# Patient Record
Sex: Female | Born: 1965
Health system: Southern US, Community
[De-identification: ages and names within clinical notes are randomized; demographics above are authoritative.]

## PROBLEM LIST (undated history)

## (undated) DIAGNOSIS — T4145XA Adverse effect of unspecified anesthetic, initial encounter: Secondary | ICD-10-CM

## (undated) DIAGNOSIS — N200 Calculus of kidney: Secondary | ICD-10-CM

## (undated) DIAGNOSIS — Z91018 Allergy to other foods: Secondary | ICD-10-CM

## (undated) DIAGNOSIS — R112 Nausea with vomiting, unspecified: Secondary | ICD-10-CM

## (undated) DIAGNOSIS — E669 Obesity, unspecified: Secondary | ICD-10-CM

## (undated) DIAGNOSIS — R011 Cardiac murmur, unspecified: Secondary | ICD-10-CM

## (undated) DIAGNOSIS — F419 Anxiety disorder, unspecified: Secondary | ICD-10-CM

## (undated) DIAGNOSIS — Z9889 Other specified postprocedural states: Secondary | ICD-10-CM

## (undated) DIAGNOSIS — K760 Fatty (change of) liver, not elsewhere classified: Secondary | ICD-10-CM

## (undated) DIAGNOSIS — T8859XA Other complications of anesthesia, initial encounter: Secondary | ICD-10-CM

## (undated) DIAGNOSIS — J45909 Unspecified asthma, uncomplicated: Secondary | ICD-10-CM

## (undated) DIAGNOSIS — T7840XA Allergy, unspecified, initial encounter: Secondary | ICD-10-CM

## (undated) DIAGNOSIS — Z9289 Personal history of other medical treatment: Secondary | ICD-10-CM

## (undated) DIAGNOSIS — Z87442 Personal history of urinary calculi: Secondary | ICD-10-CM

## (undated) DIAGNOSIS — E559 Vitamin D deficiency, unspecified: Secondary | ICD-10-CM

## (undated) DIAGNOSIS — D649 Anemia, unspecified: Secondary | ICD-10-CM

## (undated) HISTORY — DX: Obesity, unspecified: E66.9

## (undated) HISTORY — DX: Allergy, unspecified, initial encounter: T78.40XA

## (undated) HISTORY — PX: COLONOSCOPY: SHX174

## (undated) HISTORY — DX: Unspecified asthma, uncomplicated: J45.909

## (undated) HISTORY — DX: Morbid (severe) obesity due to excess calories: E66.01

## (undated) HISTORY — DX: Vitamin D deficiency, unspecified: E55.9

## (undated) HISTORY — DX: Allergy to other foods: Z91.018

## (undated) HISTORY — DX: Anxiety disorder, unspecified: F41.9

## (undated) HISTORY — DX: Anemia, unspecified: D64.9

## (undated) HISTORY — PX: POLYPECTOMY: SHX149

## (undated) HISTORY — DX: Personal history of other medical treatment: Z92.89

## (undated) HISTORY — DX: Calculus of kidney: N20.0

## (undated) HISTORY — DX: Fatty (change of) liver, not elsewhere classified: K76.0

---

## 1994-12-22 HISTORY — PX: TUBAL LIGATION: SHX77

## 2000-12-22 HISTORY — PX: OOPHORECTOMY: SHX86

## 2001-03-02 ENCOUNTER — Encounter: Admission: RE | Admit: 2001-03-02 | Discharge: 2001-05-31 | Payer: Self-pay | Admitting: Family Medicine

## 2001-09-21 ENCOUNTER — Other Ambulatory Visit: Admission: RE | Admit: 2001-09-21 | Discharge: 2001-09-21 | Payer: Self-pay | Admitting: Obstetrics and Gynecology

## 2001-11-24 ENCOUNTER — Ambulatory Visit (HOSPITAL_COMMUNITY): Admission: RE | Admit: 2001-11-24 | Discharge: 2001-11-24 | Payer: Self-pay | Admitting: Obstetrics and Gynecology

## 2001-11-24 ENCOUNTER — Encounter (INDEPENDENT_AMBULATORY_CARE_PROVIDER_SITE_OTHER): Payer: Self-pay | Admitting: Specialist

## 2003-07-11 ENCOUNTER — Other Ambulatory Visit: Admission: RE | Admit: 2003-07-11 | Discharge: 2003-07-11 | Payer: Self-pay | Admitting: Obstetrics & Gynecology

## 2003-09-29 ENCOUNTER — Encounter: Payer: Self-pay | Admitting: Emergency Medicine

## 2003-09-29 ENCOUNTER — Emergency Department (HOSPITAL_COMMUNITY): Admission: EM | Admit: 2003-09-29 | Discharge: 2003-09-29 | Payer: Self-pay | Admitting: Emergency Medicine

## 2005-12-22 DIAGNOSIS — Z9289 Personal history of other medical treatment: Secondary | ICD-10-CM

## 2005-12-22 HISTORY — DX: Personal history of other medical treatment: Z92.89

## 2007-12-29 ENCOUNTER — Other Ambulatory Visit: Admission: RE | Admit: 2007-12-29 | Discharge: 2007-12-29 | Payer: Self-pay | Admitting: Family Medicine

## 2009-04-08 ENCOUNTER — Emergency Department (HOSPITAL_COMMUNITY): Admission: EM | Admit: 2009-04-08 | Discharge: 2009-04-08 | Payer: Self-pay | Admitting: Emergency Medicine

## 2010-01-14 ENCOUNTER — Other Ambulatory Visit: Admission: RE | Admit: 2010-01-14 | Discharge: 2010-01-14 | Payer: Self-pay | Admitting: Family Medicine

## 2010-01-14 LAB — HM PAP SMEAR: HM Pap smear: NORMAL

## 2010-01-22 DIAGNOSIS — E559 Vitamin D deficiency, unspecified: Secondary | ICD-10-CM

## 2010-01-22 HISTORY — DX: Vitamin D deficiency, unspecified: E55.9

## 2011-05-09 NOTE — Op Note (Signed)
[facility]  Patient:    Vanessa Copeland, Vanessa Copeland Visit Number: [id number] MRN: [id number]          Service Type: DSU Location: [facility] Attending Physician:  Morene Antu Dictated by:   Sherry A. Rosalio Macadamia, M.D. Proc. Date: 11/24/01 Admit Date:  11/24/2001                             Operative Report  PREOPERATIVE DIAGNOSES:       1. Menorrhagia.                               2. Left ovarian cyst, dermoid.  POSTOPERATIVE DIAGNOSES:      1. Menorrhagia.                               2. Left ovarian cyst, dermoid.  PROCEDURES:                   1. Dilation and curettage.                               2. Hysteroscopy with resectoscope.                               3. Operative laparoscopy with left                                  salpingo-oophorectomy and lysis of adhesions.  SURGEON:                      Sherry A. Rosalio Macadamia, M.D.  ANESTHESIA:                   General.  INDICATIONS:                  This is a 45 year old G3, P3-0-0-3 woman who has had excessively heavy menstrual periods, causing a severe anemia.  The patient was sent by Dr. Pierce Crane for evaluation of her heavy menses.  Physical examination on September 21, 2001 revealed a nine-week-sized uterus.  Ultrasound revealed an enlarged uterus, but no distinct fibroid seen.  At the time of this ultrasound, a left dermoid cyst was noted as an incidental finding on ultrasound.  Because of the dermoid cyst and a history of menorrhagia, the patient is brought to the operating room for [facility] hysteroscopy with resectoscope and operative laparoscopy.  FINDINGS:                     A nine- to ten-week-sized, anteflexed uterus. Normal right ovary.  Left ovary with dermoid cyst present.  Fallopian tubes normal status post tubal ligation.  Normal appendix.  Multiple adhesions of the omentum to the anterior abdominal wall and in the adhesions of the anterior cul-de-sac.  DESCRIPTION OF PROCEDURE:      The patient was brought into the operating room and given adequate general anesthesia.  She was placed in the dorsal lithotomy position.  Her abdomen and then her vagina were washed with Hibiclens.  The patient was draped in a sterile fashion.  A Foley catheter was inserted into the bladder.  The patient was draped.  Pelvic examination was performed.  The surgeons gown and gloves were changed.  A speculum was placed within the vagina.  The anterior lip of the cervix was grasped with a single-tooth tenaculum.  The cervix was sounded.  The cervix was dilated with Pratt dilators to a #31.  The hysteroscope was easily introduced into the endometrial cavity.  Pictures were obtained.  Using a double-loop, right-angle resector, samples were taken circumferentially.  Adequate hemostasis was present.  There was no distinct polyp or other abnormality other than a very enlarged cavity.  Attention was then turned to laparoscopy.  The dirty drapes had been removed and the patient was draped for laparoscopy in a sterile fashion.  The surgeons gown and gloves were changed.  The subumbilical area was infiltrated with 0.25% Marcaine.  An incision was made. This was dissected down to the fascia.  The fascia was grasped with Kocher clamps.  The fascia was incised.  The edges of the fascia were then identified.  Using two 0 Vicryl sutures, figure-of-eight stitches were taken on either end of the fascia.  It was determined that this incision was inside the peritoneal cavity.  A Hasson trocar was introduced into the peritoneal cavity and it was secured in place with the 0 Vicryl sutures.  The abdominal cavity was then insufflated with carbon dioxide.  A left lateral incision was made after infiltrating with 0.25% Marcaine.  A trocar was introduced into the peritoneal cavity under direct visualization.  A suprapubic incision was made after infiltrating with Marcaine and the trocar was introduced under  direct visualization, as well.  The left fallopian tube was visualized.  The left ovary was visualized as well as all pelvic contents.  Pictures were obtained. The left ureter was identified well below the ovary and infundibulopelvic ligament.  The left fallopian tube was grasped in its isthmic ampullary portion.  It was cauterized with tripolar and then cut after double cauteries. Dissection was continued down to the ovary and was continued underneath the ovary across the infundibulopelvic ligament.  This was done using tripolar in small bites with adequate cautery and cutting.  The ovary was then separated from its attachment in this fashion.  It was placed in the cul-de-sac.  A 5 mm scope was placed in the suprapubic trocar.  The regular scope was removed.  An Endocatch was placed through the umbilical Hasson trocar.  The specimen was then placed within the Endocatch and was removed through the umbilical incision.  The bag was removed.  The bag was then opened while it was still in the abdominal cavity because it could not be removed without rupturing.  The ovarian cyst was ruptured to be able to decrease the size of the ovary.  This was ruptured well within the bag, and no contents entered the abdominal cavity. The bag, left tube and ovary were then easily removed through the umbilical incision.  The surgeons gloves were changed to decrease any risk of contamination.  The Hasson sleeve was replaced.  The original laparoscope was introduced into the abdominal cavity, removing the 5 mm scope.  The pelvis was irrigated with the Nezhat.  There was no bleeding present.  Pictures were obtained.  The upper abdomen was explored.  There were some adhesions in the anterior cul-de-sac.  These were cauterized and dissected free.  It was decided not to do any dissection of the omental adhesion, because it was felt that this was too difficult, and did not  put her at risk.  The lower trocar was  removed under direct visualization.  Adequate hemostasis was present.  The laparoscope was removed.  The Hasson sleeve was removed after all carbon  dioxide had escaped.  The fascia was then closed with the stay sutures that were figure-of-eight stitches.  These were tied.  Adequate closure of the fascia was felt to be present.  Adequate hemostasis was present.  All three incisions were then closed with 4-0 Monocryl in subcuticular stitches. Band-aids was placed over the wounds.  The Hulka tenaculum was removed from the vagina.  The patient was taken out of the dorsal lithotomy position.  She was awakened.  She was extubated.  She was moved from the operating table to a stretcher in stable condition.  COMPLICATIONS:                None.  ESTIMATED BLOOD LOSS:         Less than 5 cc.  SORBITOL DIFFERENTIAL:        -70 cc. Dictated by:   Sherry A. Rosalio Macadamia, M.D. Attending Physician:  Morene Antu DD:  11/24/01 TD:  11/24/01 Job: [phone] [id number]

## 2012-08-14 LAB — HM MAMMOGRAPHY: HM Mammogram: NORMAL

## 2012-10-06 ENCOUNTER — Institutional Professional Consult (permissible substitution): Payer: Self-pay | Admitting: Family Medicine

## 2012-10-07 ENCOUNTER — Encounter: Payer: Self-pay | Admitting: *Deleted

## 2012-10-21 ENCOUNTER — Other Ambulatory Visit: Payer: Self-pay | Admitting: Family Medicine

## 2012-10-21 ENCOUNTER — Ambulatory Visit (INDEPENDENT_AMBULATORY_CARE_PROVIDER_SITE_OTHER): Payer: BC Managed Care – PPO | Admitting: Family Medicine

## 2012-10-21 ENCOUNTER — Encounter: Payer: Self-pay | Admitting: Family Medicine

## 2012-10-21 VITALS — BP 122/82 | HR 72 | Ht 63.0 in | Wt 251.0 lb

## 2012-10-21 DIAGNOSIS — R5381 Other malaise: Secondary | ICD-10-CM

## 2012-10-21 DIAGNOSIS — R5383 Other fatigue: Secondary | ICD-10-CM

## 2012-10-21 DIAGNOSIS — E559 Vitamin D deficiency, unspecified: Secondary | ICD-10-CM

## 2012-10-21 DIAGNOSIS — D509 Iron deficiency anemia, unspecified: Secondary | ICD-10-CM

## 2012-10-21 DIAGNOSIS — Z23 Encounter for immunization: Secondary | ICD-10-CM

## 2012-10-21 DIAGNOSIS — J309 Allergic rhinitis, unspecified: Secondary | ICD-10-CM

## 2012-10-21 MED ORDER — EPINEPHRINE 0.3 MG/0.3ML IJ DEVI: 0.3000 mg | Freq: Once | INTRAMUSCULAR | Status: DC

## 2012-10-21 MED ORDER — FLUTICASONE PROPIONATE 50 MCG/ACT NA SUSP: 2.0000 | Freq: Every day | NASAL | Status: DC

## 2012-10-21 NOTE — Patient Instructions (Addendum)
Ask your spouse/children about whether or not you stop breathing at night while sleeping (and snort/startle yourself awake and start breathing again).  If this occurs, we should set you up for a sleep study.

## 2012-10-21 NOTE — Progress Notes (Signed)
Chief Complaint  Patient presents with  . get established    new pt to get established. pt is tired all the time. pt does have low iron. allergies are  giving pt a fit, has tried OTC but no relief, left foot pain for about a year and half. gave pt flu shot   Patient presents to re-establish care.  She is complaining of ongoing fatigue, a little worse over the last 1-2 months.  Allergies have been flaring.  She has tried Careers adviser, zyrtec but hasn't helped much.  Symptoms are worse first thing in the morning with a lot of congestion and sneezing.  She has been out of the flonase, and recalls it was helpful.  She would like refill.  Had labs done through her GYN earlier this year (maybe March?)--had pap smear, and had bloodwork done (iron, vitamin D).  She was supposed to be taking iron daily, but she forgets to take it.  Her periods are very heavy.  Considering uterine ablation, but she hasn't discussed this yet with her GYN.  Review of records from [place] shows last labs in 06/2010, Hgb 10.6, Vitamin D-OH 23.5  Past Medical History  Diagnosis Date  . Allergy   . Obesity, unspecified   . Asthma     mild intermittent(when allergies are flaring)  . Anemia   . Kidney stones   . H/O echocardiogram 12/2005    Trace MR, mild TR, nl L ventricular systolic function.  . Vitamin D deficiency 01/2010   Past Surgical History  Procedure Date  . Tubal ligation 1996  . Oophorectomy 2002    left due to cyst  . Cesarean section     x3   History   Social History  . Marital Status: Married    Spouse Name: N/A    Number of Children: 3  . 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 Smoking status: Never Smoker   . Smokeless tobacco: Never Used  . Alcohol Use: No  . Drug Use: No  . Sexually Active: Yes -- Female partner(s)    Birth Control/ Protection: Surgical   Other Topics Concern  . Not on file    Social History Narrative   Lives with husband, 2/3 children.  (2 daughters).  Son lives in [place].  1 dog   Family History  Problem Relation Age of Onset  . Hypertension Mother   . Anemia Mother   . Cancer Father     lung  . Kidney Stones Father   . Hypertension Father   . Asthma Brother   . Asthma Daughter   . Allergies Daughter   . Asthma Daughter   . Breast cancer Neg Hx   . Colon cancer Neg Hx   . Diabetes Neg Hx   . Heart disease Neg Hx    Meds: iron once daily, zyrtec daily Allergies  Allergen Reactions  . Shellfish Allergy Shortness Of Breath   ROS:  Denies fevers, cough, shortness of breath, headaches, dizziness, nausea, vomiting, bowel changes, urinary complaints. +heavy menses.  Denies joint pains, numbness, tingling, weakness, depression, anxiety, bleeding, bruising, skin rashes or other concerns.  PHYSICAL EXAM: BP 122/82  Pulse 72  Ht 5\' 3"  (1.6 m)  Wt 251 lb (113.853 kg)  BMI 44.46 kg/m2 Well developed, pleasant, obese female in no distress HEENT:  PERRL, EOMI, conjunctiva clear.  TM's and EAC's normal.  Nasal mucosa  with mod edema, pale, clear mucus.  OP clear.  Sinuses nontender Neck: no lymphadenopathy, thyromegaly Heart: regular rate and rhythm without murmur Lungs: clear bilaterally Back: no spine or CVA tenderness Extemities: no edema, 2+ pulse Skin: no rash Psych: normal mood, affect, hygiene and grooming  ASSESSMENT/PLAN: 1. Other malaise and fatigue  CBC with Differential, Vitamin D 25 hydroxy, Comprehensive metabolic panel, TSH  2. Need for prophylactic vaccination and inoculation against influenza  Flu vaccine greater than or equal to 3yo preservative free IM  3. Allergic rhinitis, cause unspecified  fluticasone (FLONASE) 50 MCG/ACT nasal spray  4. Unspecified vitamin D deficiency  Vitamin D 25 hydroxy  5. Iron deficiency anemia, unspecified  CBC with Differential, Ferritin, Iron   Allergies--restart flonase.  Reviewed proper use, and to  overlap with oral antihistamine.  Discussed sleep apnea. She is to ask her family if they ever hear her stop breathing.  If so, call us and we can set up a sleep study.  At this point, there are many other treatable reasons for her fatigue, but wouldn't want to miss the possibility of sleep apnea.  Anemia--iron deficient, due to heavy cycles. Reasonable to consider ablation if unable to treat adequately with iron supplementation. Check labs today

## 2012-10-22 DIAGNOSIS — J309 Allergic rhinitis, unspecified: Secondary | ICD-10-CM | POA: Insufficient documentation

## 2012-10-22 LAB — CBC WITH DIFFERENTIAL/PLATELET
Basophils Absolute: 0 10*3/uL (ref 0.0–0.1)
HCT: 31.9 % — ABNORMAL LOW (ref 36.0–46.0)
Lymphocytes Relative: 43 % (ref 12–46)
Monocytes Absolute: 0.5 10*3/uL (ref 0.1–1.0)
Neutro Abs: 3.4 10*3/uL (ref 1.7–7.7)
Neutrophils Relative %: 47 % (ref 43–77)
RDW: 16 % — ABNORMAL HIGH (ref 11.5–15.5)
WBC: 7.1 10*3/uL (ref 4.0–10.5)

## 2012-10-22 LAB — COMPREHENSIVE METABOLIC PANEL
ALT: 12 U/L (ref 0–35)
AST: 14 U/L (ref 0–37)
Albumin: 4.1 g/dL (ref 3.5–5.2)
Alkaline Phosphatase: 69 U/L (ref 39–117)
Glucose, Bld: 79 mg/dL (ref 70–99)
Potassium: 4.4 mEq/L (ref 3.5–5.3)
Sodium: 138 mEq/L (ref 135–145)
Total Protein: 7.2 g/dL (ref 6.0–8.3)

## 2012-10-22 LAB — VITAMIN D 25 HYDROXY (VIT D DEFICIENCY, FRACTURES): Vit D, 25-Hydroxy: 28 ng/mL — ABNORMAL LOW (ref 30–89)

## 2012-10-22 LAB — FOLATE: Folate: 13.8 ng/mL

## 2013-01-13 ENCOUNTER — Encounter: Payer: Self-pay | Admitting: Family Medicine

## 2013-01-13 ENCOUNTER — Ambulatory Visit (INDEPENDENT_AMBULATORY_CARE_PROVIDER_SITE_OTHER): Payer: BC Managed Care – PPO | Admitting: Family Medicine

## 2013-01-13 VITALS — BP 124/80 | HR 72 | Temp 98.0°F | Ht 63.0 in | Wt 244.0 lb

## 2013-01-13 DIAGNOSIS — R109 Unspecified abdominal pain: Secondary | ICD-10-CM

## 2013-01-13 MED ORDER — HYOSCYAMINE SULFATE 0.125 MG SL SUBL: 0.1250 mg | SUBLINGUAL_TABLET | SUBLINGUAL | Status: DC | PRN

## 2013-01-13 NOTE — Progress Notes (Signed)
Chief Complaint  Patient presents with  . Abdominal Pain    stomach cramping after eating certain foods, so painful she wants to cry. Can hear a gurgling before the pain.   HPI: About 10-15 minutes after eating, she develops abdominal pain, across her lower abdomen, described as crampy.  Pain is severe enough that sometimes she will cry. She will hear a loud noise in L abdomen, then she has a bowel movement, that is usually loose, and pain is reduced afterwards.  Takes about an hour for pain to completely resolve.  She can't pinpoint any particular food that causes it.  It happens more frequently in the evenings (after dinner), but sometimes has happened at work after lunch.  She is currently doing a 21 day fast for Church, just eating vegetables and fruit.  Hasn't been having dairy.  Stools have been a little loose since being on the fast.  Fast ends this weekend.  She reports having problems like this once a week for years, but just worse recently.  Still isn't having pain daily, but is more severe than usual.  She recalls being given an anti-spasmodic in the past that was somewhat helpful.  Past Medical History  Diagnosis Date  . Allergy   . Obesity, unspecified   . Asthma     mild intermittent(when allergies are flaring)  . Anemia   . Kidney stones   . H/O echocardiogram 12/2005    Trace MR, mild TR, nl L ventricular systolic function.  . Vitamin D deficiency 01/2010   Past Surgical History  Procedure Date  . Tubal ligation 1996  . Oophorectomy 2002    left due to cyst  . Cesarean section     x3   History   Social History  . Marital Status: Married    Spouse Name: N/A    Number of Children: 3  . 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 Smoking status: Never Smoker   . Smokeless tobacco: Never Used  . Alcohol Use: No  . Drug Use: No  . Sexually Active: Yes -- Female partner(s)     Birth Control/ Protection: Surgical   Other Topics Concern  . Not on file   Social History Narrative   Lives with husband, 2/3 children.  (2 daughters).  Son lives in [place].  1 dog    Current outpatient prescriptions:fluticasone (FLONASE) 50 MCG/ACT nasal spray, Place 2 sprays into the nose daily., Disp: 16 g, Rfl: 6;  ibuprofen (ADVIL,MOTRIN) 800 MG tablet, Take 800 mg by mouth every 8 (eight) hours as needed., Disp: , Rfl: ;  loratadine (CLARITIN) 10 MG tablet, Take 10 mg by mouth daily., Disp: , Rfl: ;  EPINEPHrine (EPI-PEN) 0.3 mg/0.3 mL DEVI, Inject 0.3 mLs (0.3 mg total) into the muscle once., Disp: 2 Device, Rfl: 0 ferrous sulfate 325 (65 FE) MG tablet, Take 325 mg by mouth daily with breakfast., Disp: , Rfl:   Allergies  Allergen Reactions  . Shellfish Allergy Shortness Of Breath   ROS:  Denies fevers, URI symptoms, chest pain, shortness of breath, nausea, vomiting, heartburn.  Denies dysuria, vaginal discharge.  No skin rashes or other concerns.  PHYSICAL EXAM: BP 124/80  Pulse 72  Temp 98 F (36.7 C)  Ht 5\' 3"  (1.6 m)  Wt 244 lb (110.678 kg)  BMI 43.22 kg/m2  LMP 01/08/2013 Well developed, slightly anxious female, in no distress  Heart: regular rate and rhythm without murmur Lungs: clear bilaterally Abdomen: soft, nontender,  Normal bowel sounds.  No hepatosplenomegaly.  Negative Murphy sign.  Completely nontender. Extremities: no edema Skin: no rashes Neuro: alert and oriented.  Cranial nerves grossly intact, normal gait  ASSESSMENT/PLAN:  1. Abdominal pain  hyoscyamine (LEVSIN/SL) 0.125 MG SL tablet    Keep food journal in order to see if there are particular triggers for pain, which can be avoided.  It doesn't appear to be related to dairy or lactose as you haven't been having that recently, but it potentially could get worse once you reintroduce it--if that occurs, than back off on the dairy again.   Pain sounds like it is related to gas, possibly worse due to  increased fruit/vegetable/fiber intake.  Try using Beano before dinner, and simethicone (Gas-X) as needed for the severe pain.  Can also re-try the anti-spasmodic since it was previously helpful.  Discussed differential diagnosis--IBS, gas.  Doubt related to gallbladder or reflux given where pain is, and normal exam. Discussed diverticulitis, and if pain persists, consider referring to GI for evaluation, and colonoscopy.  Return if worsening pain, blood in stool, fevers, or other concerns.

## 2013-01-13 NOTE — Patient Instructions (Signed)
  Keep food journal in order to see if there are particular triggers for pain, which can be avoided.  It doesn't appear to be related to dairy or lactose as you haven't been having that recently, but it potentially could get worse once you reintroduce it--if that occurs, than back off on the dairy again.   Pain sounds like it is related to gas, possibly worse due to increased fruit/vegetable/fiber intake.  Try using Beano before dinner, and simethicone (Gas-X) as needed for the severe pain.  Can also re-try the anti-spasmodic since it was previously helpful.  If pain persists, consider referring to GI for evaluation, and colonoscopy.  Return if worsening pain, blood in stool, fevers, or other concerns.

## 2013-10-26 LAB — HM MAMMOGRAPHY: HM Mammogram: NEGATIVE

## 2013-10-27 ENCOUNTER — Encounter: Payer: Self-pay | Admitting: Internal Medicine

## 2014-10-04 DIAGNOSIS — D500 Iron deficiency anemia secondary to blood loss (chronic): Secondary | ICD-10-CM | POA: Insufficient documentation

## 2014-10-23 ENCOUNTER — Encounter: Payer: Self-pay | Admitting: Family Medicine

## 2016-01-30 ENCOUNTER — Ambulatory Visit (INDEPENDENT_AMBULATORY_CARE_PROVIDER_SITE_OTHER): Payer: BLUE CROSS/BLUE SHIELD | Admitting: Family Medicine

## 2016-01-30 ENCOUNTER — Encounter: Payer: Self-pay | Admitting: Family Medicine

## 2016-01-30 VITALS — BP 122/80 | HR 68 | Temp 98.2°F | Wt 254.4 lb

## 2016-01-30 DIAGNOSIS — H04209 Unspecified epiphora, unspecified lacrimal gland: Secondary | ICD-10-CM | POA: Diagnosis not present

## 2016-01-30 DIAGNOSIS — Z7189 Other specified counseling: Secondary | ICD-10-CM | POA: Diagnosis not present

## 2016-01-30 DIAGNOSIS — Z7689 Persons encountering health services in other specified circumstances: Secondary | ICD-10-CM

## 2016-01-30 DIAGNOSIS — R067 Sneezing: Secondary | ICD-10-CM | POA: Diagnosis not present

## 2016-01-30 DIAGNOSIS — J069 Acute upper respiratory infection, unspecified: Secondary | ICD-10-CM | POA: Diagnosis not present

## 2016-01-30 NOTE — Progress Notes (Signed)
Subjective:  Vanessa Copeland is a 50 y.o. female who presents for possible sinus infection.  Symptoms include a 3 day history of scratchy throat, eye drainage, left ear itching and clogged, sinus pressure, sneezing, cough,  tender and swollen neck glands.  Denies fever, chills, body aches, nausea, vomiting. No recent antibiotic use. Has underlying allergies, seasonal. Has not been taking allergy medicine.  History of asthma, mild and intermittent. Triggers are seasonal allergies and colds usually. Has albuterol inhaler. Has not used it in a months.   Past history is significant for asthma. Patient is a non-smoker.  Using advil sinus for symptoms.  Denies sick contacts.  No other aggravating or relieving factors.  No other c/o.  ROS as in subjective   Objective: Filed Vitals:   01/30/16 1612  BP: 122/80  Pulse: 68  Temp: 98.2 F (36.8 C)    General appearance: Alert, WD/WN, no distress                             Skin: warm, no rash                           Head: no sinus tenderness,                            Eyes: conjunctiva normal, corneas clear, PERRLA                            Ears: pearly TMs, external ear canals normal                          Nose: septum midline, turbinates swollen, with erythema and clear discharge             Mouth/throat: MMM, tongue normal, mild pharyngeal erythema                           Neck: supple, no adenopathy, no thyromegaly, nontender                          Heart: RRR, normal S1, S2, no murmurs                         Lungs: CTA bilaterally, no wheezes, rales, or rhonchi      Assessment and Plan:  Acute URI  Encounter to establish care  Sneezing with watery eyes  Discussed that she appears to have an acute URI with a viral etiology at this point. Sinus infection is not possible but not high on the list of etiologies. Also suspect that her underlying allergies are making symptoms worse. Recommend that she start taking Claritin and Flonase  daily.  Can use OTC Mucinex for congestion.  Tylenol or Ibuprofen OTC for fever and malaise.  Discussed symptomatic relief, nasal saline flush, and saltwater gargles for throat discomfort . She will call or return if worse or not improving in 2-3 days.

## 2016-03-17 ENCOUNTER — Encounter: Payer: Self-pay | Admitting: Family Medicine

## 2016-03-17 ENCOUNTER — Ambulatory Visit (INDEPENDENT_AMBULATORY_CARE_PROVIDER_SITE_OTHER): Payer: BLUE CROSS/BLUE SHIELD | Admitting: Family Medicine

## 2016-03-17 VITALS — BP 122/82 | HR 64 | Temp 98.1°F | Wt 251.0 lb

## 2016-03-17 DIAGNOSIS — J309 Allergic rhinitis, unspecified: Secondary | ICD-10-CM | POA: Diagnosis not present

## 2016-03-17 DIAGNOSIS — J069 Acute upper respiratory infection, unspecified: Secondary | ICD-10-CM

## 2016-03-17 NOTE — Progress Notes (Signed)
Subjective:  Vanessa Copeland is a 50 y.o. female who presents for possible sinus infection.  Symptoms include 4 day history of scratchy throat, headache, generalized body aches, sneezing, runny nose and nasal congestion.  Denies fever, ear pain, sore throat, cough, nausea, vomiting, diarrhea.   Past history is significant for asthma- has not needed inhaler. Patient is a non-smoker.  Using Mucinex DM, claritin, Flonase for symptoms.  Denies sick contacts.  No other aggravating or relieving factors.  Reports underlying allergies. No recent antibiotic use.  Has mirena for birth control.  ROS as in subjective   Objective: Filed Vitals:   03/17/16 1113  BP: 122/82  Pulse: 64  Temp: 98.1 F (36.7 C)    General appearance: Alert, WD/WN, no distress                             Skin: warm, no rash                           Head: no sinus tenderness,                            Eyes: conjunctiva normal, corneas clear, PERRLA                            Ears: pearly TMs, external ear canals normal                          Nose: septum midline, turbinates swollen, with erythema, deep red and clear discharge             Mouth/throat: MMM, tongue normal, mild pharyngeal erythema without edema or exudate                           Neck: supple, no adenopathy, no thyromegaly, nontender                          Heart: RRR, normal S1, S2, no murmurs                         Lungs: CTA bilaterally, no wheezes, rales, or rhonchi      Assessment and Plan:   Acute URI  Allergic rhinitis, unspecified allergic rhinitis type   Discussed that her symptoms appear to be related to a viral etiology and that her allergies seem to be exacerbating this. Recommend taking daily claritin and flonase.   Can use OTC Mucinex for congestion.  Tylenol or Ibuprofen OTC for fever and malaise.  Discussed symptomatic relief, nasal saline flush, using humidifier at night. She will call or return if worse or not improving in 2-3  days. If her symptoms worsen she will call and will treat for sinusitis.

## 2016-03-17 NOTE — Patient Instructions (Addendum)
I think your symptoms are related to a viral issue and complicated by your underlying allergies. Treat your symptoms and stay well hydrated and if you're not feeling better in the next 2-3 days give me a call.  Continue treating your allergies with claritin and flonase. Use saline nasal spray and humidifier.

## 2016-06-23 ENCOUNTER — Ambulatory Visit (INDEPENDENT_AMBULATORY_CARE_PROVIDER_SITE_OTHER): Payer: BLUE CROSS/BLUE SHIELD | Admitting: Family Medicine

## 2016-06-23 ENCOUNTER — Encounter: Payer: Self-pay | Admitting: Family Medicine

## 2016-06-23 VITALS — BP 118/78 | HR 64 | Wt 245.2 lb

## 2016-06-23 DIAGNOSIS — J029 Acute pharyngitis, unspecified: Secondary | ICD-10-CM

## 2016-06-23 LAB — POCT RAPID STREP A (OFFICE): RAPID STREP A SCREEN: NEGATIVE

## 2016-06-23 MED ORDER — IBUPROFEN 800 MG PO TABS: 800.0000 mg | ORAL_TABLET | Freq: Three times a day (TID) | ORAL | Status: DC | PRN

## 2016-06-23 NOTE — Patient Instructions (Addendum)
Try using salt water gargles 3-4 times a day, taking Tylenol or ibuprofen for sore throat, stay well hydrated and if you are not turning the corner in 3-4 days give me a call.  Pharyngitis Pharyngitis is redness, pain, and swelling (inflammation) of your pharynx.  CAUSES  Pharyngitis is usually caused by infection. Most of the time, these infections are from viruses (viral) and are part of a cold. However, sometimes pharyngitis is caused by bacteria (bacterial). Pharyngitis can also be caused by allergies. Viral pharyngitis may be spread from person to person by coughing, sneezing, and personal items or utensils (cups, forks, spoons, toothbrushes). Bacterial pharyngitis may be spread from person to person by more intimate contact, such as kissing.  SIGNS AND SYMPTOMS  Symptoms of pharyngitis include:   Sore throat.   Tiredness (fatigue).   Low-grade fever.   Headache.  Joint pain and muscle aches.  Skin rashes.  Swollen lymph nodes.  Plaque-like film on throat or tonsils (often seen with bacterial pharyngitis). DIAGNOSIS  Your health care provider will ask you questions about your illness and your symptoms. Your medical history, along with a physical exam, is often all that is needed to diagnose pharyngitis. Sometimes, a rapid strep test is done. Other lab tests may also be done, depending on the suspected cause.  TREATMENT  Viral pharyngitis will usually get better in 3-4 days without the use of medicine. Bacterial pharyngitis is treated with medicines that kill germs (antibiotics).  HOME CARE INSTRUCTIONS   Drink enough water and fluids to keep your urine clear or pale yellow.   Only take over-the-counter or prescription medicines as directed by your health care provider:   If you are prescribed antibiotics, make sure you finish them even if you start to feel better.   Do not take aspirin.   Get lots of rest.   Gargle with 8 oz of salt water ( tsp of salt per 1 qt  of water) as often as every 1-2 hours to soothe your throat.   Throat lozenges (if you are not at risk for choking) or sprays may be used to soothe your throat. SEEK MEDICAL CARE IF:   You have large, tender lumps in your neck.  You have a rash.  You cough up green, yellow-brown, or bloody spit. SEEK IMMEDIATE MEDICAL CARE IF:   Your neck becomes stiff.  You drool or are unable to swallow liquids.  You vomit or are unable to keep medicines or liquids down.  You have severe pain that does not go away with the use of recommended medicines.  You have trouble breathing (not caused by a stuffy nose). MAKE SURE YOU:   Understand these instructions.  Will watch your condition.  Will get help right away if you are not doing well or get worse.   This information is not intended to replace advice given to you by your health care provider. Make sure you discuss any questions you have with your health care provider.   Document Released: 12/08/2005 Document Revised: 09/28/2013 Document Reviewed: 08/15/2013 Elsevier Interactive Patient Education [employer].

## 2016-06-23 NOTE — Progress Notes (Signed)
Subjective:  Vanessa Copeland is a 50 y.o. female who presents for evaluation of 5 day history of sore throat.  She has not had a recent close exposure to someone with proven streptococcal pharyngitis but [place] the beach with [place].  Associated symptoms include dry cough, runny nose.  History of seasonal allergies but is not taking allergy medication.  Denies fever, chills, nausea, vomiting. Does not smoke. No recent antibiotic use.   Treatment to date: theraflu and ibuprofen.  ? sick contacts.  No other aggravating or relieving factors.  No other c/o.  The following portions of the patient's history were reviewed and updated as appropriate: allergies, current medications, past medical history, past social history, past surgical history and problem list.  ROS as in subjective   Objective: Filed Vitals:   06/23/16 1542  BP: 118/78  Pulse: 64    General appearance: no distress, WD/WN, is not ill-appearing HEENT: normocephalic, conjunctiva/corneas normal, sclerae anicteric, nares patent, clear discharge, erythema and edema, pharynx with erythema, without exudate.  Oral cavity: MMM, no lesions  Neck: supple, no lymphadenopathy, no thyromegaly Heart: RRR, normal S1, S2, no murmurs Lungs: CTA bilaterally, no wheezes, rhonchi, or rales    Laboratory Strep test done. Results:negative.    Assessment and Plan: Acute pharyngitis, unspecified etiology - Plan: ibuprofen (ADVIL,MOTRIN) 800 MG tablet  Sore throat - Plan: POCT rapid strep A   Advised that symptoms and exam suggest a viral etiology.  Discussed symptomatic treatment including salt water gargles, warm fluids, rest, hydrate well, can use Tylenol or Ibuprofen for throat pain, fever, or malaise. Ibuprofen 800 mg prescribed. Start taking allergy medication again and see if allergies are playing a role. If worse or not improving within 2-3 days, call or return.

## 2016-08-01 ENCOUNTER — Encounter (HOSPITAL_COMMUNITY): Payer: Self-pay | Admitting: *Deleted

## 2016-08-01 ENCOUNTER — Ambulatory Visit (HOSPITAL_COMMUNITY)
Admission: EM | Admit: 2016-08-01 | Discharge: 2016-08-01 | Disposition: A | Discharge status: Home / Self Care | Payer: BLUE CROSS/BLUE SHIELD | Attending: Family Medicine | Admitting: Family Medicine

## 2016-08-01 DIAGNOSIS — T148XXA Other injury of unspecified body region, initial encounter: Principal | ICD-10-CM

## 2016-08-01 DIAGNOSIS — T148 Other injury of unspecified body region: Secondary | ICD-10-CM | POA: Diagnosis not present

## 2016-08-01 DIAGNOSIS — X503XXA Overexertion from repetitive movements, initial encounter: Secondary | ICD-10-CM

## 2016-08-01 MED ORDER — KETOROLAC TROMETHAMINE 60 MG/2ML IM SOLN
60.0000 mg | Freq: Once | INTRAMUSCULAR | Status: AC
  Administered 2016-08-01: 60 mg via INTRAMUSCULAR

## 2016-08-01 MED ORDER — NAPROXEN 500 MG PO TABS: 500.0000 mg | ORAL_TABLET | Freq: Two times a day (BID) | ORAL | 0 refills | Status: DC

## 2016-08-01 MED ORDER — KETOROLAC TROMETHAMINE 60 MG/2ML IM SOLN
INTRAMUSCULAR | Status: AC
  Filled 2016-08-01: qty 2

## 2016-08-01 NOTE — ED Provider Notes (Signed)
CSN: [id number]     Arrival date & time 08/01/16  1629 History   First MD Initiated Contact with Patient 08/01/16 1642     Chief Complaint  Patient presents with  . Leg Pain   (Consider location/radiation/quality/duration/timing/severity/associated sxs/prior Treatment) HPI Patient reports that she has been increasing her exercise regimen 5x per week for the last 3 months.  She has been doing interval training.  She notes that the LLE aches at night time.  She reports that sometimes she gets pain while driving.  She describes pain as a nagging, aching pain.  She reports that pain starts at her upper thigh and radiates down the side of her leg down to her calf but does not involve her foot.  She reports that pain happens with activity and at rest.  Not associated with any particular activity or movement.  No h/o injury to her back or LLE.  No numbness, tingling.  She reports some sensation of her LLE wanting to give out.  Denies falls.  She reports that she sits at work.  Has taken Motrin 800 mg at bedtime with minimal relief of pain.  No use of topicals.  Not using heat/ ice.  Past Medical History:  Diagnosis Date  . Allergy   . Anemia   . Asthma    mild intermittent(when allergies are flaring)  . H/O echocardiogram 12/2005   Trace MR, mild TR, nl L ventricular systolic function.  . Kidney stones   . Obesity, unspecified   . Vitamin D deficiency 01/2010   Past Surgical History:  Procedure Laterality Date  . CESAREAN SECTION     x3  . OOPHORECTOMY  2002   left due to cyst  . TUBAL LIGATION  1996   Family History  Problem Relation Age of Onset  . Hypertension Mother   . Anemia Mother   . Cancer Father     lung  . Kidney Stones Father   . Hypertension Father   . Asthma Brother   . Asthma Daughter   . Allergies Daughter   . Asthma Daughter   . Breast cancer Neg Hx   . Colon cancer Neg Hx   . Diabetes Neg Hx   . Heart disease Neg Hx    Social History  Substance Use Topics  .  Smoking status: Never Smoker  . Smokeless tobacco: Never Used  . Alcohol use No   OB History    Gravida Para Term Preterm AB Living   3 3       3    SAB TAB Ectopic Multiple Live Births                 Review of Systems  Musculoskeletal: Positive for arthralgias and myalgias. Negative for back pain, gait problem and joint swelling.  Neurological: Positive for weakness (reports occ buckling of LLE). Negative for numbness.    Allergies  Shellfish allergy  Home Medications   Prior to Admission medications   Medication Sig Start Date End Date Taking? Authorizing Provider  EPINEPHrine (EPI-PEN) 0.3 mg/0.3 mL DEVI Inject 0.3 mLs (0.3 mg total) into the muscle once. 10/21/12   Rita Ohara, MD  fluticasone (FLONASE) 50 MCG/ACT nasal spray Place 2 sprays into the nose daily. 10/21/12   Rita Ohara, MD  ibuprofen (ADVIL,MOTRIN) 800 MG tablet Take 1 tablet (800 mg total) by mouth every 8 (eight) hours as needed. 06/23/16   Girtha Rm, NP  loratadine (CLARITIN) 10 MG tablet Take 10 mg by mouth  daily. Reported on 01/30/2016    Historical Provider, MD  naproxen (NAPROSYN) 500 MG tablet Take 1 tablet (500 mg total) by mouth 2 (two) times daily with a meal. 08/01/16   Janora Norlander, DO   Meds Ordered and Administered this Visit   Medications  ketorolac (TORADOL) injection 60 mg (not administered)    BP 132/78 (BP Location: Right Arm)   Pulse 78   Temp 98.6 F (37 C) (Oral)   Resp 18   LMP  (Approximate)   SpO2 100%  No data found.  Physical Exam  Constitutional: She is oriented to person, place, and time. She appears well-developed and well-nourished. No distress.  obese  Neck: Normal range of motion.  Cardiovascular: Normal rate.   Pulmonary/Chest: Effort normal.  Musculoskeletal: Normal range of motion. She exhibits no edema, tenderness or deformity.  Patient has full painless ROM of spine and LE.  5/5 LE strength.  No spinal midline TTP, no paraspinal TTP.  No TTP to greater  trochanter or left IT band.  Negative straight leg raise. Negative FADIR.  Negative FABER.  Normal gait.  Neurological: She is alert and oriented to person, place, and time.  Light touch sensation LE grossly in tact.  Skin: Skin is warm and dry. Capillary refill takes less than 2 seconds.  Psychiatric: She has a normal mood and affect. Her behavior is normal.  Nursing note and vitals reviewed.   Urgent Care Course   Clinical Course    Procedures (including critical care time)  Labs Review Labs Reviewed - No data to display  Imaging Review No results found.   MDM   1. Overuse injury     Vanessa Copeland is a 50 y.o. female that presents for nonspecific LLE pain.  Physical exam was nonfocal.  She likely has an overuse injury in the setting of interval training.  Could consider a stress fracture as a DDx.  Though HPI not entirely consistent with this.  Discussed discontinuing physical activity for now.  Recommend rest, Naproxen, ICE.  Toradol IM administered in clinic.  Recommend that patient be seen by ortho as well.  Patient wishes to be seen ASAP and will attempt to be seen at [facility] during their UC hours.  Return precautions reviewed.  Meds ordered this encounter  Medications  . ketorolac (TORADOL) injection 60 mg  . naproxen (NAPROSYN) 500 MG tablet    Sig: Take 1 tablet (500 mg total) by mouth 2 (two) times daily with a meal.    Dispense:  14 tablet    Refill:  0   This patient was discussed with my attending, Dr Juventino Slovak, who agrees with my assessment and plan.  Ericia Moxley M. Lajuana Ripple, DO PGY-3, [place], DO 08/01/16 1725

## 2016-08-01 NOTE — ED Triage Notes (Signed)
Pt  Reports  She  Has  Been  excercising     For  sev  Weeks  And  She  Has  Noticed  Pain in her  l  Leg   From    Top  Of  Leg  Down  To  Bottom  With  Symptoms  Not releived  By otc meds   No   pe  Risk factors  Such  As  Chest pain or  Shortness  Of  Breath    Pt is  Sitting  Upright on the  Exam table  Speaking in  Complete  sentances    In no  Acute  Distress

## 2016-08-01 NOTE — Discharge Instructions (Signed)
I think you have an overuse injury secondary to you intense interval training.  Discontinue Motrin.  I have prescribed Naproxen to take twice daily with food instead.  I recommend that you see an orthopedist for further evaluation.

## 2016-08-12 DIAGNOSIS — M25562 Pain in left knee: Secondary | ICD-10-CM | POA: Diagnosis not present

## 2016-08-16 DIAGNOSIS — M25562 Pain in left knee: Secondary | ICD-10-CM | POA: Diagnosis not present

## 2016-08-19 DIAGNOSIS — M25562 Pain in left knee: Secondary | ICD-10-CM | POA: Diagnosis not present

## 2016-08-21 DIAGNOSIS — M25562 Pain in left knee: Secondary | ICD-10-CM | POA: Diagnosis not present

## 2016-08-21 DIAGNOSIS — M94262 Chondromalacia, left knee: Secondary | ICD-10-CM | POA: Diagnosis not present

## 2016-10-17 ENCOUNTER — Encounter (HOSPITAL_COMMUNITY): Payer: Self-pay | Admitting: Emergency Medicine

## 2016-10-17 ENCOUNTER — Ambulatory Visit (HOSPITAL_COMMUNITY)
Admission: EM | Admit: 2016-10-17 | Discharge: 2016-10-17 | Disposition: A | Discharge status: Home / Self Care | Payer: BLUE CROSS/BLUE SHIELD | Attending: Internal Medicine | Admitting: Internal Medicine

## 2016-10-17 DIAGNOSIS — R42 Dizziness and giddiness: Secondary | ICD-10-CM | POA: Diagnosis not present

## 2016-10-17 LAB — GLUCOSE, CAPILLARY: GLUCOSE-CAPILLARY: 86 mg/dL (ref 65–99)

## 2016-10-17 MED ORDER — ACETAMINOPHEN 325 MG PO TABS
ORAL_TABLET | ORAL | Status: AC
  Filled 2016-10-17: qty 3

## 2016-10-17 MED ORDER — ACETAMINOPHEN 325 MG PO TABS
975.0000 mg | ORAL_TABLET | Freq: Once | ORAL | Status: AC
  Administered 2016-10-17: 975 mg via ORAL

## 2016-10-17 MED ORDER — MECLIZINE HCL 25 MG PO TABS: 25.0000 mg | ORAL_TABLET | Freq: Three times a day (TID) | ORAL | 0 refills | Status: DC | PRN

## 2016-10-17 NOTE — ED Triage Notes (Signed)
PT reports she was doing warm ups for a gym class yesterday when (after a position change) she experienced severe dizziness. Pt reports the room was spinning and she fell over. PT has had residual room spinning sensation when she moves too quickly.

## 2016-10-17 NOTE — ED Provider Notes (Cosign Needed)
Dictation #1 [id number]  [id number] CSN: [id number]     Arrival date & time 10/17/16  1826 History   First MD Initiated Contact with Patient 10/17/16 1932     Chief Complaint  Patient presents with  . Dizziness   (Consider location/radiation/quality/duration/timing/severity/associated sxs/prior Treatment) HPI  TERRE LESAR is a 50 y.o. female presenting to UC with c/o severe dizziness that started yesterday after performing warm-ups for a gym class she has been attending 4 days a week for several weeks.  Pt states she felt like the room was spinning, she eventually lost her balance and fell over. Dizziness is still present today but not as severe. Dizziness is brought on by sudden position changes or head movements. She reports mild nausea but no vomiting. Mild generalized headache.  No prior hx of similar symptoms. No recent URI symptoms of cough or congestion. She has been eating and drinking well. Denies chest pain, SOB or palpitations.    Past Medical History:  Diagnosis Date  . Allergy   . Anemia   . Asthma    mild intermittent(when allergies are flaring)  . H/O echocardiogram 12/2005   Trace MR, mild TR, nl L ventricular systolic function.  . Kidney stones   . Obesity, unspecified   . Vitamin D deficiency 01/2010   Past Surgical History:  Procedure Laterality Date  . CESAREAN SECTION     x3  . OOPHORECTOMY  2002   left due to cyst  . TUBAL LIGATION  1996   Family History  Problem Relation Age of Onset  . Hypertension Mother   . Anemia Mother   . Cancer Father     lung  . Kidney Stones Father   . Hypertension Father   . Asthma Brother   . Asthma Daughter   . Allergies Daughter   . Asthma Daughter   . Breast cancer Neg Hx   . Colon cancer Neg Hx   . Diabetes Neg Hx   . Heart disease Neg Hx    Social History  Substance Use Topics  . Smoking status: Never Smoker  . Smokeless tobacco: Never Used  . Alcohol use No   OB History    Gravida Para Term  Preterm AB Living   3 3       3    SAB TAB Ectopic Multiple Live Births                 Review of Systems  Constitutional: Negative for chills and fever.  Eyes: Negative for photophobia, pain and visual disturbance.  Respiratory: Negative for cough, chest tightness and shortness of breath.   Cardiovascular: Negative for chest pain, palpitations and leg swelling.  Gastrointestinal: Negative for abdominal pain, diarrhea, nausea and vomiting.  Musculoskeletal: Positive for gait problem (when she stands too quickly). Negative for arthralgias, myalgias, neck pain and neck stiffness.  Skin: Negative for color change and rash.  Neurological: Positive for dizziness and headaches. Negative for syncope, weakness, light-headedness and numbness.    Allergies  Shellfish allergy  Home Medications   Prior to Admission medications   Medication Sig Start Date End Date Taking? Authorizing Provider  EPINEPHrine (EPI-PEN) 0.3 mg/0.3 mL DEVI Inject 0.3 mLs (0.3 mg total) into the muscle once. 10/21/12   Rita Ohara, MD  fluticasone (FLONASE) 50 MCG/ACT nasal spray Place 2 sprays into the nose daily. 10/21/12   Rita Ohara, MD  ibuprofen (ADVIL,MOTRIN) 800 MG tablet Take 1 tablet (800 mg total) by mouth every 8 (eight) hours as  needed. 06/23/16   Girtha Rm, NP  loratadine (CLARITIN) 10 MG tablet Take 10 mg by mouth daily. Reported on 01/30/2016    Historical Provider, MD  meclizine (ANTIVERT) 25 MG tablet Take 1 tablet (25 mg total) by mouth 3 (three) times daily as needed for dizziness. 10/17/16   Noland Fordyce, PA-C  naproxen (NAPROSYN) 500 MG tablet Take 1 tablet (500 mg total) by mouth 2 (two) times daily with a meal. 08/01/16   Janora Norlander, DO   Meds Ordered and Administered this Visit   Medications  acetaminophen (TYLENOL) tablet 975 mg (975 mg Oral Given 10/17/16 1951)    BP 139/85   Pulse 78   Temp 98.2 F (36.8 C) (Oral)   Resp 16   Ht 5\' 3"  (1.6 m)   Wt 230 lb (104.3 kg)   SpO2  100%   BMI 40.74 kg/m  No data found.   Physical Exam  Constitutional: She is oriented to person, place, and time. She appears well-developed and well-nourished. No distress.  HENT:  Head: Normocephalic and atraumatic.  Right Ear: Tympanic membrane normal.  Left Ear: Tympanic membrane normal.  Nose: Nose normal. Right sinus exhibits no maxillary sinus tenderness and no frontal sinus tenderness. Left sinus exhibits no maxillary sinus tenderness and no frontal sinus tenderness.  Mouth/Throat: Uvula is midline, oropharynx is clear and moist and mucous membranes are normal.  Eyes: Pupils are equal, round, and reactive to light. Right eye exhibits nystagmus. Left eye exhibits nystagmus.  Mild horizontal nystagmus to the right.  Neck: Normal range of motion. Neck supple.  Cardiovascular: Normal rate and regular rhythm.   Pulmonary/Chest: Effort normal. No stridor. No respiratory distress. She has no wheezes. She has no rales.  Abdominal: Soft. She exhibits no distension. There is no tenderness.  Musculoskeletal: Normal range of motion.  Lymphadenopathy:    She has no cervical adenopathy.  Neurological: She is alert and oriented to person, place, and time.  No facial droop. Symmetric smile. Speech is clear. Alert to person, place and time. Normal finger to nose coordination. Normal gait on tip-tops. Normal gait.   Skin: Skin is warm and dry. She is not diaphoretic.  Psychiatric: She has a normal mood and affect. Her behavior is normal.  Nursing note and vitals reviewed.   Urgent Care Course   Clinical Course    Procedures (including critical care time)  Labs Review Labs Reviewed  GLUCOSE, CAPILLARY    Imaging Review No results found.   MDM   1. Vertigo   2. Dizziness    Pt c/o dizziness, worse with head movement. Mild horizontal nystagmus to the Right. Normal CBG Normal orthostatic vitals.  Symptoms likely due to BPPV. Doubt SAH or CVA at this time.  No indication for  imaging at this time. Rx: Meclizine. Encouraged to rest and stay well hydrated. F/u with PCP next week if not improving. Discussed symptoms that warrant emergent care in the ED including severe headache, change in vision, passing out, one-sided, numbness or weakness, or other new concerning symptoms develop.  Patient verbalized understanding and agreement with treatment plan.      Noland Fordyce, PA-C 10/17/16 2045

## 2016-10-21 ENCOUNTER — Encounter: Payer: Self-pay | Admitting: Family Medicine

## 2016-10-21 ENCOUNTER — Encounter: Payer: Self-pay | Admitting: Gastroenterology

## 2016-10-21 ENCOUNTER — Ambulatory Visit (INDEPENDENT_AMBULATORY_CARE_PROVIDER_SITE_OTHER): Payer: BLUE CROSS/BLUE SHIELD | Admitting: Family Medicine

## 2016-10-21 VITALS — BP 150/80 | HR 70 | Resp 18 | Ht 63.0 in | Wt 233.8 lb

## 2016-10-21 DIAGNOSIS — Z23 Encounter for immunization: Secondary | ICD-10-CM

## 2016-10-21 DIAGNOSIS — Z1239 Encounter for other screening for malignant neoplasm of breast: Secondary | ICD-10-CM

## 2016-10-21 DIAGNOSIS — Z113 Encounter for screening for infections with a predominantly sexual mode of transmission: Secondary | ICD-10-CM | POA: Diagnosis not present

## 2016-10-21 DIAGNOSIS — E559 Vitamin D deficiency, unspecified: Secondary | ICD-10-CM | POA: Diagnosis not present

## 2016-10-21 DIAGNOSIS — Z13220 Encounter for screening for lipoid disorders: Secondary | ICD-10-CM

## 2016-10-21 DIAGNOSIS — Z1211 Encounter for screening for malignant neoplasm of colon: Secondary | ICD-10-CM

## 2016-10-21 DIAGNOSIS — Z1231 Encounter for screening mammogram for malignant neoplasm of breast: Secondary | ICD-10-CM

## 2016-10-21 DIAGNOSIS — Z0000 Encounter for general adult medical examination without abnormal findings: Secondary | ICD-10-CM | POA: Diagnosis not present

## 2016-10-21 DIAGNOSIS — D649 Anemia, unspecified: Secondary | ICD-10-CM | POA: Insufficient documentation

## 2016-10-21 DIAGNOSIS — Z8371 Family history of colonic polyps: Secondary | ICD-10-CM

## 2016-10-21 HISTORY — DX: Encounter for screening for infections with a predominantly sexual mode of transmission: Z11.3

## 2016-10-21 NOTE — Patient Instructions (Signed)
Please take your prescription to [place] and have them fax [place] your lab results. Take the order for mammogram to [facility].  The GI office will call you to schedule an appointment to discuss your screening colonoscopy. Continue with healthy diet and exercise. Follow-up pending labs or in 1 year.  Preventative Care for Adults - Female      MAINTAIN REGULAR HEALTH EXAMS:  A routine yearly physical is a good way to check in with your primary care provider about your health and preventive screening. It is also an opportunity to share updates about your health and any concerns you have, and receive a thorough all-over exam.   Most health insurance companies pay for at least some preventative services.  Check with your health plan for specific coverages.  WHAT PREVENTATIVE SERVICES DO WOMEN NEED?  Adult women should have their weight and blood pressure checked regularly.   Women age 31 and older should have their cholesterol levels checked regularly.  Women should be screened for cervical cancer with a Pap smear and pelvic exam beginning at either age 34, or 3 years after they become sexually activity.    Breast cancer screening generally begins at age 39 with a mammogram and breast exam by your primary care provider.    Beginning at age 61 and continuing to age 1, women should be screened for colorectal cancer.  Certain people may need continued testing until age 40.  Updating vaccinations is part of preventative care.  Vaccinations help protect against diseases such as the flu.  Osteoporosis is a disease in which the bones lose minerals and strength as we age. Women ages 71 and over should discuss this with their caregivers, as should women after menopause who have other risk factors.  Lab tests are generally done as part of preventative care to screen for anemia and blood disorders, to screen for problems with the kidneys and liver, to screen for bladder problems, to check blood sugar, and to  check your cholesterol level.  Preventative services generally include counseling about diet, exercise, avoiding tobacco, drugs, excessive alcohol consumption, and sexually transmitted infections.    GENERAL RECOMMENDATIONS FOR GOOD HEALTH:  Healthy diet:  Eat a variety of foods, including fruit, vegetables, animal or vegetable protein, such as meat, fish, chicken, and eggs, or beans, lentils, tofu, and grains, such as rice.  Drink plenty of water daily.  Decrease saturated fat in the diet, avoid lots of red meat, processed foods, sweets, fast foods, and fried foods.  Exercise:  Aerobic exercise helps maintain good heart health. At least 30-40 minutes of moderate-intensity exercise is recommended. For example, a brisk walk that increases your heart rate and breathing. This should be done on most days of the week.   Find a type of exercise or a variety of exercises that you enjoy so that it becomes a part of your daily life.  Examples are running, walking, swimming, water aerobics, and biking.  For motivation and support, explore group exercise such as aerobic class, spin class, Zumba, Yoga,or  martial arts, etc.    Set exercise goals for yourself, such as a certain weight goal, walk or run in a race such as a 5k walk/run.  Speak to your primary care provider about exercise goals.  Disease prevention:  If you smoke or chew tobacco, find out from your caregiver how to quit. It can literally save your life, no matter how long you have been a tobacco user. If you do not use tobacco, never  begin.   Maintain a healthy diet and normal weight. Increased weight leads to problems with blood pressure and diabetes.   The Body Mass Index or BMI is a way of measuring how much of your body is fat. Having a BMI above 27 increases the risk of heart disease, diabetes, hypertension, stroke and other problems related to obesity. Your caregiver can help determine your BMI and based on it develop an exercise  and dietary program to help you achieve or maintain this important measurement at a healthful level.  High blood pressure causes heart and blood vessel problems.  Persistent high blood pressure should be treated with medicine if weight loss and exercise do not work.   Fat and cholesterol leaves deposits in your arteries that can block them. This causes heart disease and vessel disease elsewhere in your body.  If your cholesterol is found to be high, or if you have heart disease or certain other medical conditions, then you may need to have your cholesterol monitored frequently and be treated with medication.   Ask if you should have a cardiac stress test if your history suggests this. A stress test is a test done on a treadmill that looks for heart disease. This test can find disease prior to there being a problem.  Menopause can be associated with physical symptoms and risks. Hormone replacement therapy is available to decrease these. You should talk to your caregiver about whether starting or continuing to take hormones is right for you.   Osteoporosis is a disease in which the bones lose minerals and strength as we age. This can result in serious bone fractures. Risk of osteoporosis can be identified using a bone density scan. Women ages 34 and over should discuss this with their caregivers, as should women after menopause who have other risk factors. Ask your caregiver whether you should be taking a calcium supplement and Vitamin D, to reduce the rate of osteoporosis.   Avoid drinking alcohol in excess (more than two drinks per day).  Avoid use of street drugs. Do not share needles with anyone. Ask for professional help if you need assistance or instructions on stopping the use of alcohol, cigarettes, and/or drugs.  Brush your teeth twice a day with fluoride toothpaste, and floss once a day. Good oral hygiene prevents tooth decay and gum disease. The problems can be painful, unattractive, and can  cause other health problems. Visit your dentist for a routine oral and dental check up and preventive care every 6-12 months.   Look at your skin regularly.  Use a mirror to look at your back. Notify your caregivers of changes in moles, especially if there are changes in shapes, colors, a size larger than a pencil eraser, an irregular border, or development of new moles.  Safety:  Use seatbelts 100% of the time, whether driving or as a passenger.  Use safety devices such as hearing protection if you work in environments with loud noise or significant background noise.  Use safety glasses when doing any work that could send debris in to the eyes.  Use a helmet if you ride a bike or motorcycle.  Use appropriate safety gear for contact sports.  Talk to your caregiver about gun safety.  Use sunscreen with a SPF (or skin protection factor) of 15 or greater.  Lighter skinned people are at a greater risk of skin cancer. Don't forget to also wear sunglasses in order to protect your eyes from too much damaging sunlight. Damaging sunlight  can accelerate cataract formation.   Practice safe sex. Use condoms. Condoms are used for birth control and to help reduce the spread of sexually transmitted infections (or STIs).  Some of the STIs are gonorrhea (the clap), chlamydia, syphilis, trichomonas, herpes, HPV (human papilloma virus) and HIV (human immunodeficiency virus) which causes AIDS. The herpes, HIV and HPV are viral illnesses that have no cure. These can result in disability, cancer and death.   Keep carbon monoxide and smoke detectors in your home functioning at all times. Change the batteries every 6 months or use a model that plugs into the wall.   Vaccinations:  Stay up to date with your tetanus shots and other required immunizations. You should have a booster for tetanus every 10 years. Be sure to get your flu shot every year, since 5%-20% of the [place] population comes down with the flu. The flu vaccine  changes each year, so being vaccinated once is not enough. Get your shot in the fall, before the flu season peaks.   Other vaccines to consider:  Human Papilloma Virus or HPV causes cancer of the cervix, and other infections that can be transmitted from person to person. There is a vaccine for HPV, and females should get immunized between the ages of 7 and 78. It requires a series of 3 shots.   Pneumococcal vaccine to protect against certain types of pneumonia.  This is normally recommended for adults age 13 or older.  However, adults younger than 49 years old with certain underlying conditions such as diabetes, heart or lung disease should also receive the vaccine.  Shingles vaccine to protect against Varicella Zoster if you are older than age 4, or younger than 49 years old with certain underlying illness.  Hepatitis A vaccine to protect against a form of infection of the liver by a virus acquired from food.  Hepatitis B vaccine to protect against a form of infection of the liver by a virus acquired from blood or body fluids, particularly if you work in health care.  If you plan to travel internationally, check with your local health department for specific vaccination recommendations.  Cancer Screening:  Breast cancer screening is essential to preventive care for women. All women age 19 and older should perform a breast self-exam every month. At age 91 and older, women should have their caregiver complete a breast exam each year. Women at ages 28 and older should have a mammogram (x-ray film) of the breasts. Your caregiver can discuss how often you need mammograms.    Cervical cancer screening includes taking a Pap smear (sample of cells examined under a microscope) from the cervix (end of the uterus). It also includes testing for HPV (Human Papilloma Virus, which can cause cervical cancer). Screening and a pelvic exam should begin at age 22, or 3 years after a woman becomes sexually active.  Screening should occur every year, with a Pap smear but no HPV testing, up to age 70. After age 47, you should have a Pap smear every 3 years with HPV testing, if no HPV was found previously.   Most routine colon cancer screening begins at the age of 42. On a yearly basis, doctors may provide special easy to use take-home tests to check for hidden blood in the stool. Sigmoidoscopy or colonoscopy can detect the earliest forms of colon cancer and is life saving. These tests use a small camera at the end of a tube to directly examine the colon. Speak to your caregiver  about this at age 32, when routine screening begins (and is repeated every 5 years unless early forms of pre-cancerous polyps or small growths are found).

## 2016-10-21 NOTE — Progress Notes (Signed)
Subjective:    Patient ID: Vanessa Copeland, female    DOB: 02/15/1966, 50 y.o.   MRN: [id number]  HPI Chief Complaint  Patient presents with  . Annual Exam    pt needs PAP- has Mirena. pt with concerns of dizziness in past week. Has been dizzy since Thursday. Seen at urgent care and reports dx with Vertigo. pt states her EPI pen is expired.    She is here for a complete physical exam. No concerns or complaints today. She was recently seen in urgent care for episode of dizziness with position changes and diagnosed with vertigo. She was prescribed meclizine states she did take 2 doses of this. Dizziness has subsided.   Last CPE: 2016 in [place].   Other providers: [facility]- for mammograms  Past medical history: vitamin D deficiency and is not currently taking vitamin D supplement. History of anemia and is not taking iron. She is aware that her BMI places her in a morbidly obese category. She has made lifestyle changes with diet and exercise. Surgeries: c-sections   Social history: Lives with husband and children, works at [employer]: has cut back sugar and carbohydrates.  Excerise: interval training  Immunizations: flu shot- today, tdap unknown will get records.   Health maintenance:  Mammogram: early 2016.  Colonoscopy: never, mother with colonic polyps  Last Gynecological Exam: 2016, mirena inserted. Never  Last Menstrual cycle: none since Mirena  Pregnancies: 3 all C-sections Last Dental Exam: annually- is overdue Last Eye Exam: annually   Wears seatbelt always, uses sunscreen, smoke detectors in home and functioning, does not text while driving and feels safe in home environment.   Reviewed allergies, medications, past medical, surgical, family, and social history.    Review of Systems Review of Systems Constitutional: -fever, -chills, -sweats, -unexpected weight change,-fatigue ENT: -runny nose, -ear pain, -sore throat Cardiology:  -chest pain, -palpitations,  -edema Respiratory: -cough, -shortness of breath, -wheezing Gastroenterology: -abdominal pain, -nausea, -vomiting, -diarrhea, -constipation  Hematology: -bleeding or bruising problems Musculoskeletal: -arthralgias, -myalgias, -joint swelling, -back pain Ophthalmology: -vision changes Urology: -dysuria, -difficulty urinating, -hematuria, -urinary frequency, -urgency Neurology: -headache, -weakness, -tingling, -numbness       Objective:   Physical Exam BP (!) 150/80   Pulse 70   Resp 18   Ht 5\' 3"  (1.6 m)   Wt 233 lb 12.8 oz (106.1 kg)   SpO2 99%   BMI 41.42 kg/m   General Appearance:    Alert, cooperative, no distress, appears stated age  Head:    Normocephalic, without obvious abnormality, atraumatic  Eyes:    PERRL, conjunctiva/corneas clear, EOM's intact, fundi    benign  Ears:    Normal TM's and external ear canals  Nose:   Nares normal, mucosa normal, no drainage or sinus   tenderness  Throat:   Lips, mucosa, and tongue normal; teeth and gums normal  Neck:   Supple, no lymphadenopathy;  thyroid:  no   enlargement/tenderness/nodules; no carotid   bruit or JVD  Back:    Spine nontender, no curvature, ROM normal, no CVA     tenderness  Lungs:     Clear to auscultation bilaterally without wheezes, rales or     ronchi; respirations unlabored  Chest Wall:    No tenderness or deformity   Heart:    Regular rate and rhythm, S1 and S2 normal, no murmur, rub   or gallop  Breast Exam:    No tenderness, masses, or nipple discharge or inversion.  No axillary lymphadenopathy  Abdomen:     Soft, non-tender, nondistended, normoactive bowel sounds,    no masses, no hepatosplenomegaly  Genitalia:    Normal external genitalia without lesions.  BUS and vagina normal; cervix without lesions, or cervical motion tenderness. No abnormal vaginal discharge.  Uterus and adnexa not enlarged, nontender, no masses.  Pap not performed, she will be due next year. Chaperone present.   Rectal:    Not  done. Referral made to GI for colonoscopy   Extremities:   No clubbing, cyanosis or edema  Pulses:   2+ and symmetric all extremities  Skin:   Skin color, texture, turgor normal, no rashes or lesions  Lymph nodes:   Cervical, supraclavicular, and axillary nodes normal  Neurologic:   CNII-XII intact, normal strength, sensation and gait; reflexes 2+ and symmetric throughout          Psych:   Normal mood, affect, hygiene and grooming.    Urinalysis dipstick: unable to do in office today due to controls out of date. This will be a send out.      Assessment & Plan:  Annual physical exam - Plan: Visual acuity screening, CANCELED: Urinalysis Dipstick, CANCELED: POCT urinalysis dipstick  Morbid obesity ([place])  Family history of colonic polyps - Plan: Ambulatory referral to Gastroenterology  Special screening for malignant neoplasms, colon - Plan: Ambulatory referral to Gastroenterology  Screening for breast cancer - Plan: MM DIGITAL SCREENING BILATERAL  Anemia, unspecified type  Vitamin D deficiency  Screening for STD (sexually transmitted disease)  Screening for lipid disorders  Need for influenza vaccination - Plan: Flu Vaccine QUAD 36+ mos IM  Overall she appears to be doing well. She has made positive lifestyle changes recently and has started exercising and eating healthy. She is aware that her BMI of >40 places her at an increased risk of developing chronic health conditions such as diabetes, HTN, etc. She will keep an eye on her blood pressure, can check this at home. She is aware that it is elevated today.  Plan to check vitamin D and CBC due to history of vitamin D deficiency and anemia. Will follow-up. HIV test done Referral made to GI for screening colonoscopy. Patient given order for mammogram and will take this to [facility]. Discussed safety and health promotion. Urinalysis dipstick controls are not valid today. Plan to send out urine for testing. Flu shot given She requests  that her labs be done at [facility]. Patient was given a prescription with lab orders and diagnosis codes to have this done. She will have them fax the results to me. Follow up pending labs or in 1 year.

## 2016-10-22 ENCOUNTER — Telehealth: Payer: Self-pay | Admitting: Family Medicine

## 2016-10-22 ENCOUNTER — Encounter: Payer: Self-pay | Admitting: Family Medicine

## 2016-10-22 DIAGNOSIS — E559 Vitamin D deficiency, unspecified: Secondary | ICD-10-CM

## 2016-10-22 MED ORDER — VITAMIN D (ERGOCALCIFEROL) 1.25 MG (50000 UNIT) PO CAPS: 50000.0000 [IU] | ORAL_CAPSULE | ORAL | 0 refills | Status: DC

## 2016-10-22 NOTE — Telephone Encounter (Signed)
Please call and let her know that her hemoglobin is now in the normal range. Her blood counts, electrolytes, thyroid, kidney and liver functions are all fine. Her cholesterol is okay but her LDL which is her bad cholesterol is creeping up and I recommend that she take as attention to her diet and eat low fat low cholesterol foods. Avoid fried food. Make sure she is getting some sort of exercise at least 150 minutes per week. Her vitamin D level is still low and I would like for her to take prescription strength vitamin D for the next 8 weeks. We will have her return in 8-10 weeks for repeat vitamin D level.

## 2016-10-22 NOTE — Telephone Encounter (Signed)
Left message for pt to call me back 

## 2016-10-22 NOTE — Telephone Encounter (Signed)
Pt was notified of results

## 2016-12-25 ENCOUNTER — Encounter: Payer: Self-pay | Admitting: Gastroenterology

## 2016-12-25 ENCOUNTER — Ambulatory Visit (INDEPENDENT_AMBULATORY_CARE_PROVIDER_SITE_OTHER): Payer: BLUE CROSS/BLUE SHIELD | Admitting: Gastroenterology

## 2016-12-25 VITALS — BP 142/90 | HR 68 | Ht 63.0 in | Wt 241.0 lb

## 2016-12-25 DIAGNOSIS — Z1211 Encounter for screening for malignant neoplasm of colon: Secondary | ICD-10-CM | POA: Diagnosis not present

## 2016-12-25 DIAGNOSIS — Z79899 Other long term (current) drug therapy: Secondary | ICD-10-CM

## 2016-12-25 MED ORDER — NA SULFATE-K SULFATE-MG SULF 17.5-3.13-1.6 GM/177ML PO SOLN: 1.0000 | Freq: Once | ORAL | 0 refills | Status: AC

## 2016-12-25 NOTE — Progress Notes (Signed)
HPI :  51 y/o female with a reported history of iron deficiency anemia due to menorrhagia, mild asthma, renal stones , overweight here for new patient evaluation for colon cancer screening.  No prior colonoscopy. No known FH of colon cancer. She denies any diarrhea or constipation at baseline. She had some symptoms concerning for irritable bowel syndrome in the past, with occasional abdominal cramps with her stools. She reports this has not bothered her for a few years. No blood in the stools. She takes ibuprofen 800mg  daily for knee pain. She takes it once daily for baseline. She doesn't use tylenol.   She does endorse a history of mild anemia with low iron levels, thought to be due to menorrhagia. Had IUD placed which stopped her menses. Last Hgb of 11.3 in October shows resolution of anemia.  Past Medical History:  Diagnosis Date  . Allergy   . Anemia   . Asthma    mild intermittent(when allergies are flaring)  . H/O echocardiogram 12/2005   Trace MR, mild TR, nl L ventricular systolic function.  . Kidney stones   . Obesity, unspecified   . Vitamin D deficiency 01/2010     Past Surgical History:  Procedure Laterality Date  . CESAREAN SECTION     x3  . OOPHORECTOMY  2002   left due to cyst  . TUBAL LIGATION  1996   Family History  Problem Relation Age of Onset  . Hypertension Mother   . Anemia Mother   . Colon polyps Mother   . Cancer Father     lung  . Kidney Stones Father   . Hypertension Father   . Lung cancer Father 96  . Asthma Brother   . Asthma Daughter   . Allergies Daughter   . Asthma Daughter   . Hypertension Brother   . Breast cancer Neg Hx   . Colon cancer Neg Hx   . Diabetes Neg Hx   . Heart disease Neg Hx    Social History  Substance Use Topics  . Smoking status: Never Smoker  . Smokeless tobacco: Never Used  . Alcohol use No   Current Outpatient Prescriptions  Medication Sig Dispense Refill  . EPINEPHrine (EPI-PEN) 0.3 mg/0.3 mL DEVI Inject  0.3 mLs (0.3 mg total) into the muscle once. 2 Device 0  . fluticasone (FLONASE) 50 MCG/ACT nasal spray Place 2 sprays into the nose daily. 16 g 6  . ibuprofen (ADVIL,MOTRIN) 800 MG tablet Take 1 tablet (800 mg total) by mouth every 8 (eight) hours as needed. 30 tablet 0  . loratadine (CLARITIN) 10 MG tablet Take 10 mg by mouth daily. Reported on 01/30/2016    . naproxen (NAPROSYN) 500 MG tablet Take 1 tablet (500 mg total) by mouth 2 (two) times daily with a meal. 14 tablet 0  . Vitamin D, Ergocalciferol, (DRISDOL) 50000 units CAPS capsule Take 1 capsule (50,000 Units total) by mouth every 7 (seven) days. 8 capsule 0   No current facility-administered medications for this visit.    Allergies  Allergen Reactions  . Shellfish Allergy Shortness Of Breath     Review of Systems: All systems reviewed and negative except where noted in HPI.   No recent labs in Epic, in care everywhere  Physical Exam: BP (!) 142/90   Pulse 68   Ht 5\' 3"  (1.6 m)   Wt 241 lb (109.3 kg)   BMI 42.69 kg/m  Constitutional: Pleasant,well-developed, female in no acute distress. HEENT: Normocephalic and atraumatic. Conjunctivae  are normal. No scleral icterus. Neck supple.  Cardiovascular: Normal rate, regular rhythm.  Pulmonary/chest: Effort normal and breath sounds normal. No wheezing, rales or rhonchi. Abdominal: Soft, protuberant, nontender. . There are no masses palpable. No hepatomegaly. Extremities: no edema Lymphadenopathy: No cervical adenopathy noted. Neurological: Alert and oriented to person place and time. Skin: Skin is warm and dry. No rashes noted. Psychiatric: Normal mood and affect. Behavior is normal.   ASSESSMENT AND PLAN: 51 year old female here for new patient visit to discuss the following issues:  Colon cancer screening - recommended optical colonoscopy. Discussed risks and benefits of anesthesia and colonoscopy and she wished proceed. Further recommendations pending results of this  exam.   High risk medication - if she is to take high-dose ibuprofen every day recommend GI prophylaxis. I discussed risks of chronic NSAID use and she for her to try Tylenol with using NSAIDs when necessary. If she is using NSAIDs frequently she can contact me to discuss GI prophylaxis.  [place] Cellar, MD [facility] Pager [phone]  CC: Girtha Rm, NP

## 2016-12-25 NOTE — Patient Instructions (Signed)
If you are age 50 or older, your body mass index should be between 23-30. Your Body mass index is 42.69 kg/m. If this is out of the aforementioned range listed, please consider follow up with your Primary Care Provider.  If you are age 25 or younger, your body mass index should be between 19-25. Your Body mass index is 42.69 kg/m. If this is out of the aformentioned range listed, please consider follow up with your Primary Care Provider.   We have sent the following medications to your pharmacy for you to pick up at your convenience:  [place] have been scheduled for a colonoscopy. Please follow written instructions given to you at your visit today.  Please pick up your prep supplies at the pharmacy within the next 1-3 days. If you use inhalers (even only as needed), please bring them with you on the day of your procedure. Your physician has requested that you go to www.startemmi.com and enter the access code given to you at your visit today. This web site gives a general overview about your procedure. However, you should still follow specific instructions given to you by our office regarding your preparation for the procedure.  Thank you.

## 2017-01-29 ENCOUNTER — Encounter: Payer: Self-pay | Admitting: Gastroenterology

## 2017-02-10 ENCOUNTER — Encounter: Payer: BLUE CROSS/BLUE SHIELD | Admitting: Gastroenterology

## 2017-03-05 DIAGNOSIS — Z1231 Encounter for screening mammogram for malignant neoplasm of breast: Secondary | ICD-10-CM | POA: Diagnosis not present

## 2017-03-05 LAB — HM MAMMOGRAPHY

## 2017-03-16 ENCOUNTER — Encounter: Payer: Self-pay | Admitting: Family Medicine

## 2017-03-25 ENCOUNTER — Ambulatory Visit (AMBULATORY_SURGERY_CENTER): Payer: BLUE CROSS/BLUE SHIELD | Admitting: Gastroenterology

## 2017-03-25 ENCOUNTER — Encounter: Payer: Self-pay | Admitting: Gastroenterology

## 2017-03-25 VITALS — BP 122/89 | HR 70 | Temp 97.8°F | Resp 24 | Ht 63.0 in | Wt 241.0 lb

## 2017-03-25 DIAGNOSIS — D128 Benign neoplasm of rectum: Secondary | ICD-10-CM

## 2017-03-25 DIAGNOSIS — Z1212 Encounter for screening for malignant neoplasm of rectum: Secondary | ICD-10-CM

## 2017-03-25 DIAGNOSIS — D125 Benign neoplasm of sigmoid colon: Secondary | ICD-10-CM | POA: Diagnosis not present

## 2017-03-25 DIAGNOSIS — D122 Benign neoplasm of ascending colon: Secondary | ICD-10-CM | POA: Diagnosis not present

## 2017-03-25 DIAGNOSIS — D123 Benign neoplasm of transverse colon: Secondary | ICD-10-CM

## 2017-03-25 DIAGNOSIS — Z1211 Encounter for screening for malignant neoplasm of colon: Secondary | ICD-10-CM | POA: Diagnosis not present

## 2017-03-25 MED ORDER — SODIUM CHLORIDE 0.9 % IV SOLN: 500.0000 mL | INTRAVENOUS | Status: DC

## 2017-03-25 NOTE — Patient Instructions (Signed)
Handout given on polyps  YOU HAD AN ENDOSCOPIC PROCEDURE TODAY: Refer to the procedure report and other information in the discharge instructions given to you for any specific questions about what was found during the examination. If this information does not answer your questions, please call [place] office at [phone] to clarify.   YOU SHOULD EXPECT: Some feelings of bloating in the abdomen. Passage of more gas than usual. Walking can help get rid of the air that was put into your GI tract during the procedure and reduce the bloating. If you had a lower endoscopy (such as a colonoscopy or flexible sigmoidoscopy) you may notice spotting of blood in your stool or on the toilet paper. Some abdominal soreness may be present for a day or two, also.  DIET: Your first meal following the procedure should be a light meal and then it is ok to progress to your normal diet. A half-sandwich or bowl of soup is an example of a good first meal. Heavy or fried foods are harder to digest and may make you feel nauseous or bloated. Drink plenty of fluids but you should avoid alcoholic beverages for 24 hours. If you had a esophageal dilation, please see attached instructions for diet.    ACTIVITY: Your care partner should take you home directly after the procedure. You should plan to take it easy, moving slowly for the rest of the day. You can resume normal activity the day after the procedure however YOU SHOULD NOT DRIVE, use power tools, machinery or perform tasks that involve climbing or major physical exertion for 24 hours (because of the sedation medicines used during the test).   SYMPTOMS TO REPORT IMMEDIATELY: A gastroenterologist can be reached at any hour. Please call [phone]  for any of the following symptoms:  Following lower endoscopy (colonoscopy, flexible sigmoidoscopy) Excessive amounts of blood in the stool  Significant tenderness, worsening of abdominal pains  Swelling of the abdomen that is  new, acute  Fever of 100 or higher    FOLLOW UP:  If any biopsies were taken you will be contacted by phone or by letter within the next 1-3 weeks. Call [phone]  if you have not heard about the biopsies in 3 weeks.  Please also call with any specific questions about appointments or follow up tests.  

## 2017-03-25 NOTE — Op Note (Signed)
[facility] Patient Name: Vanessa Copeland Procedure Date: 03/25/2017 9:03 AM MRN: [id number] Endoscopist: Remo Lipps P. Cicero Noy MD, MD Age: 51 Referring MD:  Date of Birth: [birth year] Gender: Female Account #: [id number] Procedure:                Colonoscopy Indications:              Screening for colorectal malignant neoplasm, This                            is the patient's first colonoscopy Medicines:                Monitored Anesthesia Care Procedure:                Pre-Anesthesia Assessment:                           - Prior to the procedure, a History and Physical                            was performed, and patient medications and                            allergies were reviewed. The patient's tolerance of                            previous anesthesia was also reviewed. The risks                            and benefits of the procedure and the sedation                            options and risks were discussed with the patient.                            All questions were answered, and informed consent                            was obtained. Prior Anticoagulants: The patient has                            taken no previous anticoagulant or antiplatelet                            agents. ASA Grade Assessment: III - A patient with                            severe systemic disease. After reviewing the risks                            and benefits, the patient was deemed in                            satisfactory condition to undergo the procedure.  After obtaining informed consent, the colonoscope                            was passed under direct vision. Throughout the                            procedure, the patient's blood pressure, pulse, and                            oxygen saturations were monitored continuously. The                            Model CF-HQ190L [id number]) scope was introduced                            through the anus  and advanced to the the terminal                            ileum, with identification of the appendiceal                            orifice and IC valve. The colonoscopy was performed                            without difficulty. The patient tolerated the                            procedure well. The quality of the bowel                            preparation was good. The terminal ileum, ileocecal                            valve, appendiceal orifice, and rectum were                            photographed. Scope In: 9:08:16 AM Scope Out: 9:27:28 AM Scope Withdrawal Time: 0 hours 14 minutes 56 seconds  Total Procedure Duration: 0 hours 19 minutes 12 seconds  Findings:                 The perianal and digital rectal examinations were                            normal.                           The terminal ileum appeared normal.                           A 5 mm polyp was found in the ascending colon. The                            polyp was sessile. The polyp was removed with a  cold snare. Resection and retrieval were complete.                           A 5 mm polyp was found in the transverse colon. The                            polyp was sessile. The polyp was removed with a                            cold snare. Resection and retrieval were complete.                           A 5 mm polyp was found in the sigmoid colon. The                            polyp was sessile. The polyp was removed with a                            cold snare. Resection and retrieval were complete.                           A 3 mm polyp was found in the rectum. The polyp was                            sessile. The polyp was removed with a cold snare.                            Resection and retrieval were complete.                           Internal hemorrhoids were found during retroflexion.                           The exam was otherwise without abnormality. Complications:             No immediate complications. Estimated blood loss:                            Minimal. Estimated Blood Loss:     Estimated blood loss was minimal. Impression:               - The examined portion of the ileum was normal.                           - One 5 mm polyp in the ascending colon, removed                            with a cold snare. Resected and retrieved.                           - One 5 mm polyp in the transverse colon, removed  with a cold snare. Resected and retrieved.                           - One 5 mm polyp in the sigmoid colon, removed with                            a cold snare. Resected and retrieved.                           - One 3 mm polyp in the rectum, removed with a cold                            snare. Resected and retrieved.                           - Internal hemorrhoids.                           - The examination was otherwise normal. Recommendation:           - Patient has a contact number available for                            emergencies. The signs and symptoms of potential                            delayed complications were discussed with the                            patient. Return to normal activities tomorrow.                            Written discharge instructions were provided to the                            patient.                           - Resume previous diet.                           - Continue present medications.                           - No ibuprofen, naproxen, or other non-steroidal                            anti-inflammatory drugs for 2 weeks after polyp                            removal.                           - Await pathology results.                           -  Repeat colonoscopy is recommended for                            surveillance. The colonoscopy date will be                            determined after pathology results from today's                            exam become  available for review. Remo Lipps P. Kitiara Hintze MD, MD 03/25/2017 9:33:49 AM This report has been signed electronically.

## 2017-03-25 NOTE — Progress Notes (Signed)
Called to room to assist during endoscopic procedure.  Patient ID and intended procedure confirmed with present staff. Received instructions for my participation in the procedure from the performing physician.  

## 2017-03-25 NOTE — Progress Notes (Signed)
Report to PACU, RN, vss, BBS= Clear.  

## 2017-03-26 ENCOUNTER — Telehealth: Payer: Self-pay | Admitting: *Deleted

## 2017-03-26 NOTE — Telephone Encounter (Signed)
No answer, message left for the patient. 

## 2017-03-31 ENCOUNTER — Encounter: Payer: Self-pay | Admitting: Gastroenterology

## 2017-04-10 ENCOUNTER — Encounter (HOSPITAL_COMMUNITY): Payer: Self-pay | Admitting: Emergency Medicine

## 2017-04-10 ENCOUNTER — Emergency Department (HOSPITAL_COMMUNITY)
Admission: EM | Admit: 2017-04-10 | Discharge: 2017-04-10 | Disposition: A | Discharge status: Home / Self Care | Payer: BLUE CROSS/BLUE SHIELD | Attending: Emergency Medicine | Admitting: Emergency Medicine

## 2017-04-10 DIAGNOSIS — M545 Low back pain: Secondary | ICD-10-CM | POA: Diagnosis not present

## 2017-04-10 DIAGNOSIS — Y939 Activity, unspecified: Secondary | ICD-10-CM | POA: Diagnosis not present

## 2017-04-10 DIAGNOSIS — M546 Pain in thoracic spine: Secondary | ICD-10-CM | POA: Diagnosis not present

## 2017-04-10 DIAGNOSIS — Y999 Unspecified external cause status: Secondary | ICD-10-CM | POA: Insufficient documentation

## 2017-04-10 DIAGNOSIS — J45909 Unspecified asthma, uncomplicated: Secondary | ICD-10-CM | POA: Diagnosis not present

## 2017-04-10 DIAGNOSIS — Y92410 Unspecified street and highway as the place of occurrence of the external cause: Secondary | ICD-10-CM | POA: Diagnosis not present

## 2017-04-10 DIAGNOSIS — S299XXA Unspecified injury of thorax, initial encounter: Secondary | ICD-10-CM | POA: Diagnosis present

## 2017-04-10 DIAGNOSIS — M791 Myalgia: Secondary | ICD-10-CM | POA: Insufficient documentation

## 2017-04-10 DIAGNOSIS — M7918 Myalgia, other site: Secondary | ICD-10-CM

## 2017-04-10 MED ORDER — IBUPROFEN 800 MG PO TABS: 800.0000 mg | ORAL_TABLET | Freq: Three times a day (TID) | ORAL | 0 refills | Status: DC

## 2017-04-10 MED ORDER — CYCLOBENZAPRINE HCL 10 MG PO TABS: 10.0000 mg | ORAL_TABLET | Freq: Two times a day (BID) | ORAL | 0 refills | Status: DC | PRN
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
## 2017-07-28 ENCOUNTER — Ambulatory Visit (HOSPITAL_COMMUNITY)
Admission: EM | Admit: 2017-07-28 | Discharge: 2017-07-28 | Disposition: A | Discharge status: Home / Self Care | Payer: BLUE CROSS/BLUE SHIELD | Attending: Family Medicine | Admitting: Family Medicine

## 2017-07-28 ENCOUNTER — Encounter (HOSPITAL_COMMUNITY): Payer: Self-pay | Admitting: Emergency Medicine

## 2017-07-28 DIAGNOSIS — J020 Streptococcal pharyngitis: Secondary | ICD-10-CM

## 2017-07-28 LAB — POCT RAPID STREP A: Streptococcus, Group A Screen (Direct): POSITIVE — AB

## 2017-07-28 MED ORDER — IBUPROFEN 800 MG PO TABS: 800.0000 mg | ORAL_TABLET | Freq: Three times a day (TID) | ORAL | 0 refills | Status: DC

## 2017-07-28 MED ORDER — LIDOCAINE VISCOUS 2 % MT SOLN: 15.0000 mL | OROMUCOSAL | 0 refills | Status: DC | PRN

## 2017-07-28 MED ORDER — PENICILLIN V POTASSIUM 500 MG PO TABS: 500.0000 mg | ORAL_TABLET | Freq: Two times a day (BID) | ORAL | 0 refills | Status: AC

## 2017-07-28 NOTE — ED Provider Notes (Signed)
CSN: [id number]     Arrival date & time 07/28/17  3704 History   None    Chief Complaint  Patient presents with  . Sore Throat   (Consider location/radiation/quality/duration/timing/severity/associated sxs/prior Treatment) 51 year old female with past medical history of allergies, asthma, comes in for 3 day history of sore throat. States that her grandson was taken to the pediatric ED for being sick a couple of days ago, but was told it was viral. She's been experiencing some chills and body aches. Has noted some swelling in her throat, and pain when swallowing, but no changes in voice. She was taking Motrin 800 mg, which helped, but she ran out. She has not been able to eat well due to the pain, but is able to take small sips of water. She denies fever, night sweats. Denies cough, nasal congestion, rhinorrhea, ear pain, eye pain. Denies abdominal pain, nausea, vomiting, diarrhea. Denies trouble breathing.  Denies injury to the throat.      Past Medical History:  Diagnosis Date  . Allergy   . Anemia   . Asthma    mild intermittent(when allergies are flaring)  . H/O echocardiogram 12/2005   Trace MR, mild TR, nl L ventricular systolic function.  . Kidney stones   . Obesity, unspecified   . Vitamin D deficiency 01/2010   Past Surgical History:  Procedure Laterality Date  . CESAREAN SECTION     x3  . OOPHORECTOMY  2002   left due to cyst  . TUBAL LIGATION  1996   Family History  Problem Relation Age of Onset  . Hypertension Mother   . Anemia Mother   . Colon polyps Mother   . Cancer Father        lung  . Kidney Stones Father   . Hypertension Father   . Lung cancer Father 62  . Asthma Brother   . Asthma Daughter   . Allergies Daughter   . Asthma Daughter   . Hypertension Brother   . Breast cancer Neg Hx   . Colon cancer Neg Hx   . Diabetes Neg Hx   . Heart disease Neg Hx    Social History  Substance Use Topics  . Smoking status: Never Smoker  . Smokeless tobacco:  Never Used  . Alcohol use No   OB History    Gravida Para Term Preterm AB Living   3 3       3    SAB TAB Ectopic Multiple Live Births                 Review of Systems  Reason unable to perform ROS: See HPI as above.    Allergies  Shellfish allergy  Home Medications   Prior to Admission medications   Medication Sig Start Date End Date Taking? Authorizing Provider  ibuprofen (ADVIL,MOTRIN) 800 MG tablet Take 1 tablet (800 mg total) by mouth 3 (three) times daily. 07/28/17   Tasia Catchings, Gabbie Marzo V, PA-C  lidocaine (XYLOCAINE) 2 % solution Use as directed 15 mLs in the mouth or throat as needed for mouth pain. 07/28/17   Tasia Catchings, Dairon Procter V, PA-C  penicillin v potassium (VEETID) 500 MG tablet Take 1 tablet (500 mg total) by mouth 2 (two) times daily. 07/28/17 08/07/17  Ok Edwards, PA-C   Meds Ordered and Administered this Visit  Medications - No data to display  BP (!) 163/97 (BP Location: Right Arm)   Pulse (!) 117   Temp 99.8 F (37.7 C) (Oral)  Resp 18   SpO2 98%  No data found.   Physical Exam  Constitutional: She is oriented to person, place, and time. She appears well-developed and well-nourished. No distress.  HENT:  Head: Normocephalic and atraumatic.  Right Ear: Tympanic membrane, external ear and ear canal normal. Tympanic membrane is not erythematous and not bulging.  Left Ear: Tympanic membrane, external ear and ear canal normal. Tympanic membrane is not erythematous and not bulging.  Nose: Nose normal. Right sinus exhibits no maxillary sinus tenderness and no frontal sinus tenderness. Left sinus exhibits no maxillary sinus tenderness and no frontal sinus tenderness.  Mouth/Throat: Uvula is midline and mucous membranes are normal. Posterior oropharyngeal edema and posterior oropharyngeal erythema present. No oropharyngeal exudate. No tonsillar exudate.  Eyes: Pupils are equal, round, and reactive to light. Conjunctivae are normal.  Neck: Normal range of motion. Neck supple.  Cardiovascular:  Normal rate, regular rhythm and normal heart sounds.  Exam reveals no gallop and no friction rub.   No murmur heard. Pulmonary/Chest: Effort normal and breath sounds normal. She has no decreased breath sounds. She has no wheezes. She has no rhonchi. She has no rales.  Lymphadenopathy:    She has cervical adenopathy.  Neurological: She is alert and oriented to person, place, and time.  Skin: Skin is warm and dry.  Psychiatric: She has a normal mood and affect. Her behavior is normal. Judgment normal.    Urgent Care Course     Procedures (including critical care time)  Labs Review Labs Reviewed - No data to display  Imaging Review No results found.      MDM   1. Strep pharyngitis    Rapid strep positive, start penicillin as directed. Lidocaine solution for sore throat. Ibuprofen 800 mg called in per patient's request for pain. Monitor for worsening of symptoms, trouble breathing, trouble swallowing, swelling of the throat, go to the ED for further evaluation.   Ok Edwards, PA-C 07/28/17 1054

## 2017-07-28 NOTE — ED Triage Notes (Signed)
The patient presented to the [facility] with a complaint of a sore throat x 3 days. 

## 2017-07-28 NOTE — Discharge Instructions (Signed)
Your rapid strep was positive, start penicillin as directed. Lidocaine solution for sore throat, refrain from eating or drinking 30 minutes after medicine as they can stunt your gag reflex. Discard toothbrush after 24 hours on antibiotics to prevent reinfection. Monitor for worsening of symptoms, increased swelling of the throat, trouble breathing, trouble swallowing, to go to the ED for further evaluation.

## 2017-08-13 ENCOUNTER — Emergency Department (HOSPITAL_COMMUNITY): Payer: BLUE CROSS/BLUE SHIELD

## 2017-08-13 ENCOUNTER — Emergency Department (HOSPITAL_COMMUNITY)
Admission: EM | Admit: 2017-08-13 | Discharge: 2017-08-13 | Disposition: A | Discharge status: Home / Self Care | Payer: BLUE CROSS/BLUE SHIELD | Attending: Emergency Medicine | Admitting: Emergency Medicine

## 2017-08-13 ENCOUNTER — Encounter (HOSPITAL_COMMUNITY): Payer: Self-pay | Admitting: Emergency Medicine

## 2017-08-13 DIAGNOSIS — R1031 Right lower quadrant pain: Secondary | ICD-10-CM | POA: Diagnosis not present

## 2017-08-13 DIAGNOSIS — R109 Unspecified abdominal pain: Secondary | ICD-10-CM | POA: Diagnosis not present

## 2017-08-13 DIAGNOSIS — Z79899 Other long term (current) drug therapy: Secondary | ICD-10-CM | POA: Insufficient documentation

## 2017-08-13 DIAGNOSIS — J45909 Unspecified asthma, uncomplicated: Secondary | ICD-10-CM | POA: Insufficient documentation

## 2017-08-13 LAB — COMPREHENSIVE METABOLIC PANEL
ALBUMIN: 3.5 g/dL (ref 3.5–5.0)
ALT: 17 U/L (ref 14–54)
AST: 19 U/L (ref 15–41)
Alkaline Phosphatase: 78 U/L (ref 38–126)
Anion gap: 7 (ref 5–15)
BUN: 14 mg/dL (ref 6–20)
CHLORIDE: 105 mmol/L (ref 101–111)
CO2: 26 mmol/L (ref 22–32)
CREATININE: 0.76 mg/dL (ref 0.44–1.00)
Calcium: 8.9 mg/dL (ref 8.9–10.3)
GFR calc Af Amer: >60 mL/min (ref >60)
GFR calc non Af Amer: >60 mL/min (ref >60)
GLUCOSE: 123 mg/dL — AB (ref 65–99)
Potassium: 3.3 mmol/L — ABNORMAL LOW (ref 3.5–5.1)
SODIUM: 138 mmol/L (ref 135–145)
Total Bilirubin: 0.6 mg/dL (ref 0.3–1.2)
Total Protein: 7.5 g/dL (ref 6.5–8.1)

## 2017-08-13 LAB — CBC
HCT: 31.8 % — ABNORMAL LOW (ref 36.0–46.0)
Hemoglobin: 10.7 g/dL — ABNORMAL LOW (ref 12.0–15.0)
MCH: 28.7 pg (ref 26.0–34.0)
MCHC: 33.6 g/dL (ref 30.0–36.0)
MCV: 85.3 fL (ref 78.0–100.0)
Platelets: 369 10*3/uL (ref 150–400)
RBC: 3.73 MIL/uL — AB (ref 3.87–5.11)
RDW: 15.2 % (ref 11.5–15.5)
WBC: 9.1 10*3/uL (ref 4.0–10.5)

## 2017-08-13 LAB — URINALYSIS, ROUTINE W REFLEX MICROSCOPIC
BACTERIA UA: NONE SEEN
Bilirubin Urine: NEGATIVE
GLUCOSE, UA: NEGATIVE mg/dL
Ketones, ur: NEGATIVE mg/dL
LEUKOCYTES UA: NEGATIVE
NITRITE: NEGATIVE
PH: 5 (ref 5.0–8.0)
PROTEIN: NEGATIVE mg/dL
Specific Gravity, Urine: 1.021 (ref 1.005–1.030)

## 2017-08-13 LAB — LIPASE, BLOOD: LIPASE: 22 U/L (ref 11–51)

## 2017-08-13 LAB — PREGNANCY, URINE: Preg Test, Ur: NEGATIVE

## 2017-08-13 MED ORDER — IOPAMIDOL (ISOVUE-300) INJECTION 61%
INTRAVENOUS | Status: AC
  Administered 2017-08-13: 100 mL
  Filled 2017-08-13: qty 100

## 2017-08-13 MED ORDER — OXYCODONE-ACETAMINOPHEN 5-325 MG PO TABS: 2.0000 | ORAL_TABLET | ORAL | 0 refills | Status: DC | PRN

## 2017-08-13 MED ORDER — ONDANSETRON 4 MG PO TBDP: 4.0000 mg | ORAL_TABLET | Freq: Three times a day (TID) | ORAL | 0 refills | Status: DC | PRN

## 2017-08-13 NOTE — ED Triage Notes (Signed)
Pt c/o sharp right sided abdominal pain that woke her from sleep at 0100, pt states she vomited and had a bowel movement. Denies diarrhea. Pain improved but still present.

## 2017-08-13 NOTE — ED Provider Notes (Signed)
[facility] DEPT Provider Note   CSN: [id number] Arrival date & time: 08/13/17  0302     History   Chief Complaint Chief Complaint  Patient presents with  . Abdominal Pain    HPI Vanessa Copeland is a 51 y.o. female who presents with RLQ abdominal pain. PMH significant for IBS, hx of kidney stones, asthma, anemia. She states that she woke up from the pain at about 1AM and had the urge to go to the bathroom. She went on the toilet and has multiple episodes of non-bloody diarrhea. She sat on the toilet for about an hour and the pain persisted and worsened. The pain became a "doubling over pain" and she then had diaphoresis and an episode of vomiting. The pain has slowly subsided and now is an achy dull pain in the RLQ. She states  She denies fever, chills, chest pain, SOB, current nausea, flank pain, urinary symptoms, bloody urine, vaginal discharge or bleeding. She has a colonoscopy which was remarkable for polyps. She has the Mirena for birth control. Past surgical hx significant for 3 C-sections.  HPI  Past Medical History:  Diagnosis Date  . Allergy   . Anemia   . Asthma    mild intermittent(when allergies are flaring)  . H/O echocardiogram 12/2005   Trace MR, mild TR, nl L ventricular systolic function.  . Kidney stones   . Obesity, unspecified   . Vitamin D deficiency 01/2010    Patient Active Problem List   Diagnosis Date Noted  . Vitamin D deficiency 10/21/2016  . Anemia 10/21/2016  . Screening for STD (sexually transmitted disease) 10/21/2016  . Allergic rhinitis 10/22/2012    Past Surgical History:  Procedure Laterality Date  . CESAREAN SECTION     x3  . OOPHORECTOMY  2002   left due to cyst  . TUBAL LIGATION  1996    OB History    Gravida Para Term Preterm AB Living   3 3       3    SAB TAB Ectopic Multiple Live Births                   Home Medications    Prior to Admission medications   Medication Sig Start Date End Date Taking? Authorizing  Provider  ibuprofen (ADVIL,MOTRIN) 800 MG tablet Take 1 tablet (800 mg total) by mouth 3 (three) times daily. 07/28/17   Tasia Catchings, Amy V, PA-C  lidocaine (XYLOCAINE) 2 % solution Use as directed 15 mLs in the mouth or throat as needed for mouth pain. 07/28/17   Ok Edwards, PA-C    Family History Family History  Problem Relation Age of Onset  . Hypertension Mother   . Anemia Mother   . Colon polyps Mother   . Cancer Father        lung  . Kidney Stones Father   . Hypertension Father   . Lung cancer Father 27  . Asthma Brother   . Asthma Daughter   . Allergies Daughter   . Asthma Daughter   . Hypertension Brother   . Breast cancer Neg Hx   . Colon cancer Neg Hx   . Diabetes Neg Hx   . Heart disease Neg Hx     Social History Social History  Substance Use Topics  . Smoking status: Never Smoker  . Smokeless tobacco: Never Used  . Alcohol use No     Allergies   Shellfish allergy   Review of Systems Review of Systems  Constitutional: Positive for diaphoresis. Negative for chills and fever.  Respiratory: Negative for shortness of breath.   Cardiovascular: Negative for chest pain.  Gastrointestinal: Positive for abdominal pain, diarrhea, nausea and vomiting. Negative for blood in stool.  Genitourinary: Negative for difficulty urinating, dysuria, flank pain, frequency, hematuria, pelvic pain, vaginal bleeding and vaginal discharge.     Physical Exam Updated Vital Signs BP (!) 148/91 (BP Location: Right Arm)   Pulse 81   Temp 98 F (36.7 C) (Oral)   Resp 16   Ht 5\' 3"  (1.6 m)   Wt 104.3 kg (230 lb)   SpO2 94%   BMI 40.74 kg/m   Physical Exam  Constitutional: She is oriented to person, place, and time. She appears well-developed and well-nourished. No distress.  HENT:  Head: Normocephalic and atraumatic.  Eyes: Pupils are equal, round, and reactive to light. Conjunctivae are normal. Right eye exhibits no discharge. Left eye exhibits no discharge. No scleral icterus.  Neck:  Normal range of motion.  Cardiovascular: Normal rate and regular rhythm.  Exam reveals no gallop and no friction rub.   No murmur heard. Pulmonary/Chest: Effort normal and breath sounds normal. No respiratory distress. She has no wheezes. She has no rales. She exhibits no tenderness.  Abdominal: Soft. Bowel sounds are normal. She exhibits no distension and no mass. There is no tenderness. There is no rebound and no guarding. No hernia.  No CVA tenderness  Neurological: She is alert and oriented to person, place, and time.  Skin: Skin is warm and dry.  Psychiatric: She has a normal mood and affect. Her behavior is normal.  Nursing note and vitals reviewed.    ED Treatments / Results  Labs (all labs ordered are listed, but only abnormal results are displayed) Labs Reviewed  COMPREHENSIVE METABOLIC PANEL - Abnormal; Notable for the following:       Result Value   Potassium 3.3 (*)    Glucose, Bld 123 (*)    All other components within normal limits  CBC - Abnormal; Notable for the following:    RBC 3.73 (*)    Hemoglobin 10.7 (*)    HCT 31.8 (*)    All other components within normal limits  URINALYSIS, ROUTINE W REFLEX MICROSCOPIC - Abnormal; Notable for the following:    Color, Urine AMBER (*)    APPearance HAZY (*)    Hgb urine dipstick MODERATE (*)    Squamous Epithelial / LPF 0-5 (*)    All other components within normal limits  LIPASE, BLOOD  PREGNANCY, URINE    EKG  EKG Interpretation None       Radiology Ct Abdomen Pelvis W Contrast  Result Date: 08/13/2017 CLINICAL DATA:  Low abdominal pain EXAM: CT ABDOMEN AND PELVIS WITH CONTRAST TECHNIQUE: Multidetector CT imaging of the abdomen and pelvis was performed using the standard protocol following bolus administration of intravenous contrast. CONTRAST:  164mL ISOVUE-300 IOPAMIDOL (ISOVUE-300) INJECTION 61% COMPARISON:  01/05/2009 FINDINGS: Lower chest: Mild right basilar atelectasis is noted. No focal confluent  infiltrate or sizable effusion is seen. Hepatobiliary: No focal liver abnormality is seen. No gallstones, gallbladder wall thickening, or biliary dilatation. Pancreas: Unremarkable. No pancreatic ductal dilatation or surrounding inflammatory changes. Spleen: Normal in size without focal abnormality. Adrenals/Urinary Tract: The kidneys are well visualized bilaterally. No obstructive changes are seen. Bilateral renal calculi are noted. The largest of these lie on the left measuring approximately 6 mm in the lower pole. Smaller stones are noted on the right. The bladder  is well distended. The adrenal glands are within normal limits. Stomach/Bowel: The appendix is not well visualized although no inflammatory changes to suggest appendicitis are noted. No obstructive changes are seen. Vascular/Lymphatic: No significant vascular findings are present. No enlarged abdominal or pelvic lymph nodes. Reproductive: Uterus and bilateral adnexa are unremarkable. An IUD is noted in place. Other: No abdominal wall hernia or abnormality. No abdominopelvic ascites. Musculoskeletal: No acute or significant osseous findings. IMPRESSION: Bilateral renal calculi without obstructive change. Mild right basilar atelectasis. Electronically Signed   By: Inez Catalina M.D.   On: 08/13/2017 11:33    Procedures Procedures (including critical care time)  Medications Ordered in ED Medications - No data to display   Initial Impression / Assessment and Plan / ED Course  I have reviewed the triage vital signs and the nursing notes.  Pertinent labs & imaging results that were available during my care of the patient were reviewed by me and considered in my medical decision making (see chart for details).  77:14AM 51 year old female with severe R sided abdominal pain since this morning. It has since subsided and abdomen is soft and non-tender. She is hypertensive but otherwise vitals are normal. CBC remarkable for anemia. CMP remarkable for  mild hypokalemia and hyperglycemia. UA shows moderate hgb, Ca oxalate crystals, 6-30 RBC. I discussed with her that her pain was most likely due to a kidney stone. She is very worried about appendicitis and wants a scan. CT abdomen and pelvis was ordered.  12:20 PM CT is remarkable for bilateral non-obstructing renal stones. Likely she passed a stone although she is still having some colicky pain still. Will d/c with percocet and zofran. Return precautions given.    Final Clinical Impressions(s) / ED Diagnoses   Final diagnoses:  Right lower quadrant abdominal pain    New Prescriptions New Prescriptions   No medications on file     Iris Pert 08/13/17 1221    Tegeler, Gwenyth Allegra, MD 08/14/17 1228

## 2017-08-13 NOTE — ED Notes (Signed)
Spoke with lab, they are going to add the pregnancy test to the urine.

## 2017-08-13 NOTE — ED Notes (Signed)
Pt ambulated to restroom without difficulty

## 2017-08-13 NOTE — ED Notes (Signed)
Claiborne Billings PA aware of pts shellfish allergy

## 2017-08-13 NOTE — Discharge Instructions (Signed)
Take pain medicine for severe pain Take Zofran for nausea Drink plenty of fluids Return for worsening symptoms

## 2017-08-13 NOTE — ED Notes (Signed)
Spoke with Shelia in lab, requested that we send requisition down to add pregnancy to urine already in lab.

## 2017-10-14 DIAGNOSIS — H109 Unspecified conjunctivitis: Secondary | ICD-10-CM | POA: Diagnosis not present

## 2017-10-18 ENCOUNTER — Ambulatory Visit (HOSPITAL_COMMUNITY)
Admission: EM | Admit: 2017-10-18 | Discharge: 2017-10-18 | Disposition: A | Discharge status: Home / Self Care | Payer: BLUE CROSS/BLUE SHIELD | Attending: Internal Medicine | Admitting: Internal Medicine

## 2017-10-18 ENCOUNTER — Encounter (HOSPITAL_COMMUNITY): Payer: Self-pay | Admitting: Emergency Medicine

## 2017-10-18 DIAGNOSIS — B309 Viral conjunctivitis, unspecified: Secondary | ICD-10-CM

## 2017-10-18 DIAGNOSIS — H6691 Otitis media, unspecified, right ear: Secondary | ICD-10-CM | POA: Diagnosis not present

## 2017-10-18 DIAGNOSIS — J069 Acute upper respiratory infection, unspecified: Secondary | ICD-10-CM

## 2017-10-18 MED ORDER — FLUTICASONE PROPIONATE 50 MCG/ACT NA SUSP: 2.0000 | Freq: Every day | NASAL | 2 refills | Status: DC

## 2017-10-18 MED ORDER — AMOXICILLIN 500 MG PO CAPS: 500.0000 mg | ORAL_CAPSULE | Freq: Three times a day (TID) | ORAL | 0 refills | Status: DC

## 2017-10-18 NOTE — ED Triage Notes (Signed)
Pt sts right eye redness and irritation; pt sts some URI sx as well; pt sts taking drops for pink eye without relief

## 2017-10-18 NOTE — Discharge Instructions (Signed)
°  Avoid wearing your contacts and using eye makeup for at least 24 hours after symptoms have resolved.  Then use new eye makeup and new contacts to make sure you do not re-infect yourself.

## 2017-10-18 NOTE — ED Provider Notes (Signed)
[facility]    CSN: [id number] Arrival date & time: 10/18/17  1257     History   Chief Complaint Chief Complaint  Patient presents with  . Conjunctivitis    HPI Vanessa Copeland is a 51 y.o. female.   HPI  Vanessa Copeland is a 51 y.o. female presenting to UC with c/o worsening Right eye irritation and redness along with Left eye irritation, nasal congestion, Right ear pain, and mildly productive cough.  She was seen by a medical provider last week and was prescribed gentamicin eye drops but does not believe her eye symptoms are improving much.  She does wear contacts but has been wearing glasses since symptoms started. Denies fever, chills, n/v/d.   Past Medical History:  Diagnosis Date  . Allergy   . Anemia   . Asthma    mild intermittent(when allergies are flaring)  . H/O echocardiogram 12/2005   Trace MR, mild TR, nl L ventricular systolic function.  . Kidney stones   . Obesity, unspecified   . Vitamin D deficiency 01/2010    Patient Active Problem List   Diagnosis Date Noted  . Vitamin D deficiency 10/21/2016  . Anemia 10/21/2016  . Screening for STD (sexually transmitted disease) 10/21/2016  . Allergic rhinitis 10/22/2012    Past Surgical History:  Procedure Laterality Date  . CESAREAN SECTION     x3  . OOPHORECTOMY  2002   left due to cyst  . TUBAL LIGATION  1996    OB History    Gravida Para Term Preterm AB Living   3 3       3    SAB TAB Ectopic Multiple Live Births                   Home Medications    Prior to Admission medications   Medication Sig Start Date End Date Taking? Authorizing Provider  amoxicillin (AMOXIL) 500 MG capsule Take 1 capsule (500 mg total) by mouth 3 (three) times daily. 10/18/17   Noe Gens, PA-C  fluticasone (FLONASE) 50 MCG/ACT nasal spray Place 2 sprays into both nostrils daily. 10/18/17   Noe Gens, PA-C  ibuprofen (ADVIL,MOTRIN) 800 MG tablet Take 1 tablet (800 mg total) by mouth 3 (three) times  daily. 07/28/17   Tasia Catchings, Amy V, PA-C  lidocaine (XYLOCAINE) 2 % solution Use as directed 15 mLs in the mouth or throat as needed for mouth pain. Patient not taking: Reported on 08/13/2017 07/28/17   Ok Edwards, PA-C  ondansetron (ZOFRAN ODT) 4 MG disintegrating tablet Take 1 tablet (4 mg total) by mouth every 8 (eight) hours as needed for nausea or vomiting. 08/13/17   Recardo Evangelist, PA-C  oxyCODONE-acetaminophen (PERCOCET/ROXICET) 5-325 MG tablet Take 2 tablets by mouth every 4 (four) hours as needed for severe pain. 08/13/17   Recardo Evangelist, PA-C    Family History Family History  Problem Relation Age of Onset  . Hypertension Mother   . Anemia Mother   . Colon polyps Mother   . Cancer Father        lung  . Kidney Stones Father   . Hypertension Father   . Lung cancer Father 8  . Asthma Brother   . Asthma Daughter   . Allergies Daughter   . Asthma Daughter   . Hypertension Brother   . Breast cancer Neg Hx   . Colon cancer Neg Hx   . Diabetes Neg Hx   . Heart disease  Neg Hx     Social History Social History  Substance Use Topics  . Smoking status: Never Smoker  . Smokeless tobacco: Never Used  . Alcohol use No     Allergies   Shellfish allergy   Review of Systems Review of Systems  Constitutional: Negative for chills and fever.  HENT: Positive for congestion, ear pain (Right), postnasal drip, rhinorrhea and sore throat. Negative for trouble swallowing and voice change.   Eyes: Positive for pain, discharge, redness and itching. Negative for photophobia and visual disturbance.  Respiratory: Positive for cough. Negative for shortness of breath.   Cardiovascular: Negative for chest pain and palpitations.  Gastrointestinal: Negative for abdominal pain, diarrhea, nausea and vomiting.  Musculoskeletal: Negative for arthralgias, back pain and myalgias.  Skin: Negative for rash.  Neurological: Negative for dizziness, light-headedness and headaches.     Physical  Exam Triage Vital Signs ED Triage Vitals  Enc Vitals Group     BP 10/18/17 1414 (!) 145/95     Pulse Rate 10/18/17 1414 87     Resp 10/18/17 1414 18     Temp 10/18/17 1414 (!) 97.2 F (36.2 C)     Temp Source 10/18/17 1414 Oral     SpO2 10/18/17 1414 97 %     Weight --      Height --      Head Circumference --      Peak Flow --      Pain Score 10/18/17 1415 0     Pain Loc --      Pain Edu? --      Excl. in [place]? --    No data found.   Updated Vital Signs BP (!) 145/95 (BP Location: Right Arm)   Pulse 87   Temp (!) 97.2 F (36.2 C) (Oral)   Resp 18   SpO2 97%   Visual Acuity Right Eye Distance:   Left Eye Distance:   Bilateral Distance:    Right Eye Near:   Left Eye Near:    Bilateral Near:     Physical Exam  Constitutional: She is oriented to person, place, and time. She appears well-developed and well-nourished. No distress.  HENT:  Head: Normocephalic and atraumatic.  Right Ear: Tympanic membrane is erythematous. Tympanic membrane is not bulging. A middle ear effusion is present.  Left Ear: Tympanic membrane normal.  Nose: Mucosal edema present. Right sinus exhibits no maxillary sinus tenderness and no frontal sinus tenderness. Left sinus exhibits no maxillary sinus tenderness and no frontal sinus tenderness.  Mouth/Throat: Uvula is midline, oropharynx is clear and moist and mucous membranes are normal.  Eyes: Pupils are equal, round, and reactive to light. EOM and lids are normal. Right eye exhibits chemosis. Right eye exhibits no discharge. Left eye exhibits no chemosis and no discharge. Right conjunctiva is injected. Left conjunctiva is injected.  Neck: Normal range of motion. Neck supple.  Cardiovascular: Normal rate and regular rhythm.   Pulmonary/Chest: Effort normal and breath sounds normal. No stridor. No respiratory distress. She has no wheezes. She has no rales.  Musculoskeletal: Normal range of motion.  Lymphadenopathy:    She has no cervical  adenopathy.  Neurological: She is alert and oriented to person, place, and time.  Skin: Skin is warm and dry. She is not diaphoretic.  Psychiatric: She has a normal mood and affect. Her behavior is normal.  Nursing note and vitals reviewed.    UC Treatments / Results  Labs (all labs ordered are listed, but only  abnormal results are displayed) Labs Reviewed - No data to display  EKG  EKG Interpretation None       Radiology No results found.  Procedures Procedures (including critical care time)  Medications Ordered in UC Medications - No data to display   Initial Impression / Assessment and Plan / UC Course  I have reviewed the triage vital signs and the nursing notes.  Pertinent labs & imaging results that were available during my care of the patient were reviewed by me and considered in my medical decision making (see chart for details).     Hx and exam c/w Right AOM Pt already on gentamicin ophthalmic drops, may continue to use Will start on Flonase and Amoxicillin F/u with PCP in 4-5 days if eye symptoms not improving Refrain from using contacts or eye makeup for at least 24 hours past resolution of symptoms.   Final Clinical Impressions(s) / UC Diagnoses   Final diagnoses:  Right acute otitis media  Acute upper respiratory infection  Acute viral conjunctivitis of both eyes    New Prescriptions Discharge Medication List as of 10/18/2017  2:34 PM    START taking these medications   Details  amoxicillin (AMOXIL) 500 MG capsule Take 1 capsule (500 mg total) by mouth 3 (three) times daily., Starting Sun 10/18/2017, Normal    fluticasone (FLONASE) 50 MCG/ACT nasal spray Place 2 sprays into both nostrils daily., Starting Sun 10/18/2017, Normal         Controlled Substance Prescriptions [place] Controlled Substance Registry consulted? Not Applicable   Tyrell Antonio 10/18/17 1544

## 2017-12-14 DIAGNOSIS — G4733 Obstructive sleep apnea (adult) (pediatric): Secondary | ICD-10-CM | POA: Insufficient documentation

## 2017-12-14 DIAGNOSIS — R591 Generalized enlarged lymph nodes: Secondary | ICD-10-CM | POA: Diagnosis not present

## 2017-12-22 HISTORY — PX: LUMBAR DISC SURGERY: SHX700

## 2018-04-24 ENCOUNTER — Encounter (HOSPITAL_COMMUNITY): Payer: Self-pay | Admitting: Emergency Medicine

## 2018-04-24 ENCOUNTER — Ambulatory Visit (HOSPITAL_COMMUNITY)
Admission: EM | Admit: 2018-04-24 | Discharge: 2018-04-24 | Disposition: A | Discharge status: Home / Self Care | Payer: BLUE CROSS/BLUE SHIELD | Attending: Radiology | Admitting: Radiology

## 2018-04-24 ENCOUNTER — Other Ambulatory Visit: Payer: Self-pay

## 2018-04-24 DIAGNOSIS — H6591 Unspecified nonsuppurative otitis media, right ear: Secondary | ICD-10-CM

## 2018-04-24 MED ORDER — LORATADINE-PSEUDOEPHEDRINE ER 5-120 MG PO TB12: 1.0000 | ORAL_TABLET | Freq: Every day | ORAL | 0 refills | Status: AC

## 2018-04-24 MED ORDER — IBUPROFEN 800 MG PO TABS: 800.0000 mg | ORAL_TABLET | Freq: Two times a day (BID) | ORAL | 0 refills | Status: DC

## 2018-04-24 MED ORDER — AMOXICILLIN 500 MG PO CAPS: 500.0000 mg | ORAL_CAPSULE | Freq: Three times a day (TID) | ORAL | 0 refills | Status: AC

## 2018-04-24 NOTE — ED Triage Notes (Signed)
Right ear pain, sore throat for a week.  Patient is coughing, slight yellow phlegm.

## 2018-04-24 NOTE — ED Provider Notes (Signed)
[facility]    CSN: [id number] Arrival date & time: 04/24/18  1641     History   Chief Complaint Chief Complaint  Patient presents with  . Otalgia    HPI Vanessa Copeland is a 52 y.o. female.   52 y.o. female presents with right ear pain  X 1 week. Patient states that initally she had a sore throat that manifested into ear pain and cough. Condition is acute  in nature. Condition is made better by nothing. Condition is made worse by nothing. Patient denies any treatment prior to there arrival at this facility. Patient denies any fevers or discharge      Past Medical History:  Diagnosis Date  . Allergy   . Anemia   . Asthma    mild intermittent(when allergies are flaring)  . H/O echocardiogram 12/2005   Trace MR, mild TR, nl L ventricular systolic function.  . Kidney stones   . Obesity, unspecified   . Vitamin D deficiency 01/2010    Patient Active Problem List   Diagnosis Date Noted  . Vitamin D deficiency 10/21/2016  . Anemia 10/21/2016  . Screening for STD (sexually transmitted disease) 10/21/2016  . Allergic rhinitis 10/22/2012    Past Surgical History:  Procedure Laterality Date  . CESAREAN SECTION     x3  . OOPHORECTOMY  2002   left due to cyst  . TUBAL LIGATION  1996    OB History    Gravida  3   Para  3   Term      Preterm      AB      Living  3     SAB      TAB      Ectopic      Multiple      Live Births               Home Medications    Prior to Admission medications   Medication Sig Start Date End Date Taking? Authorizing Provider  amoxicillin (AMOXIL) 500 MG capsule Take 1 capsule (500 mg total) by mouth 3 (three) times daily. 10/18/17   Noe Gens, PA-C  fluticasone (FLONASE) 50 MCG/ACT nasal spray Place 2 sprays into both nostrils daily. 10/18/17   Noe Gens, PA-C  ibuprofen (ADVIL,MOTRIN) 800 MG tablet Take 1 tablet (800 mg total) by mouth 3 (three) times daily. 07/28/17   Tasia Catchings, Amy V, PA-C  lidocaine  (XYLOCAINE) 2 % solution Use as directed 15 mLs in the mouth or throat as needed for mouth pain. Patient not taking: Reported on 08/13/2017 07/28/17   Ok Edwards, PA-C  ondansetron (ZOFRAN ODT) 4 MG disintegrating tablet Take 1 tablet (4 mg total) by mouth every 8 (eight) hours as needed for nausea or vomiting. 08/13/17   Recardo Evangelist, PA-C  oxyCODONE-acetaminophen (PERCOCET/ROXICET) 5-325 MG tablet Take 2 tablets by mouth every 4 (four) hours as needed for severe pain. 08/13/17   Recardo Evangelist, PA-C    Family History Family History  Problem Relation Age of Onset  . Hypertension Mother   . Anemia Mother   . Colon polyps Mother   . Cancer Father        lung  . Kidney Stones Father   . Hypertension Father   . Lung cancer Father 9  . Asthma Brother   . Asthma Daughter   . Allergies Daughter   . Asthma Daughter   . Hypertension Brother   . Breast cancer  Neg Hx   . Colon cancer Neg Hx   . Diabetes Neg Hx   . Heart disease Neg Hx     Social History Social History   Tobacco Use  . Smoking status: Never Smoker  . Smokeless tobacco: Never Used  Substance Use Topics  . Alcohol use: No  . Drug use: No     Allergies   Shellfish allergy   Review of Systems Review of Systems  Constitutional: Negative for chills and fever.  HENT: Positive for ear pain ( right ) and sore throat.   Eyes: Negative for pain and visual disturbance.  Respiratory: Positive for cough. Negative for shortness of breath.   Cardiovascular: Negative for chest pain and palpitations.  Gastrointestinal: Negative for abdominal pain and vomiting.  Genitourinary: Negative for dysuria and hematuria.  Musculoskeletal: Negative for arthralgias and back pain.  Skin: Negative for color change and rash.  Neurological: Negative for seizures and syncope.  All other systems reviewed and are negative.    Physical Exam Triage Vital Signs ED Triage Vitals  Enc Vitals Group     BP 04/24/18 1657 136/89      Pulse Rate 04/24/18 1657 84     Resp 04/24/18 1657 16     Temp 04/24/18 1657 97.8 F (36.6 C)     Temp Source 04/24/18 1657 Oral     SpO2 04/24/18 1657 98 %     Weight --      Height --      Head Circumference --      Peak Flow --      Pain Score 04/24/18 1700 6     Pain Loc --      Pain Edu? --      Excl. in [place]? --    No data found.  Updated Vital Signs BP 136/89 (BP Location: Left Arm)   Pulse 84   Temp 97.8 F (36.6 C) (Oral)   Resp 16   SpO2 98%   Visual Acuity Right Eye Distance:   Left Eye Distance:   Bilateral Distance:    Right Eye Near:   Left Eye Near:    Bilateral Near:     Physical Exam  Constitutional: She is oriented to person, place, and time. She appears well-developed and well-nourished.  HENT:  Head: Normocephalic and atraumatic.  effusion noted to right ear.  Eyes: Conjunctivae are normal.  Neck: Normal range of motion.  Pulmonary/Chest: Effort normal.  Musculoskeletal: Normal range of motion.  Neurological: She is alert and oriented to person, place, and time.  Skin: Skin is warm.  Psychiatric: She has a normal mood and affect.  Nursing note and vitals reviewed.    UC Treatments / Results  Labs (all labs ordered are listed, but only abnormal results are displayed) Labs Reviewed - No data to display  EKG None  Radiology No results found.  Procedures Procedures (including critical care time)  Medications Ordered in UC Medications - No data to display  Initial Impression / Assessment and Plan / UC Course  I have reviewed the triage vital signs and the nursing notes.  Pertinent labs & imaging results that were available during my care of the patient were reviewed by me and considered in my medical decision making (see chart for details).      Final Clinical Impressions(s) / UC Diagnoses   Final diagnoses:  None   Discharge Instructions   None    ED Prescriptions    None  Controlled Substance Prescriptions [place]  Controlled Substance Registry consulted? Not Applicable   Vanessa Mau, NP 04/24/18 1721

## 2018-05-04 ENCOUNTER — Ambulatory Visit (HOSPITAL_COMMUNITY)
Admission: EM | Admit: 2018-05-04 | Discharge: 2018-05-04 | Disposition: A | Discharge status: Home / Self Care | Payer: BLUE CROSS/BLUE SHIELD | Attending: Family Medicine | Admitting: Family Medicine

## 2018-05-04 ENCOUNTER — Encounter (HOSPITAL_COMMUNITY): Payer: Self-pay | Admitting: Family Medicine

## 2018-05-04 DIAGNOSIS — H938X3 Other specified disorders of ear, bilateral: Secondary | ICD-10-CM | POA: Diagnosis not present

## 2018-05-04 MED ORDER — TRIAMCINOLONE ACETONIDE 55 MCG/ACT NA AERO: 2.0000 | INHALATION_SPRAY | Freq: Every day | NASAL | 12 refills | Status: DC

## 2018-05-04 MED ORDER — IPRATROPIUM BROMIDE 0.06 % NA SOLN
2.0000 | Freq: Four times a day (QID) | NASAL | 0 refills | Status: DC
Start: 2018-05-04 — End: 2018-05-20

## 2018-05-04 MED ORDER — AZITHROMYCIN 250 MG PO TABS: 250.0000 mg | ORAL_TABLET | Freq: Every day | ORAL | 0 refills | Status: DC

## 2018-05-04 MED ORDER — PREDNISONE 50 MG PO TABS: 50.0000 mg | ORAL_TABLET | Freq: Every day | ORAL | 0 refills | Status: AC

## 2018-05-04 NOTE — ED Provider Notes (Signed)
[facility]    CSN: [id number] Arrival date & time: 05/04/18  1712     History   Chief Complaint Chief Complaint  Patient presents with  . Ear Fullness    HPI Vanessa Copeland is a 52 y.o. female.   52 year old female comes in for continued ear fullness.  States Vanessa Copeland was seen a week ago for right ear pain with fullness.  At that time Vanessa Copeland was treated with Claritin-D, Flonase, and was told to start amoxicillin if symptoms not improving.  States Vanessa Copeland was unable to tolerate amoxicillin as it caused stomach upset, and can discontinued.  States now with bilateral ear fullness, but decreased pain.  States Vanessa Copeland feels like Vanessa Copeland is "in the tunnel".  Denies rhinorrhea, nasal congestion.  Initially had cough that has since resolved.  Denies fever, chills, night sweats.     Past Medical History:  Diagnosis Date  . Allergy   . Anemia   . Asthma    mild intermittent(when allergies are flaring)  . H/O echocardiogram 12/2005   Trace MR, mild TR, nl L ventricular systolic function.  . Kidney stones   . Obesity, unspecified   . Vitamin D deficiency 01/2010    Patient Active Problem List   Diagnosis Date Noted  . Vitamin D deficiency 10/21/2016  . Anemia 10/21/2016  . Screening for STD (sexually transmitted disease) 10/21/2016  . Allergic rhinitis 10/22/2012    Past Surgical History:  Procedure Laterality Date  . CESAREAN SECTION     x3  . OOPHORECTOMY  2002   left due to cyst  . TUBAL LIGATION  1996    OB History    Gravida  3   Para  3   Term      Preterm      AB      Living  3     SAB      TAB      Ectopic      Multiple      Live Births               Home Medications    Prior to Admission medications   Medication Sig Start Date End Date Taking? Authorizing Provider  azithromycin (ZITHROMAX) 250 MG tablet Take 1 tablet (250 mg total) by mouth daily. Take first 2 tablets together, then 1 every day until finished. 05/04/18   Tasia Catchings, Amy V, PA-C    fluticasone (FLONASE) 50 MCG/ACT nasal spray Place 2 sprays into both nostrils daily. 10/18/17   Noe Gens, PA-C  ibuprofen (ADVIL,MOTRIN) 800 MG tablet Take 1 tablet (800 mg total) by mouth 2 (two) times daily. 04/24/18   Jacqualine Mau, NP  ipratropium (ATROVENT) 0.06 % nasal spray Place 2 sprays into both nostrils 4 (four) times daily. 05/04/18   Tasia Catchings, Amy V, PA-C  lidocaine (XYLOCAINE) 2 % solution Use as directed 15 mLs in the mouth or throat as needed for mouth pain. Patient not taking: Reported on 08/13/2017 07/28/17   Ok Edwards, PA-C  loratadine-pseudoephedrine (CLARITIN-D 12 HOUR) 5-120 MG tablet Take 1 tablet by mouth daily for 14 days. 04/24/18 05/08/18  Jacqualine Mau, NP  ondansetron (ZOFRAN ODT) 4 MG disintegrating tablet Take 1 tablet (4 mg total) by mouth every 8 (eight) hours as needed for nausea or vomiting. 08/13/17   Recardo Evangelist, PA-C  oxyCODONE-acetaminophen (PERCOCET/ROXICET) 5-325 MG tablet Take 2 tablets by mouth every 4 (four) hours as needed for severe pain. 08/13/17  Recardo Evangelist, PA-C  predniSONE (DELTASONE) 50 MG tablet Take 1 tablet (50 mg total) by mouth daily for 5 days. 05/04/18 05/09/18  Tasia Catchings, Amy V, PA-C  triamcinolone (NASACORT) 55 MCG/ACT AERO nasal inhaler Place 2 sprays into the nose daily. 05/04/18   Ok Edwards, PA-C    Family History Family History  Problem Relation Age of Onset  . Hypertension Mother   . Anemia Mother   . Colon polyps Mother   . Cancer Father        lung  . Kidney Stones Father   . Hypertension Father   . Lung cancer Father 30  . Asthma Brother   . Asthma Daughter   . Allergies Daughter   . Asthma Daughter   . Hypertension Brother   . Breast cancer Neg Hx   . Colon cancer Neg Hx   . Diabetes Neg Hx   . Heart disease Neg Hx     Social History Social History   Tobacco Use  . Smoking status: Never Smoker  . Smokeless tobacco: Never Used  Substance Use Topics  . Alcohol use: No  . Drug use: No      Allergies   Shellfish allergy   Review of Systems Review of Systems  Reason unable to perform ROS: See HPI as above.     Physical Exam Triage Vital Signs ED Triage Vitals  Enc Vitals Group     BP 05/04/18 1734 126/77     Pulse Rate 05/04/18 1734 69     Resp 05/04/18 1734 18     Temp 05/04/18 1734 98.1 F (36.7 C)     Temp src --      SpO2 05/04/18 1734 100 %     Weight --      Height --      Head Circumference --      Peak Flow --      Pain Score 05/04/18 1733 0     Pain Loc --      Pain Edu? --      Excl. in [place]? --    No data found.  Updated Vital Signs BP 126/77   Pulse 69   Temp 98.1 F (36.7 C)   Resp 18   SpO2 100%   Physical Exam  Constitutional: Vanessa Copeland is oriented to person, place, and time. Vanessa Copeland appears well-developed and well-nourished. No distress.  HENT:  Head: Normocephalic and atraumatic.  Right Ear: External ear and ear canal normal. Tympanic membrane is not erythematous and not bulging.  Left Ear: External ear and ear canal normal. Tympanic membrane is not erythematous and not bulging.  Nose: Nose normal. Right sinus exhibits no maxillary sinus tenderness and no frontal sinus tenderness. Left sinus exhibits no maxillary sinus tenderness and no frontal sinus tenderness.  Mouth/Throat: Uvula is midline, oropharynx is clear and moist and mucous membranes are normal.  Bilateral tragus without tenderness to palpation.  Opaque TM without erythema.  Eyes: Pupils are equal, round, and reactive to light. Conjunctivae are normal.  Neck: Normal range of motion. Neck supple.  Cardiovascular: Normal rate, regular rhythm and normal heart sounds. Exam reveals no gallop and no friction rub.  No murmur heard. Pulmonary/Chest: Effort normal and breath sounds normal. Vanessa Copeland has no decreased breath sounds. Vanessa Copeland has no wheezes. Vanessa Copeland has no rhonchi. Vanessa Copeland has no rales.  Lymphadenopathy:    Vanessa Copeland has no cervical adenopathy.  Neurological: Vanessa Copeland is alert and oriented to  person, place, and time.  Skin: Skin  is warm and dry.  Psychiatric: Vanessa Copeland has a normal mood and affect. Her behavior is normal. Judgment normal.     UC Treatments / Results  Labs (all labs ordered are listed, but only abnormal results are displayed) Labs Reviewed - No data to display  EKG None  Radiology No results found.  Procedures Procedures (including critical care time)  Medications Ordered in UC Medications - No data to display  Initial Impression / Assessment and Plan / UC Course  I have reviewed the triage vital signs and the nursing notes.  Pertinent labs & imaging results that were available during my care of the patient were reviewed by me and considered in my medical decision making (see chart for details).    Discussed possible eustachian tube dysfunction causing symptoms. Prednisone as directed. Symptomatic treatment discussed. Rx of azithromycin provided. Can start for possible otitis media if symptoms not improving. Return precautions given. Otherwise, follow up with ENT if symptoms not improving.   Final Clinical Impressions(s) / UC Diagnoses   Final diagnoses:  Sensation of fullness in both ears    ED Prescriptions    Medication Sig Dispense Auth. Provider   triamcinolone (NASACORT) 55 MCG/ACT AERO nasal inhaler Place 2 sprays into the nose daily. 1 Inhaler Yu, Amy V, PA-C   predniSONE (DELTASONE) 50 MG tablet Take 1 tablet (50 mg total) by mouth daily for 5 days. 5 tablet Yu, Amy V, PA-C   ipratropium (ATROVENT) 0.06 % nasal spray Place 2 sprays into both nostrils 4 (four) times daily. 15 mL Yu, Amy V, PA-C   azithromycin (ZITHROMAX) 250 MG tablet Take 1 tablet (250 mg total) by mouth daily. Take first 2 tablets together, then 1 every day until finished. 6 tablet Tobin Chad, [place] 05/04/18 2128

## 2018-05-04 NOTE — Discharge Instructions (Addendum)
Prednisone as directed. Start nasacort, atrovent nasal spray for nasal congestion/drainage. You can use over the counter nasal saline rinse such as neti pot for nasal congestion. Keep hydrated, your urine should be clear to pale yellow in color. Tylenol/motrin for fever and pain. Can start azithromycin if symptoms nor improving. Follow up with ENT for follow up if symptoms not improving.

## 2018-05-04 NOTE — ED Triage Notes (Signed)
Pt here for left ear fullness and decreased hearing. She has been using peroxide and abx that were prescribed last week. sts that she hasn't been using the abx because of upset stomach.

## 2018-05-20 ENCOUNTER — Encounter: Payer: Self-pay | Admitting: Family Medicine

## 2018-05-20 ENCOUNTER — Ambulatory Visit: Payer: BLUE CROSS/BLUE SHIELD | Admitting: Family Medicine

## 2018-05-20 VITALS — BP 140/88 | HR 72 | Temp 98.6°F | Resp 20 | Ht 63.0 in | Wt 253.8 lb

## 2018-05-20 DIAGNOSIS — Z87442 Personal history of urinary calculi: Secondary | ICD-10-CM | POA: Diagnosis not present

## 2018-05-20 DIAGNOSIS — D649 Anemia, unspecified: Secondary | ICD-10-CM

## 2018-05-20 DIAGNOSIS — R079 Chest pain, unspecified: Secondary | ICD-10-CM | POA: Diagnosis not present

## 2018-05-20 DIAGNOSIS — R310 Gross hematuria: Secondary | ICD-10-CM | POA: Diagnosis not present

## 2018-05-20 DIAGNOSIS — R740 Nonspecific elevation of levels of transaminase and lactic acid dehydrogenase [LDH]: Secondary | ICD-10-CM | POA: Diagnosis not present

## 2018-05-20 DIAGNOSIS — R319 Hematuria, unspecified: Secondary | ICD-10-CM

## 2018-05-20 LAB — POCT URINALYSIS DIP (PROADVANTAGE DEVICE)
BILIRUBIN UA: NEGATIVE
Glucose, UA: NEGATIVE mg/dL
Ketones, POC UA: NEGATIVE mg/dL
NITRITE UA: NEGATIVE
Protein Ur, POC: 100 mg/dL — AB
Specific Gravity, Urine: 1.03
UUROB: NEGATIVE
pH, UA: 6 (ref 5.0–8.0)

## 2018-05-20 NOTE — Progress Notes (Signed)
   Subjective:    Patient ID: Vanessa Copeland, female    DOB: [birth year], 52 y.o.   MRN: [id number]  HPI Chief Complaint  Patient presents with  . Hematuria    since last Friday. Also some pain in her chest but it feels like gas to her.    She is here with complaints of asymptomatic hematuria for the past 7 days.  History of renal calculi.  Denies fever, chills, abdominal pain, back pain, N/V/D. No urinary frequency, urgency, odor, retention or incontinence.   She has the mirena IUD and does not have periods.   Also complains of a one week history of diffuse anterior chest pain that is occurring a couple of times per day. pain is non radiating. States pain feels like "gas". Pain always resolves spontaneously within 30 minutes. Nothing makes her symptoms worse or better. Does not seem to be aggravated by eating or movement.  No pain or shortness of breath with exertion.  Denies having chest pain today.  No palpitations, shortness of breath, cough, LE edema.   Denies history of GERD.   History of anemia.   Denies personal or family history of heart disease   Reviewed allergies, medications, past medical, surgical, family, and social history.    Review of Systems Pertinent positives and negatives in the history of present illness.     Objective:   Physical Exam BP 140/88   Pulse 72   Temp 98.6 F (37 C) (Tympanic)   Resp 20   Ht 5\' 3"  (1.6 m)   Wt 253 lb 12.8 oz (115.1 kg)   BMI 44.96 kg/m  Alert and in no distress.  Pharyngeal area is normal. Neck is supple without adenopathy or thyromegaly. Cardiac exam shows a regular sinus rhythm without murmurs or gallops. Lungs are clear to auscultation. Abdomen is soft, non distended, normal BS, non tender, no rebound or rigidity. No CVA tenderness. Extremities without edema, normal pulses. Skin is warm and dry, no pallor.       Assessment & Plan:  Gross hematuria - Plan: Ambulatory referral to Urology, Urine Culture  Hematuria,  unspecified type - Plan: POCT Urinalysis DIP (Proadvantage Device)  Anemia, unspecified type - Plan: CBC with Differential/Platelet  Intermittent chest pain - Plan: CBC with Differential/Platelet, Comprehensive metabolic panel, EKG 17-CBSW  History of renal calculi  UA with a large amount of RBCs, leukocytes 1+.  urine sent for culture  Referral to urology. She has a history of renal calculi but is asymptomatic.  Anemia- check CBC Chest pain does not appear to be cardiac. Intermittent and not reproducible.  ECG shows a normal sinus rhythm with a rate of 80 and nonspecific t wave abnormality (flat t waves). Reviewed by Dr. Redmond School and myself.  Check labs and follow up if she notices any new or worsening symptoms. Encouraged her to keep a journal and look for triggers.  Consider reflux and she may try a PPI.

## 2018-05-21 DIAGNOSIS — N200 Calculus of kidney: Secondary | ICD-10-CM | POA: Diagnosis not present

## 2018-05-21 DIAGNOSIS — R8271 Bacteriuria: Secondary | ICD-10-CM | POA: Diagnosis not present

## 2018-05-21 DIAGNOSIS — R310 Gross hematuria: Secondary | ICD-10-CM | POA: Diagnosis not present

## 2018-05-21 LAB — COMPREHENSIVE METABOLIC PANEL
ALK PHOS: 96 IU/L (ref 39–117)
ALT: 46 IU/L — AB (ref 0–32)
AST: 26 IU/L (ref 0–40)
Albumin/Globulin Ratio: 1.2 (ref 1.2–2.2)
Albumin: 4.1 g/dL (ref 3.5–5.5)
BILIRUBIN TOTAL: 0.4 mg/dL (ref 0.0–1.2)
BUN/Creatinine Ratio: 11 (ref 9–23)
BUN: 9 mg/dL (ref 6–24)
CHLORIDE: 102 mmol/L (ref 96–106)
CO2: 24 mmol/L (ref 20–29)
Calcium: 9.4 mg/dL (ref 8.7–10.2)
Creatinine, Ser: 0.8 mg/dL (ref 0.57–1.00)
GFR calc Af Amer: 99 mL/min/{1.73_m2} (ref >59)
GFR calc non Af Amer: 86 mL/min/{1.73_m2} (ref >59)
Globulin, Total: 3.3 g/dL (ref 1.5–4.5)
Glucose: 90 mg/dL (ref 65–99)
POTASSIUM: 3.9 mmol/L (ref 3.5–5.2)
Sodium: 141 mmol/L (ref 134–144)
Total Protein: 7.4 g/dL (ref 6.0–8.5)

## 2018-05-21 LAB — CBC WITH DIFFERENTIAL/PLATELET
BASOS: 0 %
Basophils Absolute: 0 10*3/uL (ref 0.0–0.2)
EOS (ABSOLUTE): 0.3 10*3/uL (ref 0.0–0.4)
Eos: 3 %
Hematocrit: 34.6 % (ref 34.0–46.6)
Hemoglobin: 11.3 g/dL (ref 11.1–15.9)
Immature Grans (Abs): 0 10*3/uL (ref 0.0–0.1)
Immature Granulocytes: 0 %
Lymphocytes Absolute: 3.9 10*3/uL — ABNORMAL HIGH (ref 0.7–3.1)
Lymphs: 40 %
MCH: 28.4 pg (ref 26.6–33.0)
MCHC: 32.7 g/dL (ref 31.5–35.7)
MCV: 87 fL (ref 79–97)
MONOS ABS: 0.7 10*3/uL (ref 0.1–0.9)
Monocytes: 7 %
NEUTROS ABS: 4.8 10*3/uL (ref 1.4–7.0)
NEUTROS PCT: 50 %
PLATELETS: 180 10*3/uL (ref 150–450)
RBC: 3.98 x10E6/uL (ref 3.77–5.28)
RDW: 15.4 % (ref 12.3–15.4)
WBC: 9.6 10*3/uL (ref 3.4–10.8)

## 2018-05-21 LAB — URINE CULTURE

## 2018-05-25 LAB — HEPATITIS PANEL, ACUTE
HEP A IGM: NEGATIVE
Hep B C IgM: NEGATIVE
Hep C Virus Ab: <0.1 s/co ratio (ref 0.0–0.9)
Hepatitis B Surface Ag: NEGATIVE

## 2018-05-25 LAB — SPECIMEN STATUS REPORT

## 2018-06-10 DIAGNOSIS — N201 Calculus of ureter: Secondary | ICD-10-CM | POA: Diagnosis not present

## 2018-06-11 ENCOUNTER — Other Ambulatory Visit: Payer: Self-pay | Admitting: Urology

## 2018-06-16 ENCOUNTER — Encounter (HOSPITAL_COMMUNITY): Payer: Self-pay | Admitting: *Deleted

## 2018-06-18 NOTE — H&P (View-Only) (Signed)
H&P  Chief Complaint: left-sided kidney stone  History of Present Illness: Vanessa Copeland is a 52 y.o. year old female who presents this time for lithotripsy for a symptomatic left proximal ureteral stone. She has had intermittent pain as well as gross hematuria with this.  Hounsfield unit 850 Skin to stone distance 11 cm  Past Medical History:  Diagnosis Date  . Allergy   . Anemia   . Asthma    mild intermittent(when allergies are flaring)  . Complication of anesthesia   . H/O echocardiogram 12/2005   Trace MR, mild TR, nl L ventricular systolic function.  Marland Kitchen Heart murmur    since birth. Never has had any problems  . History of kidney stones   . Kidney stones   . Obesity, unspecified   . PONV (postoperative nausea and vomiting)   . Vitamin D deficiency 01/2010    Past Surgical History:  Procedure Laterality Date  . CESAREAN SECTION     x3  . OOPHORECTOMY  2002   left due to cyst  . TUBAL LIGATION  1996    Home Medications:  No medications prior to admission.    Allergies:  Allergies  Allergen Reactions  . Shellfish Allergy Shortness Of Breath    Family History  Problem Relation Age of Onset  . Hypertension Mother   . Anemia Mother   . Colon polyps Mother   . Cancer Father        lung  . Kidney Stones Father   . Hypertension Father   . Lung cancer Father 64  . Asthma Brother   . Asthma Daughter   . Allergies Daughter   . Asthma Daughter   . Hypertension Brother   . Breast cancer Neg Hx   . Colon cancer Neg Hx   . Diabetes Neg Hx   . Heart disease Neg Hx     Social History:  reports that she has never smoked. She has never used smokeless tobacco. She reports that she does not drink alcohol or use drugs.  ROS: A complete review of systems was performed.  All systems are negative except for pertinent findings as noted.  Physical Exam:  Vital signs in last 24 hours:   General:  Alert and oriented, No acute distress HEENT: Normocephalic,  atraumatic Neck: No JVD or lymphadenopathy Cardiovascular: Regular rate and rhythm Lungs: Clear bilaterally Abdomen: Soft, nontender, nondistended, no abdominal masses Back: No CVA tenderness Extremities: No edema Neurologic: Grossly intact  Laboratory Data:  No results found for this or any previous visit (from the past 24 hour(s)). No results found for this or any previous visit (from the past 240 hour(s)). Creatinine: No results for input(s): CREATININE in the last 168 hours.  Radiologic Imaging: No results found.  Impression/Assessment:  Left UPJ stone, 7 mm  Plan:  Extracorporeal shockwave lithotripsy  Lillette Boxer Gabby Rackers 06/18/2018, 5:42 PM  Lillette Boxer. Mckyle Solanki MD

## 2018-06-18 NOTE — H&P (Signed)
H&P  Chief Complaint: left-sided kidney stone  History of Present Illness: Vanessa Copeland is a 52 y.o. year old female who presents this time for lithotripsy for a symptomatic left proximal ureteral stone. She has had intermittent pain as well as gross hematuria with this.  Hounsfield unit 850 Skin to stone distance 11 cm  Past Medical History:  Diagnosis Date  . Allergy   . Anemia   . Asthma    mild intermittent(when allergies are flaring)  . Complication of anesthesia   . H/O echocardiogram 12/2005   Trace MR, mild TR, nl L ventricular systolic function.  Marland Kitchen Heart murmur    since birth. Never has had any problems  . History of kidney stones   . Kidney stones   . Obesity, unspecified   . PONV (postoperative nausea and vomiting)   . Vitamin D deficiency 01/2010    Past Surgical History:  Procedure Laterality Date  . CESAREAN SECTION     x3  . OOPHORECTOMY  2002   left due to cyst  . TUBAL LIGATION  1996    Home Medications:  No medications prior to admission.    Allergies:  Allergies  Allergen Reactions  . Shellfish Allergy Shortness Of Breath    Family History  Problem Relation Age of Onset  . Hypertension Mother   . Anemia Mother   . Colon polyps Mother   . Cancer Father        lung  . Kidney Stones Father   . Hypertension Father   . Lung cancer Father 79  . Asthma Brother   . Asthma Daughter   . Allergies Daughter   . Asthma Daughter   . Hypertension Brother   . Breast cancer Neg Hx   . Colon cancer Neg Hx   . Diabetes Neg Hx   . Heart disease Neg Hx     Social History:  reports that she has never smoked. She has never used smokeless tobacco. She reports that she does not drink alcohol or use drugs.  ROS: A complete review of systems was performed.  All systems are negative except for pertinent findings as noted.  Physical Exam:  Vital signs in last 24 hours:   General:  Alert and oriented, No acute distress HEENT: Normocephalic,  atraumatic Neck: No JVD or lymphadenopathy Cardiovascular: Regular rate and rhythm Lungs: Clear bilaterally Abdomen: Soft, nontender, nondistended, no abdominal masses Back: No CVA tenderness Extremities: No edema Neurologic: Grossly intact  Laboratory Data:  No results found for this or any previous visit (from the past 24 hour(s)). No results found for this or any previous visit (from the past 240 hour(s)). Creatinine: No results for input(s): CREATININE in the last 168 hours.  Radiologic Imaging: No results found.  Impression/Assessment:  Left UPJ stone, 7 mm  Plan:  Extracorporeal shockwave lithotripsy  Lillette Boxer Dasha Kawabata 06/18/2018, 5:42 PM  Lillette Boxer. Adithya Difrancesco MD

## 2018-06-21 ENCOUNTER — Ambulatory Visit (HOSPITAL_COMMUNITY)
Admission: RE | Admit: 2018-06-21 | Discharge: 2018-06-21 | Disposition: A | Discharge status: Home / Self Care | Payer: BLUE CROSS/BLUE SHIELD | Source: Ambulatory Visit | Attending: Urology | Admitting: Urology

## 2018-06-21 ENCOUNTER — Encounter (HOSPITAL_COMMUNITY)
Admission: RE | Disposition: A | Discharge status: Home / Self Care | Payer: Self-pay | Source: Ambulatory Visit | Attending: Urology

## 2018-06-21 ENCOUNTER — Other Ambulatory Visit: Payer: Self-pay

## 2018-06-21 ENCOUNTER — Encounter (HOSPITAL_COMMUNITY): Payer: Self-pay | Admitting: *Deleted

## 2018-06-21 ENCOUNTER — Ambulatory Visit (HOSPITAL_COMMUNITY): Payer: BLUE CROSS/BLUE SHIELD

## 2018-06-21 DIAGNOSIS — N201 Calculus of ureter: Secondary | ICD-10-CM | POA: Diagnosis not present

## 2018-06-21 DIAGNOSIS — Z87442 Personal history of urinary calculi: Secondary | ICD-10-CM | POA: Insufficient documentation

## 2018-06-21 DIAGNOSIS — E559 Vitamin D deficiency, unspecified: Secondary | ICD-10-CM | POA: Insufficient documentation

## 2018-06-21 DIAGNOSIS — E669 Obesity, unspecified: Secondary | ICD-10-CM | POA: Insufficient documentation

## 2018-06-21 DIAGNOSIS — Z79899 Other long term (current) drug therapy: Secondary | ICD-10-CM | POA: Diagnosis not present

## 2018-06-21 DIAGNOSIS — Z6841 Body Mass Index (BMI) 40.0 and over, adult: Secondary | ICD-10-CM | POA: Diagnosis not present

## 2018-06-21 DIAGNOSIS — J45909 Unspecified asthma, uncomplicated: Secondary | ICD-10-CM | POA: Insufficient documentation

## 2018-06-21 DIAGNOSIS — N200 Calculus of kidney: Secondary | ICD-10-CM | POA: Diagnosis not present

## 2018-06-21 HISTORY — DX: Adverse effect of unspecified anesthetic, initial encounter: T41.45XA

## 2018-06-21 HISTORY — DX: Personal history of urinary calculi: Z87.442

## 2018-06-21 HISTORY — PX: EXTRACORPOREAL SHOCK WAVE LITHOTRIPSY: SHX1557

## 2018-06-21 HISTORY — DX: Other complications of anesthesia, initial encounter: T88.59XA

## 2018-06-21 HISTORY — DX: Nausea with vomiting, unspecified: R11.2

## 2018-06-21 HISTORY — DX: Other specified postprocedural states: Z98.890

## 2018-06-21 HISTORY — DX: Other specified postprocedural states: R11.2

## 2018-06-21 HISTORY — DX: Cardiac murmur, unspecified: R01.1

## 2018-06-21 SURGERY — LITHOTRIPSY, ESWL
Anesthesia: LOCAL | Laterality: Left

## 2018-06-21 MED ORDER — KETOROLAC TROMETHAMINE 30 MG/ML IJ SOLN
30.0000 mg | Freq: Once | INTRAMUSCULAR | Status: AC
  Administered 2018-06-21: 30 mg via INTRAVENOUS
  Filled 2018-06-21: qty 1

## 2018-06-21 MED ORDER — DIPHENHYDRAMINE HCL 25 MG PO CAPS
25.0000 mg | ORAL_CAPSULE | ORAL | Status: AC
  Administered 2018-06-21: 25 mg via ORAL
  Filled 2018-06-21: qty 1

## 2018-06-21 MED ORDER — SODIUM CHLORIDE 0.9 % IV SOLN
INTRAVENOUS | Status: DC
  Administered 2018-06-21 (×2): via INTRAVENOUS

## 2018-06-21 MED ORDER — CIPROFLOXACIN HCL 500 MG PO TABS
500.0000 mg | ORAL_TABLET | ORAL | Status: AC
  Administered 2018-06-21: 500 mg via ORAL
  Filled 2018-06-21: qty 1

## 2018-06-21 MED ORDER — DIAZEPAM 5 MG PO TABS
10.0000 mg | ORAL_TABLET | ORAL | Status: AC
  Administered 2018-06-21: 10 mg via ORAL
  Filled 2018-06-21: qty 2

## 2018-06-21 MED ORDER — OXYCODONE HCL 5 MG PO TABS: 5.0000 mg | ORAL_TABLET | ORAL | 0 refills | Status: DC | PRN

## 2018-06-21 SURGICAL SUPPLY — 2 items
COVER SURGICAL LIGHT HANDLE (MISCELLANEOUS) ×2 IMPLANT
TOWEL OR 17X26 10 PK STRL BLUE (TOWEL DISPOSABLE) ×2 IMPLANT

## 2018-06-21 NOTE — Discharge Instructions (Signed)
Moderate Conscious Sedation, Adult, Care After These instructions provide you with information about caring for yourself after your procedure. Your health care provider may also give you more specific instructions. Your treatment has been planned according to current medical practices, but problems sometimes occur. Call your health care provider if you have any problems or questions after your procedure. What can I expect after the procedure? After your procedure, it is common:  To feel sleepy for several hours.  To feel clumsy and have poor balance for several hours.  To have poor judgment for several hours.  To vomit if you eat too soon.  Follow these instructions at home: For at least 24 hours after the procedure:   Do not: ? Participate in activities where you could fall or become injured. ? Drive. ? Use heavy machinery. ? Drink alcohol. ? Take sleeping pills or medicines that cause drowsiness. ? Make important decisions or sign legal documents. ? Take care of children on your own.  Rest. Eating and drinking  Follow the diet recommended by your health care provider.  If you vomit: ? Drink water, juice, or soup when you can drink without vomiting. ? Make sure you have little or no nausea before eating solid foods. General instructions  Have a responsible adult stay with you until you are awake and alert.  Take over-the-counter and prescription medicines only as told by your health care provider.  If you smoke, do not smoke without supervision.  Keep all follow-up visits as told by your health care provider. This is important. Contact a health care provider if:  You keep feeling nauseous or you keep vomiting.  You feel light-headed.  You develop a rash.  You have a fever. Get help right away if:  You have trouble breathing. This information is not intended to replace advice given to you by your health care provider. Make sure you discuss any questions you have  with your health care provider. Document Released: 09/28/2013 Document Revised: 05/12/2016 Document Reviewed: 03/29/2016 Elsevier Interactive Patient Education  2018 [place] After This sheet gives you information about how to care for yourself after your procedure. Your health care provider may also give you more specific instructions. If you have problems or questions, contact your health care provider. What can I expect after the procedure? After the procedure, it is common to have:  Some blood in your urine. This should only last for a few days.  Soreness in your back, sides, or upper abdomen for a few days.  Blotches or bruises on your back where the pressure wave entered the skin.  Pain, discomfort, or nausea when pieces (fragments) of the kidney stone move through the tube that carries urine from the kidney to the bladder (ureter). Stone fragments may pass soon after the procedure, but they may continue to pass for up to 4-8 weeks. ? If you have severe pain or nausea, contact your health care provider. This may be caused by a large stone that was not broken up, and this may mean that you need more treatment.  Some pain or discomfort during urination.  Some pain or discomfort in the lower abdomen or (in men) at the base of the penis.  Follow these instructions at home: Medicines  Take over-the-counter and prescription medicines only as told by your health care provider.  If you were prescribed an antibiotic medicine, take it as told by your health care provider. Do not stop taking the antibiotic even if you  start to feel better.  Do not drive for 24 hours if you were given a medicine to help you relax (sedative).  Do not drive or use heavy machinery while taking prescription pain medicine. Eating and drinking  Drink enough water and fluids to keep your urine clear or pale yellow. This helps any remaining pieces of the stone to pass. It can also help  prevent new stones from forming.  Eat plenty of fresh fruits and vegetables.  Follow instructions from your health care provider about eating and drinking restrictions. You may be instructed: ? To reduce how much salt (sodium) you eat or drink. Check ingredients and nutrition facts on packaged foods and beverages. ? To reduce how much meat you eat.  Eat the recommended amount of calcium for your age and gender. Ask your health care provider how much calcium you should have. General instructions  Get plenty of rest.  Most people can resume normal activities 1-2 days after the procedure. Ask your health care provider what activities are safe for you.  If directed, strain all urine through the strainer that was provided by your health care provider. ? Keep all fragments for your health care provider to see. Any stones that are found may be sent to a medical lab for examination. The stone may be as small as a grain of salt.  Keep all follow-up visits as told by your health care provider. This is important. Contact a health care provider if:  You have pain that is severe or does not get better with medicine.  You have nausea that is severe or does not go away.  You have blood in your urine longer than your health care provider told you to expect.  You have more blood in your urine.  You have pain during urination that does not go away.  You urinate more frequently than usual and this does not go away.  You develop a rash or any other possible signs of an allergic reaction. Get help right away if:  You have severe pain in your back, sides, or upper abdomen.  You have severe pain while urinating.  Your urine is very dark red.  You have blood in your stool (feces).  You cannot pass any urine at all.  You feel a strong urge to urinate after emptying your bladder.  You have a fever or chills.  You develop shortness of breath, difficulty breathing, or chest pain.  You have  severe nausea that leads to persistent vomiting.  You faint. Summary  After this procedure, it is common to have some pain, discomfort, or nausea when pieces (fragments) of the kidney stone move through the tube that carries urine from the kidney to the bladder (ureter). If this pain or nausea is severe, however, you should contact your health care provider.  Most people can resume normal activities 1-2 days after the procedure. Ask your health care provider what activities are safe for you.  Drink enough water and fluids to keep your urine clear or pale yellow. This helps any remaining pieces of the stone to pass, and it can help prevent new stones from forming.  If directed, strain your urine and keep all fragments for your health care provider to see. Fragments or stones may be as small as a grain of salt.  Get help right away if you have severe pain in your back, sides, or upper abdomen or have severe pain while urinating. This information is not intended to replace advice  given to you by your health care provider. Make sure you discuss any questions you have with your health care provider. Document Released: 12/28/2007 Document Revised: 10/29/2016 Document Reviewed: 10/29/2016 Elsevier Interactive Patient Education  2018 [place] discharge instructions in chart.

## 2018-06-21 NOTE — Op Note (Signed)
See [facility] OP note scanned into chart. 

## 2018-06-21 NOTE — Interval H&P Note (Signed)
History and Physical Interval Note:  06/21/2018 8:47 AM  Vanessa Copeland  has presented today for surgery, with the diagnosis of LEFT URETERAL CALCULUS  The various methods of treatment have been discussed with the patient and family. After consideration of risks, benefits and other options for treatment, the patient has consented to  Procedure(s): LEFT EXTRACORPOREAL SHOCK WAVE LITHOTRIPSY (ESWL) (Left) as a surgical intervention .  The patient's history has been reviewed, patient examined, no change in status, stable for surgery.  I have reviewed the patient's chart and labs.  Questions were answered to the patient's satisfaction.     Lillette Boxer Quintyn Dombek

## 2018-06-22 ENCOUNTER — Encounter (HOSPITAL_COMMUNITY): Payer: Self-pay | Admitting: Urology

## 2018-06-26 ENCOUNTER — Other Ambulatory Visit: Payer: Self-pay | Admitting: Family Medicine

## 2018-06-28 NOTE — Telephone Encounter (Signed)
Is this okay to refill? Looks like the hospital refilled this last back in may

## 2018-07-09 DIAGNOSIS — N201 Calculus of ureter: Secondary | ICD-10-CM | POA: Diagnosis not present

## 2018-07-12 ENCOUNTER — Other Ambulatory Visit: Payer: Self-pay | Admitting: Urology

## 2018-07-12 NOTE — Progress Notes (Signed)
ekg 05-20-18 epic

## 2018-07-12 NOTE — Patient Instructions (Signed)
Vanessa Copeland  07/12/2018   Your procedure is scheduled on: 07-16-18   Report to [facility] Main  Entrance    Report to admitting at 10:15AM    Call this number if you have problems the morning of surgery [phone]     Remember: Do not eat food or drink liquids :After Midnight.     Take these medicines the morning of surgery with A SIP OF WATER: TYLENOL IF NEEDED, OXYCODONE IF NEEDED                                You may not have any metal on your body including hair pins and              piercings  Do not wear jewelry, make-up, lotions, powders or perfumes, deodorant             Do not wear nail polish.  Do not shave  48 hours prior to surgery.          Do not bring valuables to the hospital. [place].  Contacts, dentures or bridgework may not be worn into surgery.      Patients discharged the day of surgery will not be allowed to drive home.  Name and phone number of your driver:  Special Instructions: N/A              Please read over the following fact sheets you were given: _____________________________________________________________________             [facility] - Preparing for Surgery Before surgery, you can play an important role.  Because skin is not sterile, your skin needs to be as free of germs as possible.  You can reduce the number of germs on your skin by washing with CHG (chlorahexidine gluconate) soap before surgery.  CHG is an antiseptic cleaner which kills germs and bonds with the skin to continue killing germs even after washing. Please DO NOT use if you have an allergy to CHG or antibacterial soaps.  If your skin becomes reddened/irritated stop using the CHG and inform your nurse when you arrive at Short Stay. Do not shave (including legs and underarms) for at least 48 hours prior to the first CHG shower.  You may shave your face/neck. Please follow these instructions  carefully:  1.  Shower with CHG Soap the night before surgery and the  morning of Surgery.  2.  If you choose to wash your hair, wash your hair first as usual with your  normal  shampoo.  3.  After you shampoo, rinse your hair and body thoroughly to remove the  shampoo.                           4.  Use CHG as you would any other liquid soap.  You can apply chg directly  to the skin and wash                       Gently with a scrungie or clean washcloth.  5.  Apply the CHG Soap to your body ONLY FROM THE NECK DOWN.   Do not use on face/ open  Wound or open sores. Avoid contact with eyes, ears mouth and genitals (private parts).                       Wash face,  Genitals (private parts) with your normal soap.             6.  Wash thoroughly, paying special attention to the area where your surgery  will be performed.  7.  Thoroughly rinse your body with warm water from the neck down.  8.  DO NOT shower/wash with your normal soap after using and rinsing off  the CHG Soap.                9.  Pat yourself dry with a clean towel.            10.  Wear clean pajamas.            11.  Place clean sheets on your bed the night of your first shower and do not  sleep with pets. Day of Surgery : Do not apply any lotions/deodorants the morning of surgery.  Please wear clean clothes to the hospital/surgery center.  FAILURE TO FOLLOW THESE INSTRUCTIONS MAY RESULT IN THE CANCELLATION OF YOUR SURGERY PATIENT SIGNATURE_________________________________  NURSE SIGNATURE__________________________________  ________________________________________________________________________

## 2018-07-13 ENCOUNTER — Other Ambulatory Visit: Payer: Self-pay

## 2018-07-13 ENCOUNTER — Encounter (HOSPITAL_COMMUNITY): Payer: Self-pay

## 2018-07-13 ENCOUNTER — Encounter (HOSPITAL_COMMUNITY)
Admission: RE | Admit: 2018-07-13 | Discharge: 2018-07-13 | Disposition: A | Discharge status: Home / Self Care | Payer: BLUE CROSS/BLUE SHIELD | Source: Ambulatory Visit | Attending: Urology | Admitting: Urology

## 2018-07-13 DIAGNOSIS — N201 Calculus of ureter: Secondary | ICD-10-CM | POA: Diagnosis not present

## 2018-07-13 DIAGNOSIS — Z01818 Encounter for other preprocedural examination: Secondary | ICD-10-CM | POA: Insufficient documentation

## 2018-07-13 LAB — CBC
HCT: 35.2 % — ABNORMAL LOW (ref 36.0–46.0)
HEMOGLOBIN: 11.5 g/dL — AB (ref 12.0–15.0)
MCH: 28.7 pg (ref 26.0–34.0)
MCHC: 32.7 g/dL (ref 30.0–36.0)
MCV: 87.8 fL (ref 78.0–100.0)
Platelets: 377 10*3/uL (ref 150–400)
RBC: 4.01 MIL/uL (ref 3.87–5.11)
RDW: 15.7 % — ABNORMAL HIGH (ref 11.5–15.5)
WBC: 7.9 10*3/uL (ref 4.0–10.5)

## 2018-07-13 LAB — HCG, SERUM, QUALITATIVE: PREG SERUM: NEGATIVE

## 2018-07-16 ENCOUNTER — Ambulatory Visit (HOSPITAL_COMMUNITY): Payer: BLUE CROSS/BLUE SHIELD

## 2018-07-16 ENCOUNTER — Encounter (HOSPITAL_COMMUNITY): Payer: Self-pay | Admitting: *Deleted

## 2018-07-16 ENCOUNTER — Emergency Department (HOSPITAL_COMMUNITY)
Admission: EM | Admit: 2018-07-16 | Discharge: 2018-07-17 | Disposition: A | Discharge status: Home / Self Care | Payer: BLUE CROSS/BLUE SHIELD | Source: Home / Self Care | Attending: Emergency Medicine | Admitting: Emergency Medicine

## 2018-07-16 ENCOUNTER — Ambulatory Visit (HOSPITAL_COMMUNITY): Payer: BLUE CROSS/BLUE SHIELD | Admitting: Anesthesiology

## 2018-07-16 ENCOUNTER — Encounter (HOSPITAL_COMMUNITY)
Admission: RE | Disposition: A | Discharge status: Home / Self Care | Payer: Self-pay | Source: Ambulatory Visit | Attending: Urology

## 2018-07-16 ENCOUNTER — Other Ambulatory Visit: Payer: Self-pay

## 2018-07-16 ENCOUNTER — Ambulatory Visit (HOSPITAL_COMMUNITY)
Admission: RE | Admit: 2018-07-16 | Discharge: 2018-07-16 | Disposition: A | Discharge status: Home / Self Care | Payer: BLUE CROSS/BLUE SHIELD | Source: Ambulatory Visit | Attending: Urology | Admitting: Urology

## 2018-07-16 ENCOUNTER — Encounter (HOSPITAL_COMMUNITY): Payer: Self-pay | Admitting: Emergency Medicine

## 2018-07-16 DIAGNOSIS — R319 Hematuria, unspecified: Secondary | ICD-10-CM | POA: Insufficient documentation

## 2018-07-16 DIAGNOSIS — J4520 Mild intermittent asthma, uncomplicated: Secondary | ICD-10-CM

## 2018-07-16 DIAGNOSIS — R011 Cardiac murmur, unspecified: Secondary | ICD-10-CM | POA: Insufficient documentation

## 2018-07-16 DIAGNOSIS — Z91013 Allergy to seafood: Secondary | ICD-10-CM | POA: Insufficient documentation

## 2018-07-16 DIAGNOSIS — Z79899 Other long term (current) drug therapy: Secondary | ICD-10-CM

## 2018-07-16 DIAGNOSIS — N1330 Unspecified hydronephrosis: Secondary | ICD-10-CM | POA: Diagnosis not present

## 2018-07-16 DIAGNOSIS — Z6841 Body Mass Index (BMI) 40.0 and over, adult: Secondary | ICD-10-CM | POA: Diagnosis not present

## 2018-07-16 DIAGNOSIS — D649 Anemia, unspecified: Secondary | ICD-10-CM | POA: Diagnosis not present

## 2018-07-16 DIAGNOSIS — J45909 Unspecified asthma, uncomplicated: Secondary | ICD-10-CM | POA: Diagnosis not present

## 2018-07-16 DIAGNOSIS — N132 Hydronephrosis with renal and ureteral calculous obstruction: Secondary | ICD-10-CM | POA: Diagnosis not present

## 2018-07-16 DIAGNOSIS — G8918 Other acute postprocedural pain: Secondary | ICD-10-CM | POA: Insufficient documentation

## 2018-07-16 DIAGNOSIS — N201 Calculus of ureter: Secondary | ICD-10-CM | POA: Diagnosis not present

## 2018-07-16 HISTORY — PX: CYSTOSCOPY WITH RETROGRADE PYELOGRAM, URETEROSCOPY AND STENT PLACEMENT: SHX5789

## 2018-07-16 HISTORY — PX: HOLMIUM LASER APPLICATION: SHX5852

## 2018-07-16 LAB — CBC WITH DIFFERENTIAL/PLATELET
BASOS ABS: 0 10*3/uL (ref 0.0–0.1)
BASOS PCT: 0 %
EOS ABS: 0 10*3/uL (ref 0.0–0.7)
EOS PCT: 0 %
HCT: 36 % (ref 36.0–46.0)
Hemoglobin: 12.3 g/dL (ref 12.0–15.0)
Lymphocytes Relative: 9 %
Lymphs Abs: 1 10*3/uL (ref 0.7–4.0)
MCH: 29.9 pg (ref 26.0–34.0)
MCHC: 34.2 g/dL (ref 30.0–36.0)
MCV: 87.6 fL (ref 78.0–100.0)
Monocytes Absolute: 0.1 10*3/uL (ref 0.1–1.0)
Monocytes Relative: 1 %
NEUTROS PCT: 90 %
Neutro Abs: 9.9 10*3/uL — ABNORMAL HIGH (ref 1.7–7.7)
PLATELETS: 438 10*3/uL — AB (ref 150–400)
RBC: 4.11 MIL/uL (ref 3.87–5.11)
RDW: 15.4 % (ref 11.5–15.5)
WBC: 11 10*3/uL — ABNORMAL HIGH (ref 4.0–10.5)

## 2018-07-16 SURGERY — CYSTOURETEROSCOPY, WITH RETROGRADE PYELOGRAM AND STENT INSERTION
Anesthesia: General | Laterality: Left

## 2018-07-16 MED ORDER — FENTANYL CITRATE (PF) 100 MCG/2ML IJ SOLN
INTRAMUSCULAR | Status: DC | PRN
  Administered 2018-07-16 (×2): 25 ug via INTRAVENOUS
  Administered 2018-07-16: 50 ug via INTRAVENOUS

## 2018-07-16 MED ORDER — OXYCODONE HCL 5 MG PO TABS: 5.0000 mg | ORAL_TABLET | Freq: Once | ORAL | Status: DC | PRN

## 2018-07-16 MED ORDER — ONDANSETRON HCL 4 MG/2ML IJ SOLN
INTRAMUSCULAR | Status: AC
  Filled 2018-07-16: qty 2

## 2018-07-16 MED ORDER — DEXAMETHASONE SODIUM PHOSPHATE 10 MG/ML IJ SOLN
INTRAMUSCULAR | Status: DC | PRN
  Administered 2018-07-16: 10 mg via INTRAVENOUS

## 2018-07-16 MED ORDER — CEFAZOLIN SODIUM-DEXTROSE 2-4 GM/100ML-% IV SOLN
2.0000 g | INTRAVENOUS | Status: AC
  Administered 2018-07-16: 2 g via INTRAVENOUS
  Filled 2018-07-16: qty 100

## 2018-07-16 MED ORDER — PROPOFOL 10 MG/ML IV BOLUS
INTRAVENOUS | Status: DC | PRN
  Administered 2018-07-16: 200 mg via INTRAVENOUS

## 2018-07-16 MED ORDER — LIDOCAINE 2% (20 MG/ML) 5 ML SYRINGE
INTRAMUSCULAR | Status: DC | PRN
  Administered 2018-07-16: 100 mg via INTRAVENOUS

## 2018-07-16 MED ORDER — PROPOFOL 10 MG/ML IV BOLUS
INTRAVENOUS | Status: AC
Start: 2018-07-16 — End: ?
  Filled 2018-07-16: qty 40

## 2018-07-16 MED ORDER — SULFAMETHOXAZOLE-TRIMETHOPRIM 800-160 MG PO TABS: 1.0000 | ORAL_TABLET | Freq: Two times a day (BID) | ORAL | 0 refills | Status: DC

## 2018-07-16 MED ORDER — IOHEXOL 300 MG/ML  SOLN
INTRAMUSCULAR | Status: DC | PRN
  Administered 2018-07-16: 50 mL

## 2018-07-16 MED ORDER — ONDANSETRON 4 MG PO TBDP
4.0000 mg | ORAL_TABLET | Freq: Once | ORAL | Status: AC | PRN
  Administered 2018-07-16: 4 mg via ORAL
  Filled 2018-07-16: qty 1

## 2018-07-16 MED ORDER — MIDAZOLAM HCL 5 MG/5ML IJ SOLN
INTRAMUSCULAR | Status: DC | PRN
  Administered 2018-07-16: 2 mg via INTRAVENOUS

## 2018-07-16 MED ORDER — 0.9 % SODIUM CHLORIDE (POUR BTL) OPTIME
TOPICAL | Status: DC | PRN
  Administered 2018-07-16: 1000 mL

## 2018-07-16 MED ORDER — DEXAMETHASONE SODIUM PHOSPHATE 10 MG/ML IJ SOLN
INTRAMUSCULAR | Status: AC
  Filled 2018-07-16: qty 1

## 2018-07-16 MED ORDER — HYDROMORPHONE HCL 1 MG/ML IJ SOLN
INTRAMUSCULAR | Status: AC
  Filled 2018-07-16: qty 1

## 2018-07-16 MED ORDER — HYDROMORPHONE HCL 1 MG/ML IJ SOLN
0.2500 mg | INTRAMUSCULAR | Status: DC | PRN
  Administered 2018-07-16: 0.5 mg via INTRAVENOUS

## 2018-07-16 MED ORDER — FENTANYL CITRATE (PF) 100 MCG/2ML IJ SOLN
50.0000 ug | INTRAMUSCULAR | Status: AC | PRN
  Administered 2018-07-16 (×2): 50 ug via NASAL
  Filled 2018-07-16 (×2): qty 2

## 2018-07-16 MED ORDER — LACTATED RINGERS IV SOLN
INTRAVENOUS | Status: DC
  Administered 2018-07-16: 11:00:00 via INTRAVENOUS

## 2018-07-16 MED ORDER — PROMETHAZINE HCL 25 MG/ML IJ SOLN: 6.2500 mg | INTRAMUSCULAR | Status: DC | PRN

## 2018-07-16 MED ORDER — FAMOTIDINE IN NACL 20-0.9 MG/50ML-% IV SOLN
20.0000 mg | Freq: Once | INTRAVENOUS | Status: AC
Start: 2018-07-16 — End: 2018-07-16
  Administered 2018-07-16: 20 mg via INTRAVENOUS
  Filled 2018-07-16: qty 50

## 2018-07-16 MED ORDER — ONDANSETRON HCL 4 MG/2ML IJ SOLN
INTRAMUSCULAR | Status: DC | PRN
  Administered 2018-07-16: 4 mg via INTRAVENOUS

## 2018-07-16 MED ORDER — LIDOCAINE 2% (20 MG/ML) 5 ML SYRINGE
INTRAMUSCULAR | Status: AC
  Filled 2018-07-16: qty 5

## 2018-07-16 MED ORDER — OXYCODONE-ACETAMINOPHEN 5-325 MG PO TABS: 0.5000 | ORAL_TABLET | ORAL | 0 refills | Status: DC | PRN

## 2018-07-16 MED ORDER — MIDAZOLAM HCL 2 MG/2ML IJ SOLN
INTRAMUSCULAR | Status: AC
Start: 2018-07-16 — End: ?
  Filled 2018-07-16: qty 2

## 2018-07-16 MED ORDER — FENTANYL CITRATE (PF) 100 MCG/2ML IJ SOLN
50.0000 ug | INTRAMUSCULAR | Status: DC | PRN
  Administered 2018-07-16: 50 ug via INTRAVENOUS
  Filled 2018-07-16: qty 2

## 2018-07-16 MED ORDER — OXYCODONE HCL 5 MG/5ML PO SOLN
5.0000 mg | Freq: Once | ORAL | Status: DC | PRN
  Filled 2018-07-16: qty 5

## 2018-07-16 MED ORDER — FENTANYL CITRATE (PF) 100 MCG/2ML IJ SOLN
INTRAMUSCULAR | Status: AC
  Filled 2018-07-16: qty 2

## 2018-07-16 MED ORDER — SODIUM CHLORIDE 0.9 % IR SOLN
Status: DC | PRN
  Administered 2018-07-16: 6000 mL

## 2018-07-16 SURGICAL SUPPLY — 22 items
BAG URO CATCHER STRL LF (MISCELLANEOUS) ×2 IMPLANT
CATH INTERMIT  6FR 70CM (CATHETERS) ×2 IMPLANT
CLOTH BEACON ORANGE TIMEOUT ST (SAFETY) ×2 IMPLANT
COVER FOOTSWITCH UNIV (MISCELLANEOUS) IMPLANT
COVER SURGICAL LIGHT HANDLE (MISCELLANEOUS) IMPLANT
EXTRACTOR STONE NITINOL NGAGE (UROLOGICAL SUPPLIES) ×2 IMPLANT
FIBER LASER FLEXIVA 1000 (UROLOGICAL SUPPLIES) IMPLANT
FIBER LASER FLEXIVA 365 (UROLOGICAL SUPPLIES) IMPLANT
FIBER LASER FLEXIVA 550 (UROLOGICAL SUPPLIES) IMPLANT
FIBER LASER TRAC TIP (UROLOGICAL SUPPLIES) ×2 IMPLANT
GLOVE BIO SURGEON STRL SZ8 (GLOVE) ×2 IMPLANT
GOWN STRL REUS W/TWL XL LVL3 (GOWN DISPOSABLE) ×4 IMPLANT
GUIDEWIRE ANG ZIPWIRE 038X150 (WIRE) ×2 IMPLANT
GUIDEWIRE STR DUAL SENSOR (WIRE) ×2 IMPLANT
MANIFOLD NEPTUNE II (INSTRUMENTS) ×2 IMPLANT
PACK CYSTO (CUSTOM PROCEDURE TRAY) ×2 IMPLANT
SHEATH URETERAL 12FRX35CM (MISCELLANEOUS) ×2 IMPLANT
STENT CONTOUR 6FRX26X.038 (STENTS) IMPLANT
STENT URET 6FRX24 CONTOUR (STENTS) ×2 IMPLANT
TUBE FEEDING 8FR 16IN STR KANG (MISCELLANEOUS) IMPLANT
TUBING CONNECTING 10 (TUBING) ×2 IMPLANT
TUBING UROLOGY SET (TUBING) ×2 IMPLANT

## 2018-07-16 NOTE — Discharge Instructions (Signed)

## 2018-07-16 NOTE — ED Provider Notes (Signed)
[name] Provider Note   CSN: [id number] Arrival date & time: 07/16/18  1938     History   Chief Complaint Chief Complaint  Patient presents with  . Post-op Problem    HPI Vanessa Copeland is a 52 y.o. female.  Patient presents to the emergency department with a chief complaint of postop problem.  She states that she had a ureteral stent placed today by Dr. Alyson Ingles with [facility].  She states that her pain is been fairly severe, and reports that she had some leaking of blood/urine.  She read in her discharge paperwork that she was to come to the emergency department if this happened.  She states that she feels improved after receiving fentanyl in triage.  She denies any fever, nausea, or vomiting.  She denies any other associated symptoms.  The history is provided by the patient. No language interpreter was used.    Past Medical History:  Diagnosis Date  . Allergy   . Anemia   . Asthma    mild intermittent(when allergies are flaring)  . Complication of anesthesia   . H/O echocardiogram 12/2005   Trace MR, mild TR, nl L ventricular systolic function.  Marland Kitchen Heart murmur    since birth. Never has had any problems  . History of kidney stones   . Kidney stones   . Obesity, unspecified   . PONV (postoperative nausea and vomiting)   . Vitamin D deficiency 01/2010    Patient Active Problem List   Diagnosis Date Noted  . Vitamin D deficiency 10/21/2016  . Anemia 10/21/2016  . Screening for STD (sexually transmitted disease) 10/21/2016  . Allergic rhinitis 10/22/2012    Past Surgical History:  Procedure Laterality Date  . CESAREAN SECTION     x3  . EXTRACORPOREAL SHOCK WAVE LITHOTRIPSY Left 06/21/2018   Procedure: LEFT EXTRACORPOREAL SHOCK WAVE LITHOTRIPSY (ESWL);  Surgeon: Franchot Gallo, MD;  Location: [facility] ORS;  Service: Urology;  Laterality: Left;  . OOPHORECTOMY  2002   left due to cyst  . TUBAL LIGATION  1996     OB  History    Gravida  3   Para  3   Term      Preterm      AB      Living  3     SAB      TAB      Ectopic      Multiple      Live Births               Home Medications    Prior to Admission medications   Medication Sig Start Date End Date Taking? Authorizing Provider  ibuprofen (ADVIL,MOTRIN) 800 MG tablet TAKE 1 TABLET BY MOUTH TWICE DAILY Patient taking differently: Take 800 mg by mouth twice daily as needed for moderate pain 06/28/18  Yes Henson, Vickie L, NP-C  levonorgestrel (MIRENA) 20 MCG/24HR IUD 1 each by Intrauterine route once. 12/22/13  Yes [provider]  oxyCODONE-acetaminophen (PERCOCET/ROXICET) 5-325 MG tablet Take 0.5 tablets by mouth every 4 (four) hours as needed for moderate pain or severe pain. 07/16/18  Yes McKenzie, Candee Furbish, MD  sulfamethoxazole-trimethoprim (BACTRIM DS,SEPTRA DS) 800-160 MG tablet Take 1 tablet by mouth 2 (two) times daily. 07/16/18  Yes McKenzie, Candee Furbish, MD    Family History Family History  Problem Relation Age of Onset  . Hypertension Mother   . Anemia Mother   . Colon polyps Mother   .  Cancer Father        lung  . Kidney Stones Father   . Hypertension Father   . Lung cancer Father 32  . Asthma Brother   . Asthma Daughter   . Allergies Daughter   . Asthma Daughter   . Hypertension Brother   . Breast cancer Neg Hx   . Colon cancer Neg Hx   . Diabetes Neg Hx   . Heart disease Neg Hx     Social History Social History   Tobacco Use  . Smoking status: Never Smoker  . Smokeless tobacco: Never Used  Substance Use Topics  . Alcohol use: No  . Drug use: No     Allergies   Shellfish allergy   Review of Systems Review of Systems  All other systems reviewed and are negative.    Physical Exam Updated Vital Signs BP (!) 171/96 (BP Location: Right Arm)   Pulse 94   Temp 98.3 F (36.8 C) (Oral)   Resp 18   Ht 5\' 3"  (1.6 m)   Wt 115.2 kg (254 lb)   SpO2 98%   BMI 44.99 kg/m   Physical  Exam  Constitutional: She is oriented to person, place, and time. She appears well-developed and well-nourished.  HENT:  Head: Normocephalic and atraumatic.  Eyes: Pupils are equal, round, and reactive to light. Conjunctivae and EOM are normal.  Neck: Normal range of motion. Neck supple.  Cardiovascular: Normal rate and regular rhythm. Exam reveals no gallop and no friction rub.  No murmur heard. Pulmonary/Chest: Effort normal and breath sounds normal. No respiratory distress. She has no wheezes. She has no rales. She exhibits no tenderness.  Abdominal: Soft. Bowel sounds are normal. She exhibits no distension and no mass. There is no tenderness. There is no rebound and no guarding.  Musculoskeletal: Normal range of motion. She exhibits no edema or tenderness.  Neurological: She is alert and oriented to person, place, and time.  Skin: Skin is warm and dry.  Psychiatric: She has a normal mood and affect. Her behavior is normal. Judgment and thought content normal.  Nursing note and vitals reviewed.    ED Treatments / Results  Labs (all labs ordered are listed, but only abnormal results are displayed) Labs Reviewed  CBC WITH DIFFERENTIAL/PLATELET  BASIC METABOLIC PANEL  URINALYSIS, ROUTINE W REFLEX MICROSCOPIC    EKG None  Radiology Dg C-arm 1-60 Min-no Report  Result Date: 07/16/2018 Fluoroscopy was utilized by the requesting physician.  No radiographic interpretation.    Procedures Procedures (including critical care time)  Medications Ordered in ED Medications  fentaNYL (SUBLIMAZE) injection 50 mcg (50 mcg Nasal Given 07/16/18 2026)  ondansetron (ZOFRAN-ODT) disintegrating tablet 4 mg (4 mg Oral Given 07/16/18 2026)     Initial Impression / Assessment and Plan / ED Course  I have reviewed the triage vital signs and the nursing notes.  Pertinent labs & imaging results that were available during my care of the patient were reviewed by me and considered in my medical  decision making (see chart for details).    Patient with ureteral stent placement today.  She complains of some leaking of urine and blood.  She read on her discharge paperwork that she was to come to the emergency department if this happened.  She states that her pain has been moderate to severe.  Will check labs, urine.  Anticipate urology consultation.  Laboratory work-up is reassuring.  I discussed case with Dr. Junious Silk, who recommends follow-up in the  clinic if the symptoms do not resolve over the weekend.   Final Clinical Impressions(s) / ED Diagnoses   Final diagnoses:  Post-operative pain    ED Discharge Orders    None       Montine Circle, PA-C 07/17/18 0601    Fatima Blank, MD 07/18/18 [id number]

## 2018-07-16 NOTE — Transfer of Care (Signed)
Immediate Anesthesia Transfer of Care Note  Patient: Vanessa Copeland  Procedure(s) Performed: CYSTOSCOPY WITH RETROGRADE PYELOGRAM, URETEROSCOPY AND STENT PLACEMENT (Left ) HOLMIUM LASER APPLICATION (Left )  Patient Location: PACU  Anesthesia Type:General  Level of Consciousness: sedated  Airway & Oxygen Therapy: Patient Spontanous Breathing and Patient connected to face mask oxygen  Post-op Assessment: Report given to RN and Post -op Vital signs reviewed and stable  Post vital signs: Reviewed and stable  Last Vitals:  Vitals Value Taken Time  BP    Temp    Pulse 89 07/16/2018  1:48 PM  Resp 19 07/16/2018  1:48 PM  SpO2 100 % 07/16/2018  1:48 PM  Vitals shown include unvalidated device data.  Last Pain:  Vitals:   07/16/18 1216  PainSc: 6       Patients Stated Pain Goal: 6 (94/71/25 2712)  Complications: No apparent anesthesia complications

## 2018-07-16 NOTE — OR Nursing (Signed)
Dr. Alyson Ingles took stone(s)

## 2018-07-16 NOTE — ED Triage Notes (Signed)
Patient had a stent placed in her kidneys today and kidney stones removed. Patient is having some scant bleeding. Patient is worried that something is going on wrong.

## 2018-07-16 NOTE — Op Note (Signed)
.  Preoperative diagnosis: Left ureteral stone  Postoperative diagnosis: Same  Procedure: 1 cystoscopy 2. Left retrograde pyelography 3.  Intraoperative fluoroscopy, under one hour, with interpretation 4.  Left ureteroscopic stone manipulation with laser lithotripsy 5.  Left 6 x 26 JJ stent placement  Attending: Rosie Fate  Anesthesia: General  Estimated blood loss: None  Drains: Left 6 x 26 JJ ureteral stent with tether  Specimens: stone for analysis  Antibiotics: ancef  Findings: left proximal ureteral stone. Moderate hydronephrosis. No masses/lesions in the bladder. Ureteral orifices in normal anatomic location.  Indications: Patient is a 52 year old female with a history of left  Ureteral calculus and who has persistent left flank pain after ESWL.  After discussing treatment options, she decided proceed with left ureteroscopic stone manipulation.  Procedure her in detail: The patient was brought to the operating room and a brief timeout was done to ensure correct patient, correct procedure, correct site.  General anesthesia was administered patient was placed in dorsal lithotomy position.  Her genitalia was then prepped and draped in usual sterile fashion.  A rigid 81 French cystoscope was passed in the urethra and the bladder.  Bladder was inspected free masses or lesions.  the ureteral orifices were in the normal orthotopic locations.  a 6 french ureteral catheter was then instilled into the left ureteral orifice.  a gentle retrograde was obtained and findings noted above.  we then placed a zip wire through the ureteral catheter and advanced up to the renal pelvis.  we then removed the cystoscope and cannulated the left ureteral orifice with a semirigid ureteroscope.  No stone was found in the ureter. Once we reached the UPJ a sensor wire was advanced in to the renal pelvis. We then removed the ureteroscope and advanced  12/14 x 35cm access sheath up to the UPJ. We then used a  flexible ureteroscope to perform nephroscopy.  We encountered the stone in the renal pelvis/UPJ. using a 200 nm laser fiber and fragmented the stone into smaller pieces.  the pieces were then removed with a Ngage basket. Once all stone fragments were removed we then removed the access sheath under direct vision and noted no injury to the ureter.  we then placed a 6 x 26 double-j ureteral stent over the original zip wire.  We removed the wire and good coil was noted in the the renal pelvis under fluoroscopy and the bladder under direct vision.    the bladder was then drained and this concluded the procedure which was well tolerated by patient.  Complications: None  Condition: Stable, extubated, transferred to PACU  Plan: Patient is to be discharged home as to follow-up in 2 weeks. She is to remove her stent in 72 hours by pulling the tether

## 2018-07-16 NOTE — Anesthesia Procedure Notes (Signed)
Procedure Name: LMA Insertion Date/Time: 07/16/2018 12:45 PM Performed by: Talbot Grumbling, CRNA Pre-anesthesia Checklist: Patient identified, Emergency Drugs available, Patient being monitored and Suction available Patient Re-evaluated:Patient Re-evaluated prior to induction Oxygen Delivery Method: Circle system utilized Preoxygenation: Pre-oxygenation with 100% oxygen Induction Type: IV induction Ventilation: Mask ventilation without difficulty LMA: LMA inserted LMA Size: 4.0 Number of attempts: 1 Placement Confirmation: positive ETCO2 and breath sounds checked- equal and bilateral Tube secured with: Tape Dental Injury: Teeth and Oropharynx as per pre-operative assessment

## 2018-07-16 NOTE — Anesthesia Preprocedure Evaluation (Addendum)
Anesthesia Evaluation  Patient identified by MRN, date of birth, ID band Patient awake    Reviewed: Allergy & Precautions, NPO status , Patient's Chart, lab work & pertinent test results  History of Anesthesia Complications (+) PONV and history of anesthetic complications  Airway Mallampati: II  TM Distance: >3 FB Neck ROM: Full    Dental no notable dental hx.    Pulmonary asthma (Allergy incuded) ,    Pulmonary exam normal breath sounds clear to auscultation       Cardiovascular negative cardio ROS Normal cardiovascular exam Rhythm:Regular Rate:Normal  ECG: NSR, rate 80   Neuro/Psych negative neurological ROS  negative psych ROS   GI/Hepatic negative GI ROS, Neg liver ROS,   Endo/Other  Morbid obesity  Renal/GU Renal disease     Musculoskeletal negative musculoskeletal ROS (+)   Abdominal (+) + obese,   Peds  Hematology  (+) anemia ,   Anesthesia Other Findings LEFT URETERAL CALCULUS  Reproductive/Obstetrics hcg negative                            Anesthesia Physical Anesthesia Plan  ASA: III  Anesthesia Plan: General   Post-op Pain Management:    Induction: Intravenous  PONV Risk Score and Plan: 4 or greater and Dexamethasone, Ondansetron, Midazolam and Treatment may vary due to age or medical condition  Airway Management Planned: LMA  Additional Equipment:   Intra-op Plan:   Post-operative Plan: Extubation in OR  Informed Consent: I have reviewed the patients History and Physical, chart, labs and discussed the procedure including the risks, benefits and alternatives for the proposed anesthesia with the patient or authorized representative who has indicated his/her understanding and acceptance.   Dental advisory given  Plan Discussed with: CRNA  Anesthesia Plan Comments:        Anesthesia Quick Evaluation

## 2018-07-16 NOTE — Interval H&P Note (Signed)
History and Physical Interval Note:  07/16/2018 12:27 PM  [name]  has presented today for surgery, with the diagnosis of LEFT URETERAL CALCULUS  The various methods of treatment have been discussed with the patient and family. After consideration of risks, benefits and other options for treatment, the patient has consented to  Procedure(s): CYSTOSCOPY WITH RETROGRADE PYELOGRAM, URETEROSCOPY AND STENT PLACEMENT (Left) HOLMIUM LASER APPLICATION (Left) as a surgical intervention .  The patient's history has been reviewed, patient examined, no change in status, stable for surgery.  I have reviewed the patient's chart and labs.  Questions were answered to the patient's satisfaction.     Vanessa Copeland

## 2018-07-17 LAB — URINALYSIS, ROUTINE W REFLEX MICROSCOPIC
BILIRUBIN URINE: NEGATIVE
Bacteria, UA: NONE SEEN
Glucose, UA: 50 mg/dL — AB
Ketones, ur: NEGATIVE mg/dL
Nitrite: NEGATIVE
PH: 6 (ref 5.0–8.0)
Protein, ur: 100 mg/dL — AB
RBC / HPF: >50 RBC/hpf — ABNORMAL HIGH (ref 0–5)
Specific Gravity, Urine: 1.017 (ref 1.005–1.030)

## 2018-07-17 LAB — BASIC METABOLIC PANEL
Anion gap: 11 (ref 5–15)
BUN: 13 mg/dL (ref 6–20)
CO2: 26 mmol/L (ref 22–32)
Calcium: 9.5 mg/dL (ref 8.9–10.3)
Chloride: 102 mmol/L (ref 98–111)
Creatinine, Ser: 0.84 mg/dL (ref 0.44–1.00)
GFR calc Af Amer: >60 mL/min (ref >60)
GFR calc non Af Amer: >60 mL/min (ref >60)
Glucose, Bld: 147 mg/dL — ABNORMAL HIGH (ref 70–99)
Potassium: 4.5 mmol/L (ref 3.5–5.1)
Sodium: 139 mmol/L (ref 135–145)

## 2018-07-17 MED ORDER — ONDANSETRON HCL 4 MG/2ML IJ SOLN
4.0000 mg | Freq: Once | INTRAMUSCULAR | Status: AC
  Administered 2018-07-17: 4 mg via INTRAVENOUS
  Filled 2018-07-17: qty 2

## 2018-07-17 MED ORDER — FENTANYL CITRATE (PF) 100 MCG/2ML IJ SOLN
25.0000 ug | Freq: Once | INTRAMUSCULAR | Status: AC
  Administered 2018-07-17: 25 ug via INTRAVENOUS
  Filled 2018-07-17: qty 2

## 2018-07-17 NOTE — Anesthesia Postprocedure Evaluation (Signed)
Anesthesia Post Note  Patient: Vanessa Copeland  Procedure(s) Performed: CYSTOSCOPY WITH RETROGRADE PYELOGRAM, URETEROSCOPY AND STENT PLACEMENT (Left ) HOLMIUM LASER APPLICATION (Left )     Patient location during evaluation: PACU Anesthesia Type: General Level of consciousness: awake and alert Pain management: pain level controlled Vital Signs Assessment: post-procedure vital signs reviewed and stable Respiratory status: spontaneous breathing, nonlabored ventilation, respiratory function stable and patient connected to nasal cannula oxygen Cardiovascular status: stable and blood pressure returned to baseline Postop Assessment: no apparent nausea or vomiting Anesthetic complications: no    Last Vitals:  Vitals:   07/16/18 1412 07/16/18 1430  BP:  (!) 170/90  Pulse: 82 79  Resp: 15 18  Temp: 36.5 C 36.9 C  SpO2: 100% 95%    Last Pain:  Vitals:   07/16/18 1412  PainSc: 0-No pain                 Ryan P Ellender

## 2018-07-18 ENCOUNTER — Encounter (HOSPITAL_COMMUNITY): Payer: Self-pay | Admitting: Urology

## 2018-07-19 ENCOUNTER — Ambulatory Visit: Payer: BLUE CROSS/BLUE SHIELD | Admitting: Family Medicine

## 2018-07-19 ENCOUNTER — Emergency Department (HOSPITAL_COMMUNITY)
Admission: EM | Admit: 2018-07-19 | Discharge: 2018-07-19 | Disposition: A | Discharge status: Home / Self Care | Payer: BLUE CROSS/BLUE SHIELD | Attending: Emergency Medicine | Admitting: Emergency Medicine

## 2018-07-19 ENCOUNTER — Encounter (HOSPITAL_COMMUNITY): Payer: Self-pay

## 2018-07-19 ENCOUNTER — Other Ambulatory Visit: Payer: Self-pay

## 2018-07-19 ENCOUNTER — Emergency Department (HOSPITAL_COMMUNITY): Payer: BLUE CROSS/BLUE SHIELD

## 2018-07-19 DIAGNOSIS — M2420 Disorder of ligament, unspecified site: Secondary | ICD-10-CM | POA: Diagnosis not present

## 2018-07-19 DIAGNOSIS — M5442 Lumbago with sciatica, left side: Secondary | ICD-10-CM | POA: Insufficient documentation

## 2018-07-19 DIAGNOSIS — J45909 Unspecified asthma, uncomplicated: Secondary | ICD-10-CM | POA: Insufficient documentation

## 2018-07-19 DIAGNOSIS — S3992XA Unspecified injury of lower back, initial encounter: Secondary | ICD-10-CM | POA: Diagnosis not present

## 2018-07-19 DIAGNOSIS — M4600 Spinal enthesopathy, site unspecified: Secondary | ICD-10-CM

## 2018-07-19 DIAGNOSIS — M2428 Disorder of ligament, vertebrae: Secondary | ICD-10-CM

## 2018-07-19 DIAGNOSIS — M545 Low back pain: Secondary | ICD-10-CM | POA: Diagnosis not present

## 2018-07-19 MED ORDER — DIAZEPAM 5 MG PO TABS
ORAL_TABLET | ORAL | 0 refills | Status: DC
Start: 2018-07-19 — End: 2018-07-29

## 2018-07-19 MED ORDER — KETOROLAC TROMETHAMINE 10 MG PO TABS
10.0000 mg | ORAL_TABLET | Freq: Once | ORAL | Status: DC
  Filled 2018-07-19: qty 1

## 2018-07-19 MED ORDER — IBUPROFEN 800 MG PO TABS: 800.0000 mg | ORAL_TABLET | Freq: Three times a day (TID) | ORAL | 0 refills | Status: DC

## 2018-07-19 MED ORDER — KETOROLAC TROMETHAMINE 30 MG/ML IJ SOLN
30.0000 mg | Freq: Once | INTRAMUSCULAR | Status: AC
  Administered 2018-07-19: 30 mg via INTRAVENOUS
  Filled 2018-07-19: qty 1

## 2018-07-19 MED ORDER — DIAZEPAM 5 MG/ML IJ SOLN
10.0000 mg | Freq: Once | INTRAMUSCULAR | Status: AC
  Administered 2018-07-19: 10 mg via INTRAVENOUS
  Filled 2018-07-19: qty 2

## 2018-07-19 MED ORDER — DEXAMETHASONE SODIUM PHOSPHATE 10 MG/ML IJ SOLN
10.0000 mg | Freq: Once | INTRAMUSCULAR | Status: AC
  Administered 2018-07-19: 10 mg via INTRAVENOUS
  Filled 2018-07-19: qty 1

## 2018-07-19 MED ORDER — PREDNISONE 10 MG (21) PO TBPK: ORAL_TABLET | ORAL | 0 refills | Status: DC

## 2018-07-19 NOTE — ED Triage Notes (Signed)
Patient complains of lower back pain since picking up heavy object last Tuesday. Pain with change in position and ROM. Has hx of kidney stone but reports that this is muscular

## 2018-07-19 NOTE — Discharge Instructions (Addendum)
Thank you for allowing me to care for you today in the Emergency Department.   Starting tomorrow, take 6 tabs of prednisone by mouth daily for 2 days, then 5 tabs x2 days, then 4 tabs x2 days, then 3 tabs x2 days, 2 tabs x2 days, then 1 tab x2 days.  You were given a dose of Decadron, which is like prednisone today in the emergency department symmetry do not start this until tomorrow.  Take 800 mg of ibuprofen with food or at thousand milligrams of Tylenol every 8 hours for pain control.  You can also alternate between these 2 medications every 4 hours.  Take 1 to 2 tablets of Valium every 6-8 hours for muscle pain and spasms.  Do not take this medication if after work or drive because it can make you drowsy.  Avoid alcohol while taking this medication and use caution if you take a home Percocet.  You can also apply ice for 15 to 20 minutes up to 3-4 times a day.  Start to stretch gently as your pain allows.  Call to schedule a follow-up appointment with [facility] if your symptoms do not improve in the next few days.  You can also follow-up with your primary care provider if you are unable to get an appointment for an extended amount of time.  Return to the emergency department if you develop symptoms such as peeing or pooping on yourself, numbness around to the rectum of the vagina, new weakness, or high fever, or other new, concerning symptoms.

## 2018-07-19 NOTE — ED Provider Notes (Signed)
[facility] EMERGENCY DEPARTMENT Provider Note   CSN: [id number] Arrival date & time: 07/19/18  5784     History   Chief Complaint No chief complaint on file.   HPI Vanessa Copeland is a 52 y.o. female with a history of nephrolithiasis s/p ureteral stent, and obesity who presents to the emergency department with a chief complaint of left-sided back pain that radiates down the left leg to the thigh for 6 days that began while lifting a case of sodas at [employer].  The pain was manageable until this AM when it suddenly worsened after she leaned over to pull some clothes out of the dryer.  Pain is 10/10. Pain is worse with sitting, laying down, and positional changes. She has been taking ibuprofen and Percocet from her urologist, Tiger's balm and heating pad with no improvement. No fever, chills, SOB, dysuria, vaginal discharge, abdominal pain, nausea, vomiting, diarrhea, or constipation.  She denies weakness, urinary or fecal incontinence, or saddle paresthesias.  The history is provided by the patient. No language interpreter was used.    Past Medical History:  Diagnosis Date  . Allergy   . Anemia   . Asthma    mild intermittent(when allergies are flaring)  . Complication of anesthesia   . H/O echocardiogram 12/2005   Trace MR, mild TR, nl L ventricular systolic function.  Marland Kitchen Heart murmur    since birth. Never has had any problems  . History of kidney stones   . Kidney stones   . Obesity, unspecified   . PONV (postoperative nausea and vomiting)   . Vitamin D deficiency 01/2010    Patient Active Problem List   Diagnosis Date Noted  . Vitamin D deficiency 10/21/2016  . Anemia 10/21/2016  . Screening for STD (sexually transmitted disease) 10/21/2016  . Allergic rhinitis 10/22/2012    Past Surgical History:  Procedure Laterality Date  . CESAREAN SECTION     x3  . CYSTOSCOPY WITH RETROGRADE PYELOGRAM, URETEROSCOPY AND STENT PLACEMENT Left 07/16/2018   Procedure: CYSTOSCOPY WITH RETROGRADE PYELOGRAM, URETEROSCOPY AND STENT PLACEMENT;  Surgeon: Cleon Gustin, MD;  Location: [facility];  Service: Urology;  Laterality: Left;  . EXTRACORPOREAL SHOCK WAVE LITHOTRIPSY Left 06/21/2018   Procedure: LEFT EXTRACORPOREAL SHOCK WAVE LITHOTRIPSY (ESWL);  Surgeon: Franchot Gallo, MD;  Location: [facility];  Service: Urology;  Laterality: Left;  . HOLMIUM LASER APPLICATION Left 6/96/2952   Procedure: HOLMIUM LASER APPLICATION;  Surgeon: Cleon Gustin, MD;  Location: [facility];  Service: Urology;  Laterality: Left;  . OOPHORECTOMY  2002   left due to cyst  . TUBAL LIGATION  1996     OB History    Gravida  3   Para  3   Term      Preterm      AB      Living  3     SAB      TAB      Ectopic      Multiple      Live Births               Home Medications    Prior to Admission medications   Medication Sig Start Date End Date Taking? Authorizing Provider  diazepam (VALIUM) 5 MG tablet Take 1-2 tablets every 6-8 hours as needed. 07/19/18   Nasiir Monts A, PA-C  ibuprofen (ADVIL,MOTRIN) 800 MG tablet Take 1 tablet (800 mg total) by mouth 3 (three) times daily. 07/19/18   Joline Maxcy  A, PA-C  levonorgestrel (MIRENA) 20 MCG/24HR IUD 1 each by Intrauterine route once. 12/22/13   [provider]  oxyCODONE-acetaminophen (PERCOCET/ROXICET) 5-325 MG tablet Take 0.5 tablets by mouth every 4 (four) hours as needed for moderate pain or severe pain. 07/16/18   McKenzie, Candee Furbish, MD  predniSONE (STERAPRED UNI-PAK 21 TAB) 10 MG (21) TBPK tablet Take 6 tabs by mouth daily for 2 days, then 5 tabs x2 days, then 4 tabs x2 days, then 3 tabs x2 days, 2 tabs x2 days, then 1 tab x2 days 07/19/18   Carrol Hougland A, PA-C  sulfamethoxazole-trimethoprim (BACTRIM DS,SEPTRA DS) 800-160 MG tablet Take 1 tablet by mouth 2 (two) times daily. 07/16/18   McKenzie, Candee Furbish, MD    Family History Family History  Problem Relation Age of Onset  .  Hypertension Mother   . Anemia Mother   . Colon polyps Mother   . Cancer Father        lung  . Kidney Stones Father   . Hypertension Father   . Lung cancer Father 43  . Asthma Brother   . Asthma Daughter   . Allergies Daughter   . Asthma Daughter   . Hypertension Brother   . Breast cancer Neg Hx   . Colon cancer Neg Hx   . Diabetes Neg Hx   . Heart disease Neg Hx     Social History Social History   Tobacco Use  . Smoking status: Never Smoker  . Smokeless tobacco: Never Used  Substance Use Topics  . Alcohol use: No  . Drug use: No     Allergies   Shellfish allergy   Review of Systems Review of Systems  Constitutional: Negative for activity change, chills and fever.  Respiratory: Negative for shortness of breath.   Cardiovascular: Negative for chest pain.  Gastrointestinal: Negative for abdominal pain, diarrhea and nausea.  Musculoskeletal: Positive for arthralgias, back pain, gait problem and myalgias. Negative for joint swelling, neck pain and neck stiffness.  Skin: Negative for rash.  Neurological: Positive for numbness. Negative for dizziness and headaches.     Physical Exam Updated Vital Signs BP 139/89   Pulse 82   Temp 98 F (36.7 C) (Oral)   Resp 16   SpO2 99%   Physical Exam  Constitutional: No distress.  HENT:  Head: Normocephalic.  Eyes: Conjunctivae are normal.  Neck: Neck supple.  Cardiovascular: Normal rate, regular rhythm, normal heart sounds and intact distal pulses. Exam reveals no gallop and no friction rub.  No murmur heard. Pulmonary/Chest: Effort normal. No stridor. No respiratory distress. She has no wheezes. She has no rales. She exhibits no tenderness.  Abdominal: Soft. She exhibits no distension.  Musculoskeletal:  Positive bilateral straight leg raise, left greater than right.  Straight leg raise is positive at 15 degrees on the left and 25 degrees on the right.  She has mild tenderness to the spinous processes of the lumbar  spine with bilateral paraspinal muscle tenderness.  She is also tender to palpation over the left buttock and left anterolateral thigh.  Exam is somewhat limited secondary to the patient's body habitus.  Antalgic gait.  She is able to bear weight on the bilateral lower extremities.  Sensation is intact and symmetric throughout the bilateral lower extremities.  DP and PT pulses are 2+ and symmetric.  No cervical or thoracic spinous process tenderness.  Full active and passive range of motion of all joints of the bilateral lower extremities.  Neurological: She is  alert.  Skin: Skin is warm. No rash noted.  Psychiatric: Her behavior is normal.  Nursing note and vitals reviewed.  ED Treatments / Results  Labs (all labs ordered are listed, but only abnormal results are displayed) Labs Reviewed - No data to display  EKG None  Radiology Ct Lumbar Spine Wo Contrast  Result Date: 07/19/2018 CLINICAL DATA:  The patient suffered a low back injury lifting a case of soft drinks 07/13/2018 and a subsequent unspecified injury today. Low back pain which is worse on the left and radiates into the left leg. EXAM: CT LUMBAR SPINE WITHOUT CONTRAST TECHNIQUE: Multidetector CT imaging of the lumbar spine was performed without intravenous contrast administration. Multiplanar CT image reconstructions were also generated. COMPARISON:  CT abdomen and pelvis 04/21/2018. FINDINGS: Segmentation: Standard. Alignment: Normal. Vertebrae: No fracture or worrisome lesion. Paraspinal and other soft tissues: The patient has 3 nonobstructing right renal stones measuring up to 0.4 cm. 3 nonobstructing left renal stones are also seen measuring up to 0.8 cm. There is mild left hydronephrosis which is new since the prior examination. No ureteral stone is seen but the left ureter is not imaged in its entirety. Disc levels: T11-12: Negative. T12-L1: Negative. L1-2: Negative. L2-3: Negative. L3-4: Negative. L4-5: Minimal disc bulge.  No  stenosis. L5-S1: Minimal disc bulge and mild ligamentum flavum thickening. No stenosis. IMPRESSION: Nonobstructing bilateral renal stones. There is mild left hydronephrosis which is new since the prior examination. Cause for hydronephrosis is not identified but the left ureter is not imaged all the way to the ureterovesical junction. Negative lumbar spine. Electronically Signed   By: Inge Rise M.D.   On: 07/19/2018 12:42    Procedures Procedures (including critical care time)  Medications Ordered in ED Medications  diazepam (VALIUM) injection 10 mg (10 mg Intravenous Given 07/19/18 1200)  ketorolac (TORADOL) 30 MG/ML injection 30 mg (30 mg Intravenous Given 07/19/18 1206)  dexamethasone (DECADRON) injection 10 mg (10 mg Intravenous Given 07/19/18 1314)     Initial Impression / Assessment and Plan / ED Course  I have reviewed the triage vital signs and the nursing notes.  Pertinent labs & imaging results that were available during my care of the patient were reviewed by me and considered in my medical decision making (see chart for details).     52 year old female with a history of nephrolithiasis s/p ureteral stent, and obesity who presents to the emergency department from home with a chief complaint of low back pain that radiates down her left leg after mechanical injury.  The patient underwent a cystoscopy with left ureteral stent placement on 07/16/2018 by Dr. Alyson Ingles.  She has follow-up with urology in 2 weeks.  She states that this pain is very different from pain from her nephrolithiasis.  On exam, she has bilateral straight leg raise, left greater than right.  No red flags on exam including fever, saddle paresthesias, and urinary or fecal incontinence.  She is endorsing some muscle spasms and Valium has been given along with Toradol for pain control since the patient has previously taken Tylenol and Percocet at home this morning.  CT lumbar spine with minimal disc bulging at L4-L5  and L5-S1.  There is also mild ligamentum flavum thickening at L5-S1.  On reevaluation, the patient pain has significantly improved with Valium.  She is still endorsing some mild pain, but appears much more comfortable.  We will discharge the patient with a short course of Valium and prednisone pack.  A 33-month prescription history  query was performed using the [place] CSRS prior to discharge. She has been given a referral to neurosurgery for follow-up.  Strict return precautions given.  She is hemodynamically stable in no acute distress.  She is safe for discharge home with outpatient follow-up at this time.  Final Clinical Impressions(s) / ED Diagnoses   Final diagnoses:  Hypertrophy of ligamentum flavum (HCC)  Acute midline low back pain with left-sided sciatica    ED Discharge Orders        Ordered    predniSONE (STERAPRED UNI-PAK 21 TAB) 10 MG (21) TBPK tablet     07/19/18 1320    diazepam (VALIUM) 5 MG tablet     07/19/18 1320    ibuprofen (ADVIL,MOTRIN) 800 MG tablet  3 times daily     07/19/18 1320       Reegan Bouffard A, PA-C 07/19/18 1336    Daleen Bo, MD 07/21/18 1142

## 2018-07-19 NOTE — ED Notes (Signed)
Patient verbalizes understanding of discharge instructions. Opportunity for questioning and answers were provided. Armband removed by staff, pt discharged from ED.  

## 2018-07-23 DIAGNOSIS — N201 Calculus of ureter: Secondary | ICD-10-CM | POA: Diagnosis not present

## 2018-07-29 ENCOUNTER — Ambulatory Visit (INDEPENDENT_AMBULATORY_CARE_PROVIDER_SITE_OTHER): Payer: BLUE CROSS/BLUE SHIELD | Admitting: Family Medicine

## 2018-07-29 ENCOUNTER — Encounter: Payer: Self-pay | Admitting: Family Medicine

## 2018-07-29 VITALS — BP 120/82 | HR 76 | Temp 98.6°F | Wt 247.6 lb

## 2018-07-29 DIAGNOSIS — R200 Anesthesia of skin: Secondary | ICD-10-CM | POA: Diagnosis not present

## 2018-07-29 DIAGNOSIS — M5442 Lumbago with sciatica, left side: Secondary | ICD-10-CM

## 2018-07-29 DIAGNOSIS — M79662 Pain in left lower leg: Secondary | ICD-10-CM

## 2018-07-29 MED ORDER — KETOROLAC TROMETHAMINE 30 MG/ML IJ SOLN
60.0000 mg | Freq: Once | INTRAMUSCULAR | Status: AC
  Administered 2018-07-29: 60 mg via INTRAMUSCULAR

## 2018-07-29 MED ORDER — DIAZEPAM 5 MG PO TABS: ORAL_TABLET | ORAL | 0 refills | Status: DC

## 2018-07-29 NOTE — Progress Notes (Signed)
Subjective:    Patient ID: Vanessa Copeland, female    DOB: 12-08-1966, 52 y.o.   MRN: [id number]  HPI Chief Complaint  Patient presents with  . ER follow-up    ER from back pain- sciatica. still throbbing. going to see a neurologist next thursday but still having pain   She is here with complaints of low back pain and left lower leg pain as well as decreased sensation of her left anterior lower leg. Reports injury. States she picked up a case of soda and felt pain immediately in her low back. States she re-injured her back a couple of days later by picking up a basket of laundry. Some numnbess from below the knee to just above her left ankle. Reports severe pain in that area with walking. Pain is aching and throbbing. Sitting down relieves the pain. Reports having throbbing at night also. Mild dull pain in left low back that is radiating down her posterior left upper leg as times.   She was seen in the ED on 07/19/2018 for the same issue. She was prescribed steroids which she reports she is still taking. Also prescribed Valium and states this does not help very much. She is taking Oxycodone intermittently with some relief. She has this at home.  States she has been taking ibuprofen 800 mg bid. She has an appointment with [facility] next week.   Denies any new or worsening symptoms.  Denies fever, chills, weakness, chest pain, abdominal pain, N/V/D. No saddle anesthesia, loss of control of bowels or bladder. Denies any urinary symptoms.   Recent procedure for renal stones.  States she saw Dr. Alyson Ingles last week for a follow up and had a good report.    Reviewed allergies, medications, past medical, surgical, family, and social history.   Review of Systems Pertinent positives and negatives in the history of present illness.     Objective:   Physical Exam  Constitutional: She is oriented to person, place, and time. She appears well-developed and well-nourished. No distress.    HENT:  Mouth/Throat: Oropharynx is clear and moist.  Eyes: Pupils are equal, round, and reactive to light. Conjunctivae and EOM are normal.  Neck: Normal range of motion. Neck supple.  Cardiovascular: Normal rate, regular rhythm and intact distal pulses.  Pulmonary/Chest: Effort normal and breath sounds normal.  Abdominal: Soft. She exhibits no distension.  Musculoskeletal: Normal range of motion.       Thoracic back: Normal.       Lumbar back: She exhibits tenderness and pain. She exhibits normal range of motion.       Back:  Lumbar paraspinal muscles TTP on left.  Straight leg raise positive at 15 degrees on left, negative on right.  Good curvature and ROM. Pain with extreme flexion and hyperextension.   Neurological: She is alert and oriented to person, place, and time. She has normal strength and normal reflexes. A sensory deficit is present. No cranial nerve deficit. She exhibits normal muscle tone. Gait normal.  Decreased sensation to left anterior lower leg from below her knee and just above her ankle but is able to discern sharp from soft.   Skin: Skin is warm and dry. Capillary refill takes less than 2 seconds. No rash noted. No pallor.  Psychiatric: She has a normal mood and affect. Her speech is normal and behavior is normal. Thought content normal.   BP 120/82   Pulse 76   Temp 98.6 F (37 C) (Oral)   Wt  247 lb 9.6 oz (112.3 kg)   SpO2 98%   BMI 43.86 kg/m         Assessment & Plan:  Pain in left lower leg - Plan: ketorolac (TORADOL) 30 MG/ML injection 60 mg  Acute left-sided low back pain with left-sided sciatica - Plan: diazepam (VALIUM) 5 MG tablet, ketorolac (TORADOL) 30 MG/ML injection 60 mg  Numbness in left leg - Plan: ketorolac (TORADOL) 30 MG/ML injection 60 mg  No new or worsening symptoms. No red flag symptoms.  Toradol 60 mg IM given. She did report some relief shortly after injection and no side effects.  Will refill Valium and have her take  ibuprofen 800 mg tid. She may also take Tylenol.  Use heat or ice and topical analgesic if she prefers.  Called to see if [facility] could see her sooner than next week but they cannot. She will keep her appointment with them. In the meantime, if symptoms worsen she will go to the ED.

## 2018-07-29 NOTE — Patient Instructions (Signed)
Take 800 mg ibuprofen three times daily. You may also add Tylenol. Use heat or ice and take the Valium as needed for spasm.

## 2018-08-02 DIAGNOSIS — N201 Calculus of ureter: Secondary | ICD-10-CM | POA: Diagnosis not present

## 2018-08-05 DIAGNOSIS — M5126 Other intervertebral disc displacement, lumbar region: Secondary | ICD-10-CM | POA: Diagnosis not present

## 2018-08-06 ENCOUNTER — Other Ambulatory Visit: Payer: Self-pay | Admitting: Neurosurgery

## 2018-08-06 DIAGNOSIS — M5126 Other intervertebral disc displacement, lumbar region: Secondary | ICD-10-CM

## 2018-08-07 ENCOUNTER — Ambulatory Visit
Admission: RE | Admit: 2018-08-07 | Discharge: 2018-08-07 | Disposition: A | Discharge status: Home / Self Care | Payer: BLUE CROSS/BLUE SHIELD | Source: Ambulatory Visit | Attending: Neurosurgery | Admitting: Neurosurgery

## 2018-08-07 DIAGNOSIS — M5126 Other intervertebral disc displacement, lumbar region: Secondary | ICD-10-CM

## 2018-08-07 DIAGNOSIS — M545 Low back pain: Secondary | ICD-10-CM | POA: Diagnosis not present

## 2018-08-09 ENCOUNTER — Telehealth: Payer: Self-pay | Admitting: Family Medicine

## 2018-08-09 MED ORDER — IBUPROFEN 800 MG PO TABS: 800.0000 mg | ORAL_TABLET | Freq: Three times a day (TID) | ORAL | 0 refills | Status: DC

## 2018-08-09 NOTE — Telephone Encounter (Signed)
done

## 2018-08-09 NOTE — Telephone Encounter (Signed)
Pt called for 800 mg Ibuprofen to [facility] on [address]. Pt did see the neurologist but waiting results.

## 2018-08-09 NOTE — Telephone Encounter (Signed)
Ok to give 30 day refill.

## 2018-08-11 DIAGNOSIS — M5126 Other intervertebral disc displacement, lumbar region: Secondary | ICD-10-CM | POA: Diagnosis not present

## 2018-08-20 DIAGNOSIS — M5126 Other intervertebral disc displacement, lumbar region: Secondary | ICD-10-CM | POA: Diagnosis not present

## 2018-08-20 DIAGNOSIS — M5116 Intervertebral disc disorders with radiculopathy, lumbar region: Secondary | ICD-10-CM | POA: Diagnosis not present

## 2018-09-06 ENCOUNTER — Other Ambulatory Visit: Payer: Self-pay | Admitting: Family Medicine

## 2018-09-06 NOTE — Telephone Encounter (Signed)
Called patient to see if this was an auto refill or not? She said she is in need of this medication. She just had back surgery and is done with the narcotic medication given by neurosurgeon and the ibuprofen has been helping.

## 2018-09-08 DIAGNOSIS — Z1231 Encounter for screening mammogram for malignant neoplasm of breast: Secondary | ICD-10-CM | POA: Diagnosis not present

## 2018-09-08 LAB — HM MAMMOGRAPHY

## 2018-09-09 ENCOUNTER — Ambulatory Visit (INDEPENDENT_AMBULATORY_CARE_PROVIDER_SITE_OTHER): Payer: BLUE CROSS/BLUE SHIELD | Admitting: Family Medicine

## 2018-09-09 ENCOUNTER — Telehealth: Payer: Self-pay | Admitting: Family Medicine

## 2018-09-09 ENCOUNTER — Encounter: Payer: Self-pay | Admitting: Family Medicine

## 2018-09-09 VITALS — BP 140/90 | HR 80 | Wt 245.8 lb

## 2018-09-09 DIAGNOSIS — Z23 Encounter for immunization: Secondary | ICD-10-CM

## 2018-09-09 DIAGNOSIS — R030 Elevated blood-pressure reading, without diagnosis of hypertension: Secondary | ICD-10-CM | POA: Diagnosis not present

## 2018-09-09 MED ORDER — EPINEPHRINE 0.3 MG/0.3ML IJ SOAJ: 0.3000 mg | Freq: Once | INTRAMUSCULAR | 0 refills | Status: AC

## 2018-09-09 NOTE — Telephone Encounter (Signed)
Pt forgot to mention in her appointment that she need an updated refill on her Epi Pen that she uses for shell fish allergies

## 2018-09-09 NOTE — Patient Instructions (Addendum)
You can check with your insurance company and see if Vanessa Copeland is affordable and let me know.   Start using a free app such as My Fitness Pal. Cut back on calories and carbohydrates. Don't drink your calories. But back on potatoes, rice, pasta, breads.   You will receive a call from Vanessa Copeland your BP at home and bring in your machine and readings in 4 weeks. Normal BP is <130/80.    DASH Eating Plan DASH stands for "Dietary Approaches to Stop Hypertension." The DASH eating plan is a healthy eating plan that has been shown to reduce high blood pressure (hypertension). It may also reduce your risk for type 2 diabetes, heart disease, and stroke. The DASH eating plan may also help with weight loss. What are tips for following this plan? General guidelines  Avoid eating more than 2,300 mg (milligrams) of salt (sodium) a day. If you have hypertension, you may need to reduce your sodium intake to 1,500 mg a day.  Limit alcohol intake to no more than 1 drink a day for nonpregnant women and 2 drinks a day for men. One drink equals 12 oz of beer, 5 oz of wine, or 1 oz of hard liquor.  Work with your health care provider to maintain a healthy body weight or to lose weight. Ask what an ideal weight is for you.  Get at least 30 minutes of exercise that causes your heart to beat faster (aerobic exercise) most days of the week. Activities may include walking, swimming, or biking.  Work with your health care provider or diet and nutrition specialist (dietitian) to adjust your eating plan to your individual calorie needs. Reading food labels  Check food labels for the amount of sodium per serving. Choose foods with less than 5 percent of the Daily Value of sodium. Generally, foods with less than 300 mg of sodium per serving fit into this eating plan.  To find whole grains, look for the word "whole" as the first word in the ingredient list. Shopping  Buy products labeled as "low-sodium" or "no  salt added."  Buy fresh foods. Avoid canned foods and premade or frozen meals. Cooking  Avoid adding salt when cooking. Use salt-free seasonings or herbs instead of table salt or sea salt. Check with your health care provider or pharmacist before using salt substitutes.  Do not fry foods. Cook foods using healthy methods such as baking, boiling, grilling, and broiling instead.  Cook with heart-healthy oils, such as olive, canola, soybean, or sunflower oil. Meal planning   Eat a balanced diet that includes: ? 5 or more servings of fruits and vegetables each day. At each meal, try to fill half of your plate with fruits and vegetables. ? Up to 6-8 servings of whole grains each day. ? Less than 6 oz of lean meat, poultry, or fish each day. A 3-oz serving of meat is about the same size as a deck of cards. One egg equals 1 oz. ? 2 servings of low-fat dairy each day. ? A serving of nuts, seeds, or beans 5 times each week. ? Heart-healthy fats. Healthy fats called Omega-3 fatty acids are found in foods such as flaxseeds and coldwater fish, like sardines, salmon, and mackerel.  Limit how much you eat of the following: ? Canned or prepackaged foods. ? Food that is high in trans fat, such as fried foods. ? Food that is high in saturated fat, such as fatty meat. ? Sweets, desserts, sugary drinks,  and other foods with added sugar. ? Full-fat dairy products.  Do not salt foods before eating.  Try to eat at least 2 vegetarian meals each week.  Eat more home-cooked food and less restaurant, buffet, and fast food.  When eating at a restaurant, ask that your food be prepared with less salt or no salt, if possible. What foods are recommended? The items listed may not be a complete list. Talk with your dietitian about what dietary choices are best for you. Grains Whole-grain or whole-wheat bread. Whole-grain or whole-wheat pasta. Brown rice. Vanessa Copeland. Bulgur. Whole-grain and low-sodium  cereals. Pita bread. Low-fat, low-sodium crackers. Whole-wheat flour tortillas. Vegetables Fresh or frozen vegetables (raw, steamed, roasted, or grilled). Low-sodium or reduced-sodium tomato and vegetable juice. Low-sodium or reduced-sodium tomato sauce and tomato paste. Low-sodium or reduced-sodium canned vegetables. Fruits All fresh, dried, or frozen fruit. Canned fruit in natural juice (without added sugar). Meat and other protein foods Skinless chicken or Vanessa Copeland. Ground chicken or Vanessa Copeland. Pork with fat trimmed off. Fish and seafood. Egg whites. Dried beans, peas, or lentils. Unsalted nuts, nut butters, and seeds. Unsalted canned beans. Lean cuts of beef with fat trimmed off. Low-sodium, lean deli meat. Dairy Low-fat (1%) or fat-free (skim) milk. Fat-free, low-fat, or reduced-fat cheeses. Nonfat, low-sodium ricotta or cottage cheese. Low-fat or nonfat yogurt. Low-fat, low-sodium cheese. Fats and oils Soft margarine without trans fats. Vegetable oil. Low-fat, reduced-fat, or light mayonnaise and salad dressings (reduced-sodium). Canola, safflower, olive, soybean, and sunflower oils. Avocado. Seasoning and other foods Herbs. Spices. Seasoning mixes without salt. Unsalted popcorn and pretzels. Fat-free sweets. What foods are not recommended? The items listed may not be a complete list. Talk with your dietitian about what dietary choices are best for you. Grains Baked goods made with fat, such as croissants, muffins, or some breads. Dry pasta or rice meal packs. Vegetables Creamed or fried vegetables. Vegetables in a cheese sauce. Regular canned vegetables (not low-sodium or reduced-sodium). Regular canned tomato sauce and paste (not low-sodium or reduced-sodium). Regular tomato and vegetable juice (not low-sodium or reduced-sodium). Vanessa Copeland. Olives. Fruits Canned fruit in a light or heavy syrup. Fried fruit. Fruit in cream or butter sauce. Meat and other protein foods Fatty cuts of meat. Ribs.  Fried meat. Vanessa Copeland. Sausage. Bologna and other processed lunch meats. Salami. Fatback. Hotdogs. Bratwurst. Salted nuts and seeds. Canned beans with added salt. Canned or smoked fish. Whole eggs or egg yolks. Chicken or Vanessa Copeland with skin. Dairy Whole or 2% milk, cream, and half-and-half. Whole or full-fat cream cheese. Whole-fat or sweetened yogurt. Full-fat cheese. Nondairy creamers. Whipped toppings. Processed cheese and cheese spreads. Fats and oils Butter. Stick margarine. Lard. Shortening. Ghee. Bacon fat. Tropical oils, such as coconut, palm kernel, or palm oil. Seasoning and other foods Salted popcorn and pretzels. Onion salt, garlic salt, seasoned salt, table salt, and sea salt. Worcestershire sauce. Tartar sauce. Barbecue sauce. Teriyaki sauce. Soy sauce, including reduced-sodium. Steak sauce. Canned and packaged gravies. Fish sauce. Oyster sauce. Cocktail sauce. Horseradish that you find on the shelf. Ketchup. Mustard. Meat flavorings and tenderizers. Bouillon cubes. Hot sauce and Tabasco sauce. Premade or packaged marinades. Premade or packaged taco seasonings. Relishes. Regular salad dressings. Where to find more information:  National Heart, Lung, and [place]: https://wilson-eaton.com/  American Heart Association: www.heart.org Summary  The DASH eating plan is a healthy eating plan that has been shown to reduce high blood pressure (hypertension). It may also reduce your risk for type 2 diabetes, heart disease, and stroke.  With the DASH eating plan, you should limit salt (sodium) intake to 2,300 mg a day. If you have hypertension, you may need to reduce your sodium intake to 1,500 mg a day.  When on the DASH eating plan, aim to eat more fresh fruits and vegetables, whole grains, lean proteins, low-fat dairy, and heart-healthy fats.  Work with your health care provider or diet and nutrition specialist (dietitian) to adjust your eating plan to your individual calorie needs. This  information is not intended to replace advice given to you by your health care provider. Make sure you discuss any questions you have with your health care provider. Document Released: 11/27/2011 Document Revised: 12/01/2016 Document Reviewed: 12/01/2016 Elsevier Interactive Patient Education  [employer].

## 2018-09-09 NOTE — Telephone Encounter (Signed)
I will take care of it  Thanks

## 2018-09-09 NOTE — Progress Notes (Signed)
   Subjective:    Patient ID: Vanessa Copeland, female    DOB: May 15, 1966, 52 y.o.   MRN: [id number]  HPI Chief Complaint  Patient presents with  . follow-up    follow-up on bp. would like another epipen for shellfish allergies   She is here with concerns regarding her BP being elevated recently without a history of HTN.  States she had back surgery in August and her BP was elevated before and after her surgery. She is concerned about her weight. States she lost 20 lbs when she was having back pain but recently gained it all back. She reports having tried multiple diets in the past without success.   Denies fever, chills, dizziness, chest pain, palpitations, shortness of breath, abdominal pain, N/V/D, urinary symptoms.   Reviewed allergies, medications, past medical, surgical, family, and social history.    Review of Systems Pertinent positives and negatives in the history of present illness.     Objective:   Physical Exam BP 140/90   Pulse 80   Wt 245 lb 12.8 oz (111.5 kg)   BMI 43.54 kg/m   Alert and oriented and in no acute distress. Not otherwise examined.       Assessment & Plan:  Elevated BP without diagnosis of hypertension  Morbid obesity ([place])  Needs flu shot - Plan: Flu Vaccine QUAD 36+ mos IM  She will check her BP at home. Discussed healthy diet and lifestyle modifications to control BP. Recommend reducing salt in diet and increasing physical activity.  DASH diet handout given.  She will return in 4 weeks with her machine and readings.  Counseling done on weight loss. She will try My Fitness Pal. Check on Saxenda with insurance. Consider [facility] weight loss study. If she declines these options we can refer her to [facility] and weight loss. She can let me know in 4 weeks about her decision.  Refilled epi pen per patient request for shellfish allergy

## 2018-09-10 ENCOUNTER — Encounter: Payer: Self-pay | Admitting: Family Medicine

## 2018-09-14 ENCOUNTER — Telehealth: Payer: Self-pay | Admitting: Internal Medicine

## 2018-09-14 NOTE — Telephone Encounter (Signed)
Pt called back and states that her insurance will pay for saxenda but does require a prior auth through [employer]. 1 [phone]. Please send in saxenda med

## 2018-09-14 NOTE — Telephone Encounter (Signed)
Can you handle this PA for her please? For Saxenda. It is a tapering dose starting at 0.6 for the first week and then increasing by 0.6 per week until she gets to 3 mg. She will need to come in for a follow up in 2 weeks after starting the medication.

## 2018-09-20 ENCOUNTER — Other Ambulatory Visit: Payer: Self-pay | Admitting: Family Medicine

## 2018-09-20 ENCOUNTER — Telehealth: Payer: Self-pay | Admitting: Family Medicine

## 2018-09-20 MED ORDER — LIRAGLUTIDE -WEIGHT MANAGEMENT 18 MG/3ML ~~LOC~~ SOPN: 0.6000 mg | PEN_INJECTOR | Freq: Every day | SUBCUTANEOUS | 1 refills | Status: DC

## 2018-09-20 NOTE — Telephone Encounter (Signed)
P.A. SAXENDA  

## 2018-09-22 ENCOUNTER — Other Ambulatory Visit: Payer: BLUE CROSS/BLUE SHIELD

## 2018-09-22 NOTE — Telephone Encounter (Signed)
P.A. Approved, called pharmacy and went thru for $0 co pay.  Called pt and informed and advised to follow up in 2 weeks after starting medication.  Also she is coming by this afternoon for training.

## 2018-10-11 ENCOUNTER — Encounter: Payer: Self-pay | Admitting: Family Medicine

## 2018-10-11 ENCOUNTER — Ambulatory Visit (INDEPENDENT_AMBULATORY_CARE_PROVIDER_SITE_OTHER): Payer: BLUE CROSS/BLUE SHIELD | Admitting: Family Medicine

## 2018-10-11 NOTE — Progress Notes (Signed)
   Subjective:    Patient ID: Vanessa Copeland, female    DOB: Jun 18, 1966, 52 y.o.   MRN: [id number]  HPI Chief Complaint  Patient presents with  . follow-up    follow-up on Bp. running around 138/83   She is here to follow up on an elevated BP at her previous visit.   States BP was 117/78 at her orthopedist recently.   She also started on Saxenda 2 weeks ago for weight loss and is following up on this. Reports doing well, lost 4 lbs and no side effects.      Review of Systems Pertinent positives and negatives in the history of present illness.     Objective:   Physical Exam BP 138/80   Pulse 83   Wt 241 lb 9.6 oz (109.6 kg)   BMI 42.80 kg/m   Alert and oriented and in no distress. Not examined otherwise.        Assessment & Plan:  Morbid obesity ([facility])  BP is close to goal range.  Continue on Saxenda and follow up in 4 weeks.

## 2018-10-20 ENCOUNTER — Telehealth: Payer: Self-pay

## 2018-10-20 NOTE — Telephone Encounter (Signed)
Please call and find out if she is skipping meals or eating mostly carbohydrates without protein.  This is most often when we see dizziness with this medication.  Also, not drinking enough water can also cause this. Please get more information and let me know.  Thanks.

## 2018-10-20 NOTE — Telephone Encounter (Signed)
Patient called and stated she is taking 1.8 of Saxenda and has felt dizzy for 5 days. She was made aware that the dizziness is a side effect of the medication but she wants to know if she should continue it or not. Please advise.

## 2018-10-20 NOTE — Telephone Encounter (Signed)
Pt states she is skipping meals, not drinking enough water and eating without protein sometimes. Pt was advised to stop skipping meals, drink more water and do eat carbs a lot- try all this first and if that doesn't help with dizziness then call us back

## 2018-11-09 ENCOUNTER — Ambulatory Visit: Payer: BLUE CROSS/BLUE SHIELD | Admitting: Family Medicine

## 2019-01-03 ENCOUNTER — Encounter: Payer: Self-pay | Admitting: Family Medicine

## 2019-01-03 ENCOUNTER — Ambulatory Visit (INDEPENDENT_AMBULATORY_CARE_PROVIDER_SITE_OTHER): Payer: BLUE CROSS/BLUE SHIELD | Admitting: Family Medicine

## 2019-01-03 VITALS — BP 116/78 | HR 71 | Temp 97.7°F | Ht 63.0 in | Wt 246.0 lb

## 2019-01-03 DIAGNOSIS — R319 Hematuria, unspecified: Secondary | ICD-10-CM

## 2019-01-03 DIAGNOSIS — R109 Unspecified abdominal pain: Secondary | ICD-10-CM

## 2019-01-03 DIAGNOSIS — R350 Frequency of micturition: Secondary | ICD-10-CM | POA: Diagnosis not present

## 2019-01-03 DIAGNOSIS — Z87442 Personal history of urinary calculi: Secondary | ICD-10-CM

## 2019-01-03 LAB — POCT URINALYSIS DIP (PROADVANTAGE DEVICE)
BILIRUBIN UA: NEGATIVE mg/dL
Bilirubin, UA: NEGATIVE
GLUCOSE UA: NEGATIVE mg/dL
LEUKOCYTES UA: NEGATIVE
Nitrite, UA: NEGATIVE
PROTEIN UA: NEGATIVE mg/dL
SPECIFIC GRAVITY, URINE: 1.025
Urobilinogen, Ur: NEGATIVE
pH, UA: 6 (ref 5.0–8.0)

## 2019-01-03 MED ORDER — IBUPROFEN 800 MG PO TABS: ORAL_TABLET | ORAL | 0 refills | Status: DC

## 2019-01-03 NOTE — Patient Instructions (Addendum)
You have blood in your urine and this may be due to a kidney stone. If your pay worsens, I recommend contacting Dr. Alyson Ingles, urologist.   We will send your urine for culture and contact you regarding this result.

## 2019-01-03 NOTE — Progress Notes (Signed)
Chief Complaint  Patient presents with  . Urinary Tract Infection    lower back pain with nausea,frequent urinations   . Medication Refill    Iburpofen     Subjective:  Vanessa Copeland is a 53 y.o. female who complains of possible urinary tract infection.  She has had symptoms for 3 days.  Symptoms include left low back pain, nausea, urinary frequency x 2-3 weeks. Patient denies fever, chills, dizziness, chest pain, palpiations, abdominal pain, vomiting, diarrhea.  Last UTI was unknown.   Using tylenol for current symptoms.    Low back pain is constant, non radiating. States pain is manageable for now.   She does report a history of kidney stones requiring surgery in July 2019.  Negative lumbar CT in July 2019  Requests refill on ibuprofen 800 mg. Takes this prn for aches and pains but not regularly.   Patient does not have a history of recurrent UTI. Patient does not have a history of pyelonephritis.  No other aggravating or relieving factors.    Does not have periods, not since Mirena.   She started on Saxenda but stopped it after 6 weeks due to dizziness.    Past Medical History:  Diagnosis Date  . Allergy   . Anemia   . Asthma    mild intermittent(when allergies are flaring)  . Complication of anesthesia   . H/O echocardiogram 12/2005   Trace MR, mild TR, nl L ventricular systolic function.  [place] Kitchen Heart murmur    since birth. Never has had any problems  . History of kidney stones   . Kidney stones   . Obesity, unspecified   . PONV (postoperative nausea and vomiting)   . Vitamin D deficiency 01/2010    ROS as in subjective  Reviewed allergies, medications, past medical, surgical, and social history.    Objective: Vitals:   01/03/19 0954  BP: 116/78  Pulse: 71  Temp: 97.7 F (36.5 C)  SpO2: 99%    General appearance: alert, no distress, WD/WN, female Abdomen: +bs, soft, non tender, non distended, no masses, no hepatomegaly, no splenomegaly, no bruits Back: no CVA  tenderness GU: declines     Laboratory:  Urine dipstick: 2+ for hemoglobin.   Urine culture sent. Urine microscopy also sent.      Assessment: Hematuria, unspecified type - Plan: Urine Culture, Urine Microscopic  Frequent urination - Plan: POCT Urinalysis DIP (Proadvantage Device), Urine Culture  History of kidney stones  Left flank pain - Plan: Urine Culture    Plan: Discussed that her constant low back pain along with hematuria is suggestive for kidney stone.  She does have a history of renal calculi that required surgery.   Advised increased water intake, can use ibuprofen 800 mg for pain.    Advised that if her symptoms worsen, she will need to see her urologist. She may go ahead and call to schedule to see them.  She will let me know if she needs something stronger for pain control if her pain worsens.  Urine culture sent.

## 2019-01-04 LAB — URINALYSIS, MICROSCOPIC ONLY: CASTS: NONE SEEN /LPF

## 2019-01-04 LAB — URINE CULTURE

## 2019-01-04 IMAGING — MR MR LUMBAR SPINE W/O CM
4 of 5 series · 18 of 48 positions shown · non-contrast
Comparison: Lumbar CT 07/19/2018. CT Abdomen and Pelvis 05/21/2018.

CLINICAL DATA: 51-year-old female with 3 weeks of left low back
pain, leg pain, weakness and numbness after lifting injury.

EXAM:
MRI LUMBAR SPINE WITHOUT CONTRAST
TECHNIQUE: Multiplanar, multisequence MR imaging of the lumbar spine was
performed. No intravenous contrast was administered.

[Series 6: T2 · sagittal · 4.0mm · 0.73mm/px · 6 of 17 slices shown (1 of 2)]
[im 1/17]
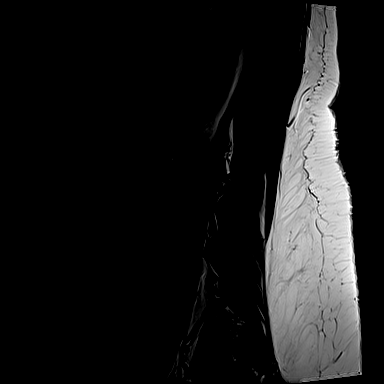
[im 4/17]
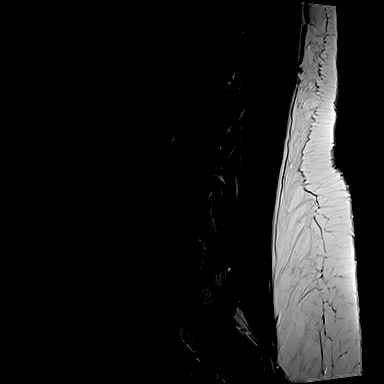
[im 7/17]
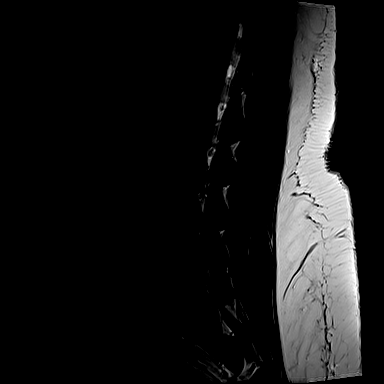
[im 10/17]
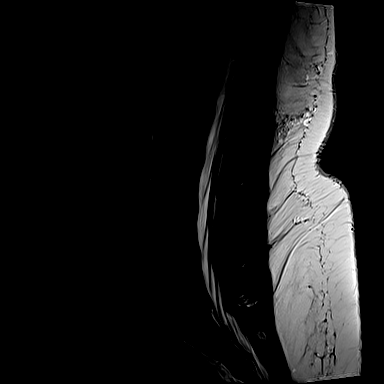
[im 13/17]
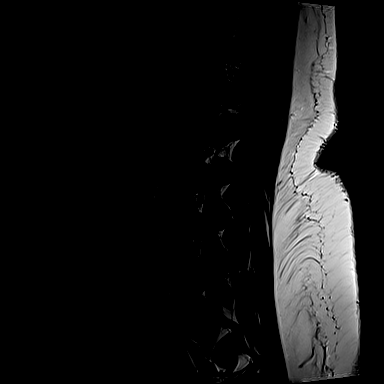
[im 17/17]
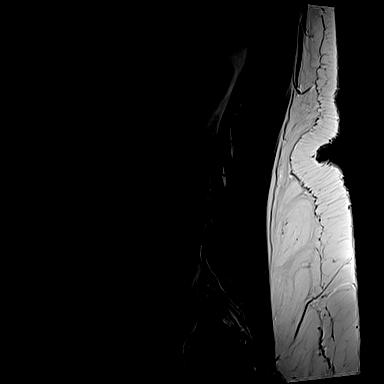

[Series 7: T1 · sagittal · 4.0mm · 0.73mm/px · 3 of 17 slices shown (1 of 2)]
[im 4/17]
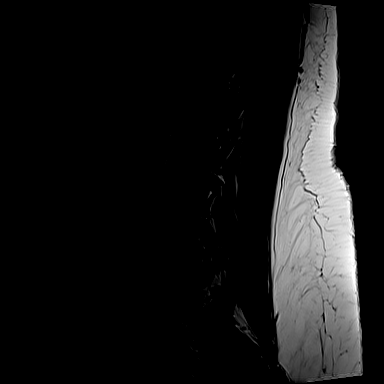
[im 10/17]
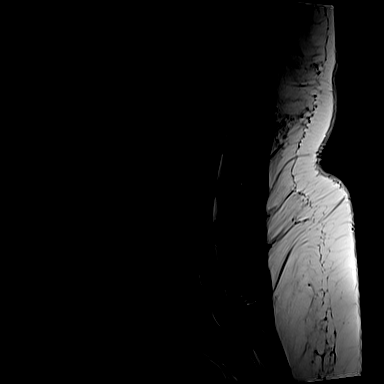
[im 17/17]
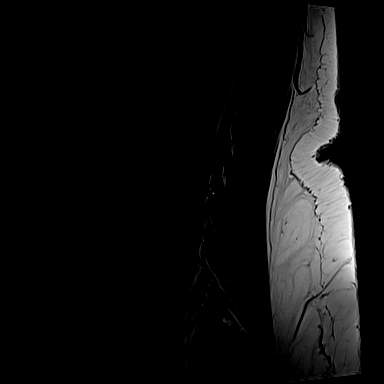

[Series 13: T2 · axial · 4.0mm · 0.28mm/px · z∈[-71,+121]mm · 6 of 42 slices shown (2 of 2)]
[im 1/42]
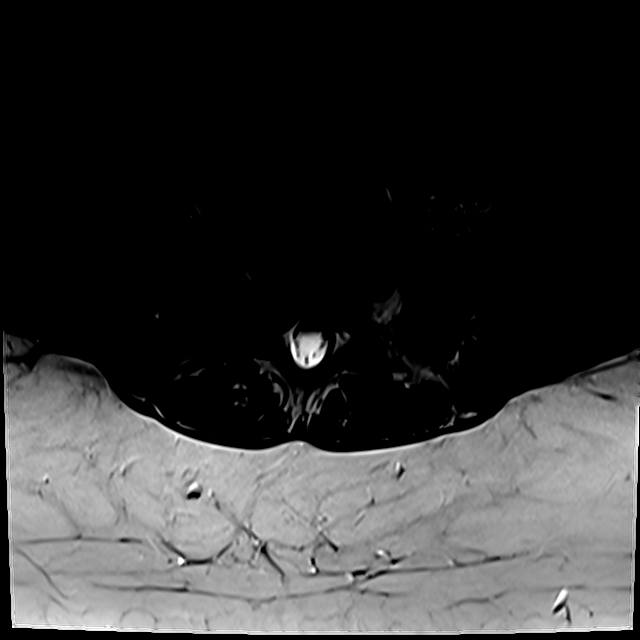
[im 6/42]
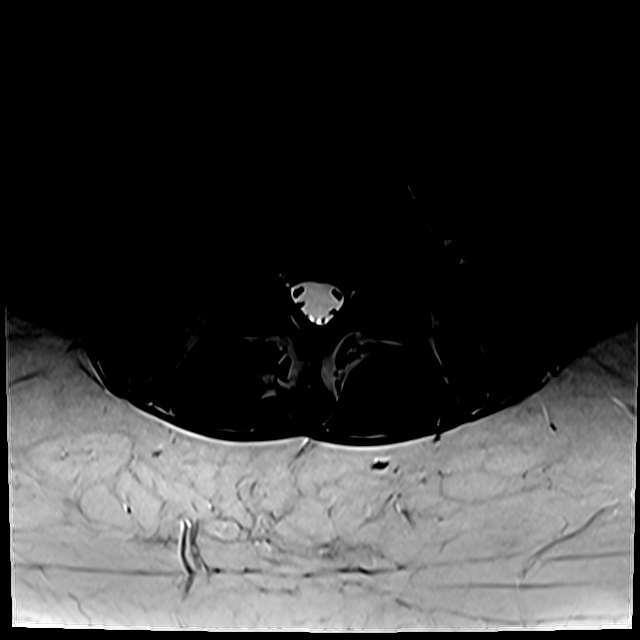
[im 12/42]
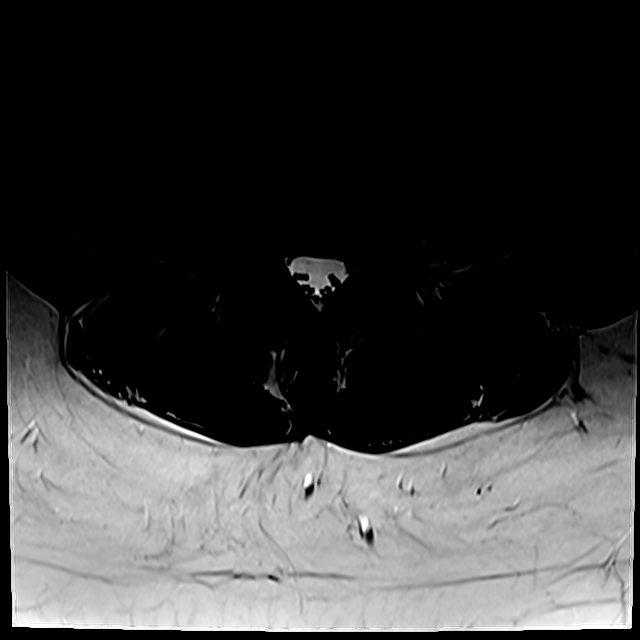
[im 18/42]
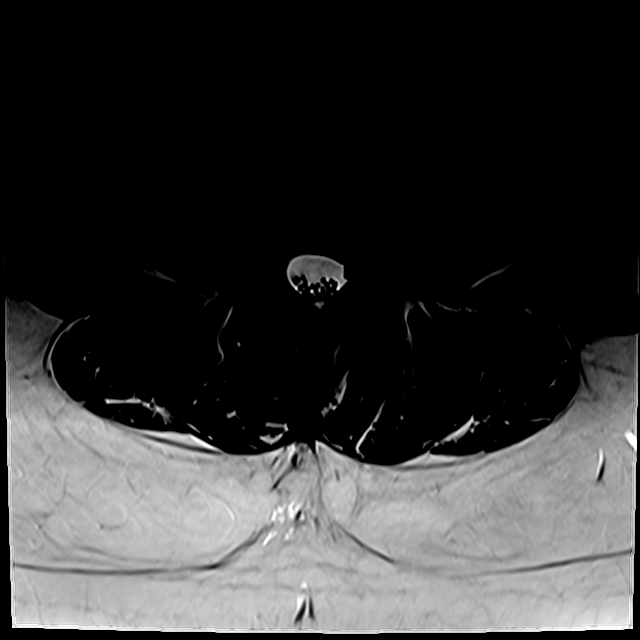
[im 21/42]
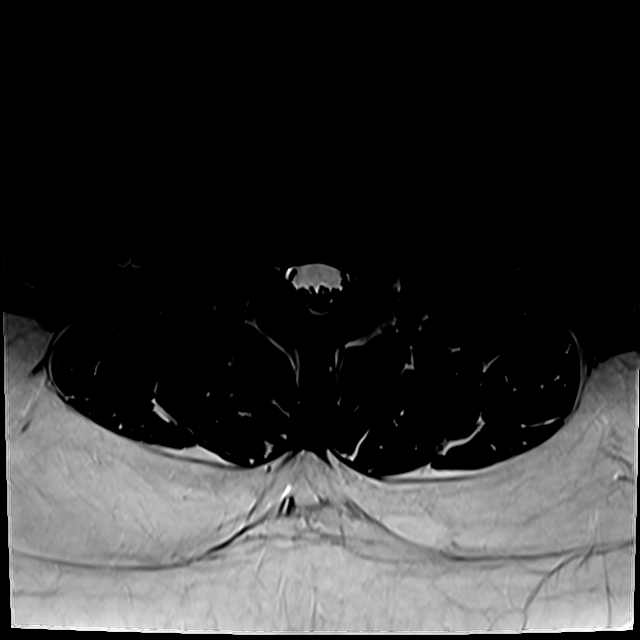
[im 36/42]
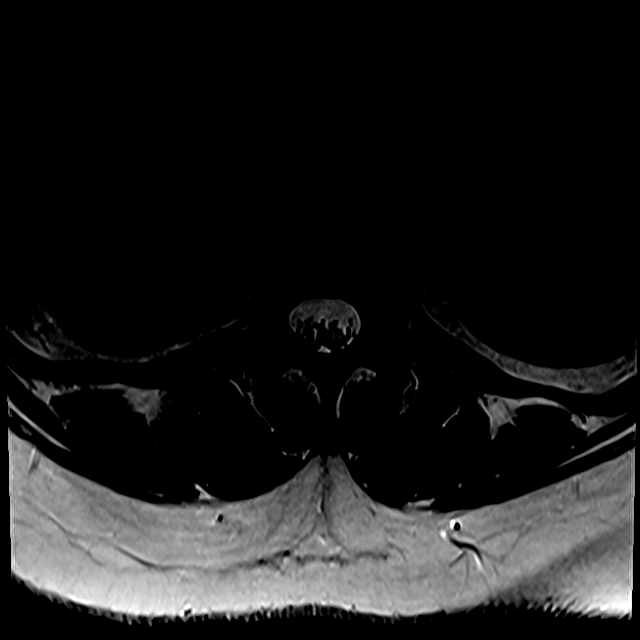

[Series 101: T1 · axial · 4.0mm · 0.28mm/px · z∈[-46,+121]mm · 3 of 42 slices shown (2 of 2)]
[im 6/42]
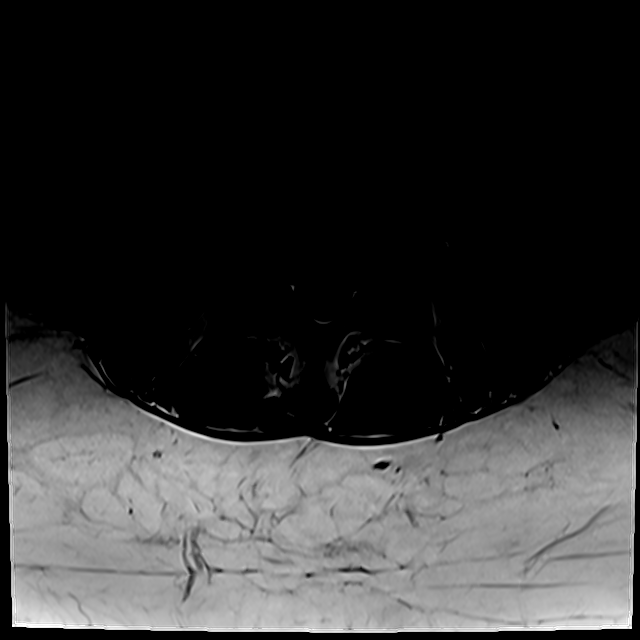
[im 21/42]
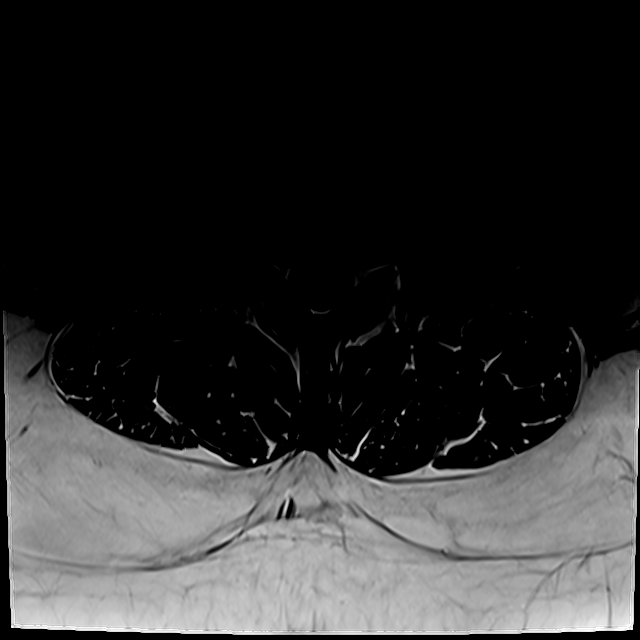
[im 36/42]
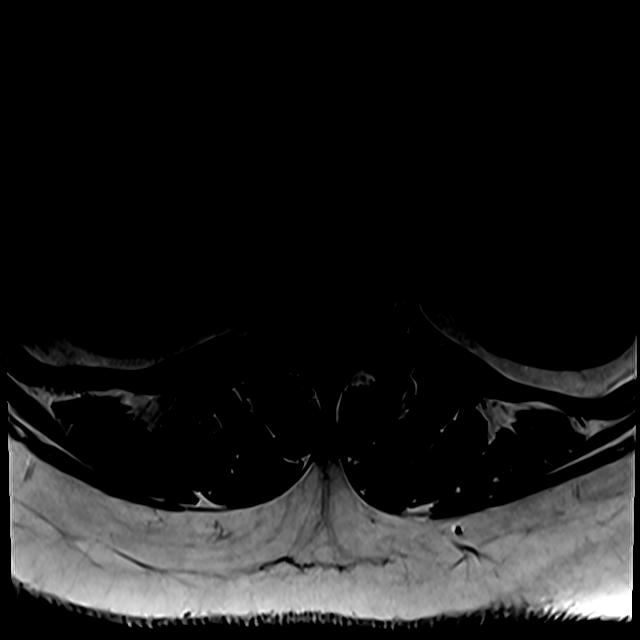

[18 of 48 positions shown; findings below may reference images not displayed]

FINDINGS: Segmentation:  Normal as demonstrated on the prior CTs.

Alignment: Stable lumbar lordosis with subtle retrolisthesis of L5
on S1.

Vertebrae: No marrow edema or evidence of acute osseous abnormality.
Visualized bone marrow signal is within normal limits. Intact
visible sacrum and SI joints.

Conus medullaris and cauda equina: Conus extends to the T12-L1
level. No lower spinal cord or conus signal abnormality.

Paraspinal and other soft tissues: The left renal collecting system
and proximal left ureter appear decompressed now since 07/19/2018.
Bilateral nephrolithiasis is not evident by MRI. Small benign
appearing left renal cysts.

Otherwise negative visible abdominal viscera. Negative visualized
posterior paraspinal soft tissues.

Disc levels:

T11-T12: Negative.

T12-L1:  Negative.

L1-L2:  Negative.

L2-L3:  Negative.

L3-L4:  Negative.

L4-L5: Broad-based left subarticular and left foraminal disc
extrusion best demonstrated on series 101, image 28 and the adjacent
images. The disc extrusion severely affects the left L4 neural
foramen, and results in mild left lateral recess stenosis at the
left L5 nerve level.

No significant spinal stenosis.  No right side neural impingement.

L5-S1: Mild disc desiccation. Small central to slightly left
paracentral disc protrusion with annular fissure (series 13, image
37). No associated stenosis.
IMPRESSION: 1. The symptomatic level appears to be L4-L5 where a leftward disc
extrusion severely affects the neural foramen. Query left L4
radiculitis. Mild associated left lateral recess stenosis at the
left L5 nerve level.
2. Mild disc degeneration at L5-S1 without stenosis. Normal lumbar
spine elsewhere.
3. Left hydronephrosis and hydroureter appear resolved since
07/19/2018.

## 2019-01-08 ENCOUNTER — Telehealth: Payer: Self-pay | Admitting: Family Medicine

## 2019-01-08 NOTE — Telephone Encounter (Signed)
P.A. SAXENDA RENEWAL  °

## 2019-01-24 NOTE — Telephone Encounter (Signed)
P.A. approved, called pharmacy went thru for $50.  Called pt and she states she is no longer able to take this medication due to dizziness

## 2019-02-07 ENCOUNTER — Other Ambulatory Visit: Payer: Self-pay | Admitting: Family Medicine

## 2019-02-07 NOTE — Telephone Encounter (Signed)
Is this okay to refill? 

## 2019-04-18 ENCOUNTER — Other Ambulatory Visit: Payer: Self-pay | Admitting: Family Medicine

## 2019-04-19 NOTE — Telephone Encounter (Signed)
Is this okay to refill? 

## 2019-04-29 ENCOUNTER — Ambulatory Visit (INDEPENDENT_AMBULATORY_CARE_PROVIDER_SITE_OTHER): Payer: BLUE CROSS/BLUE SHIELD | Admitting: Family Medicine

## 2019-04-29 ENCOUNTER — Other Ambulatory Visit: Payer: Self-pay

## 2019-04-29 ENCOUNTER — Encounter: Payer: Self-pay | Admitting: Family Medicine

## 2019-04-29 VITALS — Wt 243.0 lb

## 2019-04-29 DIAGNOSIS — J302 Other seasonal allergic rhinitis: Secondary | ICD-10-CM | POA: Insufficient documentation

## 2019-04-29 DIAGNOSIS — J4520 Mild intermittent asthma, uncomplicated: Secondary | ICD-10-CM

## 2019-04-29 DIAGNOSIS — J45909 Unspecified asthma, uncomplicated: Secondary | ICD-10-CM | POA: Insufficient documentation

## 2019-04-29 DIAGNOSIS — T7840XA Allergy, unspecified, initial encounter: Secondary | ICD-10-CM | POA: Insufficient documentation

## 2019-04-29 HISTORY — DX: Morbid (severe) obesity due to excess calories: E66.01

## 2019-04-29 MED ORDER — ALBUTEROL SULFATE HFA 108 (90 BASE) MCG/ACT IN AERS: 2.0000 | INHALATION_SPRAY | Freq: Four times a day (QID) | RESPIRATORY_TRACT | 0 refills | Status: DC | PRN

## 2019-04-29 NOTE — Progress Notes (Signed)
   Subjective:   Documentation for virtual audio and video telecommunications through [place] encounter:  The patient was located at home. 2 patient identifiers used.  The provider was located in the office. The patient did consent to this visit and is aware of possible charges through their insurance for this visit.  The other persons participating in this telemedicine service were none.     Patient ID: Vanessa Copeland, female    DOB: 10-31-1966, 53 y.o.   MRN: [id number]  HPI Chief Complaint  Patient presents with  . allergies and wheezing    allergies- couple weeks and wheezing-. taking zyrtec,    Complains of a 2 week history of ears and throat itching, sneezing.  Started on Zyrtec 2 weeks ago. Symptoms are improving.   Reports a history of asthma. Triggers are typically seasonal allergies and colds.  Has used albuterol inhaler in the past but does not have one now. Requests a refill.   Is not a smoker.   No sick contacts  Has an IUD   Requests help with weight loss. She wsa taking Saxenda but had dizziness. Has been struggling with being obese for years.   Denies fever, chills, dizziness, chest pain, palpitations, shortness of breath, abdominal pain, N/V/D, urinary symptoms, LE edema.   Reviewed allergies, medications, past medical, surgical, family, and social history.    Review of Systems Pertinent positives and negatives in the history of present illness.     Objective:   Physical Exam Wt 243 lb (110.2 kg)   BMI 43.05 kg/m   Alert and oriented and in no acute distress. Respirations unlabored. No coughing. Speaking in complete sentences without difficulty. Normal speech, mood.       Assessment & Plan:  Mild intermittent asthma without complication - Plan: albuterol (VENTOLIN HFA) 108 (90 Base) MCG/ACT inhaler  Seasonal allergies  Morbid obesity ([place]) - Plan: Amb Ref to [facility] Weight [facility]  Allergies improving with Zyrtec. No recent asthma  flares but she is out of albuterol. Refilled albuterol.  Side effects from [place]. Issues with weight for years.  Referral to [facility].  Follow up as needed.   Time spent on call was 14 minutes and in review of previous records 2 minutes total.  This virtual service is not related to other E/M service within previous 7 days.

## 2019-05-04 ENCOUNTER — Other Ambulatory Visit: Payer: Self-pay

## 2019-05-04 ENCOUNTER — Other Ambulatory Visit (HOSPITAL_COMMUNITY)
Admission: RE | Admit: 2019-05-04 | Discharge: 2019-05-04 | Disposition: A | Discharge status: Home / Self Care | Payer: BLUE CROSS/BLUE SHIELD | Source: Ambulatory Visit | Attending: Obstetrics and Gynecology | Admitting: Obstetrics and Gynecology

## 2019-05-04 ENCOUNTER — Encounter: Payer: Self-pay | Admitting: Obstetrics and Gynecology

## 2019-05-04 ENCOUNTER — Ambulatory Visit (INDEPENDENT_AMBULATORY_CARE_PROVIDER_SITE_OTHER): Payer: BLUE CROSS/BLUE SHIELD | Admitting: Obstetrics and Gynecology

## 2019-05-04 VITALS — BP 132/86 | HR 74 | Ht 63.0 in | Wt 257.0 lb

## 2019-05-04 DIAGNOSIS — R102 Pelvic and perineal pain: Secondary | ICD-10-CM

## 2019-05-04 DIAGNOSIS — N941 Unspecified dyspareunia: Secondary | ICD-10-CM | POA: Insufficient documentation

## 2019-05-04 DIAGNOSIS — N951 Menopausal and female climacteric states: Secondary | ICD-10-CM | POA: Diagnosis not present

## 2019-05-04 DIAGNOSIS — O285 Abnormal chromosomal and genetic finding on antenatal screening of mother: Secondary | ICD-10-CM

## 2019-05-04 DIAGNOSIS — Z131 Encounter for screening for diabetes mellitus: Secondary | ICD-10-CM

## 2019-05-04 DIAGNOSIS — Z6841 Body Mass Index (BMI) 40.0 and over, adult: Secondary | ICD-10-CM | POA: Diagnosis not present

## 2019-05-04 DIAGNOSIS — Z113 Encounter for screening for infections with a predominantly sexual mode of transmission: Secondary | ICD-10-CM

## 2019-05-04 DIAGNOSIS — R635 Abnormal weight gain: Secondary | ICD-10-CM

## 2019-05-04 DIAGNOSIS — Z13220 Encounter for screening for lipoid disorders: Secondary | ICD-10-CM

## 2019-05-04 DIAGNOSIS — Z124 Encounter for screening for malignant neoplasm of cervix: Secondary | ICD-10-CM | POA: Insufficient documentation

## 2019-05-04 DIAGNOSIS — Z30431 Encounter for routine checking of intrauterine contraceptive device: Secondary | ICD-10-CM

## 2019-05-04 MED ORDER — PHENTERMINE HCL 37.5 MG PO TABS: 37.5000 mg | ORAL_TABLET | Freq: Every day | ORAL | 0 refills | Status: DC

## 2019-05-04 NOTE — Progress Notes (Signed)
Gynecology Pelvic Pain Evaluation   Chief Complaint:  Chief Complaint  Patient presents with   Procedure    IUD removal    History of Present Illness:   Patient is a 53 y.o. G3P3 who LMP was No LMP recorded. (Menstrual status: Other)., presents today for a problem visit.  She complains of dyspareunia.    Her pain is localized to the deep pelvis, slightly to left of midline, described as sharp and stabbing, began several months ago and its severity is described as moderate. TShe has thad no associated symptoms. Patient has these modifiers which include intercourse that make it worse.  There has not been any associated vaginal bleeding.  She does report some mild vasomotor symptoms in the past year.  Of note her husband suffers from Peyronie's disease, which may also be a contributing factor to the dyspareunia. She does have a Mirena ID in place which was placed 10/15/2014 and is interested in having this removed secondary to concern that it may be cause of the dyspareunia.  The IUD was initially placed for heavy bleeding  Previous evaluation: none. Prior Diagnosis: none. Previous Treatment: none.   Patientis a 53 y.o. G3P3 female, who presents for the evaluation of weight gain. She has gained 15 pounds primarily over 3 years. The patient states the following issues have contributed to her weight problem: lifestyle.  The patient has no additional symptoms. The patient specifically denies memory loss, muscle weakness, excessive thirst, and polyuria. Weight related co-morbidities include none. The patient's past medical history is notable for none. She has tried phentermine interventions in the past with good success.   Review of Systems: Review of Systems  Constitutional: Negative.   Gastrointestinal: Negative.   Genitourinary: Negative.   Skin: Negative.     Past Medical History:  Past Medical History:  Diagnosis Date   Allergy    Anemia    Asthma    mild intermittent(when  allergies are flaring)   Complication of anesthesia    H/O echocardiogram 12/2005   Trace MR, mild TR, nl L ventricular systolic function.   Heart murmur    since birth. Never has had any problems   History of kidney stones    Kidney stones    Morbid obesity ([place]) 04/29/2019   Obesity, unspecified    PONV (postoperative nausea and vomiting)    Vitamin D deficiency 01/2010    Past Surgical History:  Past Surgical History:  Procedure Laterality Date   CESAREAN SECTION     x3   CYSTOSCOPY WITH RETROGRADE PYELOGRAM, URETEROSCOPY AND STENT PLACEMENT Left 07/16/2018   Procedure: CYSTOSCOPY WITH RETROGRADE PYELOGRAM, URETEROSCOPY AND STENT PLACEMENT;  Surgeon: Cleon Gustin, MD;  Location: [facility];  Service: Urology;  Laterality: Left;   EXTRACORPOREAL SHOCK WAVE LITHOTRIPSY Left 06/21/2018   Procedure: LEFT EXTRACORPOREAL SHOCK WAVE LITHOTRIPSY (ESWL);  Surgeon: Franchot Gallo, MD;  Location: [facility];  Service: Urology;  Laterality: Left;   HOLMIUM LASER APPLICATION Left 04/22/7740   Procedure: HOLMIUM LASER APPLICATION;  Surgeon: Cleon Gustin, MD;  Location: [facility];  Service: Urology;  Laterality: Left;   OOPHORECTOMY  2002   left due to cyst   TUBAL LIGATION  1996    Gynecologic History:  No LMP recorded. (Menstrual status: Other). She is single partner, contraception - IUD.  Last Pap: 07/2014. Results were: NIL HPV negative   Obstetric History: G3P3  Family History:  Family History  Problem Relation Age of Onset   Hypertension Mother  Anemia Mother    Colon polyps Mother    Cancer Father        lung   Kidney Stones Father    Hypertension Father    Lung cancer Father 107   Asthma Brother    Asthma Daughter    Allergies Daughter    Asthma Daughter    Hypertension Brother    Breast cancer Neg Hx    Colon cancer Neg Hx    Diabetes Neg Hx    Heart disease Neg Hx     Social History:  Social History   Socioeconomic History    Marital status: Married    Spouse name: Not on file   Number of children: 3   Years of education: Not on file   Highest education level: Not on file  Occupational History   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[employer]: [place] resource strain: Not on file   Food insecurity:    Worry: Not on file    Inability: Not on file   Transportation needs:    Medical: Not on file    Non-medical: Not on file  Tobacco Use   Smoking status: Never Smoker   Smokeless tobacco: Never Used  Substance and Sexual Activity   Alcohol use: No   Drug use: No   Sexual activity: Yes    Partners: Male    Birth control/protection: Surgical  Lifestyle   Physical activity:    Days per week: Not on file    Minutes per session: Not on file   Stress: Not on file  Relationships   Social connections:    Talks on phone: Not on file    Gets together: Not on file    Attends religious service: Not on file    Active member of club or organization: Not on file    Attends meetings of clubs or organizations: Not on file    Relationship status: Not on file   Intimate partner violence:    Fear of current or ex partner: Not on file    Emotionally abused: Not on file    Physically abused: Not on file    Forced sexual activity: Not on file  Other Topics Concern   Not on file  Social History Narrative   Lives with husband, 2/3 children.  (2 daughters).  Son lives in [place].  1 dog    Allergies:  Allergies  Allergen Reactions   Shellfish Allergy Shortness Of Breath    Medications: Prior to Admission medications   Medication Sig Start Date End Date Taking? Authorizing Provider  albuterol (VENTOLIN HFA) 108 (90 Base) MCG/ACT inhaler Inhale 2 puffs into the lungs every 6 (six) hours as needed for wheezing or shortness of breath. 04/29/19  Yes Henson, Vickie L, NP-C  ibuprofen (ADVIL) 800 MG tablet TAKE 1 TABLET(800 MG) BY MOUTH THREE TIMES DAILY  04/19/19  Yes Henson, Vickie L, NP-C  phentermine (ADIPEX-P) 37.5 MG tablet Take 1 tablet (37.5 mg total) by mouth daily before breakfast. 05/04/19   Malachy Mood, MD    Physical Exam Vitals: Blood pressure 132/86, pulse 74, height 5\' 3"  (1.6 m), weight 257 lb (116.6 kg).  General: NAD, well nourished, appears stated age 77: normocephalic, anicteric Pulmonary: No increased work of breathing Abdomen: Soft, non-tender, non-distended.  Umbilicus without lesions.  No hepatomegaly, splenomegaly or masses palpable. No evidence of hernia  Genitourinary:  External: Normal external female genitalia.  Normal urethral meatus, normal  Bartholin's and Skene's glands.    Vagina: Normal vaginal mucosa, no evidence of prolapse.    Cervix: Grossly normal in appearance, no bleeding  Uterus: Non-enlarged, mobile, normal contour.  No CMT  Adnexa: ovaries non-enlarged, no adnexal masses  Rectal: deferred  Lymphatic: no evidence of inguinal lymphadenopathy Extremities: no edema, erythema, or tenderness Neurologic: Grossly intact Psychiatric: mood appropriate, affect full  Female chaperone present for pelvic portion of the physical exam  Assessment: 53 y.o. G3P3 with dyspareunia, possible menopause considering IUD removal, and interested in medical weight loss management  Problem List Items Addressed This Visit    None    Visit Diagnoses    Pelvic pain    -  Primary   Relevant Orders   Cytology - PAP   US Transvaginal Non-OB   Dyspareunia, female       Relevant Orders   Cytology - PAP   US Transvaginal Non-OB   IUD check up       Relevant Orders   US Transvaginal Non-OB   Screening for malignant neoplasm of cervix       Relevant Orders   Cytology - PAP   Routine screening for STI (sexually transmitted infection)       Relevant Orders   Cytology - PAP   Menopausal vasomotor syndrome       Relevant Orders   FSH (Completed)   Estradiol (Completed)   CMP14+LP+TP+TSH+CBC/Plt (Completed)     Weight gain       Relevant Orders   FSH (Completed)   Estradiol (Completed)   CMP14+LP+TP+TSH+CBC/Plt (Completed)   Hemoglobin A1c (Completed)   Class 3 severe obesity without serious comorbidity with body mass index (BMI) of 40.0 to 44.9 in adult, unspecified obesity type (HCC)       Relevant Medications   phentermine (ADIPEX-P) 37.5 MG tablet   Other Relevant Orders   CMP14+LP+TP+TSH+CBC/Plt (Completed)   Hemoglobin A1c (Completed)   Diabetes mellitus screening       Relevant Orders   CMP14+LP+TP+TSH+CBC/Plt (Completed)   Hemoglobin A1c (Completed)   Lipid screening       Relevant Orders   CMP14+LP+TP+TSH+CBC/Plt (Completed)   Abnormal screening blood test for Down syndrome in first trimester            Pelvic pain  1) We discussed the possible etiologies for pelvic pain in women.  Gynecologic causes may include endometriosis, adenomyosis, pelvic inflammatory disease (PID), ovarian cysts, ovarian or tubal torsion, and in rare case gynecologic malignancy such as cervical, uterine, or ovarian cancer.  In addition thee possibility of non-gynecologic etiologies such as urinary or GI tract pathology or disordered, as well as musculoskeletal problems.  The goal is to complete a basic work up in hopes of identifying the underlying cause which in turn will dictate treatment.  In the meantime supportive measures such as localized heat, and NSAIDs are reasonable first steps.     - Prescription drug database was not reviewed, UDS was not ordered - Transvaginal ultrasound ordered - Blood work obtained today Yes  - Cervical cultures Yes with pap - UA not ordered  2) IUD - discussed good data for using Mirena up to 7 years.  I discussed that while the average age of menopause is around 59 in the [place], there is variation.  We will obtain FSH/estradiol to determine whether she can expect cessation of bleeding after IUD removal or whether she should consider continuation.  Will also check  position of IUD on ultrasound as if malpositioned may  in fact be contributing to dyspareunia.     Return in about 1 week (around 05/11/2019) for GYN and TVUS.   Obesity  1) 1500 Calorie ADA Diet  2) Patient education given regarding appropriate lifestyle changes for weight loss including: regular physical activity, healthy coping strategies, caloric restriction and healthy eating patterns.  3) Patient will be started on weight loss medication. The risks and benefits and side effects of medication, such as Adipex (Phenteramine) ,  Tenuate (Diethylproprion), Belviq (lorcarsin), Contrave (buproprion/naltrexone), Qsymia (phentermine/topiramate), and Saxenda (liraglutide) is discussed. The pros and cons of suppressing appetite and boosting metabolism is discussed. Risks of tolerence and addiction is discussed for selected agents discussed. Use of medicine will ne short term, such as 3-4 months at a time followed by a period of time off of the medicine to avoid these risks and side effects for Adipex, Qsymia, and Tenuate discussed. Pt to call with any negative side effects and agrees to keep follow up appts.  4) Comorbidity Screening - hypothyroidism screening, diabetes, and hyperlipidemia screening offered  5) Encouraged weekly weight monitorig to track progress and sample 1 week food diary  6) Contraception - discussed that all weight loss drugs fall in to pregnancy category X, patient currently has reliable contraception in the form of Mirena IUD  7) 15 minutes face-to-face; counseling/coordination of care > 50 percent of visit  8) Follow up in 4 weeks to assess response     Malachy Mood, MD, [place], [place] 05/05/2019, 8:15 AM

## 2019-05-05 LAB — CMP14+LP+TP+TSH+CBC/PLT
ALT: 16 IU/L (ref 0–32)
AST: 15 IU/L (ref 0–40)
Albumin/Globulin Ratio: 1.3 (ref 1.2–2.2)
Albumin: 4.3 g/dL (ref 3.8–4.9)
Alkaline Phosphatase: 94 IU/L (ref 39–117)
BUN/Creatinine Ratio: 15 (ref 9–23)
BUN: 11 mg/dL (ref 6–24)
Bilirubin Total: 0.5 mg/dL (ref 0.0–1.2)
CO2: 24 mmol/L (ref 20–29)
Calcium: 9.1 mg/dL (ref 8.7–10.2)
Chloride: 104 mmol/L (ref 96–106)
Cholesterol, Total: 194 mg/dL (ref 100–199)
Creatinine, Ser: 0.71 mg/dL (ref 0.57–1.00)
Free Thyroxine Index: 1.7 (ref 1.2–4.9)
GFR calc Af Amer: 114 mL/min/{1.73_m2} (ref >59)
GFR calc non Af Amer: 99 mL/min/{1.73_m2} (ref >59)
Globulin, Total: 3.2 g/dL (ref 1.5–4.5)
Glucose: 86 mg/dL (ref 65–99)
HDL: 45 mg/dL (ref >39)
Hematocrit: 35.2 % (ref 34.0–46.6)
Hemoglobin: 11.1 g/dL (ref 11.1–15.9)
LDL Calculated: 136 mg/dL — ABNORMAL HIGH (ref 0–99)
LDl/HDL Ratio: 3 ratio (ref 0.0–3.2)
MCH: 28 pg (ref 26.6–33.0)
MCHC: 31.5 g/dL (ref 31.5–35.7)
MCV: 89 fL (ref 79–97)
Platelets: 140 10*3/uL — ABNORMAL LOW (ref 150–450)
Potassium: 4.2 mmol/L (ref 3.5–5.2)
RBC: 3.96 x10E6/uL (ref 3.77–5.28)
RDW: 14.6 % (ref 11.7–15.4)
Sodium: 141 mmol/L (ref 134–144)
T3 Uptake Ratio: 27 % (ref 24–39)
T4, Total: 6.2 ug/dL (ref 4.5–12.0)
TSH: 3.41 u[IU]/mL (ref 0.450–4.500)
Total Protein: 7.5 g/dL (ref 6.0–8.5)
Triglycerides: 63 mg/dL (ref 0–149)
VLDL Cholesterol Cal: 13 mg/dL (ref 5–40)
WBC: 7.7 10*3/uL (ref 3.4–10.8)

## 2019-05-05 LAB — HEMOGLOBIN A1C
Est. average glucose Bld gHb Est-mCnc: 117 mg/dL
Hgb A1c MFr Bld: 5.7 % — ABNORMAL HIGH (ref 4.8–5.6)

## 2019-05-05 LAB — ESTRADIOL: Estradiol: 47.5 pg/mL

## 2019-05-05 LAB — FOLLICLE STIMULATING HORMONE: FSH: 16.2 m[IU]/mL

## 2019-05-06 LAB — CYTOLOGY - PAP
Adequacy: ABSENT
Chlamydia: NEGATIVE
Diagnosis: NEGATIVE
HPV: NOT DETECTED
Neisseria Gonorrhea: NEGATIVE

## 2019-05-09 DIAGNOSIS — L821 Other seborrheic keratosis: Secondary | ICD-10-CM | POA: Diagnosis not present

## 2019-05-12 ENCOUNTER — Other Ambulatory Visit: Payer: BLUE CROSS/BLUE SHIELD

## 2019-05-12 ENCOUNTER — Ambulatory Visit: Payer: BLUE CROSS/BLUE SHIELD | Admitting: Obstetrics and Gynecology

## 2019-06-30 ENCOUNTER — Other Ambulatory Visit: Payer: Self-pay

## 2019-06-30 ENCOUNTER — Encounter (HOSPITAL_COMMUNITY): Payer: Self-pay | Admitting: Obstetrics and Gynecology

## 2019-06-30 ENCOUNTER — Emergency Department (HOSPITAL_COMMUNITY)
Admission: EM | Admit: 2019-06-30 | Discharge: 2019-06-30 | Disposition: A | Discharge status: Home / Self Care | Payer: BC Managed Care – PPO | Attending: Emergency Medicine | Admitting: Emergency Medicine

## 2019-06-30 DIAGNOSIS — R52 Pain, unspecified: Secondary | ICD-10-CM | POA: Diagnosis not present

## 2019-06-30 DIAGNOSIS — F419 Anxiety disorder, unspecified: Secondary | ICD-10-CM | POA: Diagnosis not present

## 2019-06-30 DIAGNOSIS — R079 Chest pain, unspecified: Secondary | ICD-10-CM | POA: Diagnosis not present

## 2019-06-30 DIAGNOSIS — I10 Essential (primary) hypertension: Secondary | ICD-10-CM | POA: Diagnosis not present

## 2019-06-30 DIAGNOSIS — Z79899 Other long term (current) drug therapy: Secondary | ICD-10-CM | POA: Insufficient documentation

## 2019-06-30 DIAGNOSIS — J45909 Unspecified asthma, uncomplicated: Secondary | ICD-10-CM | POA: Insufficient documentation

## 2019-06-30 DIAGNOSIS — R0789 Other chest pain: Secondary | ICD-10-CM | POA: Diagnosis not present

## 2019-06-30 DIAGNOSIS — R457 State of emotional shock and stress, unspecified: Secondary | ICD-10-CM | POA: Diagnosis not present

## 2019-06-30 LAB — CBC WITH DIFFERENTIAL/PLATELET
Abs Immature Granulocytes: 0.04 10*3/uL (ref 0.00–0.07)
Basophils Absolute: 0 10*3/uL (ref 0.0–0.1)
Basophils Relative: 0 %
Eosinophils Absolute: 0 10*3/uL (ref 0.0–0.5)
Eosinophils Relative: 0 %
HCT: 36.9 % (ref 36.0–46.0)
Hemoglobin: 11.7 g/dL — ABNORMAL LOW (ref 12.0–15.0)
Immature Granulocytes: 1 %
Lymphocytes Relative: 23 %
Lymphs Abs: 1.9 10*3/uL (ref 0.7–4.0)
MCH: 28.3 pg (ref 26.0–34.0)
MCHC: 31.7 g/dL (ref 30.0–36.0)
MCV: 89.3 fL (ref 80.0–100.0)
Monocytes Absolute: 0.4 10*3/uL (ref 0.1–1.0)
Monocytes Relative: 5 %
Neutro Abs: 5.8 10*3/uL (ref 1.7–7.7)
Neutrophils Relative %: 71 %
Platelets: 406 10*3/uL — ABNORMAL HIGH (ref 150–400)
RBC: 4.13 MIL/uL (ref 3.87–5.11)
RDW: 15.6 % — ABNORMAL HIGH (ref 11.5–15.5)
WBC: 8.2 10*3/uL (ref 4.0–10.5)
nRBC: 0 % (ref 0.0–0.2)

## 2019-06-30 LAB — COMPREHENSIVE METABOLIC PANEL
ALT: 22 U/L (ref 0–44)
AST: 20 U/L (ref 15–41)
Albumin: 4.1 g/dL (ref 3.5–5.0)
Alkaline Phosphatase: 86 U/L (ref 38–126)
Anion gap: 13 (ref 5–15)
BUN: 12 mg/dL (ref 6–20)
CO2: 23 mmol/L (ref 22–32)
Calcium: 9 mg/dL (ref 8.9–10.3)
Chloride: 104 mmol/L (ref 98–111)
Creatinine, Ser: 0.73 mg/dL (ref 0.44–1.00)
GFR calc Af Amer: >60 mL/min (ref >60)
GFR calc non Af Amer: >60 mL/min (ref >60)
Glucose, Bld: 105 mg/dL — ABNORMAL HIGH (ref 70–99)
Potassium: 3.5 mmol/L (ref 3.5–5.1)
Sodium: 140 mmol/L (ref 135–145)
Total Bilirubin: 0.4 mg/dL (ref 0.3–1.2)
Total Protein: 8.2 g/dL — ABNORMAL HIGH (ref 6.5–8.1)

## 2019-06-30 LAB — TROPONIN I (HIGH SENSITIVITY): Troponin I (High Sensitivity): 4 ng/L (ref <18)

## 2019-06-30 MED ORDER — LORAZEPAM 1 MG PO TABS
1.0000 mg | ORAL_TABLET | Freq: Once | ORAL | Status: AC
  Administered 2019-06-30: 1 mg via ORAL
  Filled 2019-06-30: qty 1

## 2019-06-30 NOTE — ED Triage Notes (Signed)
Per EMS: Pt is coming from the school system following her drinking an energy drink. Pt reportedly started feeling jittery and anxious.  Pt is alert and oriented at this time.

## 2019-06-30 NOTE — ED Provider Notes (Addendum)
[facility] DEPT Provider Note   CSN: [id number] Arrival date & time: 06/30/19  1408    History   Chief Complaint Chief Complaint  Patient presents with  . Anxiety    After drinking an energy drink    HPI Vanessa Copeland is a 53 y.o. female.     Several episodes of chest tightness, anxiety, the last several days.  No known history of diabetes, hypertension, cigarette smoking, hyperlipidemia, family history, CAD.  She has never had these sensations before.  She did drink one half of an energy drink this morning.  No prolonged travel or immobilization.     Past Medical History:  Diagnosis Date  . Allergy   . Anemia   . Asthma    mild intermittent(when allergies are flaring)  . Complication of anesthesia   . H/O echocardiogram 12/2005   Trace MR, mild TR, nl L ventricular systolic function.  Marland Kitchen Heart murmur    since birth. Never has had any problems  . History of kidney stones   . Kidney stones   . Morbid obesity ([place]) 04/29/2019  . Obesity, unspecified   . PONV (postoperative nausea and vomiting)   . Vitamin D deficiency 01/2010    Patient Active Problem List   Diagnosis Date Noted  . Morbid obesity ([place]) 04/29/2019  . Seasonal allergies 04/29/2019  . Asthma   . Allergy   . History of kidney stones   . Vitamin D deficiency 10/21/2016  . Anemia 10/21/2016  . Screening for STD (sexually transmitted disease) 10/21/2016  . Allergic rhinitis 10/22/2012    Past Surgical History:  Procedure Laterality Date  . CESAREAN SECTION     x3  . CYSTOSCOPY WITH RETROGRADE PYELOGRAM, URETEROSCOPY AND STENT PLACEMENT Left 07/16/2018   Procedure: CYSTOSCOPY WITH RETROGRADE PYELOGRAM, URETEROSCOPY AND STENT PLACEMENT;  Surgeon: Cleon Gustin, MD;  [facility];  Service: Urology;  Laterality: Left;  . EXTRACORPOREAL SHOCK WAVE LITHOTRIPSY Left 06/21/2018   Procedure: LEFT EXTRACORPOREAL SHOCK WAVE LITHOTRIPSY (ESWL);  Surgeon: Franchot Gallo, MD;  [facility];  Service: Urology;  Laterality: Left;  . HOLMIUM LASER APPLICATION Left 6/65/9935   Procedure: HOLMIUM LASER APPLICATION;  Surgeon: Cleon Gustin, MD;  [facility];  Service: Urology;  Laterality: Left;  . OOPHORECTOMY  2002   left due to cyst  . TUBAL LIGATION  1996     OB History    Gravida  3   Para  3   Term      Preterm      AB      Living  3     SAB      TAB      Ectopic      Multiple      Live Births               Home Medications    Prior to Admission medications   Medication Sig Start Date End Date Taking? Authorizing Provider  albuterol (VENTOLIN HFA) 108 (90 Base) MCG/ACT inhaler Inhale 2 puffs into the lungs every 6 (six) hours as needed for wheezing or shortness of breath. 04/29/19   Henson, Vickie L, NP-C  ibuprofen (ADVIL) 800 MG tablet TAKE 1 TABLET(800 MG) BY MOUTH THREE TIMES DAILY 04/19/19   Raenette Rover, Vickie L, NP-C  phentermine (ADIPEX-P) 37.5 MG tablet Take 1 tablet (37.5 mg total) by mouth daily before breakfast. 05/04/19   Malachy Mood, MD    Family History Family History  Problem Relation Age of Onset  . Hypertension Mother   . Anemia Mother   . Colon polyps Mother   . Cancer Father        lung  . Kidney Stones Father   . Hypertension Father   . Lung cancer Father 32  . Asthma Brother   . Asthma Daughter   . Allergies Daughter   . Asthma Daughter   . Hypertension Brother   . Breast cancer Neg Hx   . Colon cancer Neg Hx   . Diabetes Neg Hx   . Heart disease Neg Hx     Social History Social History   Tobacco Use  . Smoking status: Never Smoker  . Smokeless tobacco: Never Used  Substance Use Topics  . Alcohol use: No  . Drug use: No     Allergies   Shellfish allergy   Review of Systems Review of Systems  All other systems reviewed and are negative.    Physical Exam Updated Vital Signs BP (!) 159/97 (BP Location: Right Arm)   Pulse 85   Temp 97.7 F (36.5 C)  (Oral)   Resp 16   SpO2 99%   Physical Exam Vitals signs and nursing note reviewed.  Constitutional:      Appearance: She is well-developed.     Comments: Overweight, no acute distress.  HENT:     Head: Normocephalic and atraumatic.  Eyes:     Conjunctiva/sclera: Conjunctivae normal.  Neck:     Musculoskeletal: Neck supple.  Cardiovascular:     Rate and Rhythm: Normal rate and regular rhythm.  Pulmonary:     Effort: Pulmonary effort is normal.     Breath sounds: Normal breath sounds.  Abdominal:     General: Bowel sounds are normal.     Palpations: Abdomen is soft.  Musculoskeletal: Normal range of motion.  Skin:    General: Skin is warm and dry.  Neurological:     Mental Status: She is alert and oriented to person, place, and time.  Psychiatric:        Behavior: Behavior normal.      ED Treatments / Results  Labs (all labs ordered are listed, but only abnormal results are displayed) Labs Reviewed  CBC WITH DIFFERENTIAL/PLATELET - Abnormal; Notable for the following components:      Result Value   Hemoglobin 11.7 (*)    RDW 15.6 (*)    Platelets 406 (*)    All other components within normal limits  COMPREHENSIVE METABOLIC PANEL - Abnormal; Notable for the following components:   Glucose, Bld 105 (*)    Total Protein 8.2 (*)    All other components within normal limits  TROPONIN I (HIGH SENSITIVITY)    EKG None  Radiology No results found.  Procedures Procedures (including critical care time)  Medications Ordered in ED Medications  LORazepam (ATIVAN) tablet 1 mg (1 mg Oral Given 06/30/19 1622)     Initial Impression / Assessment and Plan / ED Course  I have reviewed the triage vital signs and the nursing notes.  Pertinent labs & imaging results that were available during my care of the patient were reviewed by me and considered in my medical decision making (see chart for details).        History and physical most consistent with an anxiety  attack.  No obvious cardiovascular risk factors.  1 mg p.o.  Will also do a basic cardiac work-up.  History and physical more consistent with an anxiety attack.  Patient is low risk for ACS or PE.  Screening labs, troponin, EKG all normal.  She will follow-up with primary care.  Final Clinical Impressions(s) / ED Diagnoses   Final diagnoses:  Chest pain, unspecified type  Anxiety    ED Discharge Orders    None       Nat Christen, MD 06/30/19 1610    Nat Christen, MD 06/30/19 [phone]

## 2019-06-30 NOTE — Discharge Instructions (Addendum)
Test showed no life-threatening condition.  Follow-up with your primary care doctor.

## 2019-07-11 ENCOUNTER — Other Ambulatory Visit: Payer: Self-pay | Admitting: Family Medicine

## 2019-07-20 ENCOUNTER — Encounter: Payer: Self-pay | Admitting: Medical

## 2019-07-20 ENCOUNTER — Other Ambulatory Visit: Payer: Self-pay

## 2019-07-20 ENCOUNTER — Ambulatory Visit (INDEPENDENT_AMBULATORY_CARE_PROVIDER_SITE_OTHER): Payer: BC Managed Care – PPO | Admitting: Medical

## 2019-07-20 VITALS — Temp 97.3°F | Ht 63.0 in | Wt 230.0 lb

## 2019-07-20 DIAGNOSIS — J392 Other diseases of pharynx: Secondary | ICD-10-CM | POA: Diagnosis not present

## 2019-07-20 DIAGNOSIS — L299 Pruritus, unspecified: Secondary | ICD-10-CM | POA: Insufficient documentation

## 2019-07-20 DIAGNOSIS — J309 Allergic rhinitis, unspecified: Secondary | ICD-10-CM

## 2019-07-20 MED ORDER — LEVOCETIRIZINE DIHYDROCHLORIDE 5 MG PO TABS: 5.0000 mg | ORAL_TABLET | Freq: Every evening | ORAL | 2 refills | Status: DC

## 2019-07-20 NOTE — Progress Notes (Signed)
This visit type was conducted due to national recommendations for restrictions regarding the COVID-19 Pandemic (e.g. social distancing) in an effort to limit this patient's exposure and mitigate transmission in our community.  This format is felt to be most appropriate for this patient at this time.    Documentation for virtual audio and video telecommunications through Zoom encounter:  The patient was located at home. The provider was located in the office. The patient did consent to this visit and is aware of possible charges through their insurance for this visit.  The other persons participating in this telemedicine service were none. Time spent on call was 15 minutes and in review of previous records >15 minutes total.  This virtual service is not related to other E/M service within previous 7 days.   Subjective:  Vanessa Copeland is a 53 y.o. female who presents for Chief Complaint  Patient presents with  . allergies    allergies not controlled, itchy throat X 1 month ears      Virtual consult today for allergies.  She reports for the past month having itchy throat, itchy ears.  Has hx/o allergies . Uses allegra, claritin, changes it up from time to time as she feels like she builds up a tolerance to allergy medication.  Sometimes uses benadryl.  Itching in the throat wakes her up at night.  Currently has been using allegra and Claritin for the past 1 mo.  Uses Ibuprofen prn, albuterol rarely.   Denies any specific allergen trigger.   No swelling, no SOB, no wheezing.   No cough.  Sometimes has post nasal drip.   Does have some sneezing.  No rash, no skin irritation.   +occasional itchy eyes.     No animals in house.   She doesn't mow the yard.     She has had allergy testing in remote past, + for shellfish, tuna, cat and dog dander allergy.  No other aggravating or relieving factors. No other complaint.   The following portions of the patient's history were reviewed and updated as  appropriate: allergies, current medications, past family history, past medical history, past social history, past surgical history and problem list.  ROS Otherwise as in subjective above  Past Medical History:  Diagnosis Date  . Allergy   . Anemia   . Asthma    mild intermittent(when allergies are flaring)  . Complication of anesthesia   . H/O echocardiogram 12/2005   Trace MR, mild TR, nl L ventricular systolic function.  Marland Kitchen Heart murmur    since birth. Never has had any problems  . History of kidney stones   . Kidney stones   . Morbid obesity ([place]) 04/29/2019  . Obesity, unspecified   . PONV (postoperative nausea and vomiting)   . Vitamin D deficiency 01/2010   Current Outpatient Medications on File Prior to Visit  Medication Sig Dispense Refill  . albuterol (VENTOLIN HFA) 108 (90 Base) MCG/ACT inhaler Inhale 2 puffs into the lungs every 6 (six) hours as needed for wheezing or shortness of breath. 1 Inhaler 0  . ibuprofen (ADVIL) 800 MG tablet TAKE 1 TABLET(800 MG) BY MOUTH THREE TIMES DAILY 30 tablet 0  . phentermine (ADIPEX-P) 37.5 MG tablet Take 1 tablet (37.5 mg total) by mouth daily before breakfast. (Patient not taking: Reported on 07/20/2019) 30 tablet 0   No current facility-administered medications on file prior to visit.     Objective: Temp (!) 97.3 F (36.3 C) (Oral)   Ht 5'  3" (1.6 m)   Wt 230 lb (104.3 kg)   BMI 40.74 kg/m   General appearance: alert, no distress, well developed, well nourished Not examined in person as this was virtual consult    Assessment: Encounter Diagnoses  Name Primary?  . Ear itch Yes  . Throat irritation   . Allergic rhinitis, unspecified seasonality, unspecified trigger      Plan: We discussed her symptoms and concerns.  We discussed possible causes of of her symptoms.  She does not think she has any type of food trigger or GERD issues.  She still assumes an environmental allergen.  She has had allergy testing in the remote  past.  We discussed the possibility of doing allergy testing again in the future.  For now she will switch to Xyzal below and stop Allegra and Claritin.  She will begin Flonase in the morning.  She will begin nasal saline flush in the nostrils at bedtime or in the evening.  If no improvement within the next 2 weeks, then call back.  [name] was seen today for allergies.  Diagnoses and all orders for this visit:  Ear itch  Throat irritation  Allergic rhinitis, unspecified seasonality, unspecified trigger  Other orders -     levocetirizine (XYZAL) 5 MG tablet; Take 1 tablet (5 mg total) by mouth every evening.    Follow up: prn

## 2019-08-12 ENCOUNTER — Other Ambulatory Visit: Payer: Self-pay | Admitting: Family Medicine

## 2019-09-06 ENCOUNTER — Other Ambulatory Visit: Payer: Self-pay

## 2019-09-06 ENCOUNTER — Encounter (INDEPENDENT_AMBULATORY_CARE_PROVIDER_SITE_OTHER): Payer: Self-pay | Admitting: Family Medicine

## 2019-09-06 ENCOUNTER — Encounter: Payer: Self-pay | Admitting: Family Medicine

## 2019-09-06 ENCOUNTER — Ambulatory Visit (INDEPENDENT_AMBULATORY_CARE_PROVIDER_SITE_OTHER): Payer: BC Managed Care – PPO | Admitting: Family Medicine

## 2019-09-06 VITALS — BP 132/84 | HR 74 | Temp 97.9°F | Ht 63.0 in | Wt 256.0 lb

## 2019-09-06 DIAGNOSIS — R739 Hyperglycemia, unspecified: Secondary | ICD-10-CM

## 2019-09-06 DIAGNOSIS — R5383 Other fatigue: Secondary | ICD-10-CM

## 2019-09-06 DIAGNOSIS — Z0289 Encounter for other administrative examinations: Secondary | ICD-10-CM

## 2019-09-06 DIAGNOSIS — R0602 Shortness of breath: Secondary | ICD-10-CM

## 2019-09-06 DIAGNOSIS — Z1331 Encounter for screening for depression: Secondary | ICD-10-CM | POA: Diagnosis not present

## 2019-09-06 DIAGNOSIS — Z9189 Other specified personal risk factors, not elsewhere classified: Secondary | ICD-10-CM

## 2019-09-06 DIAGNOSIS — D508 Other iron deficiency anemias: Secondary | ICD-10-CM | POA: Diagnosis not present

## 2019-09-06 DIAGNOSIS — Z6841 Body Mass Index (BMI) 40.0 and over, adult: Secondary | ICD-10-CM
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
## 2019-09-07 ENCOUNTER — Ambulatory Visit (INDEPENDENT_AMBULATORY_CARE_PROVIDER_SITE_OTHER): Payer: BC Managed Care – PPO | Admitting: Psychology

## 2019-09-07 DIAGNOSIS — F418 Other specified anxiety disorders: Secondary | ICD-10-CM

## 2019-09-07 LAB — COMPREHENSIVE METABOLIC PANEL
ALT: 13 IU/L (ref 0–32)
AST: 14 IU/L (ref 0–40)
Albumin/Globulin Ratio: 1.4 (ref 1.2–2.2)
Albumin: 4.3 g/dL (ref 3.8–4.9)
Alkaline Phosphatase: 88 IU/L (ref 39–117)
BUN/Creatinine Ratio: 20 (ref 9–23)
BUN: 15 mg/dL (ref 6–24)
Bilirubin Total: 0.7 mg/dL (ref 0.0–1.2)
CO2: 24 mmol/L (ref 20–29)
Calcium: 9.5 mg/dL (ref 8.7–10.2)
Chloride: 101 mmol/L (ref 96–106)
Creatinine, Ser: 0.74 mg/dL (ref 0.57–1.00)
GFR calc Af Amer: 108 mL/min/{1.73_m2} (ref >59)
GFR calc non Af Amer: 93 mL/min/{1.73_m2} (ref >59)
Globulin, Total: 3.1 g/dL (ref 1.5–4.5)
Glucose: 88 mg/dL (ref 65–99)
Potassium: 4.4 mmol/L (ref 3.5–5.2)
Sodium: 140 mmol/L (ref 134–144)
Total Protein: 7.4 g/dL (ref 6.0–8.5)

## 2019-09-07 LAB — VITAMIN D 25 HYDROXY (VIT D DEFICIENCY, FRACTURES): Vit D, 25-Hydroxy: 32.3 ng/mL (ref 30.0–100.0)

## 2019-09-07 LAB — HEMOGLOBIN A1C
Est. average glucose Bld gHb Est-mCnc: 114 mg/dL
Hgb A1c MFr Bld: 5.6 % (ref 4.8–5.6)

## 2019-09-07 LAB — FOLATE: Folate: 7.7 ng/mL (ref >3.0)

## 2019-09-07 LAB — CBC WITH DIFFERENTIAL
Basophils Absolute: 0 10*3/uL (ref 0.0–0.2)
Basos: 1 %
EOS (ABSOLUTE): 0.2 10*3/uL (ref 0.0–0.4)
Eos: 3 %
Hematocrit: 34 % (ref 34.0–46.6)
Hemoglobin: 11 g/dL — ABNORMAL LOW (ref 11.1–15.9)
Immature Grans (Abs): 0 10*3/uL (ref 0.0–0.1)
Immature Granulocytes: 0 %
Lymphocytes Absolute: 2.8 10*3/uL (ref 0.7–3.1)
Lymphs: 35 %
MCH: 28.6 pg (ref 26.6–33.0)
MCHC: 32.4 g/dL (ref 31.5–35.7)
MCV: 89 fL (ref 79–97)
Monocytes Absolute: 0.5 10*3/uL (ref 0.1–0.9)
Monocytes: 7 %
Neutrophils Absolute: 4.5 10*3/uL (ref 1.4–7.0)
Neutrophils: 54 %
RBC: 3.84 x10E6/uL (ref 3.77–5.28)
RDW: 14.5 % (ref 11.7–15.4)
WBC: 8.1 10*3/uL (ref 3.4–10.8)

## 2019-09-07 LAB — LIPID PANEL WITH LDL/HDL RATIO
Cholesterol, Total: 181 mg/dL (ref 100–199)
HDL: 47 mg/dL (ref >39)
LDL Chol Calc (NIH): 121 mg/dL — ABNORMAL HIGH (ref 0–99)
LDL/HDL Ratio: 2.6 ratio (ref 0.0–3.2)
Triglycerides: 71 mg/dL (ref 0–149)
VLDL Cholesterol Cal: 13 mg/dL (ref 5–40)

## 2019-09-07 LAB — INSULIN, RANDOM: INSULIN: 14.6 u[IU]/mL (ref 2.6–24.9)

## 2019-09-07 LAB — T3: T3, Total: 89 ng/dL (ref 71–180)

## 2019-09-07 LAB — VITAMIN B12: Vitamin B-12: 354 pg/mL (ref 232–1245)

## 2019-09-07 LAB — TSH: TSH: 2.25 u[IU]/mL (ref 0.450–4.500)

## 2019-09-07 LAB — T4, FREE: Free T4: 1.17 ng/dL (ref 0.82–1.77)

## 2019-09-07 NOTE — Progress Notes (Signed)
Office: [phone]  /  Fax: [phone]   Dear Loletha Carrow L. Raenette Rover, NP-C,   Thank you for referring CHRISHANA ZWIEG to our clinic. The following note includes my evaluation and treatment recommendations.  HPI:   Chief Complaint: OBESITY    Vanessa Copeland has been referred by Vickie L. Raenette Rover, NP-C for consultation regarding her obesity and obesity related comorbidities.    Vanessa Copeland (MR# [id number]) is a 53 y.o. female who presents on 9/150/2020 for obesity evaluation and treatment. Current BMI is Body mass index is 45.35 kg/m.Marland Kitchen Vanessa Copeland has been struggling with her weight for many years and has been unsuccessful in either losing weight, maintaining weight loss, or reaching her healthy weight goal.     Clista attended our information session and states she is currently in the action stage of change and ready to dedicate time achieving and maintaining a healthier weight. Vanessa Copeland is interested in becoming our patient and working on intensive lifestyle modifications including (but not limited to) diet, exercise and weight loss.    Vanessa Copeland states she thinks her family will eat healthier with her her desired weight loss is 86 lbs. she has been heavy most of  her life she started gaining weight after childbirth her heaviest weight ever was 256 lbs. she is a picky eater and doesn't like to eat healthier foods  she has significant food cravings issues  she snacks frequently in the evenings she is frequently drinking liquids with calories she frequently makes poor food choices she struggles with emotional eating    Fatigue Rogan feels her energy is lower than it should be. This has worsened with weight gain and has not worsened recently. Vanessa Copeland admits to daytime somnolence and she admits to waking up still tired. Patient is at risk for obstructive sleep apnea. Patent has a history of symptoms of daytime fatigue, morning fatigue and morning headache. Patient generally gets 5 hours of sleep per  night, and states they generally have restless sleep. Snoring is present. Apneic episodes are not present. Epworth Sleepiness Score is 8  Dyspnea on exertion Vanessa Copeland notes increasing shortness of breath with exercising and seems to be worsening over time with weight gain. She notes getting out of breath sooner with activity than she used to. This has not gotten worse recently. Vanessa Copeland has a history of asthma, but she has had no recent exacerbations. Vanessa Copeland denies orthopnea.  Iron Deficiency Anemia Vanessa Copeland has a diagnosis of iron deficiency anemia, due to heavy menses. This has improved on Mirena. Her last Hgb was still low.   Hyperglycemia Vanessa Copeland has a history of some elevated fasting blood sugars and A1c of 5.7 without a diagnosis of diabetes. She admits to polyphagia.  At risk for diabetes Vanessa Copeland is at higher than average risk for developing diabetes due to her obesity and hyperglycemia. She currently denies polyuria or polydipsia.  Depression Screen Vanessa Copeland's Food and Mood (modified PHQ-9) score was  Depression screen PHQ 2/9 09/06/2019  Decreased Interest 1  Down, Depressed, Hopeless 1  PHQ - 2 Score 2  Altered sleeping 1  Tired, decreased energy 3  Change in appetite 1  Feeling bad or failure about yourself  0  Trouble concentrating 1  Moving slowly or fidgety/restless 0  Suicidal thoughts 0  PHQ-9 Score 8  Difficult doing work/chores Not difficult at all    ASSESSMENT AND PLAN:  Other fatigue - Plan: EKG 12-Lead, CBC With Differential, Vitamin B12, Folate, T3, T4, free, TSH, VITAMIN D 25 Hydroxy (Vit-D  Deficiency, Fractures)  Shortness of breath on exertion - Plan: Lipid Panel With LDL/HDL Ratio  Other iron deficiency anemia  Hyperglycemia - Plan: Comprehensive metabolic panel, Hemoglobin A1c, Insulin, random  Depression screening  At risk for diabetes mellitus  Class 3 severe obesity with serious comorbidity and body mass index (BMI) of 45.0 to 49.9 in adult,  unspecified obesity type ([place])  PLAN:  Fatigue Vanessa Copeland was informed that her fatigue may be related to obesity, depression or many other causes. Labs will be ordered, and in the meanwhile Vanessa Copeland has agreed to work on diet, exercise and weight loss to help with fatigue. Proper sleep hygiene was discussed including the need for 7-8 hours of quality sleep each night. A sleep study was not ordered based on symptoms and Epworth score.  Dyspnea on exertion Vanessa Copeland's shortness of breath appears to be obesity related and exercise induced. She has agreed to work on weight loss and gradually increase exercise to treat her exercise induced shortness of breath. If Vanessa Copeland follows our instructions and loses weight without improvement of her shortness of breath, we will plan to refer to pulmonology. We will monitor this condition regularly. Vanessa Copeland agrees to this plan.  Iron Deficiency Anemia The diagnosis of Iron deficiency anemia was discussed with Vanessa Copeland and was explained in detail. She was given suggestions of iron rich foods and iron supplement was not prescribed. We will check labs and follow.  Hyperglycemia Fasting labs will be obtained and results with be discussed with Vanessa Copeland in 2 weeks at her follow up visit. In the meanwhile Vanessa Copeland was started on a lower simple carbohydrate diet and will work on weight loss efforts.  Diabetes risk counseling Vanessa Copeland was given extended (15 minutes) diabetes prevention counseling today. She is 53 y.o. female and has risk factors for diabetes including obesity and hyperglycemia. We discussed intensive lifestyle modifications today with an emphasis on weight loss as well as increasing exercise and decreasing simple carbohydrates in her diet.  Depression Screen Vanessa Copeland had a mildly positive depression screening. Depression is commonly associated with obesity and often results in emotional eating behaviors. We will monitor this closely and work on CBT to help improve the  non-hunger eating patterns. Referral to Psychology may be required if no improvement is seen as she continues in our clinic.  Obesity Vanessa Copeland is currently in the action stage of change and her goal is to continue with weight loss efforts. I recommend Amatullah begin the structured treatment plan as follows:  She has agreed to follow the category Ruby has been instructed to eventually work up to a goal of 150 minutes of combined cardio and strengthening exercise per week for weight loss and overall health benefits. We discussed the following Behavioral Modification Strategies today: increasing lean protein intake, decreasing simple carbohydrates , decrease eating out and dealing with family or coworker sabotage   She was informed of the importance of frequent follow up visits to maximize her success with intensive lifestyle modifications for her multiple health conditions. She was informed we would discuss her lab results at her next visit unless there is a critical issue that needs to be addressed sooner. Amylah agreed to keep her next visit at the agreed upon time to discuss these results.  ALLERGIES: Allergies  Allergen Reactions   Shellfish Allergy Shortness Of Breath    MEDICATIONS: Current Outpatient Medications on File Prior to Visit  Medication Sig Dispense Refill   albuterol (VENTOLIN HFA) 108 (90 Base) MCG/ACT inhaler Inhale 2  puffs into the lungs every 6 (six) hours as needed for wheezing or shortness of breath. 1 Inhaler 0   EPINEPHrine 0.3 mg/0.3 mL IJ SOAJ injection Inject 0.3 mg into the muscle as needed for anaphylaxis.     ibuprofen (ADVIL) 800 MG tablet TAKE 1 TABLET(800 MG) BY MOUTH THREE TIMES DAILY 30 tablet 1   levocetirizine (XYZAL) 5 MG tablet Take 1 tablet (5 mg total) by mouth every evening. 30 tablet 2   No current facility-administered medications on file prior to visit.     PAST MEDICAL HISTORY: Past Medical History:  Diagnosis Date   Allergy      Anemia    Anxiety    Asthma    mild intermittent(when allergies are flaring)   Complication of anesthesia    Food allergy    Shellfish   H/O echocardiogram 12/2005   Trace MR, mild TR, nl L ventricular systolic function.   Heart murmur    since birth. Never has had any problems   History of kidney stones    Kidney stones    Morbid obesity ([place]) 04/29/2019   Obesity, unspecified    PONV (postoperative nausea and vomiting)    Vitamin D deficiency 01/2010    PAST SURGICAL HISTORY: Past Surgical History:  Procedure Laterality Date   CESAREAN SECTION     x3   CYSTOSCOPY WITH RETROGRADE PYELOGRAM, URETEROSCOPY AND STENT PLACEMENT Left 07/16/2018   Procedure: CYSTOSCOPY WITH RETROGRADE PYELOGRAM, URETEROSCOPY AND STENT PLACEMENT;  Surgeon: Cleon Gustin, MD;  Location: [facility];  Service: Urology;  Laterality: Left;   EXTRACORPOREAL SHOCK WAVE LITHOTRIPSY Left 06/21/2018   Procedure: LEFT EXTRACORPOREAL SHOCK WAVE LITHOTRIPSY (ESWL);  Surgeon: Franchot Gallo, MD;  Location: [facility];  Service: Urology;  Laterality: Left;   HOLMIUM LASER APPLICATION Left [id number]   Procedure: HOLMIUM LASER APPLICATION;  Surgeon: Cleon Gustin, MD;  Location: [facility];  Service: Urology;  Laterality: Left;   OOPHORECTOMY  2002   left due to cyst   TUBAL LIGATION  1996    SOCIAL HISTORY: Social History   Tobacco Use   Smoking status: Never Smoker   Smokeless tobacco: Never Used  Substance Use Topics   Alcohol use: No   Drug use: No    FAMILY HISTORY: Family History  Problem Relation Age of Onset   Hypertension Mother    Anemia Mother    Colon polyps Mother    Anxiety disorder Mother    Cancer Father        lung   Kidney Stones Father    Hypertension Father    Lung cancer Father 37   Asthma Brother    Asthma Daughter    Allergies Daughter    Asthma Daughter    Hypertension Brother    Breast cancer Neg Hx    Colon cancer Neg Hx     Diabetes Neg Hx    Heart disease Neg Hx     ROS: Review of Systems  Constitutional: Positive for malaise/fatigue.  HENT:       Positive for Hay Fever  Eyes:       Positive for Wear Glasses or Contacts  Respiratory: Positive for shortness of breath (on exertion).   Cardiovascular: Negative for orthopnea.  Genitourinary: Negative for frequency.  Endo/Heme/Allergies: Negative for polydipsia.       Positive for polyphagia  Psychiatric/Behavioral: The patient has insomnia.     PHYSICAL EXAM: Blood pressure 132/84, pulse 74, temperature 97.9 F (36.6 C), temperature source Oral, height  5\' 3"  (1.6 m), weight 256 lb (116.1 kg), SpO2 96 %. Body mass index is 45.35 kg/m. Physical Exam Vitals signs reviewed.  Constitutional:      Appearance: Normal appearance. She is well-developed. She is obese.  HENT:     Head: Normocephalic and atraumatic.     Nose: Nose normal.  Eyes:     General: No scleral icterus.    Extraocular Movements: Extraocular movements intact.  Neck:     Musculoskeletal: Normal range of motion and neck supple.     Thyroid: No thyromegaly.  Cardiovascular:     Rate and Rhythm: Normal rate and regular rhythm.  Pulmonary:     Effort: Pulmonary effort is normal. No respiratory distress.  Abdominal:     Palpations: Abdomen is soft.     Tenderness: There is no abdominal tenderness.  Musculoskeletal: Normal range of motion.     Comments: Range of Motion normal in all 4 extremities  Skin:    General: Skin is warm and dry.  Neurological:     Mental Status: She is alert and oriented to person, place, and time.     Coordination: Coordination normal.  Psychiatric:        Mood and Affect: Mood normal.        Behavior: Behavior normal.     RECENT LABS AND TESTS: BMET    Component Value Date/Time   NA 140 09/06/2019 0840   K 4.4 09/06/2019 0840   CL 101 09/06/2019 0840   CO2 24 09/06/2019 0840   GLUCOSE 88 09/06/2019 0840   GLUCOSE 105 (H) 06/30/2019 1625    BUN 15 09/06/2019 0840   CREATININE 0.74 09/06/2019 0840   CREATININE 0.70 10/21/2012 1145   CALCIUM 9.5 09/06/2019 0840   GFRNONAA 93 09/06/2019 0840   GFRAA 108 09/06/2019 0840   Lab Results  Component Value Date   HGBA1C 5.6 09/06/2019   Lab Results  Component Value Date   INSULIN 14.6 09/06/2019   CBC    Component Value Date/Time   WBC 8.1 09/06/2019 0840   WBC 8.2 06/30/2019 1625   RBC 3.84 09/06/2019 0840   RBC 4.13 06/30/2019 1625   HGB 11.0 (L) 09/06/2019 0840   HCT 34.0 09/06/2019 0840   PLT 406 (H) 06/30/2019 1625   PLT 140 (L) 05/04/2019 1035   MCV 89 09/06/2019 0840   MCH 28.6 09/06/2019 0840   MCH 28.3 06/30/2019 1625   MCHC 32.4 09/06/2019 0840   MCHC 31.7 06/30/2019 1625   RDW 14.5 09/06/2019 0840   LYMPHSABS 2.8 09/06/2019 0840   MONOABS 0.4 06/30/2019 1625   EOSABS 0.2 09/06/2019 0840   BASOSABS 0.0 09/06/2019 0840   Iron/TIBC/Ferritin/ %Sat    Component Value Date/Time   IRON 75 10/21/2012 1145   FERRITIN 36 10/21/2012 1145   Lipid Panel     Component Value Date/Time   CHOL 181 09/06/2019 0840   TRIG 71 09/06/2019 0840   HDL 47 09/06/2019 0840   LDLCALC 136 (H) 05/04/2019 1035   Hepatic Function Panel     Component Value Date/Time   PROT 7.4 09/06/2019 0840   ALBUMIN 4.3 09/06/2019 0840   AST 14 09/06/2019 0840   ALT 13 09/06/2019 0840   ALKPHOS 88 09/06/2019 0840   BILITOT 0.7 09/06/2019 0840      Component Value Date/Time   TSH 2.250 09/06/2019 0840   TSH 3.410 05/04/2019 1035   TSH 1.892 10/21/2012 1145    ECG  shows NSR with a rate of  77 BPM INDIRECT CALORIMETER done today shows a VO2 of 275 and a REE of 1911.  Her calculated basal metabolic rate is 99991111 thus her basal metabolic rate is better than expected.     Ref. Range 10/21/2012 11:45  Vitamin D, 25-Hydroxy Latest Ref Range: 30 - 89 ng/mL 28 (L)    OBESITY BEHAVIORAL INTERVENTION VISIT  Today's visit was # 1   Starting weight: 256 lbs Starting date:  09/06/2019 Today's weight : 256 lbs  Today's date: 09/06/2019 Total lbs lost to date: 0    09/06/2019  Height 5\' 3"  (1.6 m)  Weight 256 lb (116.1 kg)  BMI (Calculated) 45.36  BLOOD PRESSURE - SYSTOLIC Q000111Q  BLOOD PRESSURE - DIASTOLIC 84   Body Fat % Q000111Q %  Total Body Water (lbs) 90.2 lbs  RMR 1911    ASK: We discussed the diagnosis of obesity with [name] today and Riyana agreed to give [place] permission to discuss obesity behavioral modification therapy today.  ASSESS: Brytnee has the diagnosis of obesity and her BMI today is 45.36 Shatora is in the action stage of change   ADVISE: Keitlyn was educated on the multiple health risks of obesity as well as the benefit of weight loss to improve her health. She was advised of the need for long term treatment and the importance of lifestyle modifications to improve her current health and to decrease her risk of future health problems.  AGREE: Multiple dietary modification options and treatment options were discussed and  Bambie agreed to follow the recommendations documented in the above note.  ARRANGE: Pamale was educated on the importance of frequent visits to treat obesity as outlined per CMS and USPSTF guidelines and agreed to schedule her next follow up appointment today.  I, Doreene Nest, am acting as transcriptionist for Dennard Nip, MD  I have reviewed the above documentation for accuracy and completeness, and I agree with the above. -Dennard Nip, MD

## 2019-09-12 ENCOUNTER — Encounter: Payer: Self-pay | Admitting: Family Medicine

## 2019-09-12 ENCOUNTER — Ambulatory Visit (INDEPENDENT_AMBULATORY_CARE_PROVIDER_SITE_OTHER): Payer: BC Managed Care – PPO | Admitting: Family Medicine

## 2019-09-12 ENCOUNTER — Other Ambulatory Visit: Payer: Self-pay

## 2019-09-12 VITALS — BP 120/70 | HR 60 | Temp 98.0°F | Wt 257.0 lb

## 2019-09-12 DIAGNOSIS — Z23 Encounter for immunization: Secondary | ICD-10-CM

## 2019-09-12 DIAGNOSIS — F419 Anxiety disorder, unspecified: Secondary | ICD-10-CM | POA: Diagnosis not present

## 2019-09-12 DIAGNOSIS — Z87898 Personal history of other specified conditions: Secondary | ICD-10-CM

## 2019-09-12 NOTE — Progress Notes (Signed)
   Subjective:    Patient ID: Vanessa Copeland, female    DOB: 02-Feb-1966, 53 y.o.   MRN: [id number]  HPI Chief Complaint  Patient presents with  . ER follow-up    ER Follow-up, aneixty attack, had 2 attacks, given ativan in ER and helped   Here with concerns regarding anxiety. States she was supposed to follow up on panic attacks from July and her ED visit but this the first chance she has had to do it.   States in July she was talking to someone at work about her father passing away. He passed 15 years ago.  States she felt a very strange feeling. She asked her co-worker to call 911. She had a negative cardiac work up. Anxiety and symptoms resolved with Ativan.   States anxiety flared up again when she went to her weight management appointment. She was referred to a psychologist and has a follow up appt in 2 weeks. She reports feeling somewhat better and able to calm herself when she starts feeling anxious.  States she feels back her her norm today.  Denies fever, chills, dizziness, chest pain, palpitations, shortness of breath, abdominal pain, N/V/D, urinary symptoms, LE edema.     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   Reviewed allergies, medications, past medical, surgical, family, and social history.    Review of Systems Pertinent positives and negatives in the history of present illness.     Objective:   Physical Exam BP 120/70   Pulse 60   Temp 98 F (36.7 C)   Wt 257 lb (116.6 kg)   BMI 45.53 kg/m   Alert and oriented and in no acute distress.  Respirations unlabored.  Normal mood and thought process.      Assessment & Plan:  Anxiety- She is not in any acute distress.  She is seeing a psychologist and encouraged her to continue doing so.  Congratulated her on finally being able to work through her grief and history of traumatic events.  She will return for her CPE in the next couple of months  History of chest pain -Negative cardiac work-up in the  ED.  Most likely related to anxiety.  Asymptomatic currently  Needs flu shot - Plan: Flu Vaccine QUAD 36+ mos IM

## 2019-09-12 NOTE — Patient Instructions (Signed)
Keep up the good work with your healthy diet and exercising. It will pay off!

## 2019-09-14 DIAGNOSIS — Z1231 Encounter for screening mammogram for malignant neoplasm of breast: Secondary | ICD-10-CM | POA: Diagnosis not present

## 2019-09-14 LAB — HM MAMMOGRAPHY

## 2019-09-20 ENCOUNTER — Encounter: Payer: Self-pay | Admitting: Internal Medicine

## 2019-09-20 ENCOUNTER — Other Ambulatory Visit: Payer: Self-pay

## 2019-09-20 ENCOUNTER — Encounter (INDEPENDENT_AMBULATORY_CARE_PROVIDER_SITE_OTHER): Payer: Self-pay | Admitting: Family Medicine

## 2019-09-20 ENCOUNTER — Ambulatory Visit (INDEPENDENT_AMBULATORY_CARE_PROVIDER_SITE_OTHER): Payer: BC Managed Care – PPO | Admitting: Family Medicine

## 2019-09-20 VITALS — BP 125/75 | HR 70 | Temp 97.5°F | Ht 63.0 in | Wt 253.0 lb

## 2019-09-20 DIAGNOSIS — E559 Vitamin D deficiency, unspecified: Secondary | ICD-10-CM

## 2019-09-20 DIAGNOSIS — R7303 Prediabetes: Secondary | ICD-10-CM

## 2019-09-20 DIAGNOSIS — E7849 Other hyperlipidemia: Secondary | ICD-10-CM | POA: Diagnosis not present

## 2019-09-20 DIAGNOSIS — Z6841 Body Mass Index (BMI) 40.0 and over, adult: Secondary | ICD-10-CM

## 2019-09-20 DIAGNOSIS — Z9189 Other specified personal risk factors, not elsewhere classified: Secondary | ICD-10-CM | POA: Diagnosis not present

## 2019-09-20 MED ORDER — VITAMIN D (ERGOCALCIFEROL) 1.25 MG (50000 UNIT) PO CAPS: 50000.0000 [IU] | ORAL_CAPSULE | ORAL | 0 refills | Status: DC

## 2019-09-21 ENCOUNTER — Encounter (INDEPENDENT_AMBULATORY_CARE_PROVIDER_SITE_OTHER): Payer: Self-pay | Admitting: Family Medicine

## 2019-09-21 NOTE — Telephone Encounter (Signed)
Please advise 

## 2019-09-21 NOTE — Progress Notes (Signed)
Office: [phone]  /  Fax: [phone]   HPI:   Chief Complaint: OBESITY Vanessa Copeland is here to discuss her progress with her obesity treatment plan. She is on the Category 3 plan and is following her eating plan approximately 80 to 90 % of the time. She states she is walking 25 minutes 5 times per week. Vanessa Copeland did well with weight loss on her Category 3 plan. Hunger improved after a couple of days. She didn't like yogurt, and she struggled to eat all her protein. Her weight is 253 lb (114.8 kg) today and has had a weight loss of 3 pounds over a period of 2 weeks since her last visit. She has lost 3 lbs since starting treatment with [place].  Hyperlipidemia Vanessa Copeland has hyperlipidemia. Her LDL is elevated. She is not on statin. Vanessa Copeland is attempting to  improve her cholesterol levels with intensive lifestyle modification including a low saturated fat diet, exercise and weight loss. She denies any chest pain or myalgias.  Vitamin D deficiency Vanessa Copeland has a new diagnosis of vitamin D deficiency. Her vitamin D level was at 32.3 on 09/06/19 and was not at goal. She is not currently taking vit D and denies nausea, vomiting or muscle weakness.  Pre-Diabetes Vanessa Copeland has a new diagnosis of prediabetes based on her elevated Hgb A1c and insulin level. She was informed this puts her at greater risk of developing diabetes. She had polyphagia, but this improved on diet prescription. She continues to work on diet and exercise to decrease risk of diabetes.  At risk for diabetes Vanessa Copeland is at higher than average risk for developing diabetes due to her obesity and prediabetes. She currently denies polyuria or polydipsia.  ASSESSMENT AND PLAN:  Other hyperlipidemia  Vitamin D deficiency - Plan: Vitamin D, Ergocalciferol, (DRISDOL) 1.25 MG (50000 UT) CAPS capsule  Prediabetes  At risk for diabetes mellitus  Class 3 severe obesity with serious comorbidity and body mass index (BMI) of 40.0 to 44.9 in adult,  unspecified obesity type (Vanessa Copeland)  PLAN:  Hyperlipidemia Vanessa Copeland was informed of the American Heart Association Guidelines emphasizing intensive lifestyle modifications as the first line treatment for hyperlipidemia. We discussed many lifestyle modifications today in depth, and Vanessa Copeland will continue to work on decreasing saturated fats such as fatty red meat, butter and many fried foods. She will also increase vegetables and lean protein in her diet and continue to work on exercise and weight loss efforts. We will recheck labs in 3 months.  Vitamin D Deficiency Vanessa Copeland was informed that low vitamin D levels contributes to fatigue and are associated with obesity, breast, and colon cancer. Vanessa Copeland agrees to start prescription Vit D @50 ,000 IU every week #4 with no refills and she will follow up for routine testing of vitamin D, at least 2-3 times per year. She was informed of the risk of over-replacement of vitamin D and agrees to not increase her dose unless she discusses this with [place] first. Vanessa Copeland agrees to follow up with our clinic in 2 weeks.  Pre-Diabetes We will defer metformin and Vanessa Copeland will continue to work on weight loss, exercise, and decreasing simple carbohydrates in her diet to help decrease the risk of diabetes. She was informed that eating too many simple carbohydrates or too many calories at one sitting increases the likelihood of GI side effects. We will recheck labs in 3 months and Vanessa Copeland agreed to follow up with [place] as directed to monitor her progress.  Diabetes risk counseling Vanessa Copeland was given extended (  30 minutes) diabetes prevention counseling today. She is 53 y.o. female and has risk factors for diabetes including obesity and prediabetes. We discussed intensive lifestyle modifications today with an emphasis on weight loss as well as increasing exercise and decreasing simple carbohydrates in her diet.  Obesity Vanessa Copeland is currently in the action stage of change. As such, her goal is to  continue with weight loss efforts She has agreed to follow the Category 3 plan Vanessa Copeland has been instructed to work up to a goal of 150 minutes of combined cardio and strengthening exercise per week for weight loss and overall health benefits. We discussed the following Behavioral Modification Strategies today: increasing lean protein intake, decreasing simple carbohydrates  and dealing with family or coworker sabotage  Vanessa Copeland has agreed to follow up with our clinic in 2 weeks. She was informed of the importance of frequent follow up visits to maximize her success with intensive lifestyle modifications for her multiple health conditions.  ALLERGIES: Allergies  Allergen Reactions  . Shellfish Allergy Shortness Of Breath    MEDICATIONS: Current Outpatient Medications on File Prior to Visit  Medication Sig Dispense Refill  . albuterol (VENTOLIN HFA) 108 (90 Base) MCG/ACT inhaler Inhale 2 puffs into the lungs every 6 (six) hours as needed for wheezing or shortness of breath. 1 Inhaler 0  . EPINEPHrine 0.3 mg/0.3 mL IJ SOAJ injection Inject 0.3 mg into the muscle as needed for anaphylaxis.    Marland Kitchen ibuprofen (ADVIL) 800 MG tablet TAKE 1 TABLET(800 MG) BY MOUTH THREE TIMES DAILY 30 tablet 1  . levocetirizine (XYZAL) 5 MG tablet Take 1 tablet (5 mg total) by mouth every evening. 30 tablet 2   No current facility-administered medications on file prior to visit.     PAST MEDICAL HISTORY: Past Medical History:  Diagnosis Date  . Allergy   . Anemia   . Anxiety   . Asthma    mild intermittent(when allergies are flaring)  . Complication of anesthesia   . Food allergy    Shellfish  . H/O echocardiogram 12/2005   Trace MR, mild TR, nl L ventricular systolic function.  Marland Kitchen Heart murmur    since birth. Never has had any problems  . History of kidney stones   . Kidney stones   . Morbid obesity ([place]) 04/29/2019  . Obesity, unspecified   . PONV (postoperative nausea and vomiting)   . Vitamin D  deficiency 01/2010    PAST SURGICAL HISTORY: Past Surgical History:  Procedure Laterality Date  . CESAREAN SECTION     x3  . CYSTOSCOPY WITH RETROGRADE PYELOGRAM, URETEROSCOPY AND STENT PLACEMENT Left 07/16/2018   Procedure: CYSTOSCOPY WITH RETROGRADE PYELOGRAM, URETEROSCOPY AND STENT PLACEMENT;  Surgeon: Cleon Gustin, MD;  Location: [facility];  Service: Urology;  Laterality: Left;  . EXTRACORPOREAL SHOCK WAVE LITHOTRIPSY Left 06/21/2018   Procedure: LEFT EXTRACORPOREAL SHOCK WAVE LITHOTRIPSY (ESWL);  Surgeon: Franchot Gallo, MD;  Location: [facility];  Service: Urology;  Laterality: Left;  . HOLMIUM LASER APPLICATION Left [id number]   Procedure: HOLMIUM LASER APPLICATION;  Surgeon: Cleon Gustin, MD;  Location: [facility];  Service: Urology;  Laterality: Left;  . OOPHORECTOMY  2002   left due to cyst  . TUBAL LIGATION  1996    SOCIAL HISTORY: Social History   Tobacco Use  . Smoking status: Never Smoker  . Smokeless tobacco: Never Used  Substance Use Topics  . Alcohol use: No  . Drug use: No    FAMILY HISTORY: Family History  Problem  Relation Age of Onset  . Hypertension Mother   . Anemia Mother   . Colon polyps Mother   . Anxiety disorder Mother   . Cancer Father        lung  . Kidney Stones Father   . Hypertension Father   . Lung cancer Father 84  . Asthma Brother   . Asthma Daughter   . Allergies Daughter   . Asthma Daughter   . Hypertension Brother   . Breast cancer Neg Hx   . Colon cancer Neg Hx   . Diabetes Neg Hx   . Heart disease Neg Hx     ROS: Review of Systems  Constitutional: Positive for weight loss.  Cardiovascular: Negative for chest pain.  Gastrointestinal: Negative for nausea and vomiting.  Genitourinary: Negative for frequency.  Musculoskeletal: Negative for myalgias.       Negative for muscle weakness  Endo/Heme/Allergies: Negative for polydipsia.       Negative for polyphagia    PHYSICAL EXAM: Blood pressure 125/75, pulse 70,  temperature (!) 97.5 F (36.4 C), temperature source Oral, height 5\' 3"  (1.6 m), weight 253 lb (114.8 kg), SpO2 98 %. Body mass index is 44.82 kg/m. Physical Exam Vitals signs reviewed.  Constitutional:      Appearance: Normal appearance. She is well-developed. She is obese.  Cardiovascular:     Rate and Rhythm: Normal rate.  Pulmonary:     Effort: Pulmonary effort is normal.  Musculoskeletal: Normal range of motion.  Skin:    General: Skin is warm and dry.  Neurological:     Mental Status: She is alert and oriented to person, place, and time.  Psychiatric:        Mood and Affect: Mood normal.        Behavior: Behavior normal.     RECENT LABS AND TESTS: BMET    Component Value Date/Time   NA 140 09/06/2019 0840   K 4.4 09/06/2019 0840   CL 101 09/06/2019 0840   CO2 24 09/06/2019 0840   GLUCOSE 88 09/06/2019 0840   GLUCOSE 105 (H) 06/30/2019 1625   BUN 15 09/06/2019 0840   CREATININE 0.74 09/06/2019 0840   CREATININE 0.70 10/21/2012 1145   CALCIUM 9.5 09/06/2019 0840   GFRNONAA 93 09/06/2019 0840   GFRAA 108 09/06/2019 0840   Lab Results  Component Value Date   HGBA1C 5.6 09/06/2019   HGBA1C 5.7 (H) 05/04/2019   Lab Results  Component Value Date   INSULIN 14.6 09/06/2019   CBC    Component Value Date/Time   WBC 8.1 09/06/2019 0840   WBC 8.2 06/30/2019 1625   RBC 3.84 09/06/2019 0840   RBC 4.13 06/30/2019 1625   HGB 11.0 (L) 09/06/2019 0840   HCT 34.0 09/06/2019 0840   PLT 406 (H) 06/30/2019 1625   PLT 140 (L) 05/04/2019 1035   MCV 89 09/06/2019 0840   MCH 28.6 09/06/2019 0840   MCH 28.3 06/30/2019 1625   MCHC 32.4 09/06/2019 0840   MCHC 31.7 06/30/2019 1625   RDW 14.5 09/06/2019 0840   LYMPHSABS 2.8 09/06/2019 0840   MONOABS 0.4 06/30/2019 1625   EOSABS 0.2 09/06/2019 0840   BASOSABS 0.0 09/06/2019 0840   Iron/TIBC/Ferritin/ %Sat    Component Value Date/Time   IRON 75 10/21/2012 1145   FERRITIN 36 10/21/2012 1145   Lipid Panel      Component Value Date/Time   CHOL 181 09/06/2019 0840   TRIG 71 09/06/2019 0840   HDL 47 09/06/2019 0840  LDLCALC 121 (H) 09/06/2019 0840   Hepatic Function Panel     Component Value Date/Time   PROT 7.4 09/06/2019 0840   ALBUMIN 4.3 09/06/2019 0840   AST 14 09/06/2019 0840   ALT 13 09/06/2019 0840   ALKPHOS 88 09/06/2019 0840   BILITOT 0.7 09/06/2019 0840      Component Value Date/Time   TSH 2.250 09/06/2019 0840   TSH 3.410 05/04/2019 1035   TSH 1.892 10/21/2012 1145     Ref. Range 09/06/2019 08:40  Vitamin D, 25-Hydroxy Latest Ref Range: 30.0 - 100.0 ng/mL 32.3    OBESITY BEHAVIORAL INTERVENTION VISIT  Today's visit was # 2   Starting weight: 256 lbs Starting date: 09/06/2019 Today's weight : 253 lbs Today's date: 09/20/2019 Total lbs lost to date: 3    09/20/2019  Height 5\' 3"  (1.6 m)  Weight 253 lb (114.8 kg)  BMI (Calculated) 44.83  BLOOD PRESSURE - SYSTOLIC 0000000  BLOOD PRESSURE - DIASTOLIC 75   Body Fat % [id number] %  Total Body Water (lbs) 92.4 lbs    ASK: We discussed the diagnosis of obesity with Vanessa Copeland today and Vanessa Copeland agreed to give [place] permission to discuss obesity behavioral modification therapy today.  ASSESS: Vanessa Copeland has the diagnosis of obesity and her BMI today is 44.83 Vanessa Copeland is in the action stage of change   ADVISE: Vanessa Copeland was educated on the multiple health risks of obesity as well as the benefit of weight loss to improve her health. She was advised of the need for long term treatment and the importance of lifestyle modifications to improve her current health and to decrease her risk of future health problems.  AGREE: Multiple dietary modification options and treatment options were discussed and  Vanessa Copeland agreed to follow the recommendations documented in the above note.  ARRANGE: Vanessa Copeland was educated on the importance of frequent visits to treat obesity as outlined per CMS and USPSTF guidelines and agreed to schedule her next follow up  appointment today.  I, Doreene Nest, am acting as transcriptionist for Dennard Nip, MD I have reviewed the above documentation for accuracy and completeness, and I agree with the above. -Dennard Nip, MD

## 2019-09-26 ENCOUNTER — Telehealth: Payer: Self-pay

## 2019-09-26 NOTE — Progress Notes (Addendum)
Office: [phone]  /  Fax: [phone]    Date: September 27, 2019   Appointment Start Time: 4:09pm Duration: 21 minutes Provider: Glennie Isle, Psy.D. Type of Session: Individual Therapy  Location of Patient: Home Location of Provider: [facility] Type of Contact: Telepsychological Visit via [employer]   Session Content: Of note, this provider called Darcell at 4:07pm as she did not present for the [facility] appointment. She indicated she could not find the e-mail for today; therefore, the e-mail with the secure link was re-sent. As such, today's appointment was initiated 9 minutes late. Vanessa Copeland is a 53 y.o. female presenting via [place] for a follow-up appointment to address the previously established treatment goal of decreasing emotional eating. Today's appointment was a telepsychological visit, as it is an option for appointments to reduce exposure to COVID-19. Vanessa Copeland expressed understanding regarding the rationale for telepsychological services, and provided verbal consent for today's appointment. Prior to proceeding with today's appointment, Vanessa Copeland's physical location at the time of this appointment was obtained. Vanessa Copeland reported she was at home and provided the address. In the event of technical difficulties, Vanessa Copeland shared a phone number she could be reached at. Vanessa Copeland and this provider participated in today's telepsychological service. Also, Vanessa Copeland denied anyone else being present in the room or on the WebEx appointment.  This provider conducted a brief check-in and verbally administered the PHQ-9 and GAD-7. Vanessa Copeland shared, "Everythings been going good. I haven't had any issues." She shared eating is "going good" and noted a reduction in cravings for sweets. Vanessa Copeland denied episodes of emotional eating since the last appointment with this provider. Psychoeducation regarding triggers for emotional eating was provided. Vanessa Copeland was provided a handout, and encouraged to utilize  the handout between now and the next appointment to increase awareness of triggers and frequency. Vanessa Copeland agreed. This provider also discussed behavioral strategies for specific triggers, such as placing the utensil down when conversing to avoid mindless eating. Vanessa Copeland provided verbal consent during today's appointment for this provider to send the handout about triggers via e-mail. Vanessa Copeland was receptive to today's session as evidenced by openness to sharing, responsiveness to feedback, and willingness to explore triggers for emotional eating.  Mental Status Examination:  Appearance: neat Behavior: cooperative Mood: euthymic Affect: mood congruent Speech: normal in rate, volume, and tone Eye Contact: appropriate Psychomotor Activity: appropriate Thought Process: linear, logical, and goal directed  Content/Perceptual Disturbances: no hallucinations, delusions, bizarre thinking or behavior reported or observed and no evidence of suicidal and homicidal ideation, plan, and intent Orientation: time, person, place and purpose of appointment Cognition/Sensorium: memory, attention, language, and fund of knowledge intact  Insight: good Judgment: good  Structured Assessment Results: The Patient Health Questionnaire-9 (PHQ-9) is a self-report measure that assesses symptoms and severity of depression over the course of the last two weeks. Vanessa Copeland obtained a score of 0. Little interest or pleasure in doing things 0  Feeling down, depressed, or hopeless 0  Trouble falling or staying asleep, or sleeping too much 0  Feeling tired or having little energy 0  Poor appetite or overeating 0  Feeling bad about yourself --- or that you are a failure or have let yourself or your family down 0  Trouble concentrating on things, such as reading the newspaper or watching television 0  Moving or speaking so slowly that other people could have noticed? Or the opposite --- being so fidgety or restless that you have been  moving around a lot more than usual 0  Thoughts that you would be better off dead or hurting yourself in some way 0  PHQ-9 Score 0    The Generalized Anxiety Disorder-7 (GAD-7) is a brief self-report measure that assesses symptoms of anxiety over the course of the last two weeks. Vanessa Copeland obtained a score of 0. Feeling nervous, anxious, on edge 0  Not being able to stop or control worrying 0  Worrying too much about different things 0  Trouble relaxing 0  Being so restless that it's hard to sit still 0  Becoming easily annoyed or irritable 0  Feeling afraid as if something awful might happen 0  GAD-7 Score 0   Interventions:  Conducted a brief chart review Verbal administration of PHQ-9 and GAD-7 for symptom monitoring Provided empathic reflections and validation Reviewed content from the previous session Psychoeducation provided regarding triggers for emotional eating Focused on rapport building Employed supportive psychotherapy interventions to facilitate reduced distress, and to improve coping skills with identified stressors  DSM-5 Diagnosis: 300.09 (F41.8) Other Specified Anxiety Disorder, Emotional Eating Behaviors  Treatment Goal & Progress: During the initial appointment with this provider, the following treatment goal was established: decrease emotional eating. Vanessa Copeland has demonstrated some progress in her goal as evidenced by increased awareness of hunger patterns.  Plan: Vanessa Copeland continues to appear able and willing to participate as evidenced by engagement in reciprocal conversation, and asking questions for clarification as appropriate. Per Vanessa Copeland's request, the next appointment will be scheduled in three weeks, which will be via [employer]. The next session will focus on the introduction of mindfulness.

## 2019-09-26 NOTE — Telephone Encounter (Signed)
Pt states she has a Mirena IUD. She has been having bleeding/cramping. She thinks it is time to have it replaced.    Clarise Cruz, please schedule IUD check/possible replacement in either office with AMS. Not an emergency, just first available is fine.

## 2019-09-26 NOTE — Telephone Encounter (Signed)
Called spoke with patient to schedule, patient is going to send message thru [place] before scheduling appointment

## 2019-09-27 ENCOUNTER — Ambulatory Visit (INDEPENDENT_AMBULATORY_CARE_PROVIDER_SITE_OTHER): Payer: BC Managed Care – PPO | Admitting: Psychology

## 2019-09-27 ENCOUNTER — Other Ambulatory Visit: Payer: Self-pay

## 2019-09-27 DIAGNOSIS — F418 Other specified anxiety disorders: Secondary | ICD-10-CM | POA: Diagnosis not present

## 2019-10-04 ENCOUNTER — Ambulatory Visit (INDEPENDENT_AMBULATORY_CARE_PROVIDER_SITE_OTHER): Payer: BC Managed Care – PPO | Admitting: Family Medicine

## 2019-10-04 ENCOUNTER — Other Ambulatory Visit: Payer: Self-pay

## 2019-10-04 VITALS — BP 135/88 | HR 73 | Temp 97.3°F | Ht 63.0 in | Wt 249.0 lb

## 2019-10-04 DIAGNOSIS — Z9189 Other specified personal risk factors, not elsewhere classified: Secondary | ICD-10-CM

## 2019-10-04 DIAGNOSIS — E559 Vitamin D deficiency, unspecified: Secondary | ICD-10-CM | POA: Diagnosis not present

## 2019-10-04 DIAGNOSIS — R7303 Prediabetes: Secondary | ICD-10-CM

## 2019-10-04 DIAGNOSIS — Z6841 Body Mass Index (BMI) 40.0 and over, adult: Secondary | ICD-10-CM

## 2019-10-04 MED ORDER — METFORMIN HCL 500 MG PO TABS: 500.0000 mg | ORAL_TABLET | Freq: Every day | ORAL | 0 refills | Status: DC

## 2019-10-10 ENCOUNTER — Ambulatory Visit: Payer: BC Managed Care – PPO | Admitting: Obstetrics and Gynecology

## 2019-10-10 NOTE — Progress Notes (Signed)
Office: [phone]  /  Fax: [phone]   HPI:   Chief Complaint: OBESITY Vanessa Copeland is here to discuss her progress with her obesity treatment plan. She is on the Category 3 plan and is following her eating plan approximately 90 % of the time. She states she is walking 30 minutes 5 times per week. Vanessa Copeland is doing well on the plan. She does not like yogurt and she is substituting cheese. Her weight is 249 lb (112.9 kg) today and has had a weight loss of 4 pounds over a period of 2 weeks since her last visit. She has lost 7 lbs since starting treatment with [place].  Pre-Diabetes Vanessa Copeland has a diagnosis of prediabetes based on her elevated Hgb A1c and was informed this puts her at greater risk of developing diabetes. Vanessa Copeland is not taking metformin currently.She would like something to help with weight loss. Vanessa Copeland continues to work on diet and exercise to decrease risk of diabetes. She admits to occasional polyphagia.  At risk for diabetes Vanessa Copeland is at higher than average risk for developing diabetes due to her obesity and prediabetes. She currently denies polyuria or polydipsia.  Vitamin D deficiency Vanessa Copeland has a diagnosis of vitamin D deficiency. She is currently taking vit D. Her last vitamin D goal was at 32.3 on 09/06/19 and was not at goal. She denies nausea, vomiting or muscle weakness.  ASSESSMENT AND PLAN:  Prediabetes  Vitamin D deficiency  At risk for diabetes mellitus  Class 3 severe obesity with serious comorbidity and body mass index (BMI) of 40.0 to 44.9 in adult, unspecified obesity type (Vanessa Copeland)  PLAN:  Pre-Diabetes Vanessa Copeland will continue to work on weight loss, exercise, and decreasing simple carbohydrates in her diet to help decrease the risk of diabetes. We dicussed metformin including benefits and risks. She was informed that eating too many simple carbohydrates or too many calories at one sitting increases the likelihood of GI side effects. Vanessa Copeland agrees to start metformin  500 mg qAM #30 with no refills and follow up with [place] as directed to monitor her progress.  Diabetes risk counseling Vanessa Copeland was given extended (15 minutes) diabetes prevention counseling today. She is 53 y.o. female and has risk factors for diabetes including obesity and prediabetes. We discussed intensive lifestyle modifications today with an emphasis on weight loss as well as increasing exercise and decreasing simple carbohydrates in her diet.  Vitamin D Deficiency Vanessa Copeland was informed that low vitamin D levels contributes to fatigue and are associated with obesity, breast, and colon cancer. She agrees to continue to take prescription Vit D @50 ,000 IU every week (no refill needed) and she will follow up for routine testing of vitamin D, at least 2-3 times per year. She was informed of the risk of over-replacement of vitamin D and agrees to not increase her dose unless she discusses this with [place] first.  Obesity Vanessa Copeland is currently in the action stage of change. As such, her goal is to continue with weight loss efforts She has agreed to follow the Category 3 plan with breakfast and lunch options Vanessa Copeland will continue walking 30 minutes, 5 times per week for weight loss and overall health benefits. We discussed the following Behavioral Modification Strategies today: planning for success, increasing lean protein intake and work on meal planning and easy cooking plans  Handouts for dinner ideas was given to patient today.  Vanessa Copeland has agreed to follow up with our clinic in 2 weeks. She was informed of the importance of frequent  follow up visits to maximize her success with intensive lifestyle modifications for her multiple health conditions.  ALLERGIES: Allergies  Allergen Reactions  . Shellfish Allergy Shortness Of Breath    MEDICATIONS: Current Outpatient Medications on File Prior to Visit  Medication Sig Dispense Refill  . albuterol (VENTOLIN HFA) 108 (90 Base) MCG/ACT inhaler Inhale 2 puffs  into the lungs every 6 (six) hours as needed for wheezing or shortness of breath. 1 Inhaler 0  . EPINEPHrine 0.3 mg/0.3 mL IJ SOAJ injection Inject 0.3 mg into the muscle as needed for anaphylaxis.    Marland Kitchen ibuprofen (ADVIL) 800 MG tablet TAKE 1 TABLET(800 MG) BY MOUTH THREE TIMES DAILY 30 tablet 1  . levocetirizine (XYZAL) 5 MG tablet Take 1 tablet (5 mg total) by mouth every evening. 30 tablet 2  . Vitamin D, Ergocalciferol, (DRISDOL) 1.25 MG (50000 UT) CAPS capsule Take 1 capsule (50,000 Units total) by mouth every 7 (seven) days. 4 capsule 0   No current facility-administered medications on file prior to visit.     PAST MEDICAL HISTORY: Past Medical History:  Diagnosis Date  . Allergy   . Anemia   . Anxiety   . Asthma    mild intermittent(when allergies are flaring)  . Complication of anesthesia   . Food allergy    Shellfish  . H/O echocardiogram 12/2005   Trace MR, mild TR, nl L ventricular systolic function.  Marland Kitchen Heart murmur    since birth. Never has had any problems  . History of kidney stones   . Kidney stones   . Morbid obesity ([place]) 04/29/2019  . Obesity, unspecified   . PONV (postoperative nausea and vomiting)   . Vitamin D deficiency 01/2010    PAST SURGICAL HISTORY: Past Surgical History:  Procedure Laterality Date  . CESAREAN SECTION     x3  . CYSTOSCOPY WITH RETROGRADE PYELOGRAM, URETEROSCOPY AND STENT PLACEMENT Left 07/16/2018   Procedure: CYSTOSCOPY WITH RETROGRADE PYELOGRAM, URETEROSCOPY AND STENT PLACEMENT;  Surgeon: Cleon Gustin, MD;  Location: [facility];  Service: Urology;  Laterality: Left;  . EXTRACORPOREAL SHOCK WAVE LITHOTRIPSY Left 06/21/2018   Procedure: LEFT EXTRACORPOREAL SHOCK WAVE LITHOTRIPSY (ESWL);  Surgeon: Franchot Gallo, MD;  Location: [facility];  Service: Urology;  Laterality: Left;  . HOLMIUM LASER APPLICATION Left [id number]   Procedure: HOLMIUM LASER APPLICATION;  Surgeon: Cleon Gustin, MD;  Location: [facility];  Service: Urology;   Laterality: Left;  . OOPHORECTOMY  2002   left due to cyst  . TUBAL LIGATION  1996    SOCIAL HISTORY: Social History   Tobacco Use  . Smoking status: Never Smoker  . Smokeless tobacco: Never Used  Substance Use Topics  . Alcohol use: No  . Drug use: No    FAMILY HISTORY: Family History  Problem Relation Age of Onset  . Hypertension Mother   . Anemia Mother   . Colon polyps Mother   . Anxiety disorder Mother   . Cancer Father        lung  . Kidney Stones Father   . Hypertension Father   . Lung cancer Father 24  . Asthma Brother   . Asthma Daughter   . Allergies Daughter   . Asthma Daughter   . Hypertension Brother   . Breast cancer Neg Hx   . Colon cancer Neg Hx   . Diabetes Neg Hx   . Heart disease Neg Hx     ROS: Review of Systems  Constitutional: Positive for weight loss.  Gastrointestinal: Negative for nausea and vomiting.  Genitourinary: Negative for frequency.  Musculoskeletal:       Negative for muscle weakness  Endo/Heme/Allergies: Negative for polydipsia.       Positive for polyphagia    PHYSICAL EXAM: Blood pressure 135/88, pulse 73, temperature (!) 97.3 F (36.3 C), temperature source Oral, height 5\' 3"  (1.6 m), weight 249 lb (112.9 kg), SpO2 99 %. Body mass index is 44.11 kg/m. Physical Exam Vitals signs reviewed.  Constitutional:      Appearance: Normal appearance. She is well-developed. She is obese.  Cardiovascular:     Rate and Rhythm: Normal rate.  Pulmonary:     Effort: Pulmonary effort is normal.  Musculoskeletal: Normal range of motion.  Skin:    General: Skin is warm and dry.  Neurological:     Mental Status: She is alert and oriented to person, place, and time.  Psychiatric:        Mood and Affect: Mood normal.        Behavior: Behavior normal.     RECENT LABS AND TESTS: BMET    Component Value Date/Time   NA 140 09/06/2019 0840   K 4.4 09/06/2019 0840   CL 101 09/06/2019 0840   CO2 24 09/06/2019 0840   GLUCOSE 88  09/06/2019 0840   GLUCOSE 105 (H) 06/30/2019 1625   BUN 15 09/06/2019 0840   CREATININE 0.74 09/06/2019 0840   CREATININE 0.70 10/21/2012 1145   CALCIUM 9.5 09/06/2019 0840   GFRNONAA 93 09/06/2019 0840   GFRAA 108 09/06/2019 0840   Lab Results  Component Value Date   HGBA1C 5.6 09/06/2019   HGBA1C 5.7 (H) 05/04/2019   Lab Results  Component Value Date   INSULIN 14.6 09/06/2019   CBC    Component Value Date/Time   WBC 8.1 09/06/2019 0840   WBC 8.2 06/30/2019 1625   RBC 3.84 09/06/2019 0840   RBC 4.13 06/30/2019 1625   HGB 11.0 (L) 09/06/2019 0840   HCT 34.0 09/06/2019 0840   PLT 406 (H) 06/30/2019 1625   PLT 140 (L) 05/04/2019 1035   MCV 89 09/06/2019 0840   MCH 28.6 09/06/2019 0840   MCH 28.3 06/30/2019 1625   MCHC 32.4 09/06/2019 0840   MCHC 31.7 06/30/2019 1625   RDW 14.5 09/06/2019 0840   LYMPHSABS 2.8 09/06/2019 0840   MONOABS 0.4 06/30/2019 1625   EOSABS 0.2 09/06/2019 0840   BASOSABS 0.0 09/06/2019 0840   Iron/TIBC/Ferritin/ %Sat    Component Value Date/Time   IRON 75 10/21/2012 1145   FERRITIN 36 10/21/2012 1145   Lipid Panel     Component Value Date/Time   CHOL 181 09/06/2019 0840   TRIG 71 09/06/2019 0840   HDL 47 09/06/2019 0840   LDLCALC 121 (H) 09/06/2019 0840   Hepatic Function Panel     Component Value Date/Time   PROT 7.4 09/06/2019 0840   ALBUMIN 4.3 09/06/2019 0840   AST 14 09/06/2019 0840   ALT 13 09/06/2019 0840   ALKPHOS 88 09/06/2019 0840   BILITOT 0.7 09/06/2019 0840      Component Value Date/Time   TSH 2.250 09/06/2019 0840   TSH 3.410 05/04/2019 1035   TSH 1.892 10/21/2012 1145     Ref. Range 09/06/2019 08:40  Vitamin D, 25-Hydroxy Latest Ref Range: 30.0 - 100.0 ng/mL 32.3    OBESITY BEHAVIORAL INTERVENTION VISIT  Today's visit was # 3   Starting weight: 256 lbs Starting date: 09/06/2019 Today's weight : 249 lbs Today's date: 10/04/2019  Total lbs lost to date: 7    10/04/2019  Height 5\' 3"  (1.6 m)  Weight  249 lb (112.9 kg)  BMI (Calculated) 44.12  BLOOD PRESSURE - SYSTOLIC [id number]  BLOOD PRESSURE - DIASTOLIC 88   Body Fat % A999333 %  Total Body Water (lbs) 90.2 lbs     ASK: We discussed the diagnosis of obesity with Vanessa Copeland today and Vanessa Copeland agreed to give [place] permission to discuss obesity behavioral modification therapy today.  ASSESS: Khani has the diagnosis of obesity and her BMI today is 44.12 Vanessa Copeland is in the action stage of change   ADVISE: Vanessa Copeland was educated on the multiple health risks of obesity as well as the benefit of weight loss to improve her health. She was advised of the need for long term treatment and the importance of lifestyle modifications to improve her current health and to decrease her risk of future health problems.  AGREE: Multiple dietary modification options and treatment options were discussed and  Vanessa Copeland agreed to follow the recommendations documented in the above note.  ARRANGE: Vanessa Copeland was educated on the importance of frequent visits to treat obesity as outlined per CMS and USPSTF guidelines and agreed to schedule her next follow up appointment today.  I, Doreene Nest, am acting as transcriptionist for [employer], FNP-C  I have reviewed the above documentation for accuracy and completeness, and I agree with the above.  - Simcha Speir, FNP-C.

## 2019-10-11 ENCOUNTER — Encounter (INDEPENDENT_AMBULATORY_CARE_PROVIDER_SITE_OTHER): Payer: Self-pay | Admitting: Family Medicine

## 2019-10-11 DIAGNOSIS — Z6841 Body Mass Index (BMI) 40.0 and over, adult: Secondary | ICD-10-CM | POA: Insufficient documentation

## 2019-10-11 DIAGNOSIS — R7303 Prediabetes: Secondary | ICD-10-CM | POA: Insufficient documentation

## 2019-10-17 ENCOUNTER — Ambulatory Visit (INDEPENDENT_AMBULATORY_CARE_PROVIDER_SITE_OTHER): Payer: BC Managed Care – PPO | Admitting: Family Medicine

## 2019-10-17 ENCOUNTER — Other Ambulatory Visit (INDEPENDENT_AMBULATORY_CARE_PROVIDER_SITE_OTHER): Payer: Self-pay | Admitting: Family Medicine

## 2019-10-17 DIAGNOSIS — E559 Vitamin D deficiency, unspecified: Secondary | ICD-10-CM

## 2019-10-18 ENCOUNTER — Encounter (INDEPENDENT_AMBULATORY_CARE_PROVIDER_SITE_OTHER): Payer: Self-pay

## 2019-10-18 NOTE — Progress Notes (Unsigned)
Office: [phone]  /  Fax: [phone]    Date: October 24, 2019   Appointment Start Time: *** Duration: *** minutes Provider: Glennie Isle, Psy.D. Type of Session: Individual Therapy  Location of Patient: {gbptloc:23249} Location of Provider: {Location of Service:22491} Type of Contact: Telepsychological Visit via [employer]   Session Content: Vanessa Copeland is a 53 y.o. female presenting via [place] for a follow-up appointment to address the previously established treatment goal of decreasing emotional eating. Today's appointment was a telepsychological visit, as it is an option for appointments to reduce exposure to COVID-19. Vanessa Copeland expressed understanding regarding the rationale for telepsychological services, and provided verbal consent for today's appointment. Prior to proceeding with today's appointment, Vanessa Copeland's physical location at the time of this appointment was obtained. In the event of technical difficulties, [name] shared a phone number she could be reached at. [name] and this provider participated in today's telepsychological service. Also, [name] denied anyone else being present in the room or on the WebEx appointment ***.  This provider conducted a brief check-in and verbally administered the PHQ-9 and GAD-7. *** Vanessa Copeland was receptive to today's session as evidenced by openness to sharing, responsiveness to feedback, and ***.  Mental Status Examination:  Appearance: {Appearance:22431} Behavior: {Behavior:22445} Mood: {gbmood:21757} Affect: {Affect:22436} Speech: {Speech:22432} Eye Contact: {Eye Contact:22433} Psychomotor Activity: {Motor Activity:22434} Thought Process: {thought process:22448}  Content/Perceptual Disturbances: {disturbances:22451} Orientation: {Orientation:22437} Cognition/Sensorium: {gbcognition:22449} Insight: {Insight:22446} Judgment: {Insight:22446}  Structured Assessment Results: The Patient Health Questionnaire-9 (PHQ-9) is a self-report  measure that assesses symptoms and severity of depression over the course of the last two weeks. Vanessa Copeland obtained a score of *** suggesting {GBPHQ9SEVERITY:21752}. Vanessa Copeland finds the endorsed symptoms to be {gbphq9difficulty:21754}. Little interest or pleasure in doing things ***  Feeling down, depressed, or hopeless ***  Trouble falling or staying asleep, or sleeping too much ***  Feeling tired or having little energy ***  Poor appetite or overeating ***  Feeling bad about yourself --- or that you are a failure or have let yourself or your family down ***  Trouble concentrating on things, such as reading the newspaper or watching television ***  Moving or speaking so slowly that other people could have noticed? Or the opposite --- being so fidgety or restless that you have been moving around a lot more than usual ***  Thoughts that you would be better off dead or hurting yourself in some way ***  PHQ-9 Score ***    The Generalized Anxiety Disorder-7 (GAD-7) is a brief self-report measure that assesses symptoms of anxiety over the course of the last two weeks. [name] obtained a score of *** suggesting {gbgad7severity:21753}. Vanessa Copeland finds the endorsed symptoms to be {gbphq9difficulty:21754}. Feeling nervous, anxious, on edge ***  Not being able to stop or control worrying ***  Worrying too much about different things ***  Trouble relaxing ***  Being so restless that it's hard to sit still ***  Becoming easily annoyed or irritable ***  Feeling afraid as if something awful might happen ***  GAD-7 Score ***   Interventions:  {Interventions:22172}  DSM-5 Diagnosis: 300.09 (F41.8) Other Specified Anxiety Disorder, Emotional Eating Behaviors  Treatment Goal & Progress: During the initial appointment with this provider, the following treatment goal was established: decrease emotional eating. Vanessa Copeland has demonstrated progress in her goal as evidenced by {gbtxprogress:22839}. Vanessa Copeland also reported  {gbtxprogress2:22951}.  Plan: Vanessa Copeland continues to appear able and willing to participate as evidenced by engagement in reciprocal conversation, and asking questions for clarification as appropriate. The next appointment  will be scheduled in {gbweeks:21758}, which will be via [employer]. The next session will focus on reviewing learned skills, and working towards the established treatment goal.***

## 2019-10-20 ENCOUNTER — Other Ambulatory Visit: Payer: Self-pay

## 2019-10-20 ENCOUNTER — Encounter (INDEPENDENT_AMBULATORY_CARE_PROVIDER_SITE_OTHER): Payer: Self-pay | Admitting: Family Medicine

## 2019-10-20 ENCOUNTER — Ambulatory Visit (INDEPENDENT_AMBULATORY_CARE_PROVIDER_SITE_OTHER): Payer: BC Managed Care – PPO | Admitting: Family Medicine

## 2019-10-20 VITALS — BP 153/84 | HR 80 | Temp 97.9°F | Ht 63.0 in | Wt 245.0 lb

## 2019-10-20 DIAGNOSIS — R7303 Prediabetes: Secondary | ICD-10-CM

## 2019-10-20 DIAGNOSIS — E559 Vitamin D deficiency, unspecified: Secondary | ICD-10-CM | POA: Diagnosis not present

## 2019-10-20 DIAGNOSIS — Z9189 Other specified personal risk factors, not elsewhere classified: Secondary | ICD-10-CM

## 2019-10-20 DIAGNOSIS — Z6841 Body Mass Index (BMI) 40.0 and over, adult: Secondary | ICD-10-CM

## 2019-10-20 MED ORDER — METFORMIN HCL 500 MG PO TABS: 500.0000 mg | ORAL_TABLET | Freq: Every day | ORAL | 0 refills | Status: DC

## 2019-10-20 MED ORDER — VITAMIN D (ERGOCALCIFEROL) 1.25 MG (50000 UNIT) PO CAPS: 50000.0000 [IU] | ORAL_CAPSULE | ORAL | 0 refills | Status: DC

## 2019-10-20 NOTE — Progress Notes (Signed)
Office: [phone]  /  Fax: [phone]   HPI:   Chief Complaint: OBESITY Vanessa Copeland is here to discuss her progress with her obesity treatment plan. She is on the  follow the Category 3 plan and is following her eating plan approximately 90 % of the time. She states she is exercising by walking for 25 minutes 5-6 times per week. Vanessa Copeland continues to do well with weight loss on her plan. Her hunger is controlled and she has done well with walking for exercise.  Her weight is 245 lb (111.1 kg) today and has had a weight loss of 4 pounds over a period of 2 weeks since her last visit. She has lost 11 lbs since starting treatment with [place].  Pre-Diabetes Vanessa Copeland has a diagnosis of prediabetes based on her elevated HgA1c and was informed this puts her at greater risk of developing diabetes. She is taking metformin at a 1/2 pill due to GI upset and notes decrease in polyphagia. She continues to work on diet and exercise to decrease risk of diabetes. She denies nausea or hypoglycemia.  At risk for diabetes Vanessa Copeland is at higher than averagerisk for developing diabetes due to her obesity. She currently denies polyuria or polydipsia.  Vitamin D deficiency Vanessa Copeland has a diagnosis of vitamin D deficiency. She is currently taking vit D and denies nausea, vomiting or muscle weakness.  ASSESSMENT AND PLAN:  Vitamin D deficiency - Plan: Vitamin D, Ergocalciferol, (DRISDOL) 1.25 MG (50000 UT) CAPS capsule  Prediabetes - Plan: metFORMIN (GLUCOPHAGE) 500 MG tablet  At risk for diabetes mellitus  Class 3 severe obesity with serious comorbidity and body mass index (BMI) of 40.0 to 44.9 in adult, unspecified obesity type ([place])  PLAN: Pre-Diabetes Vanessa Copeland will continue to work on weight loss, exercise, and decreasing simple carbohydrates in her diet to help decrease the risk of diabetes. We dicussed metformin including benefits and risks. She was informed that eating too many simple carbohydrates or too many  calories at one sitting increases the likelihood of GI side effects. Vanessa Copeland agreed to continue Metformin 500 mg qd #30 with no refills. Vanessa Copeland agreed to follow up with [place] as directed to monitor her progress.  Diabetes risk counseling Vanessa Copeland was given extended (15 minutes) diabetes prevention counseling today. She is 53 y.o. female and has risk factors for diabetes including obesity. We discussed intensive lifestyle modifications today with an emphasis on weight loss as well as increasing exercise and decreasing simple carbohydrates in her diet.  Vitamin D Deficiency Vanessa Copeland was informed that low vitamin D levels contributes to fatigue and are associated with obesity, breast, and colon cancer. She agrees to continue to take prescription Vit D @50 ,000 IU every week #4 with no refills and will follow up for routine testing of vitamin D, at least 2-3 times per year. She was informed of the risk of over-replacement of vitamin D and agrees to not increase her dose unless she discusses this with [place] first. Agrees to follow up with our clinic as directed.   Obesity Vanessa Copeland is currently in the action stage of change. As such, her goal is to continue with weight loss efforts She has agreed to follow the Category 3 plan Vanessa Copeland has been instructed to work up to a goal of 150 minutes of combined cardio and strengthening exercise per week for weight loss and overall health benefits. We discussed the following Behavioral Modification Strategies today: increasing lean protein intake and decreasing simple carbohydrates    Vanessa Copeland has agreed to  follow up with our clinic in 2-3 weeks. She was informed of the importance of frequent follow up visits to maximize her success with intensive lifestyle modifications for her multiple health conditions.  ALLERGIES: Allergies  Allergen Reactions  . Shellfish Allergy Shortness Of Breath    MEDICATIONS: Current Outpatient Medications on File Prior to Visit  Medication Sig  Dispense Refill  . albuterol (VENTOLIN HFA) 108 (90 Base) MCG/ACT inhaler Inhale 2 puffs into the lungs every 6 (six) hours as needed for wheezing or shortness of breath. 1 Inhaler 0  . EPINEPHrine 0.3 mg/0.3 mL IJ SOAJ injection Inject 0.3 mg into the muscle as needed for anaphylaxis.    Marland Kitchen ibuprofen (ADVIL) 800 MG tablet TAKE 1 TABLET(800 MG) BY MOUTH THREE TIMES DAILY 30 tablet 1  . levocetirizine (XYZAL) 5 MG tablet Take 1 tablet (5 mg total) by mouth every evening. 30 tablet 2   No current facility-administered medications on file prior to visit.     PAST MEDICAL HISTORY: Past Medical History:  Diagnosis Date  . Allergy   . Anemia   . Anxiety   . Asthma    mild intermittent(when allergies are flaring)  . Complication of anesthesia   . Food allergy    Shellfish  . H/O echocardiogram 12/2005   Trace MR, mild TR, nl L ventricular systolic function.  Marland Kitchen Heart murmur    since birth. Never has had any problems  . History of kidney stones   . Kidney stones   . Morbid obesity ([place]) 04/29/2019  . Obesity, unspecified   . PONV (postoperative nausea and vomiting)   . Vitamin D deficiency 01/2010    PAST SURGICAL HISTORY: Past Surgical History:  Procedure Laterality Date  . CESAREAN SECTION     x3  . CYSTOSCOPY WITH RETROGRADE PYELOGRAM, URETEROSCOPY AND STENT PLACEMENT Left 07/16/2018   Procedure: CYSTOSCOPY WITH RETROGRADE PYELOGRAM, URETEROSCOPY AND STENT PLACEMENT;  Surgeon: Cleon Gustin, MD;  Location: [facility];  Service: Urology;  Laterality: Left;  . EXTRACORPOREAL SHOCK WAVE LITHOTRIPSY Left 06/21/2018   Procedure: LEFT EXTRACORPOREAL SHOCK WAVE LITHOTRIPSY (ESWL);  Surgeon: Franchot Gallo, MD;  Location: [facility];  Service: Urology;  Laterality: Left;  . HOLMIUM LASER APPLICATION Left [id number]   Procedure: HOLMIUM LASER APPLICATION;  Surgeon: Cleon Gustin, MD;  Location: [facility];  Service: Urology;  Laterality: Left;  . OOPHORECTOMY  2002   left due to cyst  .  TUBAL LIGATION  1996    SOCIAL HISTORY: Social History   Tobacco Use  . Smoking status: Never Smoker  . Smokeless tobacco: Never Used  Substance Use Topics  . Alcohol use: No  . Drug use: No    FAMILY HISTORY: Family History  Problem Relation Age of Onset  . Hypertension Mother   . Anemia Mother   . Colon polyps Mother   . Anxiety disorder Mother   . Cancer Father        lung  . Kidney Stones Father   . Hypertension Father   . Lung cancer Father 58  . Asthma Brother   . Asthma Daughter   . Allergies Daughter   . Asthma Daughter   . Hypertension Brother   . Breast cancer Neg Hx   . Colon cancer Neg Hx   . Diabetes Neg Hx   . Heart disease Neg Hx     ROS: Review of Systems  Constitutional: Positive for weight loss.  Gastrointestinal: Negative for nausea and vomiting.  Musculoskeletal:  Negative for muscle weakness  Endo/Heme/Allergies: Negative for polydipsia.       Negative for hypoglycemia  Negative for polyuria     PHYSICAL EXAM: Blood pressure (!) 153/84, pulse 80, temperature 97.9 F (36.6 C), temperature source Oral, height 5\' 3"  (1.6 m), weight 245 lb (111.1 kg), SpO2 98 %. Body mass index is 43.4 kg/m. Physical Exam Vitals signs reviewed.  Constitutional:      Appearance: Normal appearance. She is obese.  HENT:     Head: Normocephalic.     Nose: Nose normal.  Neck:     Musculoskeletal: Normal range of motion.  Cardiovascular:     Rate and Rhythm: Normal rate.  Pulmonary:     Effort: Pulmonary effort is normal.  Musculoskeletal: Normal range of motion.  Skin:    General: Skin is warm and dry.  Neurological:     Mental Status: She is alert and oriented to person, place, and time.  Psychiatric:        Mood and Affect: Mood normal.        Behavior: Behavior normal.     RECENT LABS AND TESTS: BMET    Component Value Date/Time   NA 140 09/06/2019 0840   K 4.4 09/06/2019 0840   CL 101 09/06/2019 0840   CO2 24 09/06/2019 0840    GLUCOSE 88 09/06/2019 0840   GLUCOSE 105 (H) 06/30/2019 1625   BUN 15 09/06/2019 0840   CREATININE 0.74 09/06/2019 0840   CREATININE 0.70 10/21/2012 1145   CALCIUM 9.5 09/06/2019 0840   GFRNONAA 93 09/06/2019 0840   GFRAA 108 09/06/2019 0840   Lab Results  Component Value Date   HGBA1C 5.6 09/06/2019   HGBA1C 5.7 (H) 05/04/2019   Lab Results  Component Value Date   INSULIN 14.6 09/06/2019   CBC    Component Value Date/Time   WBC 8.1 09/06/2019 0840   WBC 8.2 06/30/2019 1625   RBC 3.84 09/06/2019 0840   RBC 4.13 06/30/2019 1625   HGB 11.0 (L) 09/06/2019 0840   HCT 34.0 09/06/2019 0840   PLT 406 (H) 06/30/2019 1625   PLT 140 (L) 05/04/2019 1035   MCV 89 09/06/2019 0840   MCH 28.6 09/06/2019 0840   MCH 28.3 06/30/2019 1625   MCHC 32.4 09/06/2019 0840   MCHC 31.7 06/30/2019 1625   RDW 14.5 09/06/2019 0840   LYMPHSABS 2.8 09/06/2019 0840   MONOABS 0.4 06/30/2019 1625   EOSABS 0.2 09/06/2019 0840   BASOSABS 0.0 09/06/2019 0840   Iron/TIBC/Ferritin/ %Sat    Component Value Date/Time   IRON 75 10/21/2012 1145   FERRITIN 36 10/21/2012 1145   Lipid Panel     Component Value Date/Time   CHOL 181 09/06/2019 0840   TRIG 71 09/06/2019 0840   HDL 47 09/06/2019 0840   LDLCALC 121 (H) 09/06/2019 0840   Hepatic Function Panel     Component Value Date/Time   PROT 7.4 09/06/2019 0840   ALBUMIN 4.3 09/06/2019 0840   AST 14 09/06/2019 0840   ALT 13 09/06/2019 0840   ALKPHOS 88 09/06/2019 0840   BILITOT 0.7 09/06/2019 0840      Component Value Date/Time   TSH 2.250 09/06/2019 0840   TSH 3.410 05/04/2019 1035   TSH 1.892 10/21/2012 1145     Ref. Range 09/06/2019 08:40  Vitamin D, 25-Hydroxy Latest Ref Range: 30.0 - 100.0 ng/mL 32.3     OBESITY BEHAVIORAL INTERVENTION VISIT  Today's visit was # 4   Starting weight: 256 lbs Starting  date: 09/06/19 Today's weight : Weight: 245 lb (111.1 kg)  Today's date: 10/20/2019 Total lbs lost to date: 11 lbs At least 15  minutes were spent on discussing the following behavioral intervention visit.   ASK: We discussed the diagnosis of obesity with [name] today and Tamyra agreed to give [place] permission to discuss obesity behavioral modification therapy today.  ASSESS: Haunani has the diagnosis of obesity and her BMI today is 43.41 [place] is in the action stage of change   ADVISE: Yakita was educated on the multiple health risks of obesity as well as the benefit of weight loss to improve her health. She was advised of the need for long term treatment and the importance of lifestyle modifications to improve her current health and to decrease her risk of future health problems.  AGREE: Multiple dietary modification options and treatment options were discussed and  Laprecious agreed to follow the recommendations documented in the above note.  ARRANGE: Ayaan was educated on the importance of frequent visits to treat obesity as outlined per CMS and USPSTF guidelines and agreed to schedule her next follow up appointment today.  I, Renee Ramus, am acting as transcriptionist for Dennard Nip, MD  I have reviewed the above documentation for accuracy and completeness, and I agree with the above. -Dennard Nip, MD

## 2019-10-24 ENCOUNTER — Ambulatory Visit (INDEPENDENT_AMBULATORY_CARE_PROVIDER_SITE_OTHER): Payer: Self-pay | Admitting: Psychology

## 2019-10-27 ENCOUNTER — Other Ambulatory Visit: Payer: Self-pay

## 2019-10-27 ENCOUNTER — Encounter: Payer: Self-pay | Admitting: Family Medicine

## 2019-10-27 ENCOUNTER — Ambulatory Visit (INDEPENDENT_AMBULATORY_CARE_PROVIDER_SITE_OTHER): Payer: BC Managed Care – PPO | Admitting: Family Medicine

## 2019-10-27 VITALS — Temp 97.2°F | Ht 63.0 in | Wt 244.0 lb

## 2019-10-27 DIAGNOSIS — J309 Allergic rhinitis, unspecified: Secondary | ICD-10-CM

## 2019-10-27 DIAGNOSIS — J029 Acute pharyngitis, unspecified: Secondary | ICD-10-CM

## 2019-10-27 NOTE — Progress Notes (Signed)
Start time: 1:30 End time: 1:49  Virtual Visit via Video Note  I connected with Vanessa Copeland on 10/27/19 at  1:30 PM EST by a video enabled telemedicine application and verified that I am speaking with the correct person using two identifiers.  Location: Patient: in car, with her grandson Provider: office  Lost video (froze and lost sound) multiple times during visit related to incoming phone calls from her daughter.  Switched to phone call for the last part of visit to avoid interruption of communication.   I discussed the limitations of evaluation and management by telemedicine and the availability of in person appointments. The patient expressed understanding and agreed to proceed.  History of Present Illness:  Chief Complaint  Patient presents with  . Sore Throat    VIRTUAL sore throat and itchy ears. Started Tuesday.    She is complaining of a scratchy throat x 2 days. Throat feels raw, complains of postnasal drainage.  It has been worse at night, except for today.  Also has itchy ears.  No runny nose, sniffle, sneezing, sinus pain.  Denies ear pain/popping/plugging. Not having much cough, just occasional. Tylenol helped a little this morning. Was going to take ibuprofen (has rx for 800mg ), but ran out.   She has h/o allergies and asthma.  She uses Xyzal as needed.  She took one today, hasn't otherwise been taking it much. Denies shortness of breath, or wheezing.  Tried dose of albuterol once, with no effect.  Granddaughter had a cold recently (at tail end of it now), no other sick contacts  PMH, PSH, SH reviewed  Outpatient Encounter Medications as of 10/27/2019  Medication Sig Note  . acetaminophen (TYLENOL) 500 MG tablet Take 1,000 mg by mouth every 6 (six) hours as needed. 10/27/2019: Last dose 9am  . albuterol (VENTOLIN HFA) 108 (90 Base) MCG/ACT inhaler Inhale 2 puffs into the lungs every 6 (six) hours as needed for wheezing or shortness of breath.   . levocetirizine  (XYZAL) 5 MG tablet Take 1 tablet (5 mg total) by mouth every evening. 10/27/2019: Took one today, uses prn  . metFORMIN (GLUCOPHAGE) 500 MG tablet Take 1 tablet (500 mg total) by mouth daily with breakfast. 10/27/2019: Taking 1/2 tablet daily  . Vitamin D, Ergocalciferol, (DRISDOL) 1.25 MG (50000 UT) CAPS capsule Take 1 capsule (50,000 Units total) by mouth every 7 (seven) days.   Marland Kitchen EPINEPHrine 0.3 mg/0.3 mL IJ SOAJ injection Inject 0.3 mg into the muscle as needed for anaphylaxis.   Marland Kitchen ibuprofen (ADVIL) 800 MG tablet TAKE 1 TABLET(800 MG) BY MOUTH THREE TIMES DAILY (Patient not taking: Reported on 10/27/2019)    No facility-administered encounter medications on file as of 10/27/2019.    Allergies  Allergen Reactions  . Shellfish Allergy Shortness Of Breath   ROS:  Metformin dose was decreased due to GI side effects, now resolved. No fever, chills nausea, vomiting, diarrhea, rashes.  No headaches, bleeding, bruising.  No loss of smell or taste. No shortness of breath or chest pain.   Observations/Objective:  Temp (!) 97.2 F (36.2 C) (Temporal) Comment: at school at 8am this morning  Ht 5\' 3"  (1.6 m)   Wt 244 lb (110.7 kg)   BMI 43.22 kg/m   Well-appearing, pleasant female, in good spirits, in no distress She is alert, oriented, sitting in car with grandson in the back. She is not sniffling, no throat clearing or coughing during visit. She is speaking easily, in no distress Normal mood, affect, grooming  Exam is limited due to virtual nature of the visit.   Assessment and Plan:  Sore throat  Allergic rhinitis, unspecified seasonality, unspecified trigger  Asking for refill for ibuprofen--checked chart, and she reports she never picked up the refill that was sent with her 07/2019 rx.  She will call her pharmacy.   Take your xyzal every day. Try salt water gargles and/or chloraseptic spray to help with your sore throat. You should continue to use tylenol and/or ibuprofen if needed  for pain.  If taking ibuprofen, be sure to take it with food. You may use a decongestant such as sudafed if you have increased congestion, sinus pain, drainage.  If you develop fever, discolored mucus or phlegm, sinus pain, worsening cough, pain with breathing or shortness of breath, please contact us again.    Follow Up Instructions:    I discussed the assessment and treatment plan with the patient. The patient was provided an opportunity to ask questions and all were answered. The patient agreed with the plan and demonstrated an understanding of the instructions.   The patient was advised to call back or seek an in-person evaluation if the symptoms worsen or if the condition fails to improve as anticipated.  I provided 19 minutes of non-face-to-face time during this encounter.   Vikki Ports, MD

## 2019-10-27 NOTE — Patient Instructions (Signed)
  Take your xyzal every day. Try salt water gargles and/or chloraseptic spray to help with your sore throat. You should continue to use tylenol and/or ibuprofen if needed for pain.  If taking ibuprofen, be sure to take it with food. You may use a decongestant such as sudafed if you have increased congestion, sinus pain, drainage.  If you develop fever, discolored mucus or phlegm, sinus pain, worsening cough, pain with breathing or shortness of breath, please contact us again.

## 2019-10-27 NOTE — Progress Notes (Signed)
Excellent.  Thank you 

## 2019-11-02 ENCOUNTER — Other Ambulatory Visit: Payer: Self-pay

## 2019-11-02 DIAGNOSIS — Z20822 Contact with and (suspected) exposure to covid-19: Secondary | ICD-10-CM

## 2019-11-05 LAB — NOVEL CORONAVIRUS, NAA: SARS-CoV-2, NAA: DETECTED — AB

## 2019-11-09 ENCOUNTER — Other Ambulatory Visit: Payer: Self-pay

## 2019-11-09 ENCOUNTER — Telehealth (INDEPENDENT_AMBULATORY_CARE_PROVIDER_SITE_OTHER): Payer: BC Managed Care – PPO | Admitting: Family Medicine

## 2019-11-09 ENCOUNTER — Encounter (INDEPENDENT_AMBULATORY_CARE_PROVIDER_SITE_OTHER): Payer: Self-pay | Admitting: Family Medicine

## 2019-11-09 DIAGNOSIS — Z6841 Body Mass Index (BMI) 40.0 and over, adult: Secondary | ICD-10-CM

## 2019-11-09 DIAGNOSIS — E559 Vitamin D deficiency, unspecified: Secondary | ICD-10-CM

## 2019-11-09 MED ORDER — VITAMIN D (ERGOCALCIFEROL) 1.25 MG (50000 UNIT) PO CAPS: 50000.0000 [IU] | ORAL_CAPSULE | ORAL | 0 refills | Status: DC

## 2019-11-10 ENCOUNTER — Ambulatory Visit (INDEPENDENT_AMBULATORY_CARE_PROVIDER_SITE_OTHER): Payer: BC Managed Care – PPO | Admitting: Family Medicine

## 2019-11-14 NOTE — Progress Notes (Signed)
Office: [phone]  /  Fax: [phone] TeleHealth Visit:  Vanessa Copeland has verbally consented to this TeleHealth visit today. The patient is located at home, the provider is located at the [facility] office. The participants in this visit include the listed provider and patient. The visit was conducted today via [email or web address].  HPI:   Chief Complaint: OBESITY Vanessa Copeland is here to discuss her progress with her obesity treatment plan. She is on the Category 3 plan and is following her eating plan approximately 70 % of the time. She states she is walking for 25 minutes 5 times per week. Vanessa Copeland has been sick with COVID19 over the last 2 weeks. She is feeling much better now, but hadn't been eating much food while having symptoms. She has questions about Thanksgiving plans. She is getting bored for breakfast and would like more options. She states her weight is 237 lbs today. We were unable to weigh the patient today for this TeleHealth visit. She feels as if she has lost weight since her last visit. She has lost 11 lbs since starting treatment with [place].  Vitamin D Deficiency Vanessa Copeland has a diagnosis of vitamin D deficiency. She is stable on prescription Vit D, but level is not yet at goal. She denies nausea, vomiting or muscle weakness. She requests a refill today.  ASSESSMENT AND PLAN:  Vitamin D deficiency - Plan: Vitamin D, Ergocalciferol, (DRISDOL) 1.25 MG (50000 UT) CAPS capsule  Class 3 severe obesity with serious comorbidity and body mass index (BMI) of 40.0 to 44.9 in adult, unspecified obesity type ([place])  PLAN:  Vitamin D Deficiency Vanessa Copeland was informed that low vitamin D levels contributes to fatigue and are associated with obesity, breast, and colon cancer. Vanessa Copeland agrees to continue taking prescription Vit D 50,000 IU every week #4 and we will refill for 1 month. She will follow up for routine testing of vitamin D, at least 2-3 times per year. She was informed of the risk  of over-replacement of vitamin D and agrees to not increase her dose unless she discusses this with [place] first. We will recheck labs in 1-2 months. Vanessa Copeland agrees to follow up with our clinic in 2 to 3 weeks.  Obesity Vanessa Copeland is currently in the action stage of change. As such, her goal is to continue with weight loss efforts She has agreed to keep a food journal with 250-400 calories and 20+ grams of protein at breakfast daily and follow the Category 3 plan Vanessa Copeland has been instructed to work up to a goal of 150 minutes of combined cardio and strengthening exercise per week for weight loss and overall health benefits. We discussed the following Behavioral Modification Strategies today: increasing lean protein intake and holiday eating strategies  We will send protein food list. Vanessa Copeland has agreed to minimize contact over Thanksgiving.  Vanessa Copeland has agreed to follow up with our clinic in 2 to 3 weeks with Dr. Juleen China. She was informed of the importance of frequent follow up visits to maximize her success with intensive lifestyle modifications for her multiple health conditions.  ALLERGIES: Allergies  Allergen Reactions  . Shellfish Allergy Shortness Of Breath    MEDICATIONS: Current Outpatient Medications on File Prior to Visit  Medication Sig Dispense Refill  . acetaminophen (TYLENOL) 500 MG tablet Take 1,000 mg by mouth every 6 (six) hours as needed.    Marland Kitchen albuterol (VENTOLIN HFA) 108 (90 Base) MCG/ACT inhaler Inhale 2 puffs into the lungs every 6 (six) hours as  needed for wheezing or shortness of breath. 1 Inhaler 0  . EPINEPHrine 0.3 mg/0.3 mL IJ SOAJ injection Inject 0.3 mg into the muscle as needed for anaphylaxis.    Marland Kitchen ibuprofen (ADVIL) 800 MG tablet TAKE 1 TABLET(800 MG) BY MOUTH THREE TIMES DAILY 30 tablet 1  . levocetirizine (XYZAL) 5 MG tablet Take 1 tablet (5 mg total) by mouth every evening. 30 tablet 2  . metFORMIN (GLUCOPHAGE) 500 MG tablet Take 1 tablet (500 mg total) by mouth daily  with breakfast. 30 tablet 0   No current facility-administered medications on file prior to visit.     PAST MEDICAL HISTORY: Past Medical History:  Diagnosis Date  . Allergy   . Anemia   . Anxiety   . Asthma    mild intermittent(when allergies are flaring)  . Complication of anesthesia   . Food allergy    Shellfish  . H/O echocardiogram 12/2005   Trace MR, mild TR, nl L ventricular systolic function.  Marland Kitchen Heart murmur    since birth. Never has had any problems  . History of kidney stones   . Kidney stones   . Morbid obesity ([place]) 04/29/2019  . Obesity, unspecified   . PONV (postoperative nausea and vomiting)   . Vitamin D deficiency 01/2010    PAST SURGICAL HISTORY: Past Surgical History:  Procedure Laterality Date  . CESAREAN SECTION     x3  . CYSTOSCOPY WITH RETROGRADE PYELOGRAM, URETEROSCOPY AND STENT PLACEMENT Left 07/16/2018   Procedure: CYSTOSCOPY WITH RETROGRADE PYELOGRAM, URETEROSCOPY AND STENT PLACEMENT;  Surgeon: Cleon Gustin, MD;  Location: [facility];  Service: Urology;  Laterality: Left;  . EXTRACORPOREAL SHOCK WAVE LITHOTRIPSY Left 06/21/2018   Procedure: LEFT EXTRACORPOREAL SHOCK WAVE LITHOTRIPSY (ESWL);  Surgeon: Franchot Gallo, MD;  Location: [facility];  Service: Urology;  Laterality: Left;  . HOLMIUM LASER APPLICATION Left [id number]   Procedure: HOLMIUM LASER APPLICATION;  Surgeon: Cleon Gustin, MD;  Location: [facility];  Service: Urology;  Laterality: Left;  . OOPHORECTOMY  2002   left due to cyst  . TUBAL LIGATION  1996    SOCIAL HISTORY: Social History   Tobacco Use  . Smoking status: Never Smoker  . Smokeless tobacco: Never Used  Substance Use Topics  . Alcohol use: No  . Drug use: No    FAMILY HISTORY: Family History  Problem Relation Age of Onset  . Hypertension Mother   . Anemia Mother   . Colon polyps Mother   . Anxiety disorder Mother   . Cancer Father        lung  . Kidney Stones Father   . Hypertension Father   . Lung  cancer Father 48  . Asthma Brother   . Asthma Daughter   . Allergies Daughter   . Asthma Daughter   . Hypertension Brother   . Breast cancer Neg Hx   . Colon cancer Neg Hx   . Diabetes Neg Hx   . Heart disease Neg Hx     ROS: Review of Systems  Constitutional: Positive for weight loss.  Gastrointestinal: Negative for nausea and vomiting.  Musculoskeletal:       Negative muscle weakness    PHYSICAL EXAM: Pt in no acute distress  RECENT LABS AND TESTS: BMET    Component Value Date/Time   NA 140 09/06/2019 0840   K 4.4 09/06/2019 0840   CL 101 09/06/2019 0840   CO2 24 09/06/2019 0840   GLUCOSE 88 09/06/2019 0840   GLUCOSE 105 (  H) 06/30/2019 1625   BUN 15 09/06/2019 0840   CREATININE 0.74 09/06/2019 0840   CREATININE 0.70 10/21/2012 1145   CALCIUM 9.5 09/06/2019 0840   GFRNONAA 93 09/06/2019 0840   GFRAA 108 09/06/2019 0840   Lab Results  Component Value Date   HGBA1C 5.6 09/06/2019   HGBA1C 5.7 (H) 05/04/2019   Lab Results  Component Value Date   INSULIN 14.6 09/06/2019   CBC    Component Value Date/Time   WBC 8.1 09/06/2019 0840   WBC 8.2 06/30/2019 1625   RBC 3.84 09/06/2019 0840   RBC 4.13 06/30/2019 1625   HGB 11.0 (L) 09/06/2019 0840   HCT 34.0 09/06/2019 0840   PLT 406 (H) 06/30/2019 1625   PLT 140 (L) 05/04/2019 1035   MCV 89 09/06/2019 0840   MCH 28.6 09/06/2019 0840   MCH 28.3 06/30/2019 1625   MCHC 32.4 09/06/2019 0840   MCHC 31.7 06/30/2019 1625   RDW 14.5 09/06/2019 0840   LYMPHSABS 2.8 09/06/2019 0840   MONOABS 0.4 06/30/2019 1625   EOSABS 0.2 09/06/2019 0840   BASOSABS 0.0 09/06/2019 0840   Iron/TIBC/Ferritin/ %Sat    Component Value Date/Time   IRON 75 10/21/2012 1145   FERRITIN 36 10/21/2012 1145   Lipid Panel     Component Value Date/Time   CHOL 181 09/06/2019 0840   TRIG 71 09/06/2019 0840   HDL 47 09/06/2019 0840   LDLCALC 121 (H) 09/06/2019 0840   Hepatic Function Panel     Component Value Date/Time   PROT 7.4  09/06/2019 0840   ALBUMIN 4.3 09/06/2019 0840   AST 14 09/06/2019 0840   ALT 13 09/06/2019 0840   ALKPHOS 88 09/06/2019 0840   BILITOT 0.7 09/06/2019 0840      Component Value Date/Time   TSH 2.250 09/06/2019 0840   TSH 3.410 05/04/2019 1035   TSH 1.892 10/21/2012 1145      I, Trixie Dredge, am acting as Location manager for Dennard Nip, MD I have reviewed the above documentation for accuracy and completeness, and I agree with the above. -Dennard Nip, MD

## 2019-11-23 ENCOUNTER — Encounter (INDEPENDENT_AMBULATORY_CARE_PROVIDER_SITE_OTHER): Payer: Self-pay | Admitting: Family Medicine

## 2019-11-23 ENCOUNTER — Ambulatory Visit (INDEPENDENT_AMBULATORY_CARE_PROVIDER_SITE_OTHER): Payer: BC Managed Care – PPO | Admitting: Family Medicine

## 2019-11-23 ENCOUNTER — Other Ambulatory Visit: Payer: Self-pay

## 2019-11-23 VITALS — BP 137/75 | HR 70 | Temp 97.6°F | Ht 63.0 in | Wt 236.0 lb

## 2019-11-23 DIAGNOSIS — E559 Vitamin D deficiency, unspecified: Secondary | ICD-10-CM | POA: Diagnosis not present

## 2019-11-23 DIAGNOSIS — D508 Other iron deficiency anemias: Secondary | ICD-10-CM | POA: Diagnosis not present

## 2019-11-23 DIAGNOSIS — Z6841 Body Mass Index (BMI) 40.0 and over, adult: Secondary | ICD-10-CM

## 2019-11-23 DIAGNOSIS — E7849 Other hyperlipidemia: Secondary | ICD-10-CM | POA: Diagnosis not present

## 2019-11-23 DIAGNOSIS — Z9189 Other specified personal risk factors, not elsewhere classified: Secondary | ICD-10-CM

## 2019-11-23 DIAGNOSIS — R7303 Prediabetes: Secondary | ICD-10-CM | POA: Diagnosis not present

## 2019-11-23 MED ORDER — VITAMIN D (ERGOCALCIFEROL) 1.25 MG (50000 UNIT) PO CAPS: 50000.0000 [IU] | ORAL_CAPSULE | ORAL | 0 refills | Status: DC

## 2019-11-23 NOTE — Progress Notes (Signed)
Office: [phone]  /  Fax: [phone]   HPI:   Chief Complaint: OBESITY Vanessa Copeland is here to discuss her progress with her obesity treatment plan. She is on the keep a food journal with 250-400 calories and 20+ grams of protein at breakfast daily and follow the Category 3 plan and is following her eating plan approximately 75 % of the time. She states she is walking for 25 minutes 7 times per week. Vanessa Copeland had increased celebration eating for Thanksgiving, otherwise she was adhering to the diet. She sometimes misses breakfast.  Her weight is 236 lb (107 kg) today and has had a weight loss of 9 pounds over a period of 5 weeks since her last visit. She has lost 20 lbs since starting treatment with [place].  Vitamin D Deficiency Vanessa Copeland has a diagnosis of vitamin D deficiency. She is currently taking prescription Vit D weekly and denies nausea, vomiting or muscle weakness.  At risk for osteopenia and osteoporosis Vanessa Copeland is at higher risk of osteopenia and osteoporosis due to vitamin D deficiency.   Anemia Vanessa Copeland has a diagnosis of anemia. Last Hgb was 11.0 and MCV of 89. She is not on iron supplementation.   Pre-Diabetes Vanessa Copeland has a diagnosis of pre-diabetes based on her elevated Hgb A1c and was informed this puts her at greater risk of developing diabetes. She is taking metformin currently 500 mg PO daily (1/2 tablet). She continues to work on diet and exercise to decrease risk of diabetes.   Hyperlipidemia Vanessa Copeland has hyperlipidemia and has been trying to improve her cholesterol levels with intensive lifestyle modification including a low saturated fat diet, exercise and weight loss. She denies any chest pain, claudication or myalgias.  ASSESSMENT AND PLAN:  Vitamin D deficiency - Plan: Vitamin D, Ergocalciferol, (DRISDOL) 1.25 MG (50000 UT) CAPS capsule  Other iron deficiency anemia  Prediabetes  Other hyperlipidemia  At risk for osteoporosis  Class 3 severe obesity with serious  comorbidity and body mass index (BMI) of 40.0 to 44.9 in adult, unspecified obesity type ([place])  PLAN:  Vitamin D Deficiency Vanessa Copeland was informed that low vitamin D levels contributes to fatigue and are associated with obesity, breast, and colon cancer. Vanessa Copeland agrees to continue taking prescription Vit D 50,000 IU every week #4 and we will refill for 1 month. She will follow up for routine testing of vitamin D, at least 2-3 times per year. She was informed of the risk of over-replacement of vitamin D and agrees to not increase her dose unless she discusses this with [place] first. Vanessa Copeland agrees to follow up with our clinic in 2 weeks.  At risk for osteopenia and osteoporosis Vanessa Copeland was given extended (15 minutes) osteoporosis prevention counseling today. Vanessa Copeland is at risk for osteopenia and osteoporsis due to her vitamin D deficiency. She was encouraged to take her vitamin D and follow her higher calcium diet and increase strengthening exercise to help strengthen her bones and decrease her risk of osteopenia and osteoporosis.  Anemia The diagnosis of anemia was discussed with Vanessa Copeland and was explained in detail. She was given suggestions of iron rich foods and iron supplement was not prescribed. We will consider anemia panel at her next lab check in 4 weeks.  Pre-Diabetes Vanessa Copeland will continue to work on weight loss, exercise, and decreasing simple carbohydrates in her diet to help decrease the risk of diabetes. We dicussed metformin including benefits and risks. She was informed that eating too many simple carbohydrates or too many calories at one  sitting increases the likelihood of GI side effects. Vanessa Copeland agrees to continue taking metformin, and she agrees to follow up with our clinic in 2 weeks as directed to monitor her progress.  Hyperlipidemia Vanessa Copeland was informed of the American Heart Association Guidelines emphasizing intensive lifestyle modifications as the first line treatment for hyperlipidemia.  We discussed many lifestyle modifications today in depth, and Vanessa Copeland will continue to work on decreasing saturated fats such as fatty red meat, butter and many fried foods. She will also increase vegetables and lean protein in her diet and continue to work on exercise and weight loss efforts, and we will continue to monitor.  Obesity Vanessa Copeland is currently in the action stage of change. As such, her goal is to continue with weight loss efforts She has agreed to keep a food journal with 250-350 calories and 25+ grams of protein at breakfast daily and follow the Category 3 plan Vanessa Copeland has been instructed to work up to a goal of 150 minutes of combined cardio and strengthening exercise per week or as tolerated for weight loss and overall health benefits. We discussed the following Behavioral Modification Strategies today: increasing lean protein intake, decreasing simple carbohydrates, increasing vegetables, increase H20 intake, decreasing sodium intake, work on meal planning and easy cooking plans, keeping healthy foods in the home, and holiday eating strategies    Vanessa Copeland has agreed to follow up with our clinic in 2 weeks. She was informed of the importance of frequent follow up visits to maximize her success with intensive lifestyle modifications for her multiple health conditions.  ALLERGIES: Allergies  Allergen Reactions  . Shellfish Allergy Shortness Of Breath    MEDICATIONS: Current Outpatient Medications on File Prior to Visit  Medication Sig Dispense Refill  . acetaminophen (TYLENOL) 500 MG tablet Take 1,000 mg by mouth every 6 (six) hours as needed.    Marland Kitchen albuterol (VENTOLIN HFA) 108 (90 Base) MCG/ACT inhaler Inhale 2 puffs into the lungs every 6 (six) hours as needed for wheezing or shortness of breath. 1 Inhaler 0  . EPINEPHrine 0.3 mg/0.3 mL IJ SOAJ injection Inject 0.3 mg into the muscle as needed for anaphylaxis.    Marland Kitchen ibuprofen (ADVIL) 800 MG tablet TAKE 1 TABLET(800 MG) BY MOUTH THREE  TIMES DAILY 30 tablet 1  . levocetirizine (XYZAL) 5 MG tablet Take 1 tablet (5 mg total) by mouth every evening. 30 tablet 2  . metFORMIN (GLUCOPHAGE) 500 MG tablet Take 1 tablet (500 mg total) by mouth daily with breakfast. 30 tablet 0   No current facility-administered medications on file prior to visit.     PAST MEDICAL HISTORY: Past Medical History:  Diagnosis Date  . Allergy   . Anemia   . Anxiety   . Asthma    mild intermittent(when allergies are flaring)  . Complication of anesthesia   . Food allergy    Shellfish  . H/O echocardiogram 12/2005   Trace MR, mild TR, nl L ventricular systolic function.  Marland Kitchen Heart murmur    since birth. Never has had any problems  . History of kidney stones   . Kidney stones   . Morbid obesity ([place]) 04/29/2019  . Obesity, unspecified   . PONV (postoperative nausea and vomiting)   . Vitamin D deficiency 01/2010    PAST SURGICAL HISTORY: Past Surgical History:  Procedure Laterality Date  . CESAREAN SECTION     x3  . CYSTOSCOPY WITH RETROGRADE PYELOGRAM, URETEROSCOPY AND STENT PLACEMENT Left 07/16/2018   Procedure: CYSTOSCOPY WITH RETROGRADE PYELOGRAM,  URETEROSCOPY AND STENT PLACEMENT;  Surgeon: Cleon Gustin, MD;  Location: [facility];  Service: Urology;  Laterality: Left;  . EXTRACORPOREAL SHOCK WAVE LITHOTRIPSY Left 06/21/2018   Procedure: LEFT EXTRACORPOREAL SHOCK WAVE LITHOTRIPSY (ESWL);  Surgeon: Franchot Gallo, MD;  Location: [facility];  Service: Urology;  Laterality: Left;  . HOLMIUM LASER APPLICATION Left [id number]   Procedure: HOLMIUM LASER APPLICATION;  Surgeon: Cleon Gustin, MD;  Location: [facility];  Service: Urology;  Laterality: Left;  . OOPHORECTOMY  2002   left due to cyst  . TUBAL LIGATION  1996    SOCIAL HISTORY: Social History   Tobacco Use  . Smoking status: Never Smoker  . Smokeless tobacco: Never Used  Substance Use Topics  . Alcohol use: No  . Drug use: No    FAMILY HISTORY: Family History  Problem  Relation Age of Onset  . Hypertension Mother   . Anemia Mother   . Colon polyps Mother   . Anxiety disorder Mother   . Cancer Father        lung  . Kidney Stones Father   . Hypertension Father   . Lung cancer Father 44  . Asthma Brother   . Asthma Daughter   . Allergies Daughter   . Asthma Daughter   . Hypertension Brother   . Breast cancer Neg Hx   . Colon cancer Neg Hx   . Diabetes Neg Hx   . Heart disease Neg Hx     ROS: Review of Systems  Constitutional: Positive for weight loss.  Cardiovascular: Negative for chest pain and claudication.  Gastrointestinal: Negative for nausea and vomiting.  Musculoskeletal: Negative for myalgias.       Negative muscle weakness    PHYSICAL EXAM: Blood pressure 137/75, pulse 70, temperature 97.6 F (36.4 C), temperature source Oral, height 5\' 3"  (1.6 m), weight 236 lb (107 kg), SpO2 98 %. Body mass index is 41.81 kg/m. Physical Exam Vitals signs reviewed.  Constitutional:      Appearance: Normal appearance. She is obese.  Cardiovascular:     Rate and Rhythm: Normal rate.     Pulses: Normal pulses.  Pulmonary:     Effort: Pulmonary effort is normal.     Breath sounds: Normal breath sounds.  Musculoskeletal: Normal range of motion.  Skin:    General: Skin is warm and dry.  Neurological:     Mental Status: She is alert and oriented to person, place, and time.  Psychiatric:        Mood and Affect: Mood normal.        Behavior: Behavior normal.     RECENT LABS AND TESTS: BMET    Component Value Date/Time   NA 140 09/06/2019 0840   K 4.4 09/06/2019 0840   CL 101 09/06/2019 0840   CO2 24 09/06/2019 0840   GLUCOSE 88 09/06/2019 0840   GLUCOSE 105 (H) 06/30/2019 1625   BUN 15 09/06/2019 0840   CREATININE 0.74 09/06/2019 0840   CREATININE 0.70 10/21/2012 1145   CALCIUM 9.5 09/06/2019 0840   GFRNONAA 93 09/06/2019 0840   GFRAA 108 09/06/2019 0840   Lab Results  Component Value Date   HGBA1C 5.6 09/06/2019   HGBA1C  5.7 (H) 05/04/2019   Lab Results  Component Value Date   INSULIN 14.6 09/06/2019   CBC    Component Value Date/Time   WBC 8.1 09/06/2019 0840   WBC 8.2 06/30/2019 1625   RBC 3.84 09/06/2019 0840   RBC 4.13 06/30/2019 1625  HGB 11.0 (L) 09/06/2019 0840   HCT 34.0 09/06/2019 0840   PLT 406 (H) 06/30/2019 1625   PLT 140 (L) 05/04/2019 1035   MCV 89 09/06/2019 0840   MCH 28.6 09/06/2019 0840   MCH 28.3 06/30/2019 1625   MCHC 32.4 09/06/2019 0840   MCHC 31.7 06/30/2019 1625   RDW 14.5 09/06/2019 0840   LYMPHSABS 2.8 09/06/2019 0840   MONOABS 0.4 06/30/2019 1625   EOSABS 0.2 09/06/2019 0840   BASOSABS 0.0 09/06/2019 0840   Iron/TIBC/Ferritin/ %Sat    Component Value Date/Time   IRON 75 10/21/2012 1145   FERRITIN 36 10/21/2012 1145   Lipid Panel     Component Value Date/Time   CHOL 181 09/06/2019 0840   TRIG 71 09/06/2019 0840   HDL 47 09/06/2019 0840   LDLCALC 121 (H) 09/06/2019 0840   Hepatic Function Panel     Component Value Date/Time   PROT 7.4 09/06/2019 0840   ALBUMIN 4.3 09/06/2019 0840   AST 14 09/06/2019 0840   ALT 13 09/06/2019 0840   ALKPHOS 88 09/06/2019 0840   BILITOT 0.7 09/06/2019 0840      Component Value Date/Time   TSH 2.250 09/06/2019 0840   TSH 3.410 05/04/2019 1035   TSH 1.892 10/21/2012 1145      OBESITY BEHAVIORAL INTERVENTION VISIT  Today's visit was # 6   Starting weight: 256 lbs Starting date: 09/06/2019 Today's weight : 236 lbs Today's date: 11/23/2019 Total lbs lost to date: 30    ASK: We discussed the diagnosis of obesity with Vanessa Copeland today and Vanessa Copeland agreed to give [place] permission to discuss obesity behavioral modification therapy today.  ASSESS: Suzzane has the diagnosis of obesity and her BMI today is 41.82 Bertia is in the action stage of change   ADVISE: Vanessa Copeland was educated on the multiple health risks of obesity as well as the benefit of weight loss to improve her health. She was advised of the need for  long term treatment and the importance of lifestyle modifications to improve her current health and to decrease her risk of future health problems.  AGREE: Multiple dietary modification options and treatment options were discussed and  Vanessa Copeland agreed to follow the recommendations documented in the above note.  ARRANGE: Vanessa Copeland was educated on the importance of frequent visits to treat obesity as outlined per CMS and USPSTF guidelines and agreed to schedule her next follow up appointment today.  Vanessa Copeland, am acting as transcriptionist for Briscoe Deutscher, DO  I have reviewed the above documentation for accuracy and completeness, and I agree with the above. Briscoe Deutscher, DO

## 2019-11-24 ENCOUNTER — Encounter (INDEPENDENT_AMBULATORY_CARE_PROVIDER_SITE_OTHER): Payer: Self-pay | Admitting: Family Medicine

## 2019-12-07 ENCOUNTER — Ambulatory Visit (INDEPENDENT_AMBULATORY_CARE_PROVIDER_SITE_OTHER): Payer: BC Managed Care – PPO | Admitting: Family Medicine

## 2019-12-08 ENCOUNTER — Ambulatory Visit (INDEPENDENT_AMBULATORY_CARE_PROVIDER_SITE_OTHER): Payer: BC Managed Care – PPO | Admitting: Family Medicine

## 2019-12-08 ENCOUNTER — Encounter (INDEPENDENT_AMBULATORY_CARE_PROVIDER_SITE_OTHER): Payer: Self-pay | Admitting: Family Medicine

## 2019-12-08 ENCOUNTER — Other Ambulatory Visit: Payer: Self-pay

## 2019-12-08 VITALS — BP 128/80 | HR 59 | Temp 98.2°F | Ht 63.0 in | Wt 233.0 lb

## 2019-12-08 DIAGNOSIS — R7303 Prediabetes: Secondary | ICD-10-CM

## 2019-12-08 DIAGNOSIS — E7849 Other hyperlipidemia: Secondary | ICD-10-CM | POA: Diagnosis not present

## 2019-12-08 DIAGNOSIS — E559 Vitamin D deficiency, unspecified: Secondary | ICD-10-CM

## 2019-12-08 DIAGNOSIS — Z9189 Other specified personal risk factors, not elsewhere classified: Secondary | ICD-10-CM

## 2019-12-08 DIAGNOSIS — Z6841 Body Mass Index (BMI) 40.0 and over, adult: Secondary | ICD-10-CM

## 2019-12-08 DIAGNOSIS — D508 Other iron deficiency anemias: Secondary | ICD-10-CM

## 2019-12-08 MED ORDER — VITAMIN D (ERGOCALCIFEROL) 1.25 MG (50000 UNIT) PO CAPS: 50000.0000 [IU] | ORAL_CAPSULE | ORAL | 0 refills | Status: DC

## 2019-12-08 MED ORDER — METFORMIN HCL 500 MG PO TABS: 500.0000 mg | ORAL_TABLET | Freq: Every day | ORAL | 0 refills | Status: DC

## 2019-12-13 NOTE — Progress Notes (Signed)
Office: [phone]  /  Fax: [phone]   HPI:  Chief Complaint: OBESITY Vanessa Copeland is here to discuss her progress with her obesity treatment plan. She is on the keep a food journal with 250-350 calories and 25+ grams of protein at breakfast daily and follow the Category 3 plan and states she is following her eating plan approximately 80 % of the time. She states she is walking for 25 minutes 6 times per week.  [name] was able to adhere to her plan with protein and calorie guideline. She is walking almost daily. She is tolerating metformin now ( still at 1/2 tablet in the morning). She notes some polyphagia.  Today's visit was # 6  Starting weight: 256 lbs Starting date: 09/06/2019 Today's weight : 233 lbs Today's date: 12/08/2019 Total lbs lost to date: 23 Total lbs lost since last in-office visit: 3  Vitamin D Deficiency Vanessa Copeland has a diagnosis of vitamin D deficiency. She is taking prescription Vit D.  Iron Deficiency Anemia Vanessa Copeland has a diagnosis of anemia. She denies shortness or breath or fatigue.   Lab Results  Component Value Date   WBC 8.1 09/06/2019   HGB 11.0 (L) 09/06/2019   HCT 34.0 09/06/2019   MCV 89 09/06/2019   PLT 406 (H) 06/30/2019   Lab Results  Component Value Date   IRON 75 10/21/2012   FERRITIN 36 10/21/2012   Pre-Diabetes Vanessa Copeland has a diagnosis of pre-diabetes. She is taking metformin.  Lab Results  Component Value Date   HGBA1C 5.6 09/06/2019   At risk for diabetes Vanessa Copeland is at higher than average risk for developing diabetes due to her obesity and pre-diabetes.   Hyperlipidemia Vanessa Copeland has a diagnosis of hyperlipidemia. She is not on medications.   Lab Results  Component Value Date   CHOL 181 09/06/2019   HDL 47 09/06/2019   LDLCALC 121 (H) 09/06/2019   TRIG 71 09/06/2019   Lab Results  Component Value Date   ALT 13 09/06/2019   AST 14 09/06/2019   ALKPHOS 88 09/06/2019   BILITOT 0.7 09/06/2019   The 10-year ASCVD risk score  Vanessa Bussing DC Jr., et al., 2013) is: 2.6%   Values used to calculate the score:     Age: 69 years     Sex: Female     Is Non-Hispanic African American: Yes     Diabetic: No     Tobacco smoker: No     Systolic Blood Pressure: 0000000 mmHg     Is BP treated: No     HDL Cholesterol: 47 mg/dL     Total Cholesterol: 181 mg/dL  ASSESSMENT AND PLAN:  Vitamin D deficiency - Plan: Vitamin D, Ergocalciferol, (DRISDOL) 1.25 MG (50000 UT) CAPS capsule  Other iron deficiency anemia  Prediabetes - Plan: metFORMIN (GLUCOPHAGE) 500 MG tablet  Other hyperlipidemia  At risk for diabetes mellitus  Class 3 severe obesity with serious comorbidity and body mass index (BMI) of 40.0 to 44.9 in adult, unspecified obesity type ([place])  PLAN:  Vitamin D Deficiency Low vitamin D level contributes to fatigue and are associated with obesity, breast, and colon cancer. Serenity agrees to continue taking prescription Vit D 50,000 IU every week #4 and we will refill for 1 month. She will follow up for routine testing of vitamin D, at least 2-3 times per year to avoid over-replacement. We will continue to monitor.  Iron Deficiency Anemia The diagnosis of Iron deficiency anemia was discussed with [name]. She was given suggestions of iron  rich foods and iron supplement was not prescribed. We will recheck labs at her next visit.  Pre-Diabetes Vanessa Copeland will continue to work on weight loss, exercise, and decreasing simple carbohydrates to help decrease the risk of diabetes. Vanessa Copeland agrees to continue taking metformin 500 mg PO BID #60 and we will refill for 1 month, with the instructions to increase slowly to 1 tablet BID. We will continue to monitor.  Diabetes risk counseling (~15 min) Vanessa Copeland is a 53 y.o. female and has risk factors for diabetes including obesity and pre-diabetes. We discussed intensive lifestyle modifications today with an emphasis on weight loss as well as increasing exercise and decreasing simple carbohydrates in  her diet.  Hyperlipidemia Intensive lifestyle modifications as the first line treatment for hyperlipidemia. We discussed many lifestyle modifications today and Vanessa Copeland will continue to work on diet, exercise and weight loss efforts. We will continue to monitor.  Obesity Vanessa Copeland is currently in the action stage of change. As such, her goal is to continue with weight loss efforts. She has agreed to follow the Category 3 plan. Vanessa Copeland has been instructed to work up to a goal of 150 minutes of combined cardio and strengthening exercise per week for weight loss and overall health benefits. We discussed the following Behavioral Modification Strategies today: holiday eating strategies .   Vanessa Copeland has agreed to follow up with our clinic in 3 weeks. She was informed of the importance of frequent follow up visits to maximize her success with intensive lifestyle modifications for her multiple health conditions.  ALLERGIES: Allergies  Allergen Reactions  . Shellfish Allergy Shortness Of Breath    MEDICATIONS: Current Outpatient Medications on File Prior to Visit  Medication Sig Dispense Refill  . acetaminophen (TYLENOL) 500 MG tablet Take 1,000 mg by mouth every 6 (six) hours as needed.    Marland Kitchen albuterol (VENTOLIN HFA) 108 (90 Base) MCG/ACT inhaler Inhale 2 puffs into the lungs every 6 (six) hours as needed for wheezing or shortness of breath. 1 Inhaler 0  . EPINEPHrine 0.3 mg/0.3 mL IJ SOAJ injection Inject 0.3 mg into the muscle as needed for anaphylaxis.    Marland Kitchen ibuprofen (ADVIL) 800 MG tablet TAKE 1 TABLET(800 MG) BY MOUTH THREE TIMES DAILY 30 tablet 1  . levocetirizine (XYZAL) 5 MG tablet Take 1 tablet (5 mg total) by mouth every evening. 30 tablet 2   No current facility-administered medications on file prior to visit.    PAST MEDICAL HISTORY: Past Medical History:  Diagnosis Date  . Allergy   . Anemia   . Anxiety   . Asthma    mild intermittent(when allergies are flaring)  . Complication of  anesthesia   . Food allergy    Shellfish  . H/O echocardiogram 12/2005   Trace MR, mild TR, nl L ventricular systolic function.  Marland Kitchen Heart murmur    since birth. Never has had any problems  . History of kidney stones   . Kidney stones   . Morbid obesity ([place]) 04/29/2019  . Obesity, unspecified   . PONV (postoperative nausea and vomiting)   . Vitamin D deficiency 01/2010    PAST SURGICAL HISTORY: Past Surgical History:  Procedure Laterality Date  . CESAREAN SECTION     x3  . CYSTOSCOPY WITH RETROGRADE PYELOGRAM, URETEROSCOPY AND STENT PLACEMENT Left 07/16/2018   Procedure: CYSTOSCOPY WITH RETROGRADE PYELOGRAM, URETEROSCOPY AND STENT PLACEMENT;  Surgeon: Cleon Gustin, MD;  Location: [facility];  Service: Urology;  Laterality: Left;  . EXTRACORPOREAL SHOCK WAVE LITHOTRIPSY Left  06/21/2018   Procedure: LEFT EXTRACORPOREAL SHOCK WAVE LITHOTRIPSY (ESWL);  Surgeon: Franchot Gallo, MD;  Location: [facility];  Service: Urology;  Laterality: Left;  . HOLMIUM LASER APPLICATION Left [id number]   Procedure: HOLMIUM LASER APPLICATION;  Surgeon: Cleon Gustin, MD;  Location: [facility];  Service: Urology;  Laterality: Left;  . OOPHORECTOMY  2002   left due to cyst  . TUBAL LIGATION  1996    SOCIAL HISTORY: Social History   Tobacco Use  . Smoking status: Never Smoker  . Smokeless tobacco: Never Used  Substance Use Topics  . Alcohol use: No  . Drug use: No    FAMILY HISTORY: Family History  Problem Relation Age of Onset  . Hypertension Mother   . Anemia Mother   . Colon polyps Mother   . Anxiety disorder Mother   . Cancer Father        lung  . Kidney Stones Father   . Hypertension Father   . Lung cancer Father 40  . Asthma Brother   . Asthma Daughter   . Allergies Daughter   . Asthma Daughter   . Hypertension Brother   . Breast cancer Neg Hx   . Colon cancer Neg Hx   . Diabetes Neg Hx   . Heart disease Neg Hx     ROS: Review of Systems  Constitutional: Positive for weight  loss. Negative for malaise/fatigue.  Respiratory: Negative for shortness of breath.   Endo/Heme/Allergies:       Positive polyphagia    PHYSICAL EXAM: Blood pressure 128/80, pulse (!) 59, temperature 98.2 F (36.8 C), height 5\' 3"  (1.6 m), weight 233 lb (105.7 kg), SpO2 98 %. Body mass index is 41.27 kg/m.  General: Cooperative, alert, well developed, in no acute distress. HEENT: Conjunctivae and lids unremarkable. Neck: No thyromegaly.  Cardiovascular: Regular rhythm.  Lungs: Normal work of breathing. Extremities: No edema.  Neurologic: No focal deficits.   RECENT LABS AND TESTS: BMET    Component Value Date/Time   NA 140 09/06/2019 0840   K 4.4 09/06/2019 0840   CL 101 09/06/2019 0840   CO2 24 09/06/2019 0840   GLUCOSE 88 09/06/2019 0840   GLUCOSE 105 (H) 06/30/2019 1625   BUN 15 09/06/2019 0840   CREATININE 0.74 09/06/2019 0840   CREATININE 0.70 10/21/2012 1145   CALCIUM 9.5 09/06/2019 0840   GFRNONAA 93 09/06/2019 0840   GFRAA 108 09/06/2019 0840   Lab Results  Component Value Date   HGBA1C 5.6 09/06/2019   HGBA1C 5.7 (H) 05/04/2019   Lab Results  Component Value Date   INSULIN 14.6 09/06/2019   CBC    Component Value Date/Time   WBC 8.1 09/06/2019 0840   WBC 8.2 06/30/2019 1625   RBC 3.84 09/06/2019 0840   RBC 4.13 06/30/2019 1625   HGB 11.0 (L) 09/06/2019 0840   HCT 34.0 09/06/2019 0840   PLT 406 (H) 06/30/2019 1625   PLT 140 (L) 05/04/2019 1035   MCV 89 09/06/2019 0840   MCH 28.6 09/06/2019 0840   MCH 28.3 06/30/2019 1625   MCHC 32.4 09/06/2019 0840   MCHC 31.7 06/30/2019 1625   RDW 14.5 09/06/2019 0840   LYMPHSABS 2.8 09/06/2019 0840   MONOABS 0.4 06/30/2019 1625   EOSABS 0.2 09/06/2019 0840   BASOSABS 0.0 09/06/2019 0840   Iron/TIBC/Ferritin/ %Sat    Component Value Date/Time   IRON 75 10/21/2012 1145   FERRITIN 36 10/21/2012 1145   Lipid Panel     Component Value  Date/Time   CHOL 181 09/06/2019 0840   TRIG 71 09/06/2019 0840    HDL 47 09/06/2019 0840   LDLCALC 121 (H) 09/06/2019 0840   Hepatic Function Panel     Component Value Date/Time   PROT 7.4 09/06/2019 0840   ALBUMIN 4.3 09/06/2019 0840   AST 14 09/06/2019 0840   ALT 13 09/06/2019 0840   ALKPHOS 88 09/06/2019 0840   BILITOT 0.7 09/06/2019 0840      Component Value Date/Time   TSH 2.250 09/06/2019 0840   TSH 3.410 05/04/2019 1035   TSH 1.892 10/21/2012 1145    I, Trixie Dredge, am acting as transcriptionist for Briscoe Deutscher, DO  I have reviewed the above documentation for accuracy and completeness, and I agree with the above. Briscoe Deutscher, DO

## 2019-12-14 ENCOUNTER — Encounter (INDEPENDENT_AMBULATORY_CARE_PROVIDER_SITE_OTHER): Payer: Self-pay | Admitting: Family Medicine

## 2019-12-24 ENCOUNTER — Other Ambulatory Visit: Payer: Self-pay

## 2019-12-24 ENCOUNTER — Encounter (HOSPITAL_COMMUNITY): Payer: Self-pay

## 2019-12-24 ENCOUNTER — Ambulatory Visit (HOSPITAL_COMMUNITY)
Admission: EM | Admit: 2019-12-24 | Discharge: 2019-12-24 | Disposition: A | Discharge status: Home / Self Care | Payer: BC Managed Care – PPO | Attending: Family Medicine | Admitting: Family Medicine

## 2019-12-24 DIAGNOSIS — R202 Paresthesia of skin: Secondary | ICD-10-CM | POA: Diagnosis not present

## 2019-12-24 DIAGNOSIS — R030 Elevated blood-pressure reading, without diagnosis of hypertension: Secondary | ICD-10-CM | POA: Diagnosis not present

## 2019-12-24 LAB — CBG MONITORING, ED: Glucose-Capillary: 95 mg/dL (ref 70–99)

## 2019-12-24 LAB — BASIC METABOLIC PANEL
Anion gap: 10 (ref 5–15)
BUN: 14 mg/dL (ref 6–20)
CO2: 25 mmol/L (ref 22–32)
Calcium: 9.1 mg/dL (ref 8.9–10.3)
Chloride: 105 mmol/L (ref 98–111)
Creatinine, Ser: 0.63 mg/dL (ref 0.44–1.00)
GFR calc Af Amer: >60 mL/min (ref >60)
GFR calc non Af Amer: >60 mL/min (ref >60)
Glucose, Bld: 108 mg/dL — ABNORMAL HIGH (ref 70–99)
Potassium: 3.1 mmol/L — ABNORMAL LOW (ref 3.5–5.1)
Sodium: 140 mmol/L (ref 135–145)

## 2019-12-24 LAB — GLUCOSE, CAPILLARY: Glucose-Capillary: 95 mg/dL (ref 70–99)

## 2019-12-24 MED ORDER — HYDROXYZINE HCL 25 MG PO TABS: 25.0000 mg | ORAL_TABLET | Freq: Four times a day (QID) | ORAL | 0 refills | Status: DC

## 2019-12-24 NOTE — Discharge Instructions (Addendum)
Your blood pressure was noted to be elevated during your visit today. You may return here within the next few days to recheck if unable to see your primary care doctor. If your blood pressure remains persistently elevated, you may need to begin taking a medication.  BP (!) 186/88 (BP Location: Right Arm)   Pulse 100   Temp 98.2 F (36.8 C) (Oral)   Resp 16   SpO2 100%

## 2019-12-24 NOTE — ED Triage Notes (Signed)
Pt present tingling in her  Both of her hands. Symptoms started 3 days ago.

## 2019-12-24 NOTE — ED Provider Notes (Signed)
[facility]   [id number] 12/24/19 Arrival Time: [id number]  ASSESSMENT & PLAN:  1. Paresthesias   2. Elevated blood pressure reading without diagnosis of hypertension     Normal neurologic exam. No suspicion for ICH or SAH. No indication for neurodiagnostic imaging at this time. ECG: NSR. No STEMI. No arrhythmia. Discussed. CBP: 95. BMP pending. She questions if anxiety playing a role in current symptoms. Possibly. No s/s of hypertensive urgency.  Trial of: Meds ordered this encounter  Medications  . hydrOXYzine (ATARAX/VISTARIL) 25 MG tablet    Sig: Take 1 tablet (25 mg total) by mouth every 6 (six) hours.    Dispense:  12 tablet    Refill:  0      Discharge Instructions     Your blood pressure was noted to be elevated during your visit today. You may return here within the next few days to recheck if unable to see your primary care doctor. If your blood pressure remains persistently elevated, you may need to begin taking a medication.  BP (!) 186/88 (BP Location: Right Arm)   Pulse 100   Temp 98.2 F (36.8 C) (Oral)   Resp 16   SpO2 100%        Follow-up Information    Schedule an appointment as soon as possible for a visit  with Henson, Vickie L, NP-C.   Specialty: Family Medicine Contact information: [place] [id number] [phone]        [place].   Specialty: Emergency Medicine Why: If symptoms worsen in any way. Contact information: [id number] [place] [phone]          Reassured that these symptoms do not appear to represent a serious or threatening condition. Agrees to proceed to the ED if she develops other symptoms such as alterations of speech, swallowing, vision, motor/sensory systems, or if dizziness worsens.  Reviewed expectations re: course of current medical issues. Questions answered. Outlined signs and  symptoms indicating need for more acute intervention. Patient verbalized understanding. After Visit Summary given.   SUBJECTIVE:  Vanessa Copeland is a 54 y.o. female who reports gradual onset of "tingling sensation" of her hands. None of feet. First noted approx 3 d ago. Off/on; transient; sporadic. No specific aggravating or alleviating factors reported. No recent change in medical history. Has purposefully lost 25 lbs in weight loss program recently. Sleep is not great. Does have worry/anxiety over current symptoms. No increase in caffeine use. Quesitons occasional palpitation/fluttering in chest. No associated SOB. Normal PO intake without n/v. No new medications. Denies dizziness, gait problems, headaches, memory problems, paresthesia, seizures, speech problems, tremors, vertigo and weakness as well as aural pressure and otalgia. Recent infections: none. Head trauma: denied. NRecent travel: none. Reports normal bowel/bladder habits. H/O similar: no. Therapies tried thus far: none.  Social History   Substance and Sexual Activity  Alcohol Use No   Social History   Tobacco Use  Smoking Status Never Smoker  Smokeless Tobacco Never Used   Denies illegal drug use.  Increased blood pressure noted today. Reports that she has not been treated for hypertension in the past.  She reports no chest pain on exertion, no dyspnea on exertion, no swelling of ankles, no orthostatic dizziness or lightheadedness, no orthopnea or paroxysmal nocturnal dyspnea and no intermittent claudication symptoms.   ROS: As per HPI. All other systems negative.    OBJECTIVE:  Vitals:   12/24/19 1044  BP: (!) 186/88  Pulse: 100  Resp: 16  Temp: 98.2 F (36.8 C)  TempSrc: Oral  SpO2: 100%    General appearance: alert; no distress Eyes: PERRLA; EOMI; conjunctiva normal HENT: normocephalic; atraumatic; TMs normal; nasal mucosa normal; oral mucosa normal Neck: supple with FROM Lungs: clear to auscultation  bilaterally Heart: regular rate and rhythm Abdomen: soft, non-tender; bowel sounds normal Extremities: no cyanosis or edema; symmetrical with no gross deformities Skin: warm and dry Neurologic: normal gait; DTR's normal and symmetric; CN 2-12 grossly intact  Investigations: Results for orders placed or performed during the hospital encounter of 12/24/19  Glucose, capillary  Result Value Ref Range   Glucose-Capillary 95 70 - 99 mg/dL  POC CBG monitoring  Result Value Ref Range   Glucose-Capillary 95 70 - 99 mg/dL   Labs Reviewed  GLUCOSE, CAPILLARY  BASIC METABOLIC PANEL  CBG MONITORING, ED   No results found.  Allergies  Allergen Reactions  . Shellfish Allergy Shortness Of Breath    Past Medical History:  Diagnosis Date  . Allergy   . Anemia   . Anxiety   . Asthma    mild intermittent(when allergies are flaring)  . Complication of anesthesia   . Food allergy    Shellfish  . H/O echocardiogram 12/2005   Trace MR, mild TR, nl L ventricular systolic function.  Marland Kitchen Heart murmur    since birth. Never has had any problems  . History of kidney stones   . Kidney stones   . Morbid obesity ([place]) 04/29/2019  . Obesity, unspecified   . PONV (postoperative nausea and vomiting)   . Vitamin D deficiency 01/2010   Social History   Socioeconomic History  . Marital status: Married    Spouse name: Ritaj Lantagne  . Number of children: 3  . Years of education: Not on file  . Highest education level: Not on file  Occupational History  . 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  Tobacco Use  . Smoking status: Never Smoker  . Smokeless tobacco: Never Used  Substance and Sexual Activity  . Alcohol use: No  . Drug use: No  . Sexual activity: Yes    Partners: Male    Birth control/protection: Surgical  Other Topics Concern  . Not on file  Social History Narrative   Lives with husband, 2/3 children.  (2 daughters).  Son lives in [place].  1  dog   Social Determinants of Health   Financial Resource Strain:   . Difficulty of Paying Living Expenses: Not on file  Food Insecurity:   . Worried About Charity fundraiser in the Last Year: Not on file  . Ran Out of Food in the Last Year: Not on file  Transportation Needs:   . Lack of Transportation (Medical): Not on file  . Lack of Transportation (Non-Medical): Not on file  Physical Activity:   . Days of Exercise per Week: Not on file  . Minutes of Exercise per Session: Not on file  Stress:   . Feeling of Stress : Not on file  Social Connections:   . Frequency of Communication with Friends and Family: Not on file  . Frequency of Social Gatherings with Friends and Family: Not on file  . Attends Religious Services: Not on file  . Active Member of Clubs or Organizations: Not on file  . Attends Archivist Meetings: Not on file  . Marital Status: Not on file  Intimate Partner  Violence:   . Fear of Current or Ex-Partner: Not on file  . Emotionally Abused: Not on file  . Physically Abused: Not on file  . Sexually Abused: Not on file   Family History  Problem Relation Age of Onset  . Hypertension Mother   . Anemia Mother   . Colon polyps Mother   . Anxiety disorder Mother   . Cancer Father        lung  . Kidney Stones Father   . Hypertension Father   . Lung cancer Father 24  . Asthma Brother   . Asthma Daughter   . Allergies Daughter   . Asthma Daughter   . Hypertension Brother   . Breast cancer Neg Hx   . Colon cancer Neg Hx   . Diabetes Neg Hx   . Heart disease Neg Hx    Past Surgical History:  Procedure Laterality Date  . CESAREAN SECTION     x3  . CYSTOSCOPY WITH RETROGRADE PYELOGRAM, URETEROSCOPY AND STENT PLACEMENT Left 07/16/2018   Procedure: CYSTOSCOPY WITH RETROGRADE PYELOGRAM, URETEROSCOPY AND STENT PLACEMENT;  Surgeon: Cleon Gustin, MD;  Location: [facility];  Service: Urology;  Laterality: Left;  . EXTRACORPOREAL SHOCK WAVE LITHOTRIPSY Left  06/21/2018   Procedure: LEFT EXTRACORPOREAL SHOCK WAVE LITHOTRIPSY (ESWL);  Surgeon: Franchot Gallo, MD;  Location: [facility];  Service: Urology;  Laterality: Left;  . HOLMIUM LASER APPLICATION Left [id number]   Procedure: HOLMIUM LASER APPLICATION;  Surgeon: Cleon Gustin, MD;  Location: [facility];  Service: Urology;  Laterality: Left;  . OOPHORECTOMY  2002   left due to cyst  . TUBAL LIGATION  1996      Vanessa Kick, MD 12/24/19 1126

## 2019-12-27 ENCOUNTER — Ambulatory Visit (INDEPENDENT_AMBULATORY_CARE_PROVIDER_SITE_OTHER): Payer: BC Managed Care – PPO | Admitting: Family Medicine

## 2019-12-27 ENCOUNTER — Encounter: Payer: Self-pay | Admitting: Family Medicine

## 2019-12-27 ENCOUNTER — Other Ambulatory Visit: Payer: Self-pay

## 2019-12-27 VITALS — BP 120/80 | HR 81 | Temp 97.8°F | Wt 235.4 lb

## 2019-12-27 DIAGNOSIS — F419 Anxiety disorder, unspecified: Secondary | ICD-10-CM

## 2019-12-27 DIAGNOSIS — R202 Paresthesia of skin: Secondary | ICD-10-CM

## 2019-12-27 DIAGNOSIS — R079 Chest pain, unspecified: Secondary | ICD-10-CM | POA: Diagnosis not present

## 2019-12-27 DIAGNOSIS — D508 Other iron deficiency anemias: Secondary | ICD-10-CM | POA: Diagnosis not present

## 2019-12-27 DIAGNOSIS — E876 Hypokalemia: Secondary | ICD-10-CM | POA: Diagnosis not present

## 2019-12-27 MED ORDER — ALPRAZOLAM 0.25 MG PO TABS: 0.2500 mg | ORAL_TABLET | Freq: Two times a day (BID) | ORAL | 0 refills | Status: DC | PRN

## 2019-12-27 NOTE — Progress Notes (Signed)
Subjective:    Patient ID: Vanessa Copeland, female    DOB: [birth year], 54 y.o.   MRN: [id number]  HPI Chief Complaint  Patient presents with  . Urgent care    urgent care follow-up on bp   She has a intermittent issue with what she describes as chest heaviness and just not "feeling right".  Reported having numbness and tingling in her hands and feet when she went to the urgent care and she still has this sensation at times.  She denies having "pain" or any other cardiac symptoms.  No palpitations, DOE, orthopnea, PND, nausea, vomiting. She has been evaluated recently for this problem and had a negative cardiac work-up. Her symptoms were thought to be related to anxiety. She reports her pain is improved when she can calm herself down and was improved when she was given antianxiety medication.  Reports doing her usual walking for exercise without any pain.   She also reports history of anemia and having low potassium at the urgent care visit.  She denies any history of personal heart disease or family history of heart disease  She recalls having some OCD tendencies as a child but states she grew out of this.   She does report increased stress with her mother recently being ill, her husband having heart disease, COVID-19 pandemic among other things.  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    She has been seeing Dr. Leafy Ro for weight management and has been successful with weight loss.  She and Dr. Leafy Ro have also discussed anxiety and Dr. Leafy Ro referred her to a psychologist.  She states she did see the psychologist for 1 visit but has not really scheduled.  States she has a difficult time talking about her feelings.  She denies self-medicating with alcohol or drugs.  Denies fever, chills, night sweats, abdominal pain, vomiting, diarrhea, urinary symptoms, lower extremity edema.   Review of Systems Pertinent positives and negatives in the history of present  illness.     Objective:   Physical Exam BP 120/80   Pulse 81   Temp 97.8 F (36.6 C)   Wt 235 lb 6.4 oz (106.8 kg)   SpO2 98%   BMI 41.70 kg/m   Alert and oriented and in no distress. Cardiac exam shows a regular sinus rhythm without murmurs or gallops. Lungs are clear to auscultation.  Extremities without edema.  Skin is warm and dry.  Her mood is somewhat anxious.       Assessment & Plan:  Anxiety - Plan: ALPRAZolam (XANAX) 0.25 MG tablet, TSH, T4, Free  Intermittent chest pain  Other iron deficiency anemia - Plan: CBC with Differential  Hypokalemia - Plan: Comprehensive metabolic panel  Paresthesias - Plan: Vitamin B12, Iron, TIBC and Ferritin Panel, TSH, T4, Free  In-depth counseling on anxiety and allowing herself to have emotions.  Recommend counseling to help her with coping mechanisms.  She had a difficult time expressing herself today and had to ask many questions in order to obtain information.  She does not appear to be in any danger and denies any thoughts of self-harm. She does seem worried that the chest discomfort she is feeling may still be related to her heart even though this has been worked up and ruled out.  I did offer further work-up including a referral to cardiology and she declines this.  Discussed that her discomfort goes away with antianxiety medication which speaks to this not being cardiac  in nature.  She also is very active and denies having any exertional chest pain. She declined starting on a long-term antianxiety medication but does agree to trying low-dose Xanax for the next week or 2 to see if this improves her symptoms.  She will not take the hydroxyzine again, this made her too sedated.  We will follow-up in 1 weeks to see how she is feeling.  I will also follow-up pending lab results.  Discussed that if she did develop significant chest pain along with any other cardiac symptoms, that she would not discount this as anxiety and seek medical  attention. This note is not being shared with the patient for the following reason:

## 2019-12-27 NOTE — Patient Instructions (Signed)
I recommend you journal about your symptoms and worries.   Take the alprazolam as discussed but use caution in case of sedation.   Consider seeing a therapist again.   I will see you back next week .

## 2019-12-28 ENCOUNTER — Encounter: Payer: Self-pay | Admitting: Family Medicine

## 2019-12-28 LAB — CBC WITH DIFFERENTIAL/PLATELET
Basophils Absolute: 0 10*3/uL (ref 0.0–0.2)
Basos: 0 %
EOS (ABSOLUTE): 0.2 10*3/uL (ref 0.0–0.4)
Eos: 2 %
Hematocrit: 34.8 % (ref 34.0–46.6)
Hemoglobin: 11.2 g/dL (ref 11.1–15.9)
Immature Grans (Abs): 0 10*3/uL (ref 0.0–0.1)
Immature Granulocytes: 0 %
Lymphocytes Absolute: 3 10*3/uL (ref 0.7–3.1)
Lymphs: 42 %
MCH: 28.4 pg (ref 26.6–33.0)
MCHC: 32.2 g/dL (ref 31.5–35.7)
MCV: 88 fL (ref 79–97)
Monocytes Absolute: 0.5 10*3/uL (ref 0.1–0.9)
Monocytes: 8 %
Neutrophils Absolute: 3.4 10*3/uL (ref 1.4–7.0)
Neutrophils: 48 %
Platelets: 187 10*3/uL (ref 150–450)
RBC: 3.94 x10E6/uL (ref 3.77–5.28)
RDW: 14.9 % (ref 11.7–15.4)
WBC: 7.1 10*3/uL (ref 3.4–10.8)

## 2019-12-28 LAB — COMPREHENSIVE METABOLIC PANEL
ALT: 19 IU/L (ref 0–32)
AST: 19 IU/L (ref 0–40)
Albumin/Globulin Ratio: 1.5 (ref 1.2–2.2)
Albumin: 4.3 g/dL (ref 3.8–4.9)
Alkaline Phosphatase: 78 IU/L (ref 39–117)
BUN/Creatinine Ratio: 19 (ref 9–23)
BUN: 15 mg/dL (ref 6–24)
Bilirubin Total: 0.6 mg/dL (ref 0.0–1.2)
CO2: 26 mmol/L (ref 20–29)
Calcium: 9.8 mg/dL (ref 8.7–10.2)
Chloride: 103 mmol/L (ref 96–106)
Creatinine, Ser: 0.77 mg/dL (ref 0.57–1.00)
GFR calc Af Amer: 103 mL/min/{1.73_m2} (ref >59)
GFR calc non Af Amer: 89 mL/min/{1.73_m2} (ref >59)
Globulin, Total: 2.9 g/dL (ref 1.5–4.5)
Glucose: 84 mg/dL (ref 65–99)
Potassium: 4.5 mmol/L (ref 3.5–5.2)
Sodium: 143 mmol/L (ref 134–144)
Total Protein: 7.2 g/dL (ref 6.0–8.5)

## 2019-12-28 LAB — IRON,TIBC AND FERRITIN PANEL
Ferritin: 51 ng/mL (ref 15–150)
Iron Saturation: 18 % (ref 15–55)
Iron: 57 ug/dL (ref 27–159)
Total Iron Binding Capacity: 321 ug/dL (ref 250–450)
UIBC: 264 ug/dL (ref 131–425)

## 2019-12-28 LAB — TSH: TSH: 2 u[IU]/mL (ref 0.450–4.500)

## 2019-12-28 LAB — VITAMIN B12: Vitamin B-12: 324 pg/mL (ref 232–1245)

## 2019-12-28 LAB — T4, FREE: Free T4: 1.14 ng/dL (ref 0.82–1.77)

## 2020-01-02 ENCOUNTER — Ambulatory Visit (INDEPENDENT_AMBULATORY_CARE_PROVIDER_SITE_OTHER): Payer: BC Managed Care – PPO | Admitting: Family Medicine

## 2020-01-02 ENCOUNTER — Other Ambulatory Visit: Payer: Self-pay

## 2020-01-02 ENCOUNTER — Encounter (INDEPENDENT_AMBULATORY_CARE_PROVIDER_SITE_OTHER): Payer: Self-pay | Admitting: Family Medicine

## 2020-01-02 VITALS — BP 121/80 | HR 57 | Temp 97.9°F | Ht 63.0 in | Wt 231.0 lb

## 2020-01-02 DIAGNOSIS — Z9189 Other specified personal risk factors, not elsewhere classified: Secondary | ICD-10-CM | POA: Diagnosis not present

## 2020-01-02 DIAGNOSIS — D508 Other iron deficiency anemias: Secondary | ICD-10-CM | POA: Diagnosis not present

## 2020-01-02 DIAGNOSIS — E559 Vitamin D deficiency, unspecified: Secondary | ICD-10-CM

## 2020-01-02 DIAGNOSIS — R7303 Prediabetes: Secondary | ICD-10-CM

## 2020-01-02 DIAGNOSIS — R55 Syncope and collapse: Secondary | ICD-10-CM

## 2020-01-02 DIAGNOSIS — E538 Deficiency of other specified B group vitamins: Secondary | ICD-10-CM

## 2020-01-02 DIAGNOSIS — Z6841 Body Mass Index (BMI) 40.0 and over, adult: Secondary | ICD-10-CM

## 2020-01-02 DIAGNOSIS — E7849 Other hyperlipidemia: Secondary | ICD-10-CM | POA: Diagnosis not present

## 2020-01-02 MED ORDER — VITAMIN D (ERGOCALCIFEROL) 1.25 MG (50000 UNIT) PO CAPS: 50000.0000 [IU] | ORAL_CAPSULE | ORAL | 0 refills | Status: DC

## 2020-01-03 ENCOUNTER — Encounter: Payer: BC Managed Care – PPO | Admitting: Family Medicine

## 2020-01-03 LAB — LIPID PANEL WITH LDL/HDL RATIO
Cholesterol, Total: 190 mg/dL (ref 100–199)
HDL: 116 mg/dL (ref >39)
LDL Chol Calc (NIH): 62 mg/dL (ref 0–99)
LDL/HDL Ratio: 0.5 ratio (ref 0.0–3.2)
Triglycerides: 63 mg/dL (ref 0–149)
VLDL Cholesterol Cal: 12 mg/dL (ref 5–40)

## 2020-01-03 LAB — HEMOGLOBIN A1C
Est. average glucose Bld gHb Est-mCnc: 100 mg/dL
Hgb A1c MFr Bld: 5.1 % (ref 4.8–5.6)

## 2020-01-03 LAB — VITAMIN D 25 HYDROXY (VIT D DEFICIENCY, FRACTURES): Vit D, 25-Hydroxy: 62.5 ng/mL (ref 30.0–100.0)

## 2020-01-03 NOTE — Progress Notes (Signed)
Chief Complaint:   OBESITY Vanessa Copeland is here to discuss her progress with her obesity treatment plan along with follow-up of her obesity related diagnoses. Vanessa Copeland is keeping a food journal and adhering to recommended goals of 1500 calories and 85+ grams of protein and states she is following her eating plan approximately 75% of the time. Vanessa Copeland states she is walking for 25 minutes 6 times per week.  Today's visit was #: 7 Starting weight: 256 lbs Starting date: 09/06/2019 Today's weight: 231 lbs Today's date: 01/02/2020 Total lbs lost to date: 25 lbs Total lbs lost since last in-office visit: 2 lbs  Interim History: Vanessa Copeland had to visit the ED in December.  Notes reviewed.  She felt "weird" for a week when walking so held.  Back to walking since Saturday.  States she is improving.  "Weird" is clarified as "like I might pass out".  Subjective:   1. Prediabetes Vanessa Copeland has a diagnosis of prediabetes based on her elevated HgA1c and was informed this puts her at greater risk of developing diabetes. She continues to work on diet and exercise to decrease her risk of diabetes. She denies nausea or hypoglycemia.  Last A1c was 5.6 (down from 5.7) on 09/06/2019. Her random insulin ws 14.6 on the same date.  She is currently taking metformin 500 mg daily.  2. Vitamin D deficiency She's Vitamin D level was 32.3 on 09/06/2019. She is currently taking vit D. She denies nausea, vomiting or muscle weakness.  3. Other iron deficiency anemia  CBC Latest Ref Rng & Units 12/27/2019 09/06/2019 06/30/2019  WBC 3.4 - 10.8 x10E3/uL 7.1 8.1 8.2  Hemoglobin 11.1 - 15.9 g/dL 11.2 11.0(L) 11.7(L)  Hematocrit 34.0 - 46.6 % 34.8 34.0 36.9  Platelets 150 - 450 x10E3/uL 187 - 406(H)   Lab Results  Component Value Date   IRON 57 12/27/2019   TIBC 321 12/27/2019   FERRITIN 51 12/27/2019   4. Other hyperlipidemia LDL was 121 on 09/06/2019.  ASCVD is 2.6%.    Lab Results  Component Value Date   CHOL 190  01/02/2020   HDL 116 01/02/2020   LDLCALC 62 01/02/2020   TRIG 63 01/02/2020   Lab Results  Component Value Date   ALT 19 12/27/2019   AST 19 12/27/2019   ALKPHOS 78 12/27/2019   BILITOT 0.6 12/27/2019   5. B12 Deficiency B12 was 324 on 09/06/2019.  Vanessa Copeland is not taking a multivitamin.  Absorption of B12 is decreased with taking metformin.  6. At risk for heart disease Vanessa Copeland is at a higher than average risk for cardiovascular disease due to obesity. Reviewed: no chest pain on exertion, no dyspnea on exertion, and no swelling of ankles.  Vanessa Copeland had an episode of feeling like she was going to pass out while she was walking.  This was accompanied by hypertensive urgency.  Occurred 2 weeks ago and patient was seen in the ED.  Assessment/Plan:   1. Prediabetes Vanessa Copeland will continue to work on weight loss, exercise, and decreasing simple carbohydrates to help decrease the risk of diabetes.   Orders - HgB A1c  2. Vitamin D deficiency Low Vitamin D level contributes to fatigue and are associated with obesity, breast, and colon cancer. She agrees to continue to take prescription Vitamin D @50 ,000 IU every week and will follow-up for routine testing of vitamin D, at least 2-3 times per year to avoid over-replacement.  Orders - Vitamin D (25 hydroxy) - Vitamin D,  Ergocalciferol, (DRISDOL) 1.25 MG (50000 UNIT) CAPS capsule; Take 1 capsule (50,000 Units total) by mouth every 7 (seven) days.  Dispense: 4 capsule; Refill: 0  3. Other iron deficiency anemia Orders and follow up as documented in patient record.  Counseling . Iron is essential for our bodies to make red blood cells.  Reasons that someone may be deficient include: an iron-deficient diet (more likely in those following vegan or vegetarian diets), women with heavy menses, patients with GI disorders or poor absorption, patients that have had bariatric surgery, frequent blood donors, patients with cancer, and patients  with heart disease.   Vanessa Copeland foods include dark leafy greens, red and white meats, eggs, seafood, and beans.   . Certain foods and drinks prevent your body from absorbing iron properly. Avoid eating these foods in the same meal as iron-rich foods or with iron supplements. These foods include: coffee, black tea, and red wine; milk, dairy products, and foods that are high in calcium; beans and soybeans; whole grains.  . Constipation can be a side effect of iron supplementation. Increased water and fiber intake are helpful. Water goal: > 2 liters/Vanessa. Fiber goal: > 25 grams/Vanessa.  4. Other hyperlipidemia Cardiovascular risk and specific lipid/LDL goals reviewed.  We discussed several lifestyle modifications today and Vanessa Copeland will continue to work on diet, exercise and weight loss efforts. Orders and follow up as documented in patient record.  Will monitor this closely in light of new cardiac symptoms.  Counseling Intensive lifestyle modifications are the first line treatment for this issue. . Dietary changes: Increase soluble fiber. Decrease simple carbohydrates. . Exercise changes: Moderate to vigorous-intensity aerobic activity 150 minutes per week if tolerated. . Lipid-lowering medications: see documented in medical record.  Orders - Lipid Panel With LDL/HDL Ratio  5. B12 Deficiency Will monitor.  6. At risk for heart disease Vanessa Copeland was given approximately 15 minutes of coronary artery disease prevention counseling today. She is 54 Vanessa.o. female and has risk factors for heart disease including obesity. We discussed intensive lifestyle modifications today with an emphasis on specific weight loss instructions and strategies.   The ASCVD Risk score Mikey Bussing DC Jr., et al., 2013) failed to calculate for the following reasons:   The valid HDL cholesterol range is 20 to 100 mg/dL  7.  Presyncope EKG at ED showed normal sinus rhythm with no acute findings.  I independently reviewed. Will order an  echo to check for any abnormalities.  Red flags were reviewed.   8. Class 3 severe obesity with serious comorbidity and body mass index (BMI) of 40.0 to 44.9 in adult, unspecified obesity type (HCC) Vanessa Copeland is currently in the action stage of change. As such, her goal is to continue with weight loss efforts. She has agreed to keeping a food journal and adhering to recommended goals of 1500 calories and 85+ grams of protein.   We discussed the following exercise goals today: For substantial health benefits, adults should do at least 150 minutes (2 hours and 30 minutes) a week of moderate-intensity, or 75 minutes (1 hour and 15 minutes) a week of vigorous-intensity aerobic physical activity, or an equivalent combination of moderate- and vigorous-intensity aerobic activity. Aerobic activity should be performed in episodes of at least 10 minutes, and preferably, it should be spread throughout the week. Adults should also include muscle-strengthening activities that involve all major muscle groups on 2 or more days a week.  We discussed the following behavioral modification strategies today: increasing water intake and planning  for success.  Vanessa Copeland has agreed to follow-up with our clinic in 2 weeks. She was informed of the importance of frequent follow-up visits to maximize her success with intensive lifestyle modifications for her multiple health conditions.   Vanessa Copeland was informed we would discuss her lab results at her next visit unless there is a critical issue that needs to be addressed sooner. Vanessa Copeland agreed to keep her next visit at the agreed upon time to discuss these results.  Objective:   Blood pressure 121/80, pulse (!) 57, temperature 97.9 F (36.6 C), temperature source Oral, height 5\' 3"  (1.6 m), weight 231 lb (104.8 kg), SpO2 100 %. Body mass index is 40.92 kg/m.  General: Cooperative, alert, well developed, in no acute distress. HEENT: Conjunctivae and lids unremarkable. Neck:  No thyromegaly.  Cardiovascular: Regular rhythm.  Lungs: Normal work of breathing. Extremities: No edema.  Neurologic: No focal deficits.   Lab Results  Component Value Date   CREATININE 0.77 12/27/2019   BUN 15 12/27/2019   NA 143 12/27/2019   K 4.5 12/27/2019   CL 103 12/27/2019   CO2 26 12/27/2019   Lab Results  Component Value Date   ALT 19 12/27/2019   AST 19 12/27/2019   ALKPHOS 78 12/27/2019   BILITOT 0.6 12/27/2019   Lab Results  Component Value Date   HGBA1C 5.1 01/02/2020   HGBA1C 5.6 09/06/2019   HGBA1C 5.7 (H) 05/04/2019   Lab Results  Component Value Date   INSULIN 14.6 09/06/2019   Lab Results  Component Value Date   TSH 2.000 12/27/2019   Lab Results  Component Value Date   CHOL 190 01/02/2020   HDL 116 01/02/2020   LDLCALC 62 01/02/2020   TRIG 63 01/02/2020   Lab Results  Component Value Date   WBC 7.1 12/27/2019   HGB 11.2 12/27/2019   HCT 34.8 12/27/2019   MCV 88 12/27/2019   PLT 187 12/27/2019   Lab Results  Component Value Date   IRON 57 12/27/2019   TIBC 321 12/27/2019   FERRITIN 51 12/27/2019   Attestation Statements:   Reviewed by clinician on Vanessa of visit: allergies, medications, problem list, medical history, surgical history, family history, social history, and previous encounter notes.  I, Water quality scientist, CMA, am acting as Location manager for [employer], DO.  I have reviewed the above documentation for accuracy and completeness, and I agree with the above. Briscoe Deutscher, DO

## 2020-01-04 ENCOUNTER — Encounter (INDEPENDENT_AMBULATORY_CARE_PROVIDER_SITE_OTHER): Payer: Self-pay | Admitting: Family Medicine

## 2020-01-04 ENCOUNTER — Encounter: Payer: BC Managed Care – PPO | Admitting: Family Medicine

## 2020-01-06 ENCOUNTER — Encounter (INDEPENDENT_AMBULATORY_CARE_PROVIDER_SITE_OTHER): Payer: Self-pay | Admitting: Family Medicine

## 2020-01-09 NOTE — Addendum Note (Signed)
Addended by: Renee Ramus on: 01/09/2020 03:37 PM   Modules accepted: Orders

## 2020-01-13 ENCOUNTER — Other Ambulatory Visit: Payer: Self-pay | Admitting: Family Medicine

## 2020-01-13 NOTE — Telephone Encounter (Signed)
Is this okay to refill? 

## 2020-01-18 ENCOUNTER — Telehealth: Payer: Self-pay | Admitting: *Deleted

## 2020-01-18 ENCOUNTER — Emergency Department (HOSPITAL_COMMUNITY)
Admission: EM | Admit: 2020-01-18 | Discharge: 2020-01-19 | Disposition: A | Discharge status: Home / Self Care | Payer: BC Managed Care – PPO | Attending: Emergency Medicine | Admitting: Emergency Medicine

## 2020-01-18 ENCOUNTER — Other Ambulatory Visit: Payer: Self-pay

## 2020-01-18 ENCOUNTER — Encounter (HOSPITAL_COMMUNITY): Payer: Self-pay | Admitting: Emergency Medicine

## 2020-01-18 ENCOUNTER — Encounter (INDEPENDENT_AMBULATORY_CARE_PROVIDER_SITE_OTHER): Payer: Self-pay | Admitting: Family Medicine

## 2020-01-18 ENCOUNTER — Ambulatory Visit (HOSPITAL_BASED_OUTPATIENT_CLINIC_OR_DEPARTMENT_OTHER): Payer: BC Managed Care – PPO

## 2020-01-18 ENCOUNTER — Emergency Department (HOSPITAL_COMMUNITY): Payer: BC Managed Care – PPO

## 2020-01-18 DIAGNOSIS — R55 Syncope and collapse: Secondary | ICD-10-CM | POA: Insufficient documentation

## 2020-01-18 DIAGNOSIS — Z79899 Other long term (current) drug therapy: Secondary | ICD-10-CM | POA: Diagnosis not present

## 2020-01-18 DIAGNOSIS — R079 Chest pain, unspecified: Secondary | ICD-10-CM | POA: Diagnosis not present

## 2020-01-18 DIAGNOSIS — R0789 Other chest pain: Secondary | ICD-10-CM | POA: Insufficient documentation

## 2020-01-18 DIAGNOSIS — R931 Abnormal findings on diagnostic imaging of heart and coronary circulation: Secondary | ICD-10-CM | POA: Insufficient documentation

## 2020-01-18 DIAGNOSIS — Z7984 Long term (current) use of oral hypoglycemic drugs: Secondary | ICD-10-CM | POA: Diagnosis not present

## 2020-01-18 DIAGNOSIS — J45909 Unspecified asthma, uncomplicated: Secondary | ICD-10-CM | POA: Diagnosis not present

## 2020-01-18 DIAGNOSIS — R072 Precordial pain: Secondary | ICD-10-CM | POA: Diagnosis not present

## 2020-01-18 LAB — BASIC METABOLIC PANEL
Anion gap: 10 (ref 5–15)
BUN: 16 mg/dL (ref 6–20)
CO2: 25 mmol/L (ref 22–32)
Calcium: 9.2 mg/dL (ref 8.9–10.3)
Chloride: 103 mmol/L (ref 98–111)
Creatinine, Ser: 0.71 mg/dL (ref 0.44–1.00)
GFR calc Af Amer: >60 mL/min (ref >60)
GFR calc non Af Amer: >60 mL/min (ref >60)
Glucose, Bld: 104 mg/dL — ABNORMAL HIGH (ref 70–99)
Potassium: 3.5 mmol/L (ref 3.5–5.1)
Sodium: 138 mmol/L (ref 135–145)

## 2020-01-18 LAB — CBC
HCT: 36.3 % (ref 36.0–46.0)
Hemoglobin: 11.7 g/dL — ABNORMAL LOW (ref 12.0–15.0)
MCH: 29 pg (ref 26.0–34.0)
MCHC: 32.2 g/dL (ref 30.0–36.0)
MCV: 89.9 fL (ref 80.0–100.0)
Platelets: 319 10*3/uL (ref 150–400)
RBC: 4.04 MIL/uL (ref 3.87–5.11)
RDW: 16.2 % — ABNORMAL HIGH (ref 11.5–15.5)
WBC: 7 10*3/uL (ref 4.0–10.5)
nRBC: 0 % (ref 0.0–0.2)

## 2020-01-18 LAB — TROPONIN I (HIGH SENSITIVITY)
Troponin I (High Sensitivity): 6 ng/L (ref <18)
Troponin I (High Sensitivity): 2 ng/L (ref <18)

## 2020-01-18 MED ORDER — SODIUM CHLORIDE 0.9% FLUSH: 3.0000 mL | Freq: Once | INTRAVENOUS | Status: DC

## 2020-01-18 MED ORDER — ALBUTEROL SULFATE HFA 108 (90 BASE) MCG/ACT IN AERS
1.0000 | INHALATION_SPRAY | Freq: Once | RESPIRATORY_TRACT | Status: DC
  Filled 2020-01-18: qty 6.7

## 2020-01-18 NOTE — Telephone Encounter (Signed)
This pt had an outpatient echo done at our office and read by Dr. Johnsie Cancel and Dr. Meda Coffee. Pts PCP Briscoe Deutscher ordered the echo.  Dr. Meda Coffee read the echo and advised Briscoe Deutscher to have the pt schedule as a new pt with any Cardiologist in our Practice.  Per Dr. Meda Coffee, pt needs to be seen for abnormal echo and chest pain. Scheduling dept called the pt and scheduled her to come in tomorrow 1/28 at 2:40 pm.  Scheduling sent a message to chart prep.  Pt made aware of appt date and time, and agreed to this plan.  When scheduling called the pt to endorse her appt, she said her Husband is making her go to the ER now, for he is worried about her, but she would like to keep her appt with Dr. Acie Fredrickson tomorrow, and will call to cancel as needed.  Will send this message to Dr. Elmarie Shiley RN as an Juluis Rainier.

## 2020-01-18 NOTE — Telephone Encounter (Signed)
Please advise 

## 2020-01-18 NOTE — Discharge Instructions (Signed)
Your work-up today was reassuring.  Our cardiologist on-call reviewed your echocardiogram and feels confident that you will be safe for work-up in the clinic tomorrow.  Please keep your appointment with Dr. Acie Fredrickson as scheduled.   Return to the emergency department if any concerning signs or symptoms develop such as severe chest pains, persistent vomiting, severe shortness of breath or loss of consciousness.

## 2020-01-18 NOTE — ED Triage Notes (Signed)
Pt arrives to ER with c/c of chest pain pt also had an echo done today and was told it was abnormal. Pt does have an appt tomorrow as a follow up.

## 2020-01-18 NOTE — ED Provider Notes (Signed)
[facility] EMERGENCY DEPARTMENT Provider Note   CSN: [id number] Arrival date & time: 01/18/20  1541     History Chief Complaint  Patient presents with  . Chest Pain    Vanessa Copeland is a 54 y.o. female with history of anxiety, asthma, nephrolithiasis, morbid obesity presents for evaluation of acute onset, persistent "feeling weird" for 1 month.  She reports symptoms began around January 1.  She went to urgent care on January 2 where she was noted to be hypertensive but had a reassuring work-up at the time and was sent home.  She reports she feels a sensation that comes on in "waves".  She notes that she does sometimes feel some substernal chest pressure but has a hard time describing her symptoms.  She denies any shortness of breath, nausea, vomiting, diaphoresis, lightheadedness, abdominal pain.  She mentioned her symptoms to her weight loss specialist who ordered an outpatient echocardiogram which was done today.  She received a phone call that her echo was abnormal; she has an appointment with cardiology tomorrow but she was concerned about her symptoms so she came here for further evaluation.  She is a non-smoker, denies recreational drug use, no alcohol intake.  She does drink a few caffeinated beverages daily.  No recent travel or surgeries, no hemoptysis, no prior history of DVT or PE, denies leg swelling   The history is provided by the patient.       Past Medical History:  Diagnosis Date  . Allergy   . Anemia   . Anxiety   . Asthma    mild intermittent(when allergies are flaring)  . Complication of anesthesia   . Food allergy    Shellfish  . H/O echocardiogram 12/2005   Trace MR, mild TR, nl L ventricular systolic function.  Marland Kitchen Heart murmur    since birth. Never has had any problems  . History of kidney stones   . Kidney stones   . Morbid obesity ([place]) 04/29/2019  . Obesity, unspecified   . PONV (postoperative nausea and vomiting)   . Vitamin D  deficiency 01/2010    Patient Active Problem List   Diagnosis Date Noted  . Prediabetes 10/11/2019  . Class 3 severe obesity with serious comorbidity and body mass index (BMI) of 40.0 to 44.9 in adult ([place]) 10/11/2019  . Ear itch 07/20/2019  . Throat irritation 07/20/2019  . Seasonal allergies 04/29/2019  . Asthma   . Allergy   . History of kidney stones   . Vitamin D deficiency 10/21/2016  . Anemia 10/21/2016  . Screening for STD (sexually transmitted disease) 10/21/2016  . Allergic rhinitis 10/22/2012    Past Surgical History:  Procedure Laterality Date  . CESAREAN SECTION     x3  . CYSTOSCOPY WITH RETROGRADE PYELOGRAM, URETEROSCOPY AND STENT PLACEMENT Left 07/16/2018   Procedure: CYSTOSCOPY WITH RETROGRADE PYELOGRAM, URETEROSCOPY AND STENT PLACEMENT;  Surgeon: Cleon Gustin, MD;  Location: [facility];  Service: Urology;  Laterality: Left;  . EXTRACORPOREAL SHOCK WAVE LITHOTRIPSY Left 06/21/2018   Procedure: LEFT EXTRACORPOREAL SHOCK WAVE LITHOTRIPSY (ESWL);  Surgeon: Franchot Gallo, MD;  Location: [facility];  Service: Urology;  Laterality: Left;  . HOLMIUM LASER APPLICATION Left [id number]   Procedure: HOLMIUM LASER APPLICATION;  Surgeon: Cleon Gustin, MD;  Location: [facility];  Service: Urology;  Laterality: Left;  . OOPHORECTOMY  2002   left due to cyst  . TUBAL LIGATION  1996     OB History    Gravida  3   Para  3   Term      Preterm      AB      Living  3     SAB      TAB      Ectopic      Multiple      Live Births              Family History  Problem Relation Age of Onset  . Hypertension Mother   . Anemia Mother   . Colon polyps Mother   . Anxiety disorder Mother   . Cancer Father        lung  . Kidney Stones Father   . Hypertension Father   . Lung cancer Father 52  . Asthma Brother   . Asthma Daughter   . Allergies Daughter   . Asthma Daughter   . Hypertension Brother   . Breast cancer Neg Hx   . Colon cancer Neg Hx   .  Diabetes Neg Hx   . Heart disease Neg Hx     Social History   Tobacco Use  . Smoking status: Never Smoker  . Smokeless tobacco: Never Used  Substance Use Topics  . Alcohol use: No  . Drug use: No    Home Medications Prior to Admission medications   Medication Sig Start Date End Date Taking? Authorizing Provider  acetaminophen (TYLENOL) 500 MG tablet Take 1,000 mg by mouth every 6 (six) hours as needed.    [provider]  albuterol (VENTOLIN HFA) 108 (90 Base) MCG/ACT inhaler Inhale 2 puffs into the lungs every 6 (six) hours as needed for wheezing or shortness of breath. 04/29/19   Henson, Vickie L, NP-C  ALPRAZolam (XANAX) 0.25 MG tablet Take 1 tablet (0.25 mg total) by mouth 2 (two) times daily as needed for anxiety. 12/27/19   Henson, Vickie L, NP-C  EPINEPHrine 0.3 mg/0.3 mL IJ SOAJ injection Inject 0.3 mg into the muscle as needed for anaphylaxis.    [provider]  hydrOXYzine (ATARAX/VISTARIL) 25 MG tablet Take 1 tablet (25 mg total) by mouth every 6 (six) hours. 12/24/19   Vanessa Kick, MD  ibuprofen (ADVIL) 800 MG tablet TAKE 1 TABLET(800 MG) BY MOUTH THREE TIMES DAILY 01/13/20   Raenette Rover, Vickie L, NP-C  levocetirizine (XYZAL) 5 MG tablet Take 1 tablet (5 mg total) by mouth every evening. 07/20/19   Tysinger, Camelia Eng, PA-C  metFORMIN (GLUCOPHAGE) 500 MG tablet Take 1 tablet (500 mg total) by mouth daily with breakfast. 12/08/19   Briscoe Deutscher, DO  Vitamin D, Ergocalciferol, (DRISDOL) 1.25 MG (50000 UNIT) CAPS capsule Take 1 capsule (50,000 Units total) by mouth every 7 (seven) days. 01/02/20   Briscoe Deutscher, DO    Allergies    Shellfish allergy  Review of Systems   Review of Systems  Constitutional: Negative for chills, diaphoresis, fatigue and fever.  Respiratory: Negative for shortness of breath.   Cardiovascular: Positive for chest pain (pressure?). Negative for palpitations and leg swelling.  Gastrointestinal: Negative for abdominal pain, diarrhea, nausea  and vomiting.  Genitourinary: Negative for dysuria, hematuria and urgency.  Musculoskeletal: Negative for back pain and neck pain.  Neurological: Negative for syncope, weakness, light-headedness and headaches.  All other systems reviewed and are negative.   Physical Exam Updated Vital Signs BP 135/79   Pulse 65   Temp 98.3 F (36.8 C) (Oral)   Resp (!) 21   SpO2 99%   Physical Exam Vitals and nursing  note reviewed.  Constitutional:      General: She is not in acute distress.    Appearance: She is well-developed. She is obese.     Comments: Appears anxious  HENT:     Head: Normocephalic and atraumatic.  Eyes:     General:        Right eye: No discharge.        Left eye: No discharge.     Conjunctiva/sclera: Conjunctivae normal.  Neck:     Vascular: No JVD.     Trachea: No tracheal deviation.  Cardiovascular:     Rate and Rhythm: Normal rate and regular rhythm.     Pulses:          Radial pulses are 2+ on the right side and 2+ on the left side.       Dorsalis pedis pulses are 2+ on the right side and 2+ on the left side.       Posterior tibial pulses are 2+ on the right side and 2+ on the left side.  Pulmonary:     Effort: Pulmonary effort is normal.     Comments: Speaking in full sentences without difficulty.  Soft scattered expiratory wheezes.  SPO2 saturations 99% on room air. Chest:     Chest wall: No tenderness.  Abdominal:     General: There is no distension.     Palpations: Abdomen is soft. There is no mass.     Tenderness: There is no abdominal tenderness.  Musculoskeletal:     Cervical back: Normal range of motion and neck supple.     Right lower leg: No tenderness. No edema.     Left lower leg: No tenderness. No edema.  Skin:    General: Skin is warm and dry.     Findings: No erythema.  Neurological:     Mental Status: She is alert.  Psychiatric:        Behavior: Behavior normal.     ED Results / Procedures / Treatments   Labs (all labs ordered  are listed, but only abnormal results are displayed) Labs Reviewed  BASIC METABOLIC PANEL - Abnormal; Notable for the following components:      Result Value   Glucose, Bld 104 (*)    All other components within normal limits  CBC - Abnormal; Notable for the following components:   Hemoglobin 11.7 (*)    RDW 16.2 (*)    All other components within normal limits  TROPONIN I (HIGH SENSITIVITY)  TROPONIN I (HIGH SENSITIVITY)    EKG EKG Interpretation  Date/Time:  Wednesday January 18 2020 15:45:41 EST Ventricular Rate:  89 PR Interval:  136 QRS Duration: 70 QT Interval:  340 QTC Calculation: 413 R Axis:   66 Text Interpretation: Normal sinus rhythm Nonspecific T wave abnormality Abnormal ECG Confirmed by Pattricia Boss [phone]) on 01/18/2020 6:15:50 PM   Radiology DG Chest 2 View  Result Date: 01/18/2020 CLINICAL DATA:  Chest pain EXAM: CHEST - 2 VIEW COMPARISON:  None. FINDINGS: The heart size and mediastinal contours are within normal limits. Both lungs are clear. The visualized skeletal structures are unremarkable. IMPRESSION: No active cardiopulmonary disease. Electronically Signed   By: Donavan Foil M.D.   On: 01/18/2020 18:08   ECHOCARDIOGRAM COMPLETE  Result Date: 01/18/2020   ECHOCARDIOGRAM REPORT   Patient Name:   Vanessa Copeland Date of Exam: 01/18/2020 Medical Rec #:  [id number]      Height:       63.0 in Accession #:  [id number]     Weight:       231.0 lb Date of Birth:  11-21-1966      BSA:          2.06 m Patient Age:    58 years       BP:           121/80 mmHg Patient Gender: F              HR:           79 bpm. Exam Location:  [address] Procedure: 2D Echo, 3D Echo, Cardiac Doppler, Color Doppler and Strain Analysis Indications:    R55 Syncope  History:        Patient has no prior history of Echocardiogram examinations.                 Risk Factors:Pre-diabetes. Morbid obesity.  Sonographer:    Jessee Avers, RDCS Referring Phys: [place]  1. Left  ventricular ejection fraction, by visual estimation, is 55 to 60%. The left ventricle has normal function. There is no left ventricular hypertrophy.  2. Mildly dilated left ventricular internal cavity size.  3. The left ventricle demonstrates regional wall motion abnormalities.  4. Inferior basal hypokinesis     Normal GLS -20.1.  5. Global right ventricle has normal systolic function.The right ventricular size is normal. No increase in right ventricular wall thickness.  6. Left atrial size was mildly dilated.  7. Right atrial size was normal.  8. The mitral valve is normal in structure. Mild mitral valve regurgitation. No evidence of mitral stenosis.  9. The tricuspid valve is normal in structure. 10. The tricuspid valve is normal in structure. Tricuspid valve regurgitation is mild. 11. The aortic valve is normal in structure. Aortic valve regurgitation is not visualized. No evidence of aortic valve sclerosis or stenosis. 12. The pulmonic valve was normal in structure. Pulmonic valve regurgitation is not visualized. 13. Normal pulmonary artery systolic pressure. 14. The inferior vena cava is normal in size with greater than 50% respiratory variability, suggesting right atrial pressure of 3 mmHg. FINDINGS  Left Ventricle: Left ventricular ejection fraction, by visual estimation, is 55 to 60%. The left ventricle has normal function. The left ventricle demonstrates regional wall motion abnormalities. The left ventricular internal cavity size was mildly dilated left ventricle. There is no left ventricular hypertrophy. Left ventricular diastolic parameters were normal. Normal left atrial pressure. Inferior basal hypokinesis Normal GLS -20.1. Right Ventricle: The right ventricular size is normal. No increase in right ventricular wall thickness. Global RV systolic function is has normal systolic function. The tricuspid regurgitant velocity is 2.45 m/s, and with an assumed right atrial pressure  of 3 mmHg, the estimated  right ventricular systolic pressure is normal at 26.9 mmHg. Left Atrium: Left atrial size was mildly dilated. Right Atrium: Right atrial size was normal in size Pericardium: There is no evidence of pericardial effusion. Mitral Valve: The mitral valve is normal in structure. Mild mitral valve regurgitation. No evidence of mitral valve stenosis by observation. Tricuspid Valve: The tricuspid valve is normal in structure. Tricuspid valve regurgitation is mild. Aortic Valve: The aortic valve is normal in structure. Aortic valve regurgitation is not visualized. The aortic valve is structurally normal, with no evidence of sclerosis or stenosis. Pulmonic Valve: The pulmonic valve was normal in structure. Pulmonic valve regurgitation is not visualized. Pulmonic regurgitation is not visualized. Aorta: The aortic root, ascending aorta and aortic arch are all structurally normal,  with no evidence of dilitation or obstruction. Venous: The inferior vena cava is normal in size with greater than 50% respiratory variability, suggesting right atrial pressure of 3 mmHg. IAS/Shunts: No atrial level shunt detected by color flow Doppler. There is no evidence of a patent foramen ovale. No ventricular septal defect is seen or detected. There is no evidence of an atrial septal defect.  LEFT VENTRICLE PLAX 2D LVIDd:         4.80 cm  Diastology LVIDs:         3.20 cm  LV e' lateral:   7.94 cm/s LV PW:         1.10 cm  LV E/e' lateral: 12.0 LV IVS:        1.00 cm  LV e' medial:    8.81 cm/s LVOT diam:     2.00 cm  LV E/e' medial:  10.8 LV SV:         67 ml LV SV Index:   30.06    2D Longitudinal Strain LVOT Area:     3.14 cm 2D Strain GLS (A2C):   -16.3 %                         2D Strain GLS (A3C):   -22.9 %                         2D Strain GLS (A4C):   -21.2 %                         2D Strain GLS Avg:     -20.1 %                          3D Volume EF:                         3D EF:        58 %                         LV EDV:       147 ml                          LV ESV:       62 ml                         LV SV:        85 ml RIGHT VENTRICLE RV Basal diam:  3.20 cm RV S prime:     12.60 cm/s TAPSE (M-mode): 2.1 cm RVSP:           26.9 mmHg LEFT ATRIUM             Index       RIGHT ATRIUM           Index LA diam:        4.30 cm 2.09 cm/m  RA Pressure: 3.00 mmHg LA Vol (A2C):   43.4 ml 21.11 ml/m RA Area:     8.52 cm LA Vol (A4C):   55.1 ml 26.80 ml/m RA Volume:   15.60 ml  7.59 ml/m LA Biplane Vol: 51.3 ml 24.95 ml/m  AORTIC VALVE LVOT Vmax:   124.00 cm/s LVOT Vmean:  79.500 cm/s  LVOT VTI:    0.293 m  AORTA Ao Root diam: 2.70 cm Ao Asc diam:  3.30 cm MITRAL VALVE                        TRICUSPID VALVE                                     TR Peak grad:   23.9 mmHg                                     TR Vmax:        251.00 cm/s MV Decel Time: 201 msec             Estimated RAP:  3.00 mmHg MV E velocity: 95.10 cm/s 103 cm/s  RVSP:           26.9 mmHg MV A velocity: 62.10 cm/s 70.3 cm/s MV E/A ratio:  1.53       1.5       SHUNTS                                     Systemic VTI:  0.29 m                                     Systemic Diam: 2.00 cm  Jenkins Rouge MD Electronically signed by Jenkins Rouge MD Signature Date/Time: 01/18/2020/8:02:05 AM    Final     Procedures Procedures (including critical care time)  Medications Ordered in ED Medications  sodium chloride flush (NS) 0.9 % injection 3 mL (has no administration in time range)    ED Course  I have reviewed the triage vital signs and the nursing notes.  Pertinent labs & imaging results that were available during my care of the patient were reviewed by me and considered in my medical decision making (see chart for details).    MDM Rules/Calculators/A&P                      Patient presenting for evaluation of persistently feeling unwell for 1 month.  She notes very nebulous symptoms including some potential chest pressure though she has a very hard time describing her symptoms.   She is afebrile, initially hypertensive with resolution on subsequent reevaluations.  She had an echocardiogram earlier today which showed regional wall motion abnormalities, otherwise fairly reassuring.  EKG shows no acute ischemic abnormalities.  Lab work reviewed by me shows no leukocytosis, mild anemia, no metabolic derangements, no renal insufficiency.Serial troponins are negative.  Her symptoms are atypical for ACS/MI.  She did have some mild expiratory wheezes on auscultation of the lungs but no signs of respiratory distress.  No evidence of pneumonia, pneumothorax or heart failure on work-up today.  Doubt PE, dissection, cardiac tamponade, esophageal rupture, pneumonia or pneumothorax.  9:50PM CONSULT: Spoke with Dr. Aundra Dubin with cardiology who has reviewed the patient's echocardiogram.  He feels reassured that the patient can follow-up with Dr. Acie Fredrickson with cardiology as scheduled tomorrow.  On reevaluation patient resting comfortably in no apparent distress.  I did offer her a refill of her albuterol inhaler which  she will take home with her.  Discussed strict ED return precautions. Patient verbalized understanding of and agreement with plan and is safe for discharge home at this time.  Final Clinical Impression(s) / ED Diagnoses Final diagnoses:  Atypical chest pain    Rx / DC Orders ED Discharge Orders    None       Renita Papa, PA-C 01/18/20 [place], DO 01/18/20 [id number]

## 2020-01-19 ENCOUNTER — Ambulatory Visit (INDEPENDENT_AMBULATORY_CARE_PROVIDER_SITE_OTHER): Payer: BC Managed Care – PPO | Admitting: Cardiovascular Disease

## 2020-01-19 ENCOUNTER — Encounter: Payer: Self-pay | Admitting: Cardiovascular Disease

## 2020-01-19 VITALS — BP 168/84 | HR 78 | Ht 63.0 in | Wt 234.4 lb

## 2020-01-19 DIAGNOSIS — R931 Abnormal findings on diagnostic imaging of heart and coronary circulation: Secondary | ICD-10-CM

## 2020-01-19 DIAGNOSIS — R079 Chest pain, unspecified: Secondary | ICD-10-CM

## 2020-01-19 MED ORDER — METOPROLOL TARTRATE 100 MG PO TABS: ORAL_TABLET | ORAL | 0 refills | Status: DC

## 2020-01-19 MED ORDER — PROPRANOLOL HCL 10 MG PO TABS: 10.0000 mg | ORAL_TABLET | Freq: Four times a day (QID) | ORAL | 11 refills | Status: DC | PRN

## 2020-01-19 NOTE — Progress Notes (Signed)
Cardiology Office Note:    Date:  01/19/2020   ID:  Vanessa Copeland, DOB 07-17-1966, MRN [id number]  PCP:  Girtha Rm, NP-C  Cardiologist:  Demarquez Ciolek  Electrophysiologist:  None   Referring MD: Girtha Rm, NP-C   Chief Complaint  Patient presents with  . Chest Pain    History of Present Illness:    Vanessa Copeland is a 54 y.o. female with a hx of chest pain.  She recently had an echocardiogram and was noticed to have some wall motion abnormalities.  We are asked to see her today by Briscoe Deutscher, MD for further evaluation of this abnormal wall motion.  She has been having some palpitation  Single isolated palps - like she was startled.  They come in waves.  Is in [place] [facility]   Walks every day .   25 min a day .   Stopped last b/c she was scared of these palpitations/ pressure   She was seen in the emergency room last night.  Labs were normal.  Troponin was negative x2.  She was still having the episode of chest discomfort at that time.  The episodes may last as long as 10-15 minutes.   BP is normally ok She does not watch her salt.  Eats bacon on occasion .   Grilled chicken.   No soup   Non smoker  Mother has HTN Brother has HTN   Past Medical History:  Diagnosis Date  . Allergy   . Anemia   . Anxiety   . Asthma    mild intermittent(when allergies are flaring)  . Complication of anesthesia   . Food allergy    Shellfish  . H/O echocardiogram 12/2005   Trace MR, mild TR, nl L ventricular systolic function.  Marland Kitchen Heart murmur    since birth. Never has had any problems  . History of kidney stones   . Kidney stones   . Morbid obesity ([place]) 04/29/2019  . Obesity, unspecified   . PONV (postoperative nausea and vomiting)   . Vitamin D deficiency 01/2010    Past Surgical History:  Procedure Laterality Date  . CESAREAN SECTION     x3  . CYSTOSCOPY WITH RETROGRADE PYELOGRAM, URETEROSCOPY AND STENT PLACEMENT Left 07/16/2018   Procedure:  CYSTOSCOPY WITH RETROGRADE PYELOGRAM, URETEROSCOPY AND STENT PLACEMENT;  Surgeon: Cleon Gustin, MD;  Location: [facility];  Service: Urology;  Laterality: Left;  . EXTRACORPOREAL SHOCK WAVE LITHOTRIPSY Left 06/21/2018   Procedure: LEFT EXTRACORPOREAL SHOCK WAVE LITHOTRIPSY (ESWL);  Surgeon: Franchot Gallo, MD;  Location: [facility];  Service: Urology;  Laterality: Left;  . HOLMIUM LASER APPLICATION Left [id number]   Procedure: HOLMIUM LASER APPLICATION;  Surgeon: Cleon Gustin, MD;  Location: [facility];  Service: Urology;  Laterality: Left;  . OOPHORECTOMY  2002   left due to cyst  . TUBAL LIGATION  1996    Current Medications: Current Meds  Medication Sig  . acetaminophen (TYLENOL) 500 MG tablet Take 1,000 mg by mouth every 6 (six) hours as needed.  Marland Kitchen albuterol (VENTOLIN HFA) 108 (90 Base) MCG/ACT inhaler Inhale 2 puffs into the lungs every 6 (six) hours as needed for wheezing or shortness of breath.  . EPINEPHrine 0.3 mg/0.3 mL IJ SOAJ injection Inject 0.3 mg into the muscle as needed for anaphylaxis.  Marland Kitchen ibuprofen (ADVIL) 800 MG tablet TAKE 1 TABLET(800 MG) BY MOUTH THREE TIMES DAILY  . levocetirizine (XYZAL) 5 MG tablet Take 1 tablet (5 mg  total) by mouth every evening.  . Vitamin D, Ergocalciferol, (DRISDOL) 1.25 MG (50000 UNIT) CAPS capsule Take 1 capsule (50,000 Units total) by mouth every 7 (seven) days.     Allergies:   Shellfish allergy   Social History   Socioeconomic History  . Marital status: Married    Spouse name: Tinna Abbate  . Number of children: 3  . Years of education: Not on file  . Highest education level: Not on file  Occupational History  . 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  Tobacco Use  . Smoking status: Never Smoker  . Smokeless tobacco: Never Used  Substance and Sexual Activity  . Alcohol use: No  . Drug use: No  . Sexual activity: Yes    Partners: Male    Birth control/protection: Surgical  Other  Topics Concern  . Not on file  Social History Narrative   Lives with husband, 2/3 children.  (2 daughters).  Son lives in [place].  1 dog   Social Determinants of Health   Financial Resource Strain:   . Difficulty of Paying Living Expenses: Not on file  Food Insecurity:   . Worried About Charity fundraiser in the Last Year: Not on file  . Ran Out of Food in the Last Year: Not on file  Transportation Needs:   . Lack of Transportation (Medical): Not on file  . Lack of Transportation (Non-Medical): Not on file  Physical Activity:   . Days of Exercise per Week: Not on file  . Minutes of Exercise per Session: Not on file  Stress:   . Feeling of Stress : Not on file  Social Connections:   . Frequency of Communication with Friends and Family: Not on file  . Frequency of Social Gatherings with Friends and Family: Not on file  . Attends Religious Services: Not on file  . Active Member of Clubs or Organizations: Not on file  . Attends Archivist Meetings: Not on file  . Marital Status: Not on file     Family History: The patient's family history includes Allergies in her daughter; Anemia in her mother; Anxiety disorder in her mother; Asthma in her brother, daughter, and daughter; Cancer in her father; Colon polyps in her mother; Hypertension in her brother, father, and mother; Kidney Stones in her father; Lung cancer (age of onset: 54) in her father. There is no history of Breast cancer, Colon cancer, Diabetes, or Heart disease.  ROS:   Please see the history of present illness.     All other systems reviewed and are negative.  EKGs/Labs/Other Studies Reviewed:    The following studies were reviewed today:   EKG:  NSR ,  No ST or T wave changes.   Recent Labs: 12/27/2019: ALT 19; TSH 2.000 01/18/2020: BUN 16; Creatinine, Ser 0.71; Hemoglobin 11.7; Platelets 319; Potassium 3.5; Sodium 138  Recent Lipid Panel    Component Value Date/Time   CHOL 190 01/02/2020 1226   TRIG 63  01/02/2020 1226   HDL 116 01/02/2020 1226   LDLCALC 62 01/02/2020 1226    Physical Exam:    VS:  BP (!) 168/84   Pulse 78   Ht 5\' 3"  (1.6 m)   Wt 234 lb 6.4 oz (106.3 kg)   SpO2 99%   BMI 41.52 kg/m     Wt Readings from Last 3 Encounters:  01/19/20 234 lb 6.4 oz (106.3 kg)  01/02/20 231 lb (104.8 kg)  12/27/19  235 lb 6.4 oz (106.8 kg)     GEN:  Well nourished, well developed in no acute distress HEENT: Normal NECK: No JVD; No carotid bruits LYMPHATICS: No lymphadenopathy CARDIAC: RR , soft systolic murmur  RESPIRATORY:  Clear to auscultation without rales, wheezing or rhonchi  ABDOMEN: Soft, non-tender, non-distended MUSCULOSKELETAL:  No edema; No deformity  SKIN: Warm and dry NEUROLOGIC:  Alert and oriented x 3 PSYCHIATRIC:  Normal affect   ASSESSMENT:    1. Regional wall motion abnormality of heart   2. Chest pain, unspecified type    PLAN:    In order of problems listed above:  1. Chest pressure/palpitations: It is difficult to tell if this is a cardiac issue.  She does have a history of obesity.  She has chest pressure.  She was seen in the emergency room last night and her troponin levels were negative.  She does have hypokinesis of the inferior base.  Because of this echo abnormality we will get a coronary CT angiogram.  I will give her a prescription for propranolol to take on an as-needed basis to help with these chest pains and palpitations.  I am comforted by the fact that her troponin levels were negative in the emergency room last night even while she had been having chest pain for hours.  I will follow up with her in 6 to 8 weeks for follow-up visit.   Medication Adjustments/Labs and Tests Ordered: Current medicines are reviewed at length with the patient today.  Concerns regarding medicines are outlined above.  Orders Placed This Encounter  Procedures  . CT CORONARY MORPH W/CTA COR W/SCORE W/CA W/CM &/OR WO/CM  . CT CORONARY FRACTIONAL FLOW RESERVE  DATA PREP  . CT CORONARY FRACTIONAL FLOW RESERVE FLUID ANALYSIS  . Basic Metabolic Panel (BMET)   Meds ordered this encounter  Medications  . propranolol (INDERAL) 10 MG tablet    Sig: Take 1 tablet (10 mg total) by mouth 4 (four) times daily as needed (palpitations/fast heart rate).    Dispense:  60 tablet    Refill:  11  . metoprolol tartrate (LOPRESSOR) 100 MG tablet    Sig: Take 1 pill (100 mg) 2 hours before your Coronary CT    Dispense:  1 tablet    Refill:  0    Patient Instructions  Medication Instructions:  Your physician has recommended you make the following change in your medication:  START Propranolol 10 mg up to 4 times per day as needed palpitations  *If you need a refill on your cardiac medications before your next appointment, please call your pharmacy*  Lab Work: Your physician recommends that you return for lab work in: 1 week before your coronary CT for basic metabolic panel  If you have labs (blood work) drawn today and your tests are completely normal, you will receive your results only by: Marland Kitchen MyChart Message (if you have MyChart) OR . A paper copy in the mail If you have any lab test that is abnormal or we need to change your treatment, we will call you to review the results.  Testing/Procedures: Your cardiac CT will be scheduled at one of the below locations:   [facility] [address] [place], [place] [address] [phone]  [place] [address] [place], [place] [address] [phone]  If scheduled at [facility], please arrive at the [facility] main entrance of [facility] 30-45 minutes prior to  test start time. Proceed to the [facility] Radiology Department (first floor) to check-in and test prep.  If scheduled at [facility], please arrive 15 mins early for check-in and test prep.  Please follow these instructions carefully  (unless otherwise directed):  Hold all erectile dysfunction medications at least 3 days (72 hrs) prior to test.  On the Night Before the Test: . Be sure to Drink plenty of water. . Do not consume any caffeinated/decaffeinated beverages or chocolate 12 hours prior to your test. . Do not take any antihistamines 12 hours prior to your test. . If the patient has contrast allergy: ? Patient will need a prescription for Prednisone and very clear instructions (as follows): 1. Prednisone 50 mg - take 13 hours prior to test 2. Take another Prednisone 50 mg 7 hours prior to test 3. Take another Prednisone 50 mg 1 hour prior to test 4. Take Benadryl 50 mg 1 hour prior to test . Patient must complete all four doses of above prophylactic medications. . Patient will need a ride after test due to Benadryl.  On the Day of the Test: . Drink plenty of water. Do not drink any water within one hour of the test. . Do not eat any food 4 hours prior to the test. . You may take your regular medications prior to the test.  . Take metoprolol (Lopressor) two hours prior to test. . HOLD Furosemide/Hydrochlorothiazide morning of the test. . FEMALES- please wear underwire-free bra if available       After the Test: . Drink plenty of water. . After receiving IV contrast, you may experience a mild flushed feeling. This is normal. . On occasion, you may experience a mild rash up to 24 hours after the test. This is not dangerous. If this occurs, you can take Benadryl 25 mg and increase your fluid intake. . If you experience trouble breathing, this can be serious. If it is severe call 911 IMMEDIATELY. If it is mild, please call our office. . If you take any of these medications: Glipizide/Metformin, Avandament, Glucavance, please do not take 48 hours after completing test unless otherwise instructed.   Once we have confirmed authorization from your insurance company, we will call you to set up a date and time for  your test.   For non-scheduling related questions, please contact the cardiac imaging nurse navigator should you have any questions/concerns: Marchia Bond, RN Navigator Cardiac Imaging [facility] [phone] Office     Follow-Up: At [facility], you and your health needs are our priority.  As part of our continuing mission to provide you with exceptional heart care, we have created designated Provider Care Teams.  These Care Teams include your primary Cardiologist (physician) and Advanced Practice Providers (APPs -  Physician Assistants and Nurse Practitioners) who all work together to provide you with the care you need, when you need it.  Your next appointment:   6 week(s) on March 12 at 8:40 am  The format for your next appointment:   In Person  Provider:   Mertie Moores, MD      Signed, Mertie Moores, MD  01/19/2020 6:23 PM    [place]

## 2020-01-19 NOTE — Patient Instructions (Addendum)
Medication Instructions:  Your physician has recommended you make the following change in your medication:  START Propranolol 10 mg up to 4 times per day as needed palpitations  *If you need a refill on your cardiac medications before your next appointment, please call your pharmacy*  Lab Work: Your physician recommends that you return for lab work in: 1 week before your coronary CT for basic metabolic panel  If you have labs (blood work) drawn today and your tests are completely normal, you will receive your results only by: Marland Kitchen MyChart Message (if you have MyChart) OR . A paper copy in the mail If you have any lab test that is abnormal or we need to change your treatment, we will call you to review the results.  Testing/Procedures: Your cardiac CT will be scheduled at one of the below locations:   [facility] [address] [place], [place] [address] (336) [place] [address] [place], [place] [address] [phone]  If scheduled at [facility], please arrive at the [facility] main entrance of [facility] 30-45 minutes prior to test start time. Proceed to the [facility] Radiology Department (first floor) to check-in and test prep.  If scheduled at [facility], please arrive 15 mins early for check-in and test prep.  Please follow these instructions carefully (unless otherwise directed):  Hold all erectile dysfunction medications at least 3 days (72 hrs) prior to test.  On the Night Before the Test: . Be sure to Drink plenty of water. . Do not consume any caffeinated/decaffeinated beverages or chocolate 12 hours prior to your test. . Do not take any antihistamines 12 hours prior to your test. . If the patient has contrast allergy: ? Patient will need a prescription for Prednisone and very clear instructions (as follows): 1. Prednisone 50 mg - take 13 hours  prior to test 2. Take another Prednisone 50 mg 7 hours prior to test 3. Take another Prednisone 50 mg 1 hour prior to test 4. Take Benadryl 50 mg 1 hour prior to test . Patient must complete all four doses of above prophylactic medications. . Patient will need a ride after test due to Benadryl.  On the Day of the Test: . Drink plenty of water. Do not drink any water within one hour of the test. . Do not eat any food 4 hours prior to the test. . You may take your regular medications prior to the test.  . Take metoprolol (Lopressor) two hours prior to test. . HOLD Furosemide/Hydrochlorothiazide morning of the test. . FEMALES- please wear underwire-free bra if available       After the Test: . Drink plenty of water. . After receiving IV contrast, you may experience a mild flushed feeling. This is normal. . On occasion, you may experience a mild rash up to 24 hours after the test. This is not dangerous. If this occurs, you can take Benadryl 25 mg and increase your fluid intake. . If you experience trouble breathing, this can be serious. If it is severe call 911 IMMEDIATELY. If it is mild, please call our office. . If you take any of these medications: Glipizide/Metformin, Avandament, Glucavance, please do not take 48 hours after completing test unless otherwise instructed.   Once we have confirmed authorization from your insurance company, we will call you to set up a date and time for your test.   For non-scheduling related  questions, please contact the cardiac imaging nurse navigator should you have any questions/concerns: Marchia Bond, RN Navigator Cardiac Imaging [facility] [phone] Office     Follow-Up: At [facility], you and your health needs are our priority.  As part of our continuing mission to provide you with exceptional heart care, we have created designated Provider Care Teams.  These Care Teams include your primary Cardiologist  (physician) and Advanced Practice Providers (APPs -  Physician Assistants and Nurse Practitioners) who all work together to provide you with the care you need, when you need it.  Your next appointment:   6 week(s) on March 12 at 8:40 am  The format for your next appointment:   In Person  Provider:   Mertie Moores, MD

## 2020-01-23 ENCOUNTER — Encounter: Payer: Self-pay | Admitting: Family Medicine

## 2020-01-26 ENCOUNTER — Encounter (INDEPENDENT_AMBULATORY_CARE_PROVIDER_SITE_OTHER): Payer: Self-pay | Admitting: Family Medicine

## 2020-01-26 ENCOUNTER — Other Ambulatory Visit: Payer: Self-pay

## 2020-01-26 ENCOUNTER — Ambulatory Visit (INDEPENDENT_AMBULATORY_CARE_PROVIDER_SITE_OTHER): Payer: BC Managed Care – PPO | Admitting: Family Medicine

## 2020-01-26 VITALS — BP 139/78 | HR 61 | Temp 97.8°F | Ht 63.0 in | Wt 227.0 lb

## 2020-01-26 DIAGNOSIS — F418 Other specified anxiety disorders: Secondary | ICD-10-CM

## 2020-01-26 DIAGNOSIS — R0789 Other chest pain: Secondary | ICD-10-CM

## 2020-01-26 DIAGNOSIS — I10 Essential (primary) hypertension: Secondary | ICD-10-CM | POA: Diagnosis not present

## 2020-01-26 DIAGNOSIS — Z6841 Body Mass Index (BMI) 40.0 and over, adult: Secondary | ICD-10-CM

## 2020-01-26 MED ORDER — CLONAZEPAM 0.5 MG PO TABS: ORAL_TABLET | ORAL | 0 refills | Status: DC

## 2020-01-26 NOTE — Progress Notes (Signed)
Chief Complaint:   OBESITY Vanessa Copeland is here to discuss her progress with her obesity treatment plan along with follow-up of her obesity related diagnoses. Vanessa Copeland is on the Category 3 Plan and states she is following her eating plan approximately 80% of the time. Vanessa Copeland states she is walking for 25 minutes 5 times per week.  Today's visit was #: 8 Starting weight: 256 lbs Starting date: 09/06/2019 Today's weight: 227 lbs Today's date: 01/26/2020 Total lbs lost to date: 29 lbs Total lbs lost since last in-office visit: 4 lbs  Interim History: Vanessa Copeland saw Cardiology regarding the wall motion abnormality and a CT scan was ordered.  She is very anxious about it, and she is not expecting to have the CT until the end of the month.  She is having significant anxiety and panic attacks.  Subjective:   1. Essential hypertension Review: taking medications as instructed, no medication side effects noted, no chest pain on exertion, no dyspnea on exertion, no swelling of ankles.  Vanessa Copeland says she will start the propranolol three times daily as she feels better when she takes it.    BP Readings from Last 3 Encounters:  01/26/20 139/78  01/19/20 (!) 168/84  01/18/20 135/79   2. Atypical chest pain Vanessa Copeland has been having some atypical chest pain.  She is being followed by Cardiology.  3. Situational anxiety I reassured the patient and reviewed red flags with her.  Assessment/Plan:   1. Essential hypertension Vanessa Copeland is working on healthy weight loss and exercise to improve blood pressure control. We will watch for signs of hypotension as she continues her lifestyle modifications.  Vanessa Copeland should increase her water intake, decrease her salt intake, and decrease her stress level.  Will continue to monitor.  2. Atypical chest pain As above.  Will continue to monitor.  3. Situational anxiety Will provide short course of clonazepam for situational anxiety.  Orders - clonazePAM (KLONOPIN) 0.5 MG  tablet; Take 1 to 2 tablets (0.5 to 1 mg) once or twice daily as needed for anxiety  Dispense: 20 tablet; Refill: 0  4. Class 3 severe obesity with serious comorbidity and body mass index (BMI) of 40.0 to 44.9 in adult, unspecified obesity type (HCC) Vanessa Copeland is currently in the action stage of change. As such, her goal is to continue with weight loss efforts. She has agreed to the Category 3 Plan.   Exercise goals: As tolerated.  Behavioral modification strategies: decreasing simple carbohydrates, increasing water intake and decreasing sodium intake.  Vanessa Copeland has agreed to follow-up with our clinic in 2 weeks. She was informed of the importance of frequent follow-up visits to maximize her success with intensive lifestyle modifications for her multiple health conditions.   Objective:   Blood pressure 139/78, pulse 61, temperature 97.8 F (36.6 C), temperature source Oral, height 5\' 3"  (1.6 m), weight 227 lb (103 kg), SpO2 99 %. Body mass index is 40.21 kg/m.  General: Cooperative, alert, well developed, in no acute distress. HEENT: Conjunctivae and lids unremarkable. Cardiovascular: Regular rhythm.  Lungs: Normal work of breathing. Neurologic: No focal deficits.   Lab Results  Component Value Date   CREATININE 0.71 01/18/2020   BUN 16 01/18/2020   NA 138 01/18/2020   K 3.5 01/18/2020   CL 103 01/18/2020   CO2 25 01/18/2020   Lab Results  Component Value Date   ALT 19 12/27/2019   AST 19 12/27/2019   ALKPHOS 78 12/27/2019   BILITOT 0.6 12/27/2019   Lab  Results  Component Value Date   HGBA1C 5.1 01/02/2020   HGBA1C 5.6 09/06/2019   HGBA1C 5.7 (H) 05/04/2019   Lab Results  Component Value Date   INSULIN 14.6 09/06/2019   Lab Results  Component Value Date   TSH 2.000 12/27/2019   Lab Results  Component Value Date   CHOL 190 01/02/2020   HDL 116 01/02/2020   LDLCALC 62 01/02/2020   TRIG 63 01/02/2020   Lab Results  Component Value Date   WBC 7.0 01/18/2020    HGB 11.7 (L) 01/18/2020   HCT 36.3 01/18/2020   MCV 89.9 01/18/2020   PLT 319 01/18/2020   Lab Results  Component Value Date   IRON 57 12/27/2019   TIBC 321 12/27/2019   FERRITIN 51 12/27/2019   Attestation Statements:   Reviewed by clinician on day of visit: allergies, medications, problem list, medical history, surgical history, family history, social history, and previous encounter notes.  I performed a medically necessary appropriate examination and/or evaluation. I discussed the assessment and treatment plan with the patient. The patient was provided an opportunity to ask questions and all were answered. The patient agreed with the plan and demonstrated an understanding of the instructions. Clinical information was updated and documented in the EMR. Time spent on visit including pre-visit chart review and post-visit care was 45 minutes.  I, Water quality scientist, CMA, am acting as Location manager for [employer], DO.  I have reviewed the above documentation for accuracy and completeness, and I agree with the above. Briscoe Deutscher, DO

## 2020-01-30 ENCOUNTER — Encounter (HOSPITAL_COMMUNITY): Payer: Self-pay

## 2020-01-30 ENCOUNTER — Telehealth (HOSPITAL_COMMUNITY): Payer: Self-pay | Admitting: Emergency Medicine

## 2020-01-30 NOTE — Telephone Encounter (Signed)
Reaching out to patient to offer assistance regarding upcoming cardiac imaging study; pt verbalizes understanding of appt date/time, parking situation and where to check in, pre-test NPO status and medications ordered, and verified current allergies; name and call back number provided for further questions should they arise Elnora Quizon RN Navigator Cardiac Imaging [facility] [phone] office [phone] cell 

## 2020-01-31 ENCOUNTER — Other Ambulatory Visit: Payer: Self-pay

## 2020-01-31 ENCOUNTER — Ambulatory Visit (HOSPITAL_COMMUNITY)
Admission: RE | Admit: 2020-01-31 | Discharge: 2020-01-31 | Disposition: A | Discharge status: Home / Self Care | Payer: BC Managed Care – PPO | Source: Ambulatory Visit | Attending: Cardiovascular Disease | Admitting: Cardiovascular Disease

## 2020-01-31 ENCOUNTER — Encounter (HOSPITAL_COMMUNITY): Payer: Self-pay

## 2020-01-31 DIAGNOSIS — Z006 Encounter for examination for normal comparison and control in clinical research program: Secondary | ICD-10-CM

## 2020-01-31 DIAGNOSIS — R079 Chest pain, unspecified: Secondary | ICD-10-CM | POA: Diagnosis not present

## 2020-01-31 MED ORDER — NITROGLYCERIN 0.4 MG SL SUBL
SUBLINGUAL_TABLET | SUBLINGUAL | Status: AC
  Filled 2020-01-31: qty 2

## 2020-01-31 MED ORDER — NITROGLYCERIN 0.4 MG SL SUBL
0.8000 mg | SUBLINGUAL_TABLET | Freq: Once | SUBLINGUAL | Status: AC
  Administered 2020-01-31: 0.8 mg via SUBLINGUAL

## 2020-01-31 MED ORDER — IOHEXOL 350 MG/ML SOLN
80.0000 mL | Freq: Once | INTRAVENOUS | Status: AC | PRN
  Administered 2020-01-31: 80 mL via INTRAVENOUS

## 2020-01-31 NOTE — Research (Signed)
Cadfem Informed Consent    Patient Name: Vanessa Copeland   Subject met inclusion and exclusion criteria.  The informed consent form, study requirements and expectations were reviewed with the subject and questions and concerns were addressed prior to the signing of the consent form.  The subject verbalized understanding of the trail requirements.  The subject agreed to participate in the CADFEM trial and signed the informed consent.  The informed consent was obtained prior to performance of any protocol-specific procedures for the subject.  A copy of the signed informed consent was given to the subject and a copy was placed in the subject's medical record.   Neva Seat ]

## 2020-02-01 ENCOUNTER — Telehealth: Payer: Self-pay | Admitting: Cardiovascular Disease

## 2020-02-01 NOTE — Patient Instructions (Addendum)
You received the Tdap vaccine today.  Avoid getting a Covid vaccine for at least 2 weeks.  It is not recommended that you have any other vaccine within 2 weeks of getting your first and second Covid vaccines  You can call to schedule your appointment with the psychiatrist or counselor. A few offices are listed below for you to call.    [facility]  (across from [facility])  [facility] [address] [place], [place] [address] [address], [place], [place] [address]  Phone: [phone]   [facility] [place], [place] [address]  Phone: [phone]   Preventive Care 53-29 Years Old, Female Preventive care refers to visits with your health care provider and lifestyle choices that can promote health and wellness. This includes:  A yearly physical exam. This may also be called an annual well check.  Regular dental visits and eye exams.  Immunizations.  Screening for certain conditions.  Healthy lifestyle choices, such as eating a healthy diet, getting regular exercise, not using drugs or products that contain nicotine and tobacco, and limiting alcohol use. What can I expect for my preventive care visit? Physical exam Your health care provider will check your:  Height and weight. This may be used to calculate body mass index (BMI), which tells if you are at a healthy weight.  Heart rate and blood pressure.  Skin for abnormal spots. Counseling Your health care provider may ask you questions about your:  Alcohol, tobacco, and drug use.  Emotional well-being.  Home and relationship well-being.  Sexual activity.  Eating habits.  Work and work Statistician.  Method of birth control.  Menstrual cycle.  Pregnancy history. What immunizations do I  need?  Influenza (flu) vaccine  This is recommended every year. Tetanus, diphtheria, and pertussis (Tdap) vaccine  You may need a Td booster every 10 years. Varicella (chickenpox) vaccine  You may need this if you have not been vaccinated. Zoster (shingles) vaccine  You may need this after age 53. Measles, mumps, and rubella (MMR) vaccine  You may need at least one dose of MMR if you were born in 1957 or later. You may also need a second dose. Pneumococcal conjugate (PCV13) vaccine  You may need this if you have certain conditions and were not previously vaccinated. Pneumococcal polysaccharide (PPSV23) vaccine  You may need one or two doses if you smoke cigarettes or if you have certain conditions. Meningococcal conjugate (MenACWY) vaccine  You may need this if you have certain conditions. Hepatitis A vaccine  You may need this if you have certain conditions or if you travel or work in places where you may be exposed to hepatitis A. Hepatitis B vaccine  You may need this if you have certain conditions or if you travel or work in places where you may be exposed to hepatitis B. Haemophilus influenzae type b (Hib) vaccine  You may need this if you have certain conditions. Human papillomavirus (HPV) vaccine  If recommended by your health care provider, you may need three doses over 6 months. You may receive vaccines as individual doses or as more than one vaccine together in one shot (combination vaccines). Talk with your health care provider about the risks and benefits of combination vaccines. What tests do I need?  Blood tests  Lipid and cholesterol levels. These may be checked every 5 years, or more frequently if you are over 53 years old.  Hepatitis C test.  Hepatitis B test. Screening  Lung cancer screening. You may have this screening every year starting at age 53 if you have a 30-pack-year history of smoking and currently smoke or have quit within the past 15  years.  Colorectal cancer screening. All adults should have this screening starting at age 53 and continuing until age 48. Your health care provider may recommend screening at age 17 if you are at increased risk. You will have tests every 1-10 years, depending on your results and the type of screening test.  Diabetes screening. This is done by checking your blood sugar (glucose) after you have not eaten for a while (fasting). You may have this done every 1-3 years.  Mammogram. This may be done every 1-2 years. Talk with your health care provider about when you should start having regular mammograms. This may depend on whether you have a family history of breast cancer.  BRCA-related cancer screening. This may be done if you have a family history of breast, ovarian, tubal, or peritoneal cancers.  Pelvic exam and Pap test. This may be done every 3 years starting at age 53. Starting at age 53, this may be done every 5 years if you have a Pap test in combination with an HPV test. Other tests  Sexually transmitted disease (STD) testing.  Bone density scan. This is done to screen for osteoporosis. You may have this scan if you are at high risk for osteoporosis. Follow these instructions at home: Eating and drinking  Eat a diet that includes fresh fruits and vegetables, whole grains, lean protein, and low-fat dairy.  Take vitamin and mineral supplements as recommended by your health care provider.  Do not drink alcohol if: ? Your health care provider tells you not to drink. ? You are pregnant, may be pregnant, or are planning to become pregnant.  If you drink alcohol: ? Limit how much you have to 0-1 drink a day. ? Be aware of how much alcohol is in your drink. In the [place] one drink equals one 12 oz bottle of beer (355 mL), one 5 oz glass of wine (148 mL), or one 1 oz glass of hard liquor (44 mL). Lifestyle  Take daily care of your teeth and gums.  Stay active. Exercise for at least 30  minutes on 5 or more days each week.  Do not use any products that contain nicotine or tobacco, such as cigarettes, e-cigarettes, and chewing tobacco. If you need help quitting, ask your health care provider.  If you are sexually active, practice safe sex. Use a condom or other form of birth control (contraception) in order to prevent pregnancy and STIs (sexually transmitted infections).  If told by your health care provider, take low-dose aspirin daily starting at age 38. What's next?  Visit your health care provider once a year for a well check visit.  Ask your health care provider how often you should have your eyes and teeth checked.  Stay up to date on all vaccines. This information is not intended to replace advice given to you by your health care provider. Make sure you discuss any questions you have with your health care provider. Document Revised: 08/19/2018 Document Reviewed: 08/19/2018 Elsevier Patient Education  2020 [employer].

## 2020-02-01 NOTE — Telephone Encounter (Signed)
New Message  Patient is calling in about CT results. Please give patient a call back.

## 2020-02-01 NOTE — Telephone Encounter (Signed)
The patient has been notified of the CT result and verbalized understanding.  All questions (if any) were answered. Frederik Schmidt, RN 02/01/2020 2:01 PM
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
## 2020-02-02 ENCOUNTER — Encounter: Payer: Self-pay | Admitting: Family Medicine

## 2020-02-02 ENCOUNTER — Ambulatory Visit (INDEPENDENT_AMBULATORY_CARE_PROVIDER_SITE_OTHER): Payer: BC Managed Care – PPO | Admitting: Family Medicine

## 2020-02-02 VITALS — BP 132/70 | HR 70 | Temp 98.2°F | Ht 63.75 in | Wt 233.2 lb

## 2020-02-02 DIAGNOSIS — I10 Essential (primary) hypertension: Secondary | ICD-10-CM

## 2020-02-02 DIAGNOSIS — K760 Fatty (change of) liver, not elsewhere classified: Secondary | ICD-10-CM | POA: Diagnosis not present

## 2020-02-02 DIAGNOSIS — Z6841 Body Mass Index (BMI) 40.0 and over, adult: Secondary | ICD-10-CM

## 2020-02-02 DIAGNOSIS — Z23 Encounter for immunization: Secondary | ICD-10-CM | POA: Diagnosis not present

## 2020-02-02 DIAGNOSIS — F419 Anxiety disorder, unspecified: Secondary | ICD-10-CM | POA: Insufficient documentation

## 2020-02-02 DIAGNOSIS — Z0000 Encounter for general adult medical examination without abnormal findings: Secondary | ICD-10-CM | POA: Diagnosis not present

## 2020-02-02 MED ORDER — EPINEPHRINE 0.3 MG/0.3ML IJ SOAJ: 0.3000 mg | INTRAMUSCULAR | 0 refills | Status: DC | PRN

## 2020-02-03 ENCOUNTER — Other Ambulatory Visit: Payer: Self-pay | Admitting: Internal Medicine

## 2020-02-03 MED ORDER — EPINEPHRINE 0.3 MG/0.3ML IJ SOAJ: 0.3000 mg | INTRAMUSCULAR | 1 refills | Status: DC | PRN

## 2020-02-07 ENCOUNTER — Encounter: Payer: Self-pay | Admitting: Family Medicine

## 2020-02-07 DIAGNOSIS — Z1211 Encounter for screening for malignant neoplasm of colon: Secondary | ICD-10-CM

## 2020-02-08 ENCOUNTER — Encounter: Payer: Self-pay | Admitting: Gastroenterology

## 2020-02-08 ENCOUNTER — Encounter (INDEPENDENT_AMBULATORY_CARE_PROVIDER_SITE_OTHER): Payer: Self-pay | Admitting: Family Medicine

## 2020-02-08 ENCOUNTER — Ambulatory Visit (INDEPENDENT_AMBULATORY_CARE_PROVIDER_SITE_OTHER): Payer: BC Managed Care – PPO | Admitting: Family Medicine

## 2020-02-08 ENCOUNTER — Other Ambulatory Visit: Payer: Self-pay

## 2020-02-08 VITALS — BP 122/77 | HR 68 | Temp 97.8°F | Ht 63.0 in | Wt 230.0 lb

## 2020-02-08 DIAGNOSIS — F419 Anxiety disorder, unspecified: Secondary | ICD-10-CM | POA: Diagnosis not present

## 2020-02-08 DIAGNOSIS — Z9189 Other specified personal risk factors, not elsewhere classified: Secondary | ICD-10-CM

## 2020-02-08 DIAGNOSIS — Z6841 Body Mass Index (BMI) 40.0 and over, adult: Secondary | ICD-10-CM

## 2020-02-08 DIAGNOSIS — E559 Vitamin D deficiency, unspecified: Secondary | ICD-10-CM | POA: Diagnosis not present

## 2020-02-08 MED ORDER — VITAMIN D (ERGOCALCIFEROL) 1.25 MG (50000 UNIT) PO CAPS: 50000.0000 [IU] | ORAL_CAPSULE | ORAL | 0 refills | Status: DC

## 2020-02-08 MED ORDER — ESCITALOPRAM OXALATE 10 MG PO TABS: 10.0000 mg | ORAL_TABLET | Freq: Every morning | ORAL | 0 refills | Status: DC

## 2020-02-08 NOTE — Progress Notes (Signed)
Chief Complaint:   OBESITY Vanessa Copeland is here to discuss her progress with her obesity treatment plan along with follow-up of her obesity related diagnoses. Vanessa Copeland is on the Category 3 Plan and states she is following her eating plan approximately 70% of the time. Vanessa Copeland states she is walking for 25 minutes 5 times per week.  Today's visit was #: 10 Starting weight: 256 lbs Starting date: 09/06/2019 Today's weight: 230 lbs Today's date: 02/08/2020 Total lbs lost to date: 26 Total lbs lost since last in-office visit: 0  Interim History: Vanessa Copeland has struggled with increased stress and comfort eating since her last visit. She is still being mindful of her food choices, but she is deviating more from her plan and is frustrated with herself.  Subjective:   1. Anxiety Vanessa Copeland has anxiety with emotional eating. She was given Klonopin and she requests a refill. She notes ongoing stress and this is affecting her eating.  2. Vitamin D deficiency Vanessa Copeland's Vit D level is stable, but not yet at goal.  3. At risk for malnutrition Vanessa Copeland is at increased risk for malnutrition due to inadequate protein intake.   Assessment/Plan:   1. Anxiety Behavior modification techniques were discussed today to help Vanessa Copeland deal with her anxiety. Vanessa Copeland agreed to discontinue Klonopin and start Lexapro 10 mg daily with no refill. Vanessa Copeland was educated that medications like Klonopin causes weight gain, and she would do better on Lexapro long term). Orders and follow up as documented in patient record.   - escitalopram (LEXAPRO) 10 MG tablet; Take 1 tablet (10 mg total) by mouth every morning.  Dispense: 30 tablet; Refill: 0  2. Vitamin D deficiency Low Vitamin D level contributes to fatigue and are associated with obesity, breast, and colon cancer. We will refill prescription Vitamin D for 1 month. Vanessa Copeland will follow-up for routine testing of Vitamin D, at least 2-3 times per year to avoid over-replacement. We will  recheck labs in 1-2 months.  - Vitamin D, Ergocalciferol, (DRISDOL) 1.25 MG (50000 UNIT) CAPS capsule; Take 1 capsule (50,000 Units total) by mouth every 7 (seven) days.  Dispense: 4 capsule; Refill: 0  3. At risk for malnutrition Vanessa Copeland was given approximately 15 minutes of counseling today regarding prevention of malnutrition. Vanessa Copeland was advised that having bariatric surgery increases her risk for anemia, malnutrition, and vitamin deficiencies.   4. Class 3 severe obesity with serious comorbidity and body mass index (BMI) of 40.0 to 44.9 in adult, unspecified obesity type (HCC) Vanessa Copeland is currently in the action stage of change. As such, her goal is to continue with weight loss efforts. She has agreed to the Category 3 Plan.   Exercise goals: Vanessa Copeland is to continue her current exercise regimen as is.  Behavioral modification strategies: increasing lean protein intake and emotional eating strategies.  Vanessa Copeland has agreed to follow-up with our clinic in 3 to 4 weeks. She was informed of the importance of frequent follow-up visits to maximize her success with intensive lifestyle modifications for her multiple health conditions.   Objective:   Blood pressure 122/77, pulse 68, temperature 97.8 F (36.6 C), temperature source Oral, height 5\' 3"  (1.6 m), weight 230 lb (104.3 kg), SpO2 99 %. Body mass index is 40.74 kg/m.  General: Cooperative, alert, well developed, in no acute distress. HEENT: Conjunctivae and lids unremarkable. Cardiovascular: Regular rhythm.  Lungs: Normal work of breathing. Neurologic: No focal deficits.   Lab Results  Component Value Date   CREATININE 0.71 01/18/2020  BUN 16 01/18/2020   NA 138 01/18/2020   K 3.5 01/18/2020   CL 103 01/18/2020   CO2 25 01/18/2020   Lab Results  Component Value Date   ALT 19 12/27/2019   AST 19 12/27/2019   ALKPHOS 78 12/27/2019   BILITOT 0.6 12/27/2019   Lab Results  Component Value Date   HGBA1C 5.1 01/02/2020   HGBA1C  5.6 09/06/2019   HGBA1C 5.7 (H) 05/04/2019   Lab Results  Component Value Date   INSULIN 14.6 09/06/2019   Lab Results  Component Value Date   TSH 2.000 12/27/2019   Lab Results  Component Value Date   CHOL 190 01/02/2020   HDL 116 01/02/2020   LDLCALC 62 01/02/2020   TRIG 63 01/02/2020   Lab Results  Component Value Date   WBC 7.0 01/18/2020   HGB 11.7 (L) 01/18/2020   HCT 36.3 01/18/2020   MCV 89.9 01/18/2020   PLT 319 01/18/2020   Lab Results  Component Value Date   IRON 57 12/27/2019   TIBC 321 12/27/2019   FERRITIN 51 12/27/2019   Attestation Statements:   Reviewed by clinician on day of visit: allergies, medications, problem list, medical history, surgical history, family history, social history, and previous encounter notes.   I, Trixie Dredge, am acting as transcriptionist for Dennard Nip, MD.  I have reviewed the above documentation for accuracy and completeness, and I agree with the above. -  Dennard Nip, MD

## 2020-02-18 ENCOUNTER — Ambulatory Visit: Payer: BC Managed Care – PPO | Attending: Internal Medicine

## 2020-02-18 DIAGNOSIS — Z23 Encounter for immunization: Secondary | ICD-10-CM | POA: Insufficient documentation

## 2020-02-18 NOTE — Progress Notes (Signed)
   Covid-19 Vaccination Clinic  Name:  Vanessa Copeland    MRN: [id number] DOB: [birth year]  02/18/2020  [name] was observed post Covid-19 immunization for 38 minutes. Upon arrival into observation area she sat down and verbalized that she felt warm. 2:27PM fan was provided for comfort. Encouraged deep breathing and relaxation. Pt verbalized that she was diagnosed in January with anxiety and prescribed two medications; Lexapro 10mg  QAM and Propanolol 10mg  QID PRN for palpitations. B/P was 148/76, pulse 128, resp. 24. O2 sat 99% on room air. 2:35pm- vital signs reassessed; B/P 162/85, P-119, O2 sat 100%. Denied any Chest pain, pressure or tightness, nausea or dizziness. Pt. Verbalized that the warm sensation is decreasing. 2:38pm- Pt appeared more anxious, respirations increasing and she stated she felt more anxious, she took her 10mg  of Propanolol as she hadn't taken any today. 2:45pm- B/P 149/83, P-79, resp 20, O2 100% on room air, snack offered.2:50pm- Denies any dizziness, chest pain or anxiety. Pt was more vocal and carrying out conversation with staff that was at her side. 2:55pm- B/P 149/73, P-67, states she is feeling back to her baseline. 3:05pm- Pt ambulated in observation areas without any concerns. Educated her on potential risk of Pfizer vaccine and what adverse reactions she needs to monitor for and when to call 911. 3:11pm- Discharged from observation clinic with a nurse who walked her to her car.  She was provided with Vaccine Information Sheet and instruction to access the V-Safe system.    [name] was instructed to call 911 with any severe reactions post vaccine: Marland Kitchen Difficulty breathing  . Swelling of your face and throat  . A fast heartbeat  . A bad rash all over your body  . Dizziness and weakness    Immunizations Administered    Name Date Dose VIS Date Route   Pfizer COVID-19 Vaccine 02/18/2020  2:20 PM 0.3 mL 12/02/2019 Intramuscular   Manufacturer: [place]   Lot: [id number]     [place]: [id number]

## 2020-02-21 ENCOUNTER — Other Ambulatory Visit: Payer: Self-pay

## 2020-02-21 ENCOUNTER — Ambulatory Visit (INDEPENDENT_AMBULATORY_CARE_PROVIDER_SITE_OTHER): Payer: BC Managed Care – PPO | Admitting: Cardiovascular Disease

## 2020-02-21 ENCOUNTER — Encounter: Payer: Self-pay | Admitting: Cardiovascular Disease

## 2020-02-21 ENCOUNTER — Encounter (INDEPENDENT_AMBULATORY_CARE_PROVIDER_SITE_OTHER): Payer: Self-pay | Admitting: Family Medicine

## 2020-02-21 ENCOUNTER — Encounter: Payer: Self-pay | Admitting: *Deleted

## 2020-02-21 VITALS — BP 134/78 | HR 62 | Ht 63.0 in | Wt 231.0 lb

## 2020-02-21 DIAGNOSIS — M4600 Spinal enthesopathy, site unspecified: Secondary | ICD-10-CM

## 2020-02-21 DIAGNOSIS — M2428 Disorder of ligament, vertebrae: Secondary | ICD-10-CM | POA: Insufficient documentation

## 2020-02-21 DIAGNOSIS — R002 Palpitations: Secondary | ICD-10-CM | POA: Diagnosis not present

## 2020-02-21 NOTE — Patient Instructions (Signed)
Medication Instructions:  Your physician recommends that you continue on your current medications as directed. Please refer to the Current Medication list given to you today.  *If you need a refill on your cardiac medications before your next appointment, please call your pharmacy*   Lab Work: None Ordered If you have labs (blood work) drawn today and your tests are completely normal, you will receive your results only by: Marland Kitchen MyChart Message (if you have MyChart) OR . A paper copy in the mail If you have any lab test that is abnormal or we need to change your treatment, we will call you to review the results.   Testing/Procedures: Your physician has recommended that you wear an event monitor. Event monitors are medical devices that record the heart's electrical activity. Doctors most often [place] these monitors to diagnose arrhythmias. Arrhythmias are problems with the speed or rhythm of the heartbeat. The monitor is a small, portable device. You can wear one while you do your normal daily activities. This is usually used to diagnose what is causing palpitations/syncope (passing out).     Follow-Up: At [facility], you and your health needs are our priority.  As part of our continuing mission to provide you with exceptional heart care, we have created designated Provider Care Teams.  These Care Teams include your primary Cardiologist (physician) and Advanced Practice Providers (APPs -  Physician Assistants and Nurse Practitioners) who all work together to provide you with the care you need, when you need it.  We recommend signing up for the patient portal called "MyChart".  Sign up information is provided on this After Visit Summary.  MyChart is used to connect with patients for Virtual Visits (Telemedicine).  Patients are able to view lab/test results, encounter notes, upcoming appointments, etc.  Non-urgent messages can be sent to your provider as well.   To learn more about what you can do  with MyChart, go to [email or web address].    Your next appointment:   1 year(s)  The format for your next appointment:   In Person  Provider:   You may see Mertie Moores, MD or one of the following Advanced Practice Providers on your designated Care Team:    Richardson Dopp, PA-C  [place], [place]  Daune Perch, [place]

## 2020-02-21 NOTE — Telephone Encounter (Signed)
Please advise 

## 2020-02-21 NOTE — Progress Notes (Signed)
Patient ID: Vanessa Copeland, female   DOB: 12-Apr-1966, 54 y.o.   MRN: [id number]  Patient enrolled for [employer] to ship a 30 day cardiac event monitor to her home.  Instructions sent to patient via My Chart message and will also be included in the monitor kit.

## 2020-02-21 NOTE — Progress Notes (Signed)
Cardiology Office Note:    Date:  02/21/2020   ID:  Vanessa Copeland, DOB Jun 26, 1966, MRN [id number]  PCP:  Vanessa Rm, NP-C  Cardiologist:  Vanessa Copeland  Electrophysiologist:  None   Referring MD: Vanessa Rm, NP-C   No chief complaint on file.   History of Present Illness:    Vanessa Copeland is a 54 y.o. female with a hx of chest pain.  She recently had an echocardiogram and was noticed to have some wall motion abnormalities.  We are asked to see her today by Vanessa Deutscher, MD for further evaluation of this abnormal wall motion.  She has been having some palpitation  Single isolated palps - like she was startled.  They come in waves.  Is in [facility]   Walks every day .   25 min a day .   Stopped last b/c she was scared of these palpitations/ pressure   She was seen in the emergency room last night.  Labs were normal.  Troponin was negative x2.  She was still having the episode of chest discomfort at that time.  The episodes may last as long as 10-15 minutes.   BP is normally ok She does not watch her salt.  Eats bacon on occasion .   Grilled chicken.   No soup   Non smoker  Mother has HTN Brother has HTN   February 21, 2020: Vanessa Copeland is seen back for follow-up visit of her chest pain.  Echocardiogram revealed normal left ventricular systolic function.  There is a question of inferior wall motion defect. Coronary CT angiogram was performed on January 31, 2020.  Coronary calcium score was 0.  She had normal coronary arteries.  She still has some episodes of tachypalpitations .  Takes propranolol as needed. ( several times each month )  One occurred after her 1st covid vaccine . Will place a monitor Has been walking .  Has lost several lbs  We discussed a target HR for her walking - established a goal HR of 130-140.  She   Past Medical History:  Diagnosis Date  . Allergy   . Anemia   . Anxiety   . Asthma    mild intermittent(when allergies are  flaring)  . Complication of anesthesia   . Food allergy    Shellfish  . H/O echocardiogram 12/2005   Trace MR, mild TR, nl L ventricular systolic function.  Marland Kitchen Heart murmur    since birth. Never has had any problems  . Hepatic steatosis    per CT  . History of kidney stones   . Kidney stones   . Morbid obesity ([place]) 04/29/2019  . Obesity, unspecified   . PONV (postoperative nausea and vomiting)   . Vitamin D deficiency 01/2010    Past Surgical History:  Procedure Laterality Date  . CESAREAN SECTION     x3  . CYSTOSCOPY WITH RETROGRADE PYELOGRAM, URETEROSCOPY AND STENT PLACEMENT Left 07/16/2018   Procedure: CYSTOSCOPY WITH RETROGRADE PYELOGRAM, URETEROSCOPY AND STENT PLACEMENT;  Surgeon: Cleon Gustin, MD;  Location: [facility];  Service: Urology;  Laterality: Left;  . EXTRACORPOREAL SHOCK WAVE LITHOTRIPSY Left 06/21/2018   Procedure: LEFT EXTRACORPOREAL SHOCK WAVE LITHOTRIPSY (ESWL);  Surgeon: Franchot Gallo, MD;  Location: [facility];  Service: Urology;  Laterality: Left;  . HOLMIUM LASER APPLICATION Left [id number]   Procedure: HOLMIUM LASER APPLICATION;  Surgeon: Cleon Gustin, MD;  Location: [facility];  Service: Urology;  Laterality: Left;  .  OOPHORECTOMY  2002   left due to cyst  . TUBAL LIGATION  1996    Current Medications: Current Meds  Medication Sig  . albuterol (VENTOLIN HFA) 108 (90 Base) MCG/ACT inhaler Inhale 2 puffs into the lungs every 6 (six) hours as needed for wheezing or shortness of breath.  . clonazePAM (KLONOPIN) 0.5 MG tablet Take 1 to 2 tablets (0.5 to 1 mg) once or twice daily as needed for anxiety  . EPINEPHrine 0.3 mg/0.3 mL IJ SOAJ injection Inject 0.3 mLs (0.3 mg total) into the muscle as needed for anaphylaxis.  Marland Kitchen escitalopram (LEXAPRO) 10 MG tablet Take 1 tablet (10 mg total) by mouth every morning.  Marland Kitchen ibuprofen (ADVIL) 800 MG tablet TAKE 1 TABLET(800 MG) BY MOUTH THREE TIMES DAILY  . levocetirizine (XYZAL) 5 MG tablet Take 1 tablet (5 mg total) by  mouth every evening.  . propranolol (INDERAL) 10 MG tablet Take 1 tablet (10 mg total) by mouth 4 (four) times daily as needed (palpitations/fast heart rate).  . Vitamin D, Ergocalciferol, (DRISDOL) 1.25 MG (50000 UNIT) CAPS capsule Take 1 capsule (50,000 Units total) by mouth every 7 (seven) days.     Allergies:   Shellfish allergy   Social History   Socioeconomic History  . Marital status: Married    Spouse name: Champaigne Kissee  . Number of children: 3  . Years of education: Not on file  . Highest education level: Not on file  Occupational History  . 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  Tobacco Use  . Smoking status: Never Smoker  . Smokeless tobacco: Never Used  Substance and Sexual Activity  . Alcohol use: No  . Drug use: No  . Sexual activity: Yes    Partners: Male    Birth control/protection: Surgical  Other Topics Concern  . Not on file  Social History Narrative   Lives with husband, 2/3 children.  (2 daughters).  Son lives in [place].  1 dog   Social Determinants of Health   Financial Resource Strain:   . Difficulty of Paying Living Expenses: Not on file  Food Insecurity:   . Worried About Charity fundraiser in the Last Year: Not on file  . Ran Out of Food in the Last Year: Not on file  Transportation Needs:   . Lack of Transportation (Medical): Not on file  . Lack of Transportation (Non-Medical): Not on file  Physical Activity:   . Days of Exercise per Week: Not on file  . Minutes of Exercise per Session: Not on file  Stress:   . Feeling of Stress : Not on file  Social Connections:   . Frequency of Communication with Friends and Family: Not on file  . Frequency of Social Gatherings with Friends and Family: Not on file  . Attends Religious Services: Not on file  . Active Member of Clubs or Organizations: Not on file  . Attends Archivist Meetings: Not on file  . Marital Status: Not on file     Family  History: The patient's family history includes Allergies in her daughter; Anemia in her mother; Anxiety disorder in her mother; Asthma in her brother, daughter, and daughter; Cancer in her father; Colon polyps in her mother; Hypertension in her brother, father, and mother; Kidney Stones in her father; Lung cancer (age of onset: 76) in her father. There is no history of Breast cancer, Colon cancer, Diabetes, or Heart disease.  ROS:   Please  see the history of present illness.     All other systems reviewed and are negative.  EKGs/Labs/Other Studies Reviewed:    The following studies were reviewed today:   EKG:     Recent Labs: 12/27/2019: ALT 19; TSH 2.000 01/18/2020: BUN 16; Creatinine, Ser 0.71; Hemoglobin 11.7; Platelets 319; Potassium 3.5; Sodium 138  Recent Lipid Panel    Component Value Date/Time   CHOL 190 01/02/2020 1226   TRIG 63 01/02/2020 1226   HDL 116 01/02/2020 1226   LDLCALC 62 01/02/2020 1226    Physical Exam:     Physical Exam: Blood pressure 134/78, pulse 62, height 5\' 3"  (1.6 m), weight 231 lb (104.8 kg), SpO2 99 %.  GEN:  Well nourished, well developed in no acute distress HEENT: Normal NECK: No JVD; No carotid bruits LYMPHATICS: No lymphadenopathy CARDIAC: RRR , no murmurs, rubs, gallops RESPIRATORY:  Clear to auscultation without rales, wheezing or rhonchi  ABDOMEN: Soft, non-tender, non-distended MUSCULOSKELETAL:  No edema; No deformity  SKIN: Warm and dry NEUROLOGIC:  Alert and oriented x 3   ASSESSMENT:    1. Palpitations    PLAN:    In order of problems listed above:  Chest pressure/palpitations: She continues to have episodes of palpitations.  Her coronary CT scan was normal which means that her chest pains are not cardiac in origin.  We will place a 30-day event monitor.  She continues to use propranolol.  We will have her see [place] in 1 year.  We will see her sooner if anything serious turns up on the event monitor.   Medication  Adjustments/Labs and Tests Ordered: Current medicines are reviewed at length with the patient today.  Concerns regarding medicines are outlined above.  Orders Placed This Encounter  Procedures  . Cardiac event monitor   No orders of the defined types were placed in this encounter.   Patient Instructions  Medication Instructions:  Your physician recommends that you continue on your current medications as directed. Please refer to the Current Medication list given to you today.  *If you need a refill on your cardiac medications before your next appointment, please call your pharmacy*   Lab Work: None Ordered If you have labs (blood work) drawn today and your tests are completely normal, you will receive your results only by: Marland Kitchen MyChart Message (if you have MyChart) OR . A paper copy in the mail If you have any lab test that is abnormal or we need to change your treatment, we will call you to review the results.   Testing/Procedures: Your physician has recommended that you wear an event monitor. Event monitors are medical devices that record the heart's electrical activity. Doctors most often [place] these monitors to diagnose arrhythmias. Arrhythmias are problems with the speed or rhythm of the heartbeat. The monitor is a small, portable device. You can wear one while you do your normal daily activities. This is usually used to diagnose what is causing palpitations/syncope (passing out).     Follow-Up: At [facility], you and your health needs are our priority.  As part of our continuing mission to provide you with exceptional heart care, we have created designated Provider Care Teams.  These Care Teams include your primary Cardiologist (physician) and Advanced Practice Providers (APPs -  Physician Assistants and Nurse Practitioners) who all work together to provide you with the care you need, when you need it.  We recommend signing up for the patient portal called "MyChart".  Sign up  information  is provided on this After Visit Summary.  MyChart is used to connect with patients for Virtual Visits (Telemedicine).  Patients are able to view lab/test results, encounter notes, upcoming appointments, etc.  Non-urgent messages can be sent to your provider as well.   To learn more about what you can do with MyChart, go to [email or web address].    Your next appointment:   1 year(s)  The format for your next appointment:   In Person  Provider:   You may see Mertie Moores, MD or one of the following Advanced Practice Providers on your designated Care Team:    Richardson Dopp, PA-C  Vin Selma, [place]  Daune Perch, [place]         Signed, Mertie Moores, MD  02/21/2020 3:16 PM    [place]

## 2020-02-23 NOTE — Telephone Encounter (Signed)
Patient states she has been taking escitalopram 10mg  since ov on 2/17 and is only using Klonopin only occasionally.  She had an episode where she thought the escitalopram was not working so sent the the above message.    She does feel the escitalopram is helping her some.  She just wasn't sure if it needed to be increased now or what until her appt on 03/07/2020.

## 2020-02-23 NOTE — Telephone Encounter (Signed)
Ok, yes, please continue the daily 10 mg dose and will consider increasing to 20 at her next visit.

## 2020-02-23 NOTE — Telephone Encounter (Signed)
Amy, I think there has been some confusion, can you call the patient and see what meds she was on on what she thinks she is tapering off of and what she is increasing? Thanks!

## 2020-02-24 ENCOUNTER — Telehealth: Payer: Self-pay | Admitting: Family Medicine

## 2020-02-24 ENCOUNTER — Encounter: Payer: Self-pay | Admitting: Family Medicine

## 2020-02-27 ENCOUNTER — Other Ambulatory Visit (INDEPENDENT_AMBULATORY_CARE_PROVIDER_SITE_OTHER): Payer: Self-pay | Admitting: Family Medicine

## 2020-02-27 ENCOUNTER — Other Ambulatory Visit: Payer: Self-pay

## 2020-02-27 ENCOUNTER — Ambulatory Visit (INDEPENDENT_AMBULATORY_CARE_PROVIDER_SITE_OTHER): Payer: BC Managed Care – PPO | Admitting: Medical

## 2020-02-27 ENCOUNTER — Encounter: Payer: Self-pay | Admitting: Family Medicine

## 2020-02-27 ENCOUNTER — Encounter: Payer: Self-pay | Admitting: Medical

## 2020-02-27 VITALS — BP 164/98 | HR 70 | Temp 97.7°F | Ht 63.0 in | Wt 233.8 lb

## 2020-02-27 DIAGNOSIS — Z566 Other physical and mental strain related to work: Secondary | ICD-10-CM | POA: Insufficient documentation

## 2020-02-27 DIAGNOSIS — F419 Anxiety disorder, unspecified: Secondary | ICD-10-CM

## 2020-02-27 DIAGNOSIS — F40298 Other specified phobia: Secondary | ICD-10-CM

## 2020-02-27 DIAGNOSIS — R002 Palpitations: Secondary | ICD-10-CM

## 2020-02-27 HISTORY — DX: Other specified phobia: F40.298

## 2020-02-27 NOTE — Progress Notes (Signed)
Subjective: Chief Complaint  Patient presents with  . Hypertension   Here for concerns of BP.  She has had several issues with palpitations and recent month or 2.  She is also had some chest discomfort.  She has had evaluation in the emergency department, has had evaluation with Dr. Acie Fredrickson cardiology just recently.  She has had in recent weeks echocardiogram, CT chest cardiac score, chest x-ray, labs.  When she saw cardiology, cardiology advise she continue to work on weight loss healthy diet and limiting salt.  She notes that she is checking her blood pressures 3 times per day.  She does feel a lot of anxiety of late.  She was very reluctant to get her Covid vaccine.  When she got the covid vaccine, the staff advised her blood pressure was elevated that day which got her more anxious.  She denies syncope, no chest pain currently, no edema, no major headaches, otherwise in normal state of health.  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 Allergy   . Anemia   . Anxiety   . Asthma    mild intermittent(when allergies are flaring)  . Complication of anesthesia   . Food allergy    Shellfish  . H/O echocardiogram 12/2005   Trace MR, mild TR, nl L ventricular systolic function.  Marland Kitchen Heart murmur    since birth. Never has had any problems  . Hepatic steatosis    per CT  . History of kidney stones   . Kidney stones   . Morbid obesity ([place]) 04/29/2019  . Obesity, unspecified   . PONV (postoperative nausea and vomiting)   . Vitamin D deficiency 01/2010   Family History  Problem Relation Age of Onset  . Hypertension Mother   . Anemia Mother   . Colon polyps Mother   . Anxiety disorder Mother   . Cancer Father        lung  . Kidney Stones Father   . Hypertension Father   . Lung cancer Father 69  . Asthma Brother   . Asthma Daughter   . Allergies Daughter   . Asthma Daughter   . Hypertension Brother   . Breast  cancer Neg Hx   . Colon cancer Neg Hx   . Diabetes Neg Hx   . Heart disease Neg Hx      Objective BP (!) 164/98   Pulse 70   Temp 97.7 F (36.5 C)   Ht 5\' 3"  (1.6 m)   Wt 233 lb 12.8 oz (106.1 kg)   SpO2 98%   BMI 41.42 kg/m   BP Readings from Last 3 Encounters:  02/27/20 (!) 164/98  02/21/20 134/78  02/08/20 122/77   Wt Readings from Last 3 Encounters:  02/27/20 233 lb 12.8 oz (106.1 kg)  02/21/20 231 lb (104.8 kg)  02/08/20 230 lb (104.3 kg)   General appearence: alert, no distress, WD/WN, African American female Neck: supple, no lymphadenopathy, no thyromegaly, no masses, no bruits Heart: RRR, normal S1, S2, no murmurs Lungs: CTA bilaterally, no wheezes, rhonchi, or rales Ext: no edema Pulses: 2+ symmetric, upper and lower extremities, normal cap refill Neuro: Cn2-12 intact, nonfocal exam     Assessment: Encounter Diagnoses  Name Primary?  Marland Kitchen Anxiety Yes  . Palpitations   . Vaccination phobia   . Work stress      Plan: We discussed her concerns.  I  suspect a lot of her issues is related to anxiety and work stress.  We reviewed the recent evaluations that she has had including echocardiogram, cardiac CT, chest x-ray, labs.  Also reviewed her blood pressures from recent visit is going back to December in the chart record and most of them are normal blood pressure readings.  I asked her to quit checking her blood pressure 3 times per day.  I advise she check 1 or 2 times per week and get Vickie, her PCP blood pressure numbers in 2 weeks.  She is still supposed to do event monitor testing with cardiology which is fine.  We discussed ways to deal with anxiety and stress.  I gave her 2 days off to get some rest and think about some of the stuff we talked about today.  Continue regular exercise, limiting salt.  I advised that she not see her getting the second Covid vaccine.  She seemed to have gotten worked up and anxious about getting the first vaccine.  No change  in medications today.  I advised we not prematurely add other blood pressure medications.  She does have propanolol for palpitations.   Alene was seen today for hypertension.  Diagnoses and all orders for this visit:  Anxiety  Palpitations  Vaccination phobia  Work stress   Follow-up with PCP in 2 weeks with BP numbers by my chart.

## 2020-02-28 ENCOUNTER — Encounter: Payer: Self-pay | Admitting: Medical

## 2020-02-28 ENCOUNTER — Ambulatory Visit (HOSPITAL_COMMUNITY): Payer: BC Managed Care – PPO

## 2020-02-28 NOTE — Telephone Encounter (Signed)
error 

## 2020-03-01 ENCOUNTER — Ambulatory Visit (INDEPENDENT_AMBULATORY_CARE_PROVIDER_SITE_OTHER): Payer: BC Managed Care – PPO

## 2020-03-01 DIAGNOSIS — R002 Palpitations: Secondary | ICD-10-CM | POA: Diagnosis not present

## 2020-03-02 ENCOUNTER — Telehealth: Payer: Self-pay | Admitting: Cardiovascular Disease

## 2020-03-02 ENCOUNTER — Ambulatory Visit: Payer: BC Managed Care – PPO | Admitting: Cardiovascular Disease

## 2020-03-02 NOTE — Telephone Encounter (Signed)
Patient wanted to know if Dr. Acie Fredrickson and his staff are able to get readings from her short term monitor. She put it on yesterday and just wanted to make sure it was on correctly and transmitting the necessary information.  The patient also wanted to know if there was any way she could wear it for less than 30 days. If the office calls and she does not answer, please leave a detailed message on her voicemail.

## 2020-03-05 NOTE — Telephone Encounter (Signed)
The only recording posted on the [employer] website was her baseline recording on 03/01/2020.  Reminded patient to press event button if and when she has a symptom.  Informed patient monitor would be billed out the same regardless of wearing the monitor 1 day or 30.  We typically do not cut the monitor short unless the patient had a critical notification identifying a problem or an allergic reaction to the monitor.  Patient stated, she would try to wear the full 30 days.

## 2020-03-07 ENCOUNTER — Encounter (INDEPENDENT_AMBULATORY_CARE_PROVIDER_SITE_OTHER): Payer: Self-pay | Admitting: Family Medicine

## 2020-03-07 ENCOUNTER — Ambulatory Visit (INDEPENDENT_AMBULATORY_CARE_PROVIDER_SITE_OTHER): Payer: BC Managed Care – PPO | Admitting: Family Medicine

## 2020-03-07 ENCOUNTER — Other Ambulatory Visit (INDEPENDENT_AMBULATORY_CARE_PROVIDER_SITE_OTHER): Payer: Self-pay | Admitting: Family Medicine

## 2020-03-07 ENCOUNTER — Other Ambulatory Visit: Payer: Self-pay

## 2020-03-07 VITALS — BP 132/79 | HR 62 | Temp 98.0°F | Ht 63.0 in | Wt 227.0 lb

## 2020-03-07 DIAGNOSIS — F419 Anxiety disorder, unspecified: Secondary | ICD-10-CM

## 2020-03-07 DIAGNOSIS — Z9189 Other specified personal risk factors, not elsewhere classified: Secondary | ICD-10-CM

## 2020-03-07 DIAGNOSIS — Z6841 Body Mass Index (BMI) 40.0 and over, adult: Secondary | ICD-10-CM

## 2020-03-07 MED ORDER — ESCITALOPRAM OXALATE 10 MG PO TABS: 10.0000 mg | ORAL_TABLET | Freq: Every morning | ORAL | 0 refills | Status: DC

## 2020-03-07 MED ORDER — TRAZODONE HCL 50 MG PO TABS: ORAL_TABLET | ORAL | 0 refills | Status: DC

## 2020-03-08 NOTE — Progress Notes (Signed)
Chief Complaint:   OBESITY Vanessa Copeland is here to discuss her progress with her obesity treatment plan along with follow-up of her obesity related diagnoses. Vanessa Copeland is on the Category 3 Plan and states she is following her eating plan approximately 75% of the time. Vanessa Copeland states she is walking and workout classes for 30 minutes 5 times per week.  Today's visit was #: 11 Starting weight: 256 lbs Starting date: 09/06/2019 Today's weight: 227 lbs Today's date: 03/07/2020 Total lbs lost to date: 29 Total lbs lost since last in-office visit: 3  Interim History: Vanessa Copeland continues to do well with weight loss on her Category 3 plan, but she is deviating more from her plan especially at lunch.  Subjective:   1. Anxiety Jarrett was given klonopin by Dr. Juleen China approximately 6 weeks ago, and this was discontinued by myself and Vanessa Copeland was changed to Lexapro.  2. At risk for impaired metabolic function Armita is at increased risk for impaired metabolic function due to decreased protein intake.  Assessment/Plan:   1. Anxiety Behavior modification techniques were discussed today to help Vanessa Copeland deal with her anxiety. Tonjia agreed to discontinue klonopin, and start trazodone 25 mg qhs with no refills; and we will refill Lexapro for 1 month. Vanessa Copeland was encouraged to start therapy. Orders and follow up as documented in patient record.   - escitalopram (LEXAPRO) 10 MG tablet; Take 1 tablet (10 mg total) by mouth every morning.  Dispense: 30 tablet; Refill: 0 - traZODone (DESYREL) 50 MG tablet; Take 1/2 tab daily  Dispense: 15 tablet; Refill: 0  2. At risk for impaired metabolic function Chelisa was given approximately 15 minutes of impaired  metabolic function prevention counseling today. We discussed intensive lifestyle modifications today with an emphasis on specific nutrition and exercise instructions and strategies.   Repetitive spaced learning was employed today to elicit superior memory formation  and behavioral change.  3. Class 3 severe obesity with serious comorbidity and body mass index (BMI) of 40.0 to 44.9 in adult, unspecified obesity type (HCC) Vanessa Copeland is currently in the action stage of change. As such, her goal is to continue with weight loss efforts. She has agreed to the Category 3 Plan with lunch options.   Exercise goals: As is.  Behavioral modification strategies: meal planning and cooking strategies.  Vanessa Copeland has agreed to follow-up with our clinic in 2 to 3 weeks with Dr. Juleen China. She was informed of the importance of frequent follow-up visits to maximize her success with intensive lifestyle modifications for her multiple health conditions.   Objective:   Blood pressure 132/79, pulse 62, temperature 98 F (36.7 C), temperature source Oral, height 5\' 3"  (1.6 m), weight 227 lb (103 kg), SpO2 99 %. Body mass index is 40.21 kg/m.  General: Cooperative, alert, well developed, in no acute distress. HEENT: Conjunctivae and lids unremarkable. Cardiovascular: Regular rhythm.  Lungs: Normal work of breathing. Neurologic: No focal deficits.   Lab Results  Component Value Date   CREATININE 0.71 01/18/2020   BUN 16 01/18/2020   NA 138 01/18/2020   K 3.5 01/18/2020   CL 103 01/18/2020   CO2 25 01/18/2020   Lab Results  Component Value Date   ALT 19 12/27/2019   AST 19 12/27/2019   ALKPHOS 78 12/27/2019   BILITOT 0.6 12/27/2019   Lab Results  Component Value Date   HGBA1C 5.1 01/02/2020   HGBA1C 5.6 09/06/2019   HGBA1C 5.7 (H) 05/04/2019   Lab Results  Component Value Date  INSULIN 14.6 09/06/2019   Lab Results  Component Value Date   TSH 2.000 12/27/2019   Lab Results  Component Value Date   CHOL 190 01/02/2020   HDL 116 01/02/2020   LDLCALC 62 01/02/2020   TRIG 63 01/02/2020   Lab Results  Component Value Date   WBC 7.0 01/18/2020   HGB 11.7 (L) 01/18/2020   HCT 36.3 01/18/2020   MCV 89.9 01/18/2020   PLT 319 01/18/2020   Lab Results   Component Value Date   IRON 57 12/27/2019   TIBC 321 12/27/2019   FERRITIN 51 12/27/2019   Attestation Statements:   Reviewed by clinician on day of visit: allergies, medications, problem list, medical history, surgical history, family history, social history, and previous encounter notes.   I, Trixie Dredge, am acting as transcriptionist for Dennard Nip, MD.  I have reviewed the above documentation for accuracy and completeness, and I agree with the above. -  Dennard Nip, MD

## 2020-03-10 ENCOUNTER — Ambulatory Visit: Payer: BC Managed Care – PPO | Attending: Internal Medicine

## 2020-03-10 DIAGNOSIS — Z23 Encounter for immunization: Secondary | ICD-10-CM

## 2020-03-10 NOTE — Progress Notes (Signed)
   Covid-19 Vaccination Clinic  Name:  Vanessa Copeland    MRN: [id number] DOB: [birth year]  03/10/2020  [name] was observed post Covid-19 immunization for 15 minutes without incident. She was provided with Vaccine Information Sheet and instruction to access the V-Safe system.   [name] was instructed to call 911 with any severe reactions post vaccine: Marland Kitchen Difficulty breathing  . Swelling of face and throat  . A fast heartbeat  . A bad rash all over body  . Dizziness and weakness   Immunizations Administered    Name Date Dose VIS Date Route   Pfizer COVID-19 Vaccine 03/10/2020  8:16 AM 0.3 mL 12/02/2019 Intramuscular   Manufacturer: [place]   Lot: [id number]   [place]: [id number]

## 2020-03-16 ENCOUNTER — Other Ambulatory Visit: Payer: Self-pay

## 2020-03-16 ENCOUNTER — Encounter: Payer: Self-pay | Admitting: Family Medicine

## 2020-03-16 ENCOUNTER — Ambulatory Visit: Payer: BC Managed Care – PPO | Admitting: Family Medicine

## 2020-03-16 VITALS — BP 162/84 | HR 66 | Temp 96.9°F | Wt 225.0 lb

## 2020-03-16 DIAGNOSIS — J302 Other seasonal allergic rhinitis: Secondary | ICD-10-CM | POA: Diagnosis not present

## 2020-03-16 DIAGNOSIS — J392 Other diseases of pharynx: Secondary | ICD-10-CM

## 2020-03-16 DIAGNOSIS — H9202 Otalgia, left ear: Secondary | ICD-10-CM | POA: Diagnosis not present

## 2020-03-16 NOTE — Progress Notes (Signed)
   Subjective:  Documentation for virtual audio and video telecommunications through [place] encounter:  The patient was located at work. 2 patient identifiers used.  The provider was located in the office. The patient did consent to this visit and is aware of possible charges through their insurance for this visit.  The other persons participating in this telemedicine service were none.    Patient ID: Vanessa Copeland, female    DOB: [birth year], 54 y.o.   MRN: [id number]  HPI Chief Complaint  Patient presents with  . sore throat    sore throat and ear pain- started monday, getting worse at night.    Complains of a 4-5 day history of sore bilateral cervical lymph nodes, scratchy throat, mild left ear pain and hoarse voice in the mornings. Has a history of allergies and has not been taking her allergy med.  She has a dull frontal headache as well.  Denies fever, chills, sinus pain, nasal congestion, difficulty swallowing, cough, shortness of breath, abdominal pain, N/V/D.   Received her 2nd Covid vaccine 2 days prior to onset of symptoms.    Reviewed allergies, medications, past medical, surgical, family, and social history.   Review of Systems Pertinent positives and negatives in the history of present illness.     Objective:   Physical Exam BP (!) 162/84   Pulse 66   Temp (!) 96.9 F (36.1 C)   Wt 225 lb (102.1 kg)   BMI 39.86 kg/m   Alert and oriented and in no acute distress.  Respirations unlabored.  Normal speech, mood and thought process      Assessment & Plan:  Seasonal allergies  Otalgia, left  Throat irritation  Discussed that her symptoms may be related to allergies.  She will start back on Xyzal.  Encouraged increased hydration and she may take Tylenol or ibuprofen.  No obvious in infectious process but she will follow-up with me next week if she is not improving or if she has any new or worsening symptoms.  She did receive her Covid vaccine 2 days prior  to onset of symptoms which may be weakening her immune system slightly as well.  Time spent on call was 15 minutes and in review of previous records 20 minutes total.  This virtual service is not related to other E/M service within previous 7 days.

## 2020-03-28 ENCOUNTER — Other Ambulatory Visit: Payer: Self-pay

## 2020-03-28 ENCOUNTER — Ambulatory Visit (INDEPENDENT_AMBULATORY_CARE_PROVIDER_SITE_OTHER): Payer: BC Managed Care – PPO | Admitting: Family Medicine

## 2020-03-28 ENCOUNTER — Encounter (INDEPENDENT_AMBULATORY_CARE_PROVIDER_SITE_OTHER): Payer: Self-pay | Admitting: Family Medicine

## 2020-03-28 VITALS — BP 126/78 | HR 66 | Temp 97.8°F | Ht 63.0 in | Wt 229.0 lb

## 2020-03-28 DIAGNOSIS — F419 Anxiety disorder, unspecified: Secondary | ICD-10-CM | POA: Diagnosis not present

## 2020-03-28 DIAGNOSIS — Z6841 Body Mass Index (BMI) 40.0 and over, adult: Secondary | ICD-10-CM | POA: Diagnosis not present

## 2020-03-28 DIAGNOSIS — E559 Vitamin D deficiency, unspecified: Secondary | ICD-10-CM | POA: Diagnosis not present

## 2020-03-28 MED ORDER — VITAMIN D (ERGOCALCIFEROL) 1.25 MG (50000 UNIT) PO CAPS: 50000.0000 [IU] | ORAL_CAPSULE | ORAL | 0 refills | Status: DC

## 2020-03-28 MED ORDER — ESCITALOPRAM OXALATE 20 MG PO TABS: 20.0000 mg | ORAL_TABLET | Freq: Every morning | ORAL | 0 refills | Status: DC

## 2020-03-28 NOTE — Progress Notes (Signed)
Chief Complaint:   OBESITY Vanessa Copeland is here to discuss her progress with her obesity treatment plan along with follow-up of her obesity related diagnoses. Vanessa Copeland is on the Category 3 Plan with lunch options and states she is following her eating plan approximately 75% of the time. Vanessa Copeland states she is doing workout class or walking for 30 minutes 5 times per week.  Today's visit was #: 12 Starting weight: 256 lbs Starting date: 09/06/2019 Today's weight: 229 lbs Today's date: 03/28/2020 Total lbs lost to date: 27 Total lbs lost since last in-office visit: 0  Interim History: Vanessa Copeland feels burnt out with the Category 3 meal plan, and she would like to change to the journaling meal plan. She feels that exercises helps with mood stabilization.  Subjective:   1. Vitamin D deficiency Oneal's Vit D level on 01/02/2020 was 62.5. She is on prescription strength Vit D supplementation.  2. Anxiety Vanessa Copeland was previously on klonopin and changed to trazodone 50 mg 1/2 tablet qhs to help reduce anxiety and racing thoughts at night. She feels that escitalopram 10 mg is wearing off. She denies suicidal ideas or homicidal ideas. She is interested in establishing a therapist.  Assessment/Plan:   1. Vitamin D deficiency Low Vitamin D level contributes to fatigue and are associated with obesity, breast, and colon cancer. We will refill prescription Vitamin D for 1 month. Vanessa Copeland will follow-up for routine testing of Vitamin D, at least 2-3 times per year to avoid over-replacement. We will check labs today.  - CBC with Differential/Platelet - Comprehensive metabolic panel - Hemoglobin A1c - Insulin, random - Lipid Panel With LDL/HDL Ratio - VITAMIN D 25 Hydroxy (Vit-D Deficiency, Fractures) - T3 - T4, free - TSH - Vitamin D, Ergocalciferol, (DRISDOL) 1.25 MG (50000 UNIT) CAPS capsule; Take 1 capsule (50,000 Units total) by mouth every 7 (seven) days.  Dispense: 4 capsule; Refill: 0  2.  Anxiety Behavior modification techniques were discussed today to help Vanessa Copeland deal with her anxiety. Vanessa Copeland agreed to increase escitalopram to 20 mg q daily with no refills. We will send a referral to [facility] Psychology for generalized anxiety disorder. Orders and follow up as documented in patient record.   - escitalopram (LEXAPRO) 20 MG tablet; Take 1 tablet (20 mg total) by mouth every morning.  Dispense: 30 tablet; Refill: 0  3. Class 3 severe obesity with serious comorbidity and body mass index (BMI) of 40.0 to 44.9 in adult, unspecified obesity type (HCC) Vanessa Copeland is currently in the action stage of change. As such, her goal is to continue with weight loss efforts. She has agreed to change to keeping a food journal and adhering to recommended goals of 1300-1600 calories and 90+ grams of protein daily.   Exercise goals: As is.  Behavioral modification strategies: increasing lean protein intake, decreasing simple carbohydrates, no skipping meals and keeping a strict food journal.  Vanessa Copeland has agreed to follow-up with our clinic in 3 weeks. She was informed of the importance of frequent follow-up visits to maximize her success with intensive lifestyle modifications for her multiple health conditions.   Vanessa Copeland was informed we would discuss her lab results at her next visit unless there is a critical issue that needs to be addressed sooner. Vanessa Copeland agreed to keep her next visit at the agreed upon time to discuss these results.  Objective:   Blood pressure 126/78, pulse 66, temperature 97.8 F (36.6 C), temperature source Oral, height 5\' 3"  (1.6 m), weight 229 lb (  103.9 kg), SpO2 100 %. Body mass index is 40.57 kg/m.  General: Cooperative, alert, well developed, in no acute distress. HEENT: Conjunctivae and lids unremarkable. Cardiovascular: Regular rhythm.  Lungs: Normal work of breathing. Neurologic: No focal deficits.   Lab Results  Component Value Date   CREATININE 0.71  01/18/2020   BUN 16 01/18/2020   NA 138 01/18/2020   K 3.5 01/18/2020   CL 103 01/18/2020   CO2 25 01/18/2020   Lab Results  Component Value Date   ALT 19 12/27/2019   AST 19 12/27/2019   ALKPHOS 78 12/27/2019   BILITOT 0.6 12/27/2019   Lab Results  Component Value Date   HGBA1C 5.1 01/02/2020   HGBA1C 5.6 09/06/2019   HGBA1C 5.7 (H) 05/04/2019   Lab Results  Component Value Date   INSULIN 14.6 09/06/2019   Lab Results  Component Value Date   TSH 2.000 12/27/2019   Lab Results  Component Value Date   CHOL 190 01/02/2020   HDL 116 01/02/2020   LDLCALC 62 01/02/2020   TRIG 63 01/02/2020   Lab Results  Component Value Date   WBC 7.0 01/18/2020   HGB 11.7 (L) 01/18/2020   HCT 36.3 01/18/2020   MCV 89.9 01/18/2020   PLT 319 01/18/2020   Lab Results  Component Value Date   IRON 57 12/27/2019   TIBC 321 12/27/2019   FERRITIN 51 12/27/2019   Attestation Statements:   Reviewed by clinician on day of visit: allergies, medications, problem list, medical history, surgical history, family history, social history, and previous encounter notes.   I, Trixie Dredge, am acting as transcriptionist for Dennard Nip, MD.  I have reviewed the above documentation for accuracy and completeness, and I agree with the above. -  Dennard Nip, MD

## 2020-03-29 ENCOUNTER — Other Ambulatory Visit (INDEPENDENT_AMBULATORY_CARE_PROVIDER_SITE_OTHER): Payer: Self-pay

## 2020-03-29 DIAGNOSIS — D696 Thrombocytopenia, unspecified: Secondary | ICD-10-CM

## 2020-03-29 LAB — COMPREHENSIVE METABOLIC PANEL
ALT: 17 IU/L (ref 0–32)
AST: 19 IU/L (ref 0–40)
Albumin/Globulin Ratio: 1.4 (ref 1.2–2.2)
Albumin: 4.1 g/dL (ref 3.8–4.9)
Alkaline Phosphatase: 94 IU/L (ref 39–117)
BUN/Creatinine Ratio: 15 (ref 9–23)
BUN: 11 mg/dL (ref 6–24)
Bilirubin Total: 0.8 mg/dL (ref 0.0–1.2)
CO2: 27 mmol/L (ref 20–29)
Calcium: 9.5 mg/dL (ref 8.7–10.2)
Chloride: 104 mmol/L (ref 96–106)
Creatinine, Ser: 0.74 mg/dL (ref 0.57–1.00)
GFR calc Af Amer: 108 mL/min/{1.73_m2} (ref >59)
GFR calc non Af Amer: 93 mL/min/{1.73_m2} (ref >59)
Globulin, Total: 2.9 g/dL (ref 1.5–4.5)
Glucose: 83 mg/dL (ref 65–99)
Potassium: 4.3 mmol/L (ref 3.5–5.2)
Sodium: 143 mmol/L (ref 134–144)
Total Protein: 7 g/dL (ref 6.0–8.5)

## 2020-03-29 LAB — TSH: TSH: 2 u[IU]/mL (ref 0.450–4.500)

## 2020-03-29 LAB — CBC WITH DIFFERENTIAL/PLATELET
Basophils Absolute: 0 10*3/uL (ref 0.0–0.2)
Basophils Absolute: 0 10*3/uL (ref 0.0–0.2)
Basos: 1 %
Basos: 0 %
EOS (ABSOLUTE): 0.1 10*3/uL (ref 0.0–0.4)
EOS (ABSOLUTE): 0.1 10*3/uL (ref 0.0–0.4)
Eos: 2 %
Eos: 2 %
Hematocrit: 36.5 % (ref 34.0–46.6)
Hematocrit: 34.6 % (ref 34.0–46.6)
Hemoglobin: 11.6 g/dL (ref 11.1–15.9)
Hemoglobin: 11.4 g/dL (ref 11.1–15.9)
Immature Grans (Abs): 0 10*3/uL (ref 0.0–0.1)
Immature Granulocytes: 0 %
Lymphocytes Absolute: 2.6 10*3/uL (ref 0.7–3.1)
Lymphocytes Absolute: 3.4 10*3/uL — ABNORMAL HIGH (ref 0.7–3.1)
Lymphs: 42 %
Lymphs: 43 %
MCH: 29.7 pg (ref 26.6–33.0)
MCH: 29.1 pg (ref 26.6–33.0)
MCHC: 31.8 g/dL (ref 31.5–35.7)
MCHC: 32.9 g/dL (ref 31.5–35.7)
MCV: 93 fL (ref 79–97)
MCV: 88 fL (ref 79–97)
Monocytes Absolute: 0.4 10*3/uL (ref 0.1–0.9)
Monocytes Absolute: 0.7 10*3/uL (ref 0.1–0.9)
Monocytes: 7 %
Monocytes: 8 %
Neutrophils Absolute: 3 10*3/uL (ref 1.4–7.0)
Neutrophils Absolute: 3.7 10*3/uL (ref 1.4–7.0)
Neutrophils: 48 %
Neutrophils: 47 %
Platelets: 82 10*3/uL — CL (ref 150–450)
Platelets: 169 10*3/uL (ref 150–450)
RBC: 3.91 x10E6/uL (ref 3.77–5.28)
RBC: 3.92 x10E6/uL (ref 3.77–5.28)
RDW: 14.1 % (ref 11.7–15.4)
RDW: 14.9 % (ref 11.7–15.4)
WBC: 6.1 10*3/uL (ref 3.4–10.8)
WBC: 7.9 10*3/uL (ref 3.4–10.8)

## 2020-03-29 LAB — T4, FREE: Free T4: 1.16 ng/dL (ref 0.82–1.77)

## 2020-03-29 LAB — VITAMIN D 25 HYDROXY (VIT D DEFICIENCY, FRACTURES): Vit D, 25-Hydroxy: 39.8 ng/mL (ref 30.0–100.0)

## 2020-03-29 LAB — LIPID PANEL WITH LDL/HDL RATIO
Cholesterol, Total: 180 mg/dL (ref 100–199)
HDL: 47 mg/dL (ref >39)
LDL Chol Calc (NIH): 121 mg/dL — ABNORMAL HIGH (ref 0–99)
LDL/HDL Ratio: 2.6 ratio (ref 0.0–3.2)
Triglycerides: 64 mg/dL (ref 0–149)
VLDL Cholesterol Cal: 12 mg/dL (ref 5–40)

## 2020-03-29 LAB — T3: T3, Total: 94 ng/dL (ref 71–180)

## 2020-03-29 LAB — INSULIN, RANDOM: INSULIN: 6.6 u[IU]/mL (ref 2.6–24.9)

## 2020-03-29 LAB — HEMOGLOBIN A1C
Est. average glucose Bld gHb Est-mCnc: 108 mg/dL
Hgb A1c MFr Bld: 5.4 % (ref 4.8–5.6)

## 2020-03-29 NOTE — Progress Notes (Signed)
Pt notified and agreeable with coming in for a recheck.

## 2020-03-29 NOTE — Progress Notes (Signed)
Patient notified of normal results and gave verbal understanding.

## 2020-03-29 NOTE — Progress Notes (Signed)
Please tell her the platelets are normal.

## 2020-03-29 NOTE — Progress Notes (Signed)
Please have her come back in for a recheck, may be lab error.

## 2020-04-03 ENCOUNTER — Other Ambulatory Visit (INDEPENDENT_AMBULATORY_CARE_PROVIDER_SITE_OTHER): Payer: Self-pay | Admitting: Family Medicine

## 2020-04-03 DIAGNOSIS — F419 Anxiety disorder, unspecified: Secondary | ICD-10-CM

## 2020-04-05 ENCOUNTER — Other Ambulatory Visit (INDEPENDENT_AMBULATORY_CARE_PROVIDER_SITE_OTHER): Payer: Self-pay | Admitting: Family Medicine

## 2020-04-05 DIAGNOSIS — F419 Anxiety disorder, unspecified: Secondary | ICD-10-CM

## 2020-04-06 ENCOUNTER — Ambulatory Visit (AMBULATORY_SURGERY_CENTER): Payer: Self-pay | Admitting: *Deleted

## 2020-04-06 ENCOUNTER — Other Ambulatory Visit: Payer: Self-pay

## 2020-04-06 VITALS — Temp 97.1°F | Ht 63.0 in | Wt 232.0 lb

## 2020-04-06 DIAGNOSIS — Z86010 Personal history of colonic polyps: Secondary | ICD-10-CM

## 2020-04-06 MED ORDER — SUTAB 1479-225-188 MG PO TABS: 24.0000 | ORAL_TABLET | ORAL | 0 refills | Status: DC

## 2020-04-06 NOTE — Progress Notes (Signed)
No egg or soy allergy known to patient   issues with past sedation with any surgeries  or procedures of PONV , no intubation problems  No diet pills per patient No home 02 use per patient  No blood thinners per patient  Pt denies issues with constipation  No A fib or A flutter  EMMI video sent to pt's e mail   03-10-2020 completed vaccine series   Due to the COVID-19 pandemic we are asking patients to follow these guidelines. Please only bring one care partner. Please be aware that your care partner may wait in the car in the parking lot or if they feel like they will be too hot to wait in the car, they may wait in the lobby on the 4th floor. All care partners are required to wear a mask the entire time (we do not have any that we can provide them), they need to practice social distancing, and we will do a Covid check for all patient's and care partners when you arrive. Also we will check their temperature and your temperature. If the care partner waits in their car they need to stay in the parking lot the entire time and we will call them on their cell phone when the patient is ready for discharge so they can bring the car to the front of the building. Also all patient's will need to wear a mask into building.

## 2020-04-16 ENCOUNTER — Telehealth: Payer: Self-pay | Admitting: Gastroenterology

## 2020-04-16 MED ORDER — SUTAB 1479-225-188 MG PO TABS: 24.0000 | ORAL_TABLET | ORAL | 0 refills | Status: DC

## 2020-04-16 NOTE — Telephone Encounter (Signed)
Resent Sutab - pt called and notified -Vanessa Copeland PV

## 2020-04-18 ENCOUNTER — Ambulatory Visit (INDEPENDENT_AMBULATORY_CARE_PROVIDER_SITE_OTHER): Payer: BC Managed Care – PPO | Admitting: Family Medicine

## 2020-04-18 ENCOUNTER — Other Ambulatory Visit: Payer: Self-pay

## 2020-04-18 ENCOUNTER — Encounter: Payer: Self-pay | Admitting: Gastroenterology

## 2020-04-18 ENCOUNTER — Encounter (INDEPENDENT_AMBULATORY_CARE_PROVIDER_SITE_OTHER): Payer: Self-pay | Admitting: Family Medicine

## 2020-04-18 VITALS — BP 145/76 | HR 62 | Temp 97.8°F | Ht 63.0 in | Wt 229.0 lb

## 2020-04-18 DIAGNOSIS — E559 Vitamin D deficiency, unspecified: Secondary | ICD-10-CM | POA: Diagnosis not present

## 2020-04-18 DIAGNOSIS — Z9189 Other specified personal risk factors, not elsewhere classified: Secondary | ICD-10-CM | POA: Diagnosis not present

## 2020-04-18 DIAGNOSIS — E7849 Other hyperlipidemia: Secondary | ICD-10-CM | POA: Diagnosis not present

## 2020-04-18 DIAGNOSIS — F3289 Other specified depressive episodes: Secondary | ICD-10-CM | POA: Diagnosis not present

## 2020-04-18 DIAGNOSIS — Z6841 Body Mass Index (BMI) 40.0 and over, adult: Secondary | ICD-10-CM

## 2020-04-18 MED ORDER — ESCITALOPRAM OXALATE 20 MG PO TABS: 20.0000 mg | ORAL_TABLET | Freq: Every morning | ORAL | 0 refills | Status: DC

## 2020-04-18 MED ORDER — VITAMIN D (ERGOCALCIFEROL) 1.25 MG (50000 UNIT) PO CAPS: 50000.0000 [IU] | ORAL_CAPSULE | ORAL | 0 refills | Status: DC

## 2020-04-19 NOTE — Progress Notes (Signed)
Chief Complaint:   OBESITY Vanessa Copeland is here to discuss her progress with her obesity treatment plan along with follow-up of her obesity related diagnoses. Vanessa Copeland is on keeping a food journal and adhering to recommended goals of 1300-1600 calories and 90+ grams of protein daily and states she is following her eating plan approximately 50% of the time. Vanessa Copeland states she is walking and exercise class for 30 minutes 5 times per week.  Today's visit was #: 13 Starting weight: 256 lbs Starting date: 09/06/2019 Today's weight: 229 lbs Today's date: 04/18/2020 Total lbs lost to date: 27 Total lbs lost since last in-office visit: 0  Interim History: Vanessa Copeland has done well maintaining her weight, but she is struggling to journal all of the time. She is doing well being mindful and thinks her protein may not be adequate.   Subjective:   1. Vitamin D deficiency Vanessa Copeland's Vit D level is worsening, her level has decreased and is no longer at goal. She is on Vit D and denies nausea, vomiting, or muscle weakness. I discussed labs with the patient today.  2. Other hyperlipidemia Vanessa Copeland's last LDL is worsening, and has increased. She denies eating fatty meats or other high saturates fat foods. She denies chest pain and she is not on statin. I discussed labs with the patient today.  3. Other depression, with emotional eating Vanessa Copeland is stable on her medications. She is doing better with decreasing emotional eating.  4. At risk for heart disease Vanessa Copeland is at a higher than average risk for cardiovascular disease due to obesity.   Assessment/Plan:   1. Vitamin D deficiency Low Vitamin D level contributes to fatigue and are associated with obesity, breast, and colon cancer. We will refill prescription Vitamin D for 1 month. Naz will follow-up for routine testing of Vitamin D, at least 2-3 times per year to avoid over-replacement. We will recheck labs in 3 months.  - Vitamin D, Ergocalciferol, (DRISDOL)  1.25 MG (50000 UNIT) CAPS capsule; Take 1 capsule (50,000 Units total) by mouth every 7 (seven) days.  Dispense: 4 capsule; Refill: 0  2. Other hyperlipidemia Cardiovascular risk and specific lipid/LDL goals reviewed. We discussed several lifestyle modifications today and Vanessa Copeland will continue to work on diet, exercise and weight loss efforts. We will recheck labs in 3 months. Orders and follow up as documented in patient record.   3. Other depression, with emotional eating Behavior modification techniques were discussed today to help Vanessa Copeland deal with her emotional/non-hunger eating behaviors. We will refill Lexapro for 1 month. Orders and follow up as documented in patient record.   - escitalopram (LEXAPRO) 20 MG tablet; Take 1 tablet (20 mg total) by mouth every morning.  Dispense: 30 tablet; Refill: 0  4. At risk for heart disease Vanessa Copeland was given approximately 15 minutes of coronary artery disease prevention counseling today. She is 54 y.o. female and has risk factors for heart disease including obesity. We discussed intensive lifestyle modifications today with an emphasis on specific weight loss instructions and strategies.   Repetitive spaced learning was employed today to elicit superior memory formation and behavioral change.  5. Class 3 severe obesity with serious comorbidity and body mass index (BMI) of 40.0 to 44.9 in adult, unspecified obesity type (HCC) Vanessa Copeland is currently in the action stage of change. As such, her goal is to continue with weight loss efforts. She has agreed to the Category 3 Plan or keeping a food journal and adhering to recommended goals of 1300-1600  calories and 90+ grams of protein daily.   Exercise goals:  As is.  Behavioral modification strategies: increasing lean protein intake and meal planning and cooking strategies.  Vanessa Copeland has agreed to follow-up with our clinic in 2 to 3 weeks. She was informed of the importance of frequent follow-up visits to maximize  her success with intensive lifestyle modifications for her multiple health conditions.   Objective:   Blood pressure (!) 145/76, pulse 62, temperature 97.8 F (36.6 C), temperature source Oral, height 5\' 3"  (1.6 m), weight 229 lb (103.9 kg), SpO2 100 %. Body mass index is 40.57 kg/m.  General: Cooperative, alert, well developed, in no acute distress. HEENT: Conjunctivae and lids unremarkable. Cardiovascular: Regular rhythm.  Lungs: Normal work of breathing. Neurologic: No focal deficits.   Lab Results  Component Value Date   CREATININE 0.74 03/28/2020   BUN 11 03/28/2020   NA 143 03/28/2020   K 4.3 03/28/2020   CL 104 03/28/2020   CO2 27 03/28/2020   Lab Results  Component Value Date   ALT 17 03/28/2020   AST 19 03/28/2020   ALKPHOS 94 03/28/2020   BILITOT 0.8 03/28/2020   Lab Results  Component Value Date   HGBA1C 5.4 03/28/2020   HGBA1C 5.1 01/02/2020   HGBA1C 5.6 09/06/2019   HGBA1C 5.7 (H) 05/04/2019   Lab Results  Component Value Date   INSULIN 6.6 03/28/2020   INSULIN 14.6 09/06/2019   Lab Results  Component Value Date   TSH 2.000 03/28/2020   Lab Results  Component Value Date   CHOL 180 03/28/2020   HDL 47 03/28/2020   LDLCALC 121 (H) 03/28/2020   TRIG 64 03/28/2020   Lab Results  Component Value Date   WBC 7.9 03/29/2020   HGB 11.4 03/29/2020   HCT 34.6 03/29/2020   MCV 88 03/29/2020   PLT 169 03/29/2020   Lab Results  Component Value Date   IRON 57 12/27/2019   TIBC 321 12/27/2019   FERRITIN 51 12/27/2019   Attestation Statements:   Reviewed by clinician on day of visit: allergies, medications, problem list, medical history, surgical history, family history, social history, and previous encounter notes.   I, Trixie Dredge, am acting as transcriptionist for Dennard Nip, MD.  I have reviewed the above documentation for accuracy and completeness, and I agree with the above. -  Dennard Nip, MD

## 2020-04-20 ENCOUNTER — Ambulatory Visit (AMBULATORY_SURGERY_CENTER): Payer: BC Managed Care – PPO | Admitting: Gastroenterology

## 2020-04-20 ENCOUNTER — Other Ambulatory Visit: Payer: Self-pay

## 2020-04-20 ENCOUNTER — Encounter: Payer: Self-pay | Admitting: Gastroenterology

## 2020-04-20 VITALS — BP 134/82 | HR 59 | Temp 96.6°F | Resp 17 | Ht 63.0 in | Wt 232.0 lb

## 2020-04-20 DIAGNOSIS — Z86010 Personal history of colonic polyps: Secondary | ICD-10-CM

## 2020-04-20 DIAGNOSIS — D122 Benign neoplasm of ascending colon: Secondary | ICD-10-CM | POA: Diagnosis not present

## 2020-04-20 MED ORDER — SODIUM CHLORIDE 0.9 % IV SOLN: 500.0000 mL | Freq: Once | INTRAVENOUS | Status: DC

## 2020-04-20 NOTE — Patient Instructions (Signed)
Handouts provided on polyps, diverticulosis and hemorrhoids.   YOU HAD AN ENDOSCOPIC PROCEDURE TODAY AT THE [facility]:   Refer to the procedure report that was given to you for any specific questions about what was found during the examination.  If the procedure report does not answer your questions, please call your gastroenterologist to clarify.  If you requested that your care partner not be given the details of your procedure findings, then the procedure report has been included in a sealed envelope for you to review at your convenience later.  YOU SHOULD EXPECT: Some feelings of bloating in the abdomen. Passage of more gas than usual.  Walking can help get rid of the air that was put into your GI tract during the procedure and reduce the bloating. If you had a lower endoscopy (such as a colonoscopy or flexible sigmoidoscopy) you may notice spotting of blood in your stool or on the toilet paper. If you underwent a bowel prep for your procedure, you may not have a normal bowel movement for a few days.  Please Note:  You might notice some irritation and congestion in your nose or some drainage.  This is from the oxygen used during your procedure.  There is no need for concern and it should clear up in a day or so.  SYMPTOMS TO REPORT IMMEDIATELY:   Following lower endoscopy (colonoscopy or flexible sigmoidoscopy):  Excessive amounts of blood in the stool  Significant tenderness or worsening of abdominal pains  Swelling of the abdomen that is new, acute  Fever of 100F or higher   For urgent or emergent issues, a gastroenterologist can be reached at any hour by calling [phone]. Do not use MyChart messaging for urgent concerns.    DIET:  We do recommend a small meal at first, but then you may proceed to your regular diet.  Drink plenty of fluids but you should avoid alcoholic beverages for 24 hours.  ACTIVITY:  You should plan to take it easy for the rest of today and  you should NOT DRIVE or use heavy machinery until tomorrow (because of the sedation medicines used during the test).    FOLLOW UP: Our staff will call the number listed on your records 48-72 hours following your procedure to check on you and address any questions or concerns that you may have regarding the information given to you following your procedure. If we do not reach you, we will leave a message.  We will attempt to reach you two times.  During this call, we will ask if you have developed any symptoms of COVID 19. If you develop any symptoms (ie: fever, flu-like symptoms, shortness of breath, cough etc.) before then, please call [phone].  If you test positive for Covid 19 in the 2 weeks post procedure, please call and report this information to us.    If any biopsies were taken you will be contacted by phone or by letter within the next 1-3 weeks.  Please call us at [phone] if you have not heard about the biopsies in 3 weeks.    SIGNATURES/CONFIDENTIALITY: You and/or your care partner have signed paperwork which will be entered into your electronic medical record.  These signatures attest to the fact that that the information above on your After Visit Summary has been reviewed and is understood.  Full responsibility of the confidentiality of this discharge information lies with you and/or your care-partner.  

## 2020-04-20 NOTE — Progress Notes (Signed)
Pt's states no medical or surgical changes since previsit or office visit.  Temp- June Vitals- Courtney 

## 2020-04-20 NOTE — Op Note (Signed)
[facility] Patient Name: Vanessa Copeland Procedure Date: 04/20/2020 8:50 AM MRN: [id number] Endoscopist: Remo Lipps P. Havery Moros , MD Age: 54 Referring MD:  Date of Birth: 01/07/1966 Gender: Female Account #: [id number] Procedure:                Colonoscopy Indications:              Surveillance: Personal history of adenomatous                            polyps on last colonoscopy 3 years ago Medicines:                Monitored Anesthesia Care Procedure:                Pre-Anesthesia Assessment:                           - Prior to the procedure, a History and Physical                            was performed, and patient medications and                            allergies were reviewed. The patient's tolerance of                            previous anesthesia was also reviewed. The risks                            and benefits of the procedure and the sedation                            options and risks were discussed with the patient.                            All questions were answered, and informed consent                            was obtained. Prior Anticoagulants: The patient has                            taken no previous anticoagulant or antiplatelet                            agents. ASA Grade Assessment: II - A patient with                            mild systemic disease. After reviewing the risks                            and benefits, the patient was deemed in                            satisfactory condition to undergo the procedure.  After obtaining informed consent, the colonoscope                            was passed under direct vision. Throughout the                            procedure, the patient's blood pressure, pulse, and                            oxygen saturations were monitored continuously. The                            Colonoscope was introduced through the anus and                            advanced to the the  cecum, identified by                            appendiceal orifice and ileocecal valve. The                            colonoscopy was performed without difficulty. The                            patient tolerated the procedure well. The quality                            of the bowel preparation was good. The ileocecal                            valve, appendiceal orifice, and rectum were                            photographed. Scope In: 8:59:58 AM Scope Out: 9:20:53 AM Scope Withdrawal Time: 0 hours 17 minutes 52 seconds  Total Procedure Duration: 0 hours 20 minutes 55 seconds  Findings:                 The perianal and digital rectal examinations were                            normal.                           Two flat polyps were found in the ascending colon.                            The polyps were diminutive in size. These polyps                            were removed with a cold snare. Resection and                            retrieval were complete.  A few small-mouthed diverticula were found in the                            descending colon.                           Internal hemorrhoids were found during retroflexion.                           The exam was otherwise without abnormality. Complications:            No immediate complications. Estimated blood loss:                            Minimal. Estimated Blood Loss:     Estimated blood loss was minimal. Impression:               - Two diminutive polyps in the ascending colon,                            removed with a cold snare. Resected and retrieved.                           - Diverticulosis in the descending colon.                           - Internal hemorrhoids.                           - The examination was otherwise normal. Recommendation:           - Patient has a contact number available for                            emergencies. The signs and symptoms of potential                             delayed complications were discussed with the                            patient. Return to normal activities tomorrow.                            Written discharge instructions were provided to the                            patient.                           - Resume previous diet.                           - Continue present medications.                           - Await pathology results. Remo Lipps P. Han Vejar, MD 04/20/2020 9:24:50 AM This report has been signed electronically.

## 2020-04-20 NOTE — Progress Notes (Signed)
Called to room to assist during endoscopic procedure.  Patient ID and intended procedure confirmed with present staff. Received instructions for my participation in the procedure from the performing physician.  

## 2020-04-20 NOTE — Progress Notes (Signed)
To PACU, VSS. Report to [email or web address] 

## 2020-04-24 ENCOUNTER — Telehealth: Payer: Self-pay

## 2020-04-24 NOTE — Telephone Encounter (Signed)
  Follow up Call-  Call back number 04/20/2020  Post procedure Call Back phone  # [phone]  Permission to leave phone message Yes  Some recent data might be hidden     Patient questions:  Do you have a fever, pain , or abdominal swelling? No. Pain Score  0 *  Have you tolerated food without any problems? Yes.    Have you been able to return to your normal activities? Yes.    Do you have any questions about your discharge instructions: Diet   No. Medications  No. Follow up visit  No.  Do you have questions or concerns about your Care? No.  Actions: * If pain score is 4 or above: No action needed, pain <4.  1. Have you developed a fever since your procedure? no  2.   Have you had an respiratory symptoms (SOB or cough) since your procedure? no  3.   Have you tested positive for COVID 19 since your procedure no  4.   Have you had any family members/close contacts diagnosed with the COVID 19 since your procedure?  no   If yes to any of these questions please route to Joylene John, RN and Erenest Rasher, RN

## 2020-05-01 ENCOUNTER — Ambulatory Visit (INDEPENDENT_AMBULATORY_CARE_PROVIDER_SITE_OTHER): Payer: BC Managed Care – PPO | Admitting: Psychology

## 2020-05-01 DIAGNOSIS — F418 Other specified anxiety disorders: Secondary | ICD-10-CM

## 2020-05-03 ENCOUNTER — Other Ambulatory Visit: Payer: Self-pay | Admitting: Family Medicine

## 2020-05-03 DIAGNOSIS — M5416 Radiculopathy, lumbar region: Secondary | ICD-10-CM | POA: Insufficient documentation

## 2020-05-03 NOTE — Telephone Encounter (Signed)
Left message for pt to call me back 

## 2020-05-03 NOTE — Telephone Encounter (Signed)
Is this okay to refill? 

## 2020-05-03 NOTE — Telephone Encounter (Signed)
Please ask her how often she is taking ibuprofen 800 mg tablets.  I am okay with refill as long as she is not taking this more than 2 times per week for pain.  If she is needing this more than twice weekly, I recommend that she be evaluated

## 2020-05-03 NOTE — Telephone Encounter (Signed)
Pt states that she uses it as needed. She states 30 tablets last her a while. Advised her if needing it more than 2 times a week, she needs an appt

## 2020-05-08 ENCOUNTER — Ambulatory Visit (INDEPENDENT_AMBULATORY_CARE_PROVIDER_SITE_OTHER): Payer: BC Managed Care – PPO | Admitting: Family Medicine

## 2020-05-23 ENCOUNTER — Other Ambulatory Visit (INDEPENDENT_AMBULATORY_CARE_PROVIDER_SITE_OTHER): Payer: Self-pay | Admitting: Family Medicine

## 2020-05-23 DIAGNOSIS — F3289 Other specified depressive episodes: Secondary | ICD-10-CM

## 2020-05-29 ENCOUNTER — Ambulatory Visit (INDEPENDENT_AMBULATORY_CARE_PROVIDER_SITE_OTHER): Payer: BC Managed Care – PPO | Admitting: Psychology

## 2020-05-29 DIAGNOSIS — F418 Other specified anxiety disorders: Secondary | ICD-10-CM

## 2020-05-31 ENCOUNTER — Other Ambulatory Visit (INDEPENDENT_AMBULATORY_CARE_PROVIDER_SITE_OTHER): Payer: Self-pay

## 2020-05-31 ENCOUNTER — Other Ambulatory Visit (INDEPENDENT_AMBULATORY_CARE_PROVIDER_SITE_OTHER): Payer: Self-pay | Admitting: Family Medicine

## 2020-05-31 ENCOUNTER — Encounter (INDEPENDENT_AMBULATORY_CARE_PROVIDER_SITE_OTHER): Payer: Self-pay

## 2020-05-31 DIAGNOSIS — F3289 Other specified depressive episodes: Secondary | ICD-10-CM

## 2020-05-31 MED ORDER — ESCITALOPRAM OXALATE 20 MG PO TABS: 20.0000 mg | ORAL_TABLET | Freq: Every morning | ORAL | 0 refills | Status: DC

## 2020-06-13 ENCOUNTER — Encounter (INDEPENDENT_AMBULATORY_CARE_PROVIDER_SITE_OTHER): Payer: Self-pay | Admitting: Family Medicine

## 2020-06-13 ENCOUNTER — Other Ambulatory Visit: Payer: Self-pay | Admitting: Family Medicine

## 2020-06-13 ENCOUNTER — Other Ambulatory Visit: Payer: Self-pay

## 2020-06-13 ENCOUNTER — Ambulatory Visit (INDEPENDENT_AMBULATORY_CARE_PROVIDER_SITE_OTHER): Payer: BC Managed Care – PPO | Admitting: Family Medicine

## 2020-06-13 VITALS — BP 126/82 | HR 79 | Temp 97.8°F | Ht 63.0 in | Wt 241.0 lb

## 2020-06-13 DIAGNOSIS — Z9189 Other specified personal risk factors, not elsewhere classified: Secondary | ICD-10-CM | POA: Diagnosis not present

## 2020-06-13 DIAGNOSIS — Z6841 Body Mass Index (BMI) 40.0 and over, adult: Secondary | ICD-10-CM

## 2020-06-13 DIAGNOSIS — E559 Vitamin D deficiency, unspecified: Secondary | ICD-10-CM | POA: Diagnosis not present

## 2020-06-13 DIAGNOSIS — R7303 Prediabetes: Secondary | ICD-10-CM | POA: Diagnosis not present

## 2020-06-13 DIAGNOSIS — J4520 Mild intermittent asthma, uncomplicated: Secondary | ICD-10-CM

## 2020-06-13 DIAGNOSIS — F3289 Other specified depressive episodes: Secondary | ICD-10-CM | POA: Diagnosis not present

## 2020-06-13 MED ORDER — METFORMIN HCL 500 MG PO TABS: 500.0000 mg | ORAL_TABLET | Freq: Every day | ORAL | 0 refills | Status: DC

## 2020-06-13 MED ORDER — VITAMIN D (ERGOCALCIFEROL) 1.25 MG (50000 UNIT) PO CAPS: 50000.0000 [IU] | ORAL_CAPSULE | ORAL | 0 refills | Status: DC

## 2020-06-13 MED ORDER — ESCITALOPRAM OXALATE 20 MG PO TABS: 20.0000 mg | ORAL_TABLET | Freq: Every morning | ORAL | 0 refills | Status: DC

## 2020-06-14 MED ORDER — ALBUTEROL SULFATE HFA 108 (90 BASE) MCG/ACT IN AERS: 2.0000 | INHALATION_SPRAY | Freq: Four times a day (QID) | RESPIRATORY_TRACT | 0 refills | Status: DC | PRN

## 2020-06-14 NOTE — Progress Notes (Signed)
Chief Complaint:   OBESITY Vanessa Copeland is here to discuss her progress with her obesity treatment plan along with follow-up of her obesity related diagnoses. Vanessa Copeland is on the Category 3 Plan or keeping a food journal and adhering to recommended goals of 1300-1600 calories and 90+ grams of protein daily and states she is following her eating plan approximately 25% of the time. Vanessa Copeland states she is walking for 25-30 minutes 5 times per week.  Today's visit was #: 14 Starting weight: 256 lbs Starting date: 09/06/2019 Today's weight: 241 lbs Today's date: 06/13/2020 Total lbs lost to date: 15 Total lbs lost since last in-office visit: 0  Interim History: Vanessa Copeland's last visit was approximately 2 months ago, and she has gotten off track with her diet prescription and has gained weight. She is ready to get back on track, and she thinks she does better with a structured plan.  Subjective:   1. Pre-diabetes Vanessa Copeland notes increased polyphagia off her metformin and this is affecting her weight loss efforts. She would like to restart.  2. Vitamin D deficiency Vanessa Copeland is stable on Vit D, and she requests a refill today.  3. Other depression, with emotional eating Vanessa Copeland is stable on Lexapro, and she requests a refill today.  4. At risk for diabetes mellitus Vanessa Copeland is at higher than average risk for developing diabetes due to her obesity.   Assessment/Plan:   1. Pre-diabetes Vanessa Copeland will continue to work on weight loss, exercise, and decreasing simple carbohydrates to help decrease the risk of diabetes. Daly agreed to restart metformin 500 mg q daily with no refills.   - metFORMIN (GLUCOPHAGE) 500 MG tablet; Take 1 tablet (500 mg total) by mouth daily with breakfast.  Dispense: 30 tablet; Refill: 0  2. Vitamin D deficiency Low Vitamin D level contributes to fatigue and are associated with obesity, breast, and colon cancer. We will refill prescription Vitamin D for 1 month. Vanessa Copeland will follow-up  for routine testing of Vitamin D, at least 2-3 times per year to avoid over-replacement.  - Vitamin D, Ergocalciferol, (DRISDOL) 1.25 MG (50000 UNIT) CAPS capsule; Take 1 capsule (50,000 Units total) by mouth every 7 (seven) days.  Dispense: 4 capsule; Refill: 0  3. Other depression, with emotional eating Behavior modification techniques were discussed today to help Vanessa Copeland deal with her emotional/non-hunger eating behaviors. We will refill Lexapro for 1 month. Orders and follow up as documented in patient record.   - escitalopram (LEXAPRO) 20 MG tablet; Take 1 tablet (20 mg total) by mouth every morning.  Dispense: 14 tablet; Refill: 0  4. At risk for diabetes mellitus Vanessa Copeland was given approximately 15 minutes of diabetes education and counseling today. We discussed intensive lifestyle modifications today with an emphasis on weight loss as well as increasing exercise and decreasing simple carbohydrates in her diet. We also reviewed medication options with an emphasis on risk versus benefit of those discussed.   Repetitive spaced learning was employed today to elicit superior memory formation and behavioral change.  5. Class 3 severe obesity with serious comorbidity and body mass index (BMI) of 40.0 to 44.9 in adult, unspecified obesity type (HCC) Vanessa Copeland is currently in the action stage of change. As such, her goal is to get back to weightloss efforts . She has agreed to change to the Category 3 Plan.   Exercise goals: As is.  Behavioral modification strategies: meal planning and cooking strategies.  Vanessa Copeland has agreed to follow-up with our clinic in 2 to 3  weeks. She was informed of the importance of frequent follow-up visits to maximize her success with intensive lifestyle modifications for her multiple health conditions.   Objective:   Blood pressure 126/82, pulse 79, temperature 97.8 F (36.6 C), temperature source Oral, height 5\' 3"  (1.6 m), weight 241 lb (109.3 kg), SpO2 100 %. Body  mass index is 42.69 kg/m.  General: Cooperative, alert, well developed, in no acute distress. HEENT: Conjunctivae and lids unremarkable. Cardiovascular: Regular rhythm.  Lungs: Normal work of breathing. Neurologic: No focal deficits.   Lab Results  Component Value Date   CREATININE 0.74 03/28/2020   BUN 11 03/28/2020   NA 143 03/28/2020   K 4.3 03/28/2020   CL 104 03/28/2020   CO2 27 03/28/2020   Lab Results  Component Value Date   ALT 17 03/28/2020   AST 19 03/28/2020   ALKPHOS 94 03/28/2020   BILITOT 0.8 03/28/2020   Lab Results  Component Value Date   HGBA1C 5.4 03/28/2020   HGBA1C 5.1 01/02/2020   HGBA1C 5.6 09/06/2019   HGBA1C 5.7 (H) 05/04/2019   Lab Results  Component Value Date   INSULIN 6.6 03/28/2020   INSULIN 14.6 09/06/2019   Lab Results  Component Value Date   TSH 2.000 03/28/2020   Lab Results  Component Value Date   CHOL 180 03/28/2020   HDL 47 03/28/2020   LDLCALC 121 (H) 03/28/2020   TRIG 64 03/28/2020   Lab Results  Component Value Date   WBC 7.9 03/29/2020   HGB 11.4 03/29/2020   HCT 34.6 03/29/2020   MCV 88 03/29/2020   PLT 169 03/29/2020   Lab Results  Component Value Date   IRON 57 12/27/2019   TIBC 321 12/27/2019   FERRITIN 51 12/27/2019   Attestation Statements:   Reviewed by clinician on day of visit: allergies, medications, problem list, medical history, surgical history, family history, social history, and previous encounter notes.   I, Trixie Dredge, am acting as transcriptionist for Dennard Nip, MD.  I have reviewed the above documentation for accuracy and completeness, and I agree with the above. -  Dennard Nip, MD

## 2020-06-20 ENCOUNTER — Ambulatory Visit: Payer: BC Managed Care – PPO | Admitting: Psychology

## 2020-06-28 ENCOUNTER — Other Ambulatory Visit (INDEPENDENT_AMBULATORY_CARE_PROVIDER_SITE_OTHER): Payer: Self-pay | Admitting: Family Medicine

## 2020-06-28 DIAGNOSIS — F3289 Other specified depressive episodes: Secondary | ICD-10-CM

## 2020-07-01 ENCOUNTER — Other Ambulatory Visit (INDEPENDENT_AMBULATORY_CARE_PROVIDER_SITE_OTHER): Payer: Self-pay | Admitting: Family Medicine

## 2020-07-01 DIAGNOSIS — F3289 Other specified depressive episodes: Secondary | ICD-10-CM

## 2020-07-02 ENCOUNTER — Other Ambulatory Visit (INDEPENDENT_AMBULATORY_CARE_PROVIDER_SITE_OTHER): Payer: Self-pay | Admitting: Family Medicine

## 2020-07-02 DIAGNOSIS — F3289 Other specified depressive episodes: Secondary | ICD-10-CM

## 2020-07-02 MED ORDER — ESCITALOPRAM OXALATE 20 MG PO TABS: 20.0000 mg | ORAL_TABLET | Freq: Every morning | ORAL | 0 refills | Status: DC

## 2020-07-04 ENCOUNTER — Ambulatory Visit (INDEPENDENT_AMBULATORY_CARE_PROVIDER_SITE_OTHER): Payer: BC Managed Care – PPO | Admitting: Adult Health

## 2020-07-04 ENCOUNTER — Encounter (INDEPENDENT_AMBULATORY_CARE_PROVIDER_SITE_OTHER): Payer: Self-pay | Admitting: Adult Health

## 2020-07-04 ENCOUNTER — Other Ambulatory Visit: Payer: Self-pay

## 2020-07-04 VITALS — BP 126/85 | HR 71 | Temp 98.0°F | Ht 63.0 in | Wt 243.0 lb

## 2020-07-04 DIAGNOSIS — E559 Vitamin D deficiency, unspecified: Secondary | ICD-10-CM | POA: Diagnosis not present

## 2020-07-04 DIAGNOSIS — Z9189 Other specified personal risk factors, not elsewhere classified: Secondary | ICD-10-CM | POA: Diagnosis not present

## 2020-07-04 DIAGNOSIS — F3289 Other specified depressive episodes: Secondary | ICD-10-CM | POA: Diagnosis not present

## 2020-07-04 DIAGNOSIS — Z6841 Body Mass Index (BMI) 40.0 and over, adult: Secondary | ICD-10-CM

## 2020-07-04 MED ORDER — ESCITALOPRAM OXALATE 20 MG PO TABS: 20.0000 mg | ORAL_TABLET | Freq: Every morning | ORAL | 0 refills | Status: DC

## 2020-07-04 MED ORDER — VITAMIN D (ERGOCALCIFEROL) 1.25 MG (50000 UNIT) PO CAPS: 50000.0000 [IU] | ORAL_CAPSULE | ORAL | 0 refills | Status: DC

## 2020-07-04 NOTE — Progress Notes (Signed)
Chief Complaint:   OBESITY Vanessa Copeland is here to discuss her progress with her obesity treatment plan along with follow-up of her obesity related diagnoses. Vanessa Copeland is on the Category 3 Plan and states she is following her eating plan approximately 50% of the time. Vanessa Copeland states she is walking 30 minutes 2 times per week.  Today's visit was #: 15 Starting weight: 256 lbs Starting date: 09/06/2019 Today's weight: 243 lbs Today's date: 07/04/2020 Total lbs lost to date: 13 Total lbs lost since last in-office visit: 0  Interim History: Javanna feels that she may not be getting enough calories in and is often substituting food on the plan. She states "I just feel that I'm off the plan."  Subjective:   Vitamin D deficiency. Vanessa Copeland is on prescription strength Vitamin D supplementation and denies nausea, vomiting, or muscle weakness.    Ref. Range 03/28/2020 10:26  Vitamin D, 25-Hydroxy Latest Ref Range: 30.0 - 100.0 ng/mL 39.8   Other depression, with emotional eating. Vanessa Copeland is struggling with emotional eating and using food for comfort to the extent that it is negatively impacting her health. She has been working on behavior modification techniques to help reduce her emotional eating and has been somewhat successful. She shows no sign of suicidal or homicidal ideations. Kharizma reports stable mood and reports a significant decrease in her anxiety levels. She is on escitalopram 20 mg daily.  At risk for osteoporosis. Vanessa Copeland is at higher risk of osteopenia and osteoporosis due to Vitamin D deficiency and obesity.   Assessment/Plan:   Vitamin D deficiency. Low Vitamin D level contributes to fatigue and are associated with obesity, breast, and colon cancer. She was given a refill on her Vitamin D, Ergocalciferol, (DRISDOL) 1.25 MG (50000 UNIT) CAPS capsule every week #4 with 0 refills and will follow-up for routine testing of Vitamin D, at least 2-3 times per year to avoid over-replacement.    Other depression, with emotional eating. Low Vitamin D level contributes to fatigue and are associated with obesity, breast, and colon cancer. She agrees to continue to take prescription Vitamin D @50 ,000 IU every week and will follow-up for routine testing of Vitamin D, at least 2-3 times per year to avoid over-replacement. Refill was given for escitalopram (LEXAPRO) 20 MG tablet daily #30 with 0 refills.  At risk for osteoporosis. Vanessa Copeland was given approximately 15 minutes of osteoporosis prevention counseling today. Vanessa Copeland is at risk for osteopenia and osteoporosis due to her Vitamin D deficiency. She was encouraged to take her Vitamin D and follow her higher calcium diet and increase strengthening exercise to help strengthen her bones and decrease her risk of osteopenia and osteoporosis.  Repetitive spaced learning was employed today to elicit superior memory formation and behavioral change.  Class 3 severe obesity with serious comorbidity and body mass index (BMI) of 40.0 to 44.9 in adult, unspecified obesity type ([place]).  Vanessa Copeland is currently in the action stage of change. As such, her goal is to continue with weight loss efforts. She has agreed to following a lower carbohydrate, vegetable and lean protein rich diet plan.   Exercise goals: Vanessa Copeland will continue her current exercise regimen.   Behavioral modification strategies: increasing lean protein intake, no skipping meals, meal planning and cooking strategies and planning for success.  Vanessa Copeland has agreed to follow-up with our clinic in 2 weeks. She was informed of the importance of frequent follow-up visits to maximize her success with intensive lifestyle modifications for her multiple health  conditions.   Objective:   Blood pressure 126/85, pulse 71, temperature 98 F (36.7 C), temperature source Oral, height 5\' 3"  (1.6 m), weight 243 lb (110.2 kg), SpO2 100 %. Body mass index is 43.05 kg/m.  General: Cooperative, alert, well  developed, in no acute distress. HEENT: Conjunctivae and lids unremarkable. Cardiovascular: Regular rhythm.  Lungs: Normal work of breathing. Neurologic: No focal deficits.   Lab Results  Component Value Date   CREATININE 0.74 03/28/2020   BUN 11 03/28/2020   NA 143 03/28/2020   K 4.3 03/28/2020   CL 104 03/28/2020   CO2 27 03/28/2020   Lab Results  Component Value Date   ALT 17 03/28/2020   AST 19 03/28/2020   ALKPHOS 94 03/28/2020   BILITOT 0.8 03/28/2020   Lab Results  Component Value Date   HGBA1C 5.4 03/28/2020   HGBA1C 5.1 01/02/2020   HGBA1C 5.6 09/06/2019   HGBA1C 5.7 (H) 05/04/2019   Lab Results  Component Value Date   INSULIN 6.6 03/28/2020   INSULIN 14.6 09/06/2019   Lab Results  Component Value Date   TSH 2.000 03/28/2020   Lab Results  Component Value Date   CHOL 180 03/28/2020   HDL 47 03/28/2020   LDLCALC 121 (H) 03/28/2020   TRIG 64 03/28/2020   Lab Results  Component Value Date   WBC 7.9 03/29/2020   HGB 11.4 03/29/2020   HCT 34.6 03/29/2020   MCV 88 03/29/2020   PLT 169 03/29/2020   Lab Results  Component Value Date   IRON 57 12/27/2019   TIBC 321 12/27/2019   FERRITIN 51 12/27/2019   Attestation Statements:   Reviewed by clinician on day of visit: allergies, medications, problem list, medical history, surgical history, family history, social history, and previous encounter notes.  I, Michaelene Song, am acting as Location manager for [employer], NP-C   I have reviewed the above documentation for accuracy and completeness, and I agree with the above. -  Esaw Grandchild, NP

## 2020-07-13 ENCOUNTER — Other Ambulatory Visit: Payer: Self-pay | Admitting: Family Medicine

## 2020-07-13 DIAGNOSIS — J4520 Mild intermittent asthma, uncomplicated: Secondary | ICD-10-CM

## 2020-07-18 ENCOUNTER — Other Ambulatory Visit (INDEPENDENT_AMBULATORY_CARE_PROVIDER_SITE_OTHER): Payer: Self-pay | Admitting: Adult Health

## 2020-07-18 DIAGNOSIS — F3289 Other specified depressive episodes: Secondary | ICD-10-CM

## 2020-07-24 ENCOUNTER — Encounter (INDEPENDENT_AMBULATORY_CARE_PROVIDER_SITE_OTHER): Payer: Self-pay | Admitting: Physician Assistant

## 2020-07-24 ENCOUNTER — Other Ambulatory Visit: Payer: Self-pay

## 2020-07-24 ENCOUNTER — Ambulatory Visit (INDEPENDENT_AMBULATORY_CARE_PROVIDER_SITE_OTHER): Payer: BC Managed Care – PPO | Admitting: Physician Assistant

## 2020-07-24 VITALS — BP 147/84 | HR 62 | Temp 97.8°F | Ht 63.0 in | Wt 244.0 lb

## 2020-07-24 DIAGNOSIS — E559 Vitamin D deficiency, unspecified: Secondary | ICD-10-CM | POA: Diagnosis not present

## 2020-07-24 DIAGNOSIS — Z9189 Other specified personal risk factors, not elsewhere classified: Secondary | ICD-10-CM | POA: Diagnosis not present

## 2020-07-24 DIAGNOSIS — R7303 Prediabetes: Secondary | ICD-10-CM

## 2020-07-24 DIAGNOSIS — E66813 Obesity, class 3: Secondary | ICD-10-CM

## 2020-07-24 DIAGNOSIS — Z6841 Body Mass Index (BMI) 40.0 and over, adult: Secondary | ICD-10-CM

## 2020-07-24 DIAGNOSIS — E7849 Other hyperlipidemia: Secondary | ICD-10-CM | POA: Diagnosis not present

## 2020-07-24 DIAGNOSIS — F3289 Other specified depressive episodes: Secondary | ICD-10-CM

## 2020-07-24 MED ORDER — VITAMIN D (ERGOCALCIFEROL) 1.25 MG (50000 UNIT) PO CAPS: 50000.0000 [IU] | ORAL_CAPSULE | ORAL | 0 refills | Status: DC

## 2020-07-24 MED ORDER — ESCITALOPRAM OXALATE 20 MG PO TABS: 20.0000 mg | ORAL_TABLET | Freq: Every morning | ORAL | 0 refills | Status: DC

## 2020-07-24 NOTE — Progress Notes (Signed)
Chief Complaint:   OBESITY Vanessa Copeland is here to discuss her progress with her obesity treatment plan along with follow-up of her obesity related diagnoses. Vanessa Copeland is on the Category 3 Plan (low carb) and states she is following her eating plan approximately 50% of the time. Vanessa Copeland states she is walking 30 minutes 2 times per week.  Today's visit was #: 65 Starting weight: 256 lbs Starting date: 09/06/2019 Today's weight: 244 lbs Today's date: 07/24/2020 Total lbs lost to date: 12 Total lbs lost since last in-office visit: 0  Interim History: Vanessa Copeland states that she did not like the low carb plan because she could not eat her sweet snacks and would prefer to go back to a category plan. 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  Subjective:   Vitamin D deficiency. Vanessa Copeland is on prescription Vitamin D supplementation. No nausea, vomiting, or muscle weakness.    Ref. Range 03/28/2020 10:26  Vitamin D, 25-Hydroxy Latest Ref Range: 30.0 - 100.0 ng/mL 39.8   Other hyperlipidemia. Vanessa Copeland is on no medication. Last LDL was elevated.    Lab Results  Component Value Date   CHOL 180 03/28/2020   HDL 47 03/28/2020   LDLCALC 121 (H) 03/28/2020   TRIG 64 03/28/2020   Lab Results  Component Value Date   ALT 17 03/28/2020   AST 19 03/28/2020   ALKPHOS 94 03/28/2020   BILITOT 0.8 03/28/2020   The 10-year ASCVD risk score Mikey Bussing DC Jr., et al., 2013) is: 7.4%   Values used to calculate the score:     Age: 29 years     Sex: Female     Is Non-Hispanic African American: Yes     Diabetic: No     Tobacco smoker: No     Systolic Blood Pressure: 364 mmHg     Is BP treated: Yes     HDL Cholesterol: 47 mg/dL     Total Cholesterol: 180 mg/dL  Prediabetes. Vanessa Copeland has a diagnosis of prediabetes based on her elevated HgA1c and was informed this puts her at greater risk of developing diabetes. She continues to work on diet and exercise to decrease her risk of  diabetes. She denies nausea or hypoglycemia. Vanessa Copeland is on metformin, which she is tolerating well.  Lab Results  Component Value Date   HGBA1C 5.4 03/28/2020   Lab Results  Component Value Date   INSULIN 6.6 03/28/2020   INSULIN 14.6 09/06/2019   Other depression, with emotional eating. Vanessa Copeland is struggling with emotional eating and using food for comfort to the extent that it is negatively impacting her health. She has been working on behavior modification techniques to help reduce her emotional eating and has been somewhat successful. She shows no sign of suicidal or homicidal ideations. Vanessa Copeland is on Lexapro, which she is tolerating well.  At risk for diabetes mellitus. [name] is at higher than average risk for developing diabetes due to her obesity.   Assessment/Plan:   Vitamin D deficiency. Low Vitamin D level contributes to fatigue and are associated with obesity, breast, and colon cancer. She was given a refill on her Vitamin D, Ergocalciferol, (DRISDOL) 1.25 MG (50000 UNIT) CAPS capsule every week #4 with 0 refills and VITAMIN D 25 Hydroxy (Vit-D Deficiency, Fractures) level will be checked today.  Other hyperlipidemia. Cardiovascular risk and specific lipid/LDL goals reviewed.  We discussed several lifestyle modifications today and Enrika will continue to work on diet,  exercise and weight loss efforts. Orders and follow up as documented in patient record. Lipid panel will be checked today.  Counseling Intensive lifestyle modifications are the first line treatment for this issue. . Dietary changes: Increase soluble fiber. Decrease simple carbohydrates. . Exercise changes: Moderate to vigorous-intensity aerobic activity 150 minutes per week if tolerated. . Lipid-lowering medications: see documented in medical record.   Prediabetes. Vanessa Copeland will continue to work on weight loss, exercise, and decreasing simple carbohydrates to help decrease the risk of diabetes. She will continue her  medication as directed. Comprehensive metabolic panel, Hemoglobin A1c, Insulin, random labs will be checked today.  Other depression, with emotional eating. Behavior modification techniques were discussed today to help Pamela deal with her emotional/non-hunger eating behaviors.  Orders and follow up as documented in patient record. Refill was given for escitalopram (LEXAPRO) 20 MG tablet #30 with 0 refills.  At risk for diabetes mellitus. Vanessa Copeland was given approximately 15 minutes of diabetes education and counseling today. We discussed intensive lifestyle modifications today with an emphasis on weight loss as well as increasing exercise and decreasing simple carbohydrates in her diet. We also reviewed medication options with an emphasis on risk versus benefit of those discussed.   Repetitive spaced learning was employed today to elicit superior memory formation and behavioral change.  Class 3 severe obesity with serious comorbidity and body mass index (BMI) of 40.0 to 44.9 in adult, unspecified obesity type ([place]).  Vanessa Copeland is currently in the action stage of change. As such, her goal is to continue with weight loss efforts. She has agreed to the Category 3 Plan.   Exercise goals: For substantial health benefits, adults should do at least 150 minutes (2 hours and 30 minutes) a week of moderate-intensity, or 75 minutes (1 hour and 15 minutes) a week of vigorous-intensity aerobic physical activity, or an equivalent combination of moderate- and vigorous-intensity aerobic activity. Aerobic activity should be performed in episodes of at least 10 minutes, and preferably, it should be spread throughout the week.  Behavioral modification strategies: meal planning and cooking strategies and planning for success.  Vanessa Copeland has agreed to follow-up with our clinic in 3 weeks. She was informed of the importance of frequent follow-up visits to maximize her success with intensive lifestyle modifications for her  multiple health conditions.   Vanessa Copeland was informed we would discuss her lab results at her next visit unless there is a critical issue that needs to be addressed sooner. Eltha agreed to keep her next visit at the agreed upon time to discuss these results.  Objective:   Blood pressure (!) 147/84, pulse 62, temperature 97.8 F (36.6 C), temperature source Oral, height 5\' 3"  (1.6 m), weight 244 lb (110.7 kg), SpO2 98 %. Body mass index is 43.22 kg/m.  General: Cooperative, alert, well developed, in no acute distress. HEENT: Conjunctivae and lids unremarkable. Cardiovascular: Regular rhythm.  Lungs: Normal work of breathing. Neurologic: No focal deficits.   Lab Results  Component Value Date   CREATININE 0.74 03/28/2020   BUN 11 03/28/2020   NA 143 03/28/2020   K 4.3 03/28/2020   CL 104 03/28/2020   CO2 27 03/28/2020   Lab Results  Component Value Date   ALT 17 03/28/2020   AST 19 03/28/2020   ALKPHOS 94 03/28/2020   BILITOT 0.8 03/28/2020   Lab Results  Component Value Date   HGBA1C 5.4 03/28/2020   HGBA1C 5.1 01/02/2020   HGBA1C 5.6 09/06/2019   HGBA1C 5.7 (H) 05/04/2019  Lab Results  Component Value Date   INSULIN 6.6 03/28/2020   INSULIN 14.6 09/06/2019   Lab Results  Component Value Date   TSH 2.000 03/28/2020   Lab Results  Component Value Date   CHOL 180 03/28/2020   HDL 47 03/28/2020   LDLCALC 121 (H) 03/28/2020   TRIG 64 03/28/2020   Lab Results  Component Value Date   WBC 7.9 03/29/2020   HGB 11.4 03/29/2020   HCT 34.6 03/29/2020   MCV 88 03/29/2020   PLT 169 03/29/2020   Lab Results  Component Value Date   IRON 57 12/27/2019   TIBC 321 12/27/2019   FERRITIN 51 12/27/2019   Attestation Statements:   Reviewed by clinician on day of visit: allergies, medications, problem list, medical history, surgical history, family history, social history, and previous encounter notes.  IMichaelene Song, am acting as transcriptionist for Abby Potash,  PA-C   I have reviewed the above documentation for accuracy and completeness, and I agree with the above. Abby Potash, PA-C

## 2020-07-25 LAB — COMPREHENSIVE METABOLIC PANEL
ALT: 18 IU/L (ref 0–32)
AST: 19 IU/L (ref 0–40)
Albumin/Globulin Ratio: 1.4 (ref 1.2–2.2)
Albumin: 4.2 g/dL (ref 3.8–4.9)
Alkaline Phosphatase: 102 IU/L (ref 48–121)
BUN/Creatinine Ratio: 16 (ref 9–23)
BUN: 12 mg/dL (ref 6–24)
Bilirubin Total: 0.5 mg/dL (ref 0.0–1.2)
CO2: 27 mmol/L (ref 20–29)
Calcium: 9.2 mg/dL (ref 8.7–10.2)
Chloride: 103 mmol/L (ref 96–106)
Creatinine, Ser: 0.74 mg/dL (ref 0.57–1.00)
GFR calc Af Amer: 108 mL/min/{1.73_m2} (ref >59)
GFR calc non Af Amer: 93 mL/min/{1.73_m2} (ref >59)
Globulin, Total: 3 g/dL (ref 1.5–4.5)
Glucose: 88 mg/dL (ref 65–99)
Potassium: 4.3 mmol/L (ref 3.5–5.2)
Sodium: 143 mmol/L (ref 134–144)
Total Protein: 7.2 g/dL (ref 6.0–8.5)

## 2020-07-25 LAB — LIPID PANEL
Chol/HDL Ratio: 3.8 ratio (ref 0.0–4.4)
Cholesterol, Total: 192 mg/dL (ref 100–199)
HDL: 50 mg/dL (ref >39)
LDL Chol Calc (NIH): 130 mg/dL — ABNORMAL HIGH (ref 0–99)
Triglycerides: 63 mg/dL (ref 0–149)
VLDL Cholesterol Cal: 12 mg/dL (ref 5–40)

## 2020-07-25 LAB — INSULIN, RANDOM: INSULIN: 8.4 u[IU]/mL (ref 2.6–24.9)

## 2020-07-25 LAB — HEMOGLOBIN A1C
Est. average glucose Bld gHb Est-mCnc: 120 mg/dL
Hgb A1c MFr Bld: 5.8 % — ABNORMAL HIGH (ref 4.8–5.6)

## 2020-07-25 LAB — VITAMIN D 25 HYDROXY (VIT D DEFICIENCY, FRACTURES): Vit D, 25-Hydroxy: 31.6 ng/mL (ref 30.0–100.0)

## 2020-08-14 ENCOUNTER — Encounter (INDEPENDENT_AMBULATORY_CARE_PROVIDER_SITE_OTHER): Payer: Self-pay | Admitting: Physician Assistant

## 2020-08-14 ENCOUNTER — Other Ambulatory Visit: Payer: Self-pay

## 2020-08-14 ENCOUNTER — Ambulatory Visit (INDEPENDENT_AMBULATORY_CARE_PROVIDER_SITE_OTHER): Payer: BC Managed Care – PPO | Admitting: Physician Assistant

## 2020-08-14 VITALS — BP 160/83 | HR 71 | Temp 98.0°F | Ht 63.0 in | Wt 245.0 lb

## 2020-08-14 DIAGNOSIS — Z9189 Other specified personal risk factors, not elsewhere classified: Secondary | ICD-10-CM

## 2020-08-14 DIAGNOSIS — R7303 Prediabetes: Secondary | ICD-10-CM

## 2020-08-14 DIAGNOSIS — E559 Vitamin D deficiency, unspecified: Secondary | ICD-10-CM

## 2020-08-14 DIAGNOSIS — F3289 Other specified depressive episodes: Secondary | ICD-10-CM

## 2020-08-14 DIAGNOSIS — Z6841 Body Mass Index (BMI) 40.0 and over, adult: Secondary | ICD-10-CM

## 2020-08-14 MED ORDER — SAXENDA 18 MG/3ML ~~LOC~~ SOPN: 3.0000 mg | PEN_INJECTOR | Freq: Every day | SUBCUTANEOUS | 0 refills | Status: DC

## 2020-08-14 MED ORDER — ESCITALOPRAM OXALATE 20 MG PO TABS: 20.0000 mg | ORAL_TABLET | Freq: Every morning | ORAL | 0 refills | Status: DC

## 2020-08-14 MED ORDER — VITAMIN D (ERGOCALCIFEROL) 1.25 MG (50000 UNIT) PO CAPS: 50000.0000 [IU] | ORAL_CAPSULE | ORAL | 0 refills | Status: DC

## 2020-08-14 MED ORDER — INSULIN PEN NEEDLE 32G X 4 MM MISC: 0 refills | Status: DC

## 2020-08-14 NOTE — Progress Notes (Signed)
Chief Complaint:   OBESITY Vanessa Copeland is here to discuss her progress with her obesity treatment plan along with follow-up of her obesity related diagnoses. [name] is on the Category 3 Plan and states she is following her eating plan approximately 50% of the time. [name] states she is exercising 0 minutes 0 times per week.  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 She is not planning her meals. She is stressed with work and her blood pressure is elevated today. She notes not taking her metformin due to side effects of diarrhea.  Subjective:   Vitamin D deficiency. [name] is on prescription Vitamin D weekly. She reports that she forgets to take her Vitamin D.   Ref. Range 07/24/2020 13:43  Vitamin D, 25-Hydroxy Latest Ref Range: 30.0 - 100.0 ng/mL 31.6   Other depression, with emotional eating. [name] is struggling with emotional eating and using food for comfort to the extent that it is negatively impacting her health. She has been working on behavior modification techniques to help reduce her emotional eating and has been somewhat successful. She shows no sign of suicidal or homicidal ideations. [name] is on Lexapro.  Prediabetes. [name] has a diagnosis of prediabetes based on her elevated HgA1c and was informed this puts her at greater risk of developing diabetes. She continues to work on diet and exercise to decrease her risk of diabetes. She denies nausea or hypoglycemia. [name] is on metformin, but states she is not taking due to diarrhea when she takes it.  Lab Results  Component Value Date   HGBA1C 5.8 (H) 07/24/2020   Lab Results  Component Value Date   INSULIN 8.4 07/24/2020   INSULIN 6.6 03/28/2020   INSULIN 14.6 09/06/2019   At  risk for heart disease. [name] is at a higher than average risk for cardiovascular disease due to obesity.   Assessment/Plan:   Vitamin D deficiency. Low Vitamin D level contributes to fatigue and are associated with obesity, breast, and colon cancer. She was given a refill on her Vitamin D, Ergocalciferol, (DRISDOL) 1.25 MG (50000 UNIT) CAPS capsule every week #4 with 0 refills and will follow-up for routine testing of Vitamin D, at least 2-3 times per year to avoid over-replacement.   Other depression, with emotional eating. Behavior modification techniques were discussed today to help [name] deal with her emotional/non-hunger eating behaviors.  Orders and follow up as documented in patient record. Refill was given for escitalopram (LEXAPRO) 20 MG tablet #30 with 0 refills.  Prediabetes. [name] will continue to work on weight loss, exercise, and decreasing simple carbohydrates to help decrease the risk of diabetes. She will discontinue metformin when she starts GLP-1.  At risk for heart disease. [name] was given approximately 15 minutes of coronary artery disease prevention counseling today. She is 54 y.o. female and has risk factors for heart disease including obesity. We discussed intensive lifestyle modifications today with an emphasis on specific weight loss instructions and strategies.   Repetitive spaced learning was employed today to elicit superior memory formation and behavioral change.  Class 3 severe obesity with serious comorbidity and body mass index (BMI) of 40.0 to 44.9 in adult, unspecified obesity type ([place]). [name] will start Liraglutide -Weight Management (SAXENDA) 18 MG/3ML SOPN 0.6 mg  QAM daily #5 pens. Prescription was given for Insulin Pen Needle 32G X 4 MM MISC #100.  Brunilda is currently in the action stage of change. As such, her goal is to continue with weight loss efforts. She has agreed to the Category 3 Plan.   Exercise goals: For substantial health benefits, adults  should do at least 150 minutes (2 hours and 30 minutes) a week of moderate-intensity, or 75 minutes (1 hour and 15 minutes) a week of vigorous-intensity aerobic physical activity, or an equivalent combination of moderate- and vigorous-intensity aerobic activity. Aerobic activity should be performed in episodes of at least 10 minutes, and preferably, it should be spread throughout the week.  Behavioral modification strategies: meal planning and cooking strategies and keeping healthy foods in the home.  [name] has agreed to follow-up with our clinic in 3 weeks. She was informed of the importance of frequent follow-up visits to maximize her success with intensive lifestyle modifications for her multiple health conditions.   Objective:   Blood pressure (!) 160/83, pulse 71, temperature 98 F (36.7 C), temperature source Oral, height 5\' 3"  (1.6 m), weight 245 lb (111.1 kg), SpO2 99 %. Body mass index is 43.4 kg/m.  General: Cooperative, alert, well developed, in no acute distress. HEENT: Conjunctivae and lids unremarkable. Cardiovascular: Regular rhythm.  Lungs: Normal work of breathing. Neurologic: No focal deficits.   Lab Results  Component Value Date   CREATININE 0.74 07/24/2020   BUN 12 07/24/2020   NA 143 07/24/2020   K 4.3 07/24/2020   CL 103 07/24/2020   CO2 27 07/24/2020   Lab Results  Component Value Date   ALT 18 07/24/2020   AST 19 07/24/2020   ALKPHOS 102 07/24/2020   BILITOT 0.5 07/24/2020   Lab Results  Component Value Date   HGBA1C 5.8 (H) 07/24/2020   HGBA1C 5.4 03/28/2020   HGBA1C 5.1 01/02/2020   HGBA1C 5.6 09/06/2019   HGBA1C 5.7 (H) 05/04/2019   Lab Results  Component Value Date   INSULIN 8.4 07/24/2020   INSULIN 6.6 03/28/2020   INSULIN 14.6 09/06/2019   Lab Results  Component Value Date   TSH 2.000 03/28/2020   Lab Results  Component Value Date   CHOL 192 07/24/2020   HDL 50 07/24/2020   LDLCALC 130 (H) 07/24/2020   TRIG 63 07/24/2020    CHOLHDL 3.8 07/24/2020   Lab Results  Component Value Date   WBC 7.9 03/29/2020   HGB 11.4 03/29/2020   HCT 34.6 03/29/2020   MCV 88 03/29/2020   PLT 169 03/29/2020   Lab Results  Component Value Date   IRON 57 12/27/2019   TIBC 321 12/27/2019   FERRITIN 51 12/27/2019   Attestation Statements:   Reviewed by clinician on day of visit: allergies, medications, problem list, medical history, surgical history, family history, social history, and previous encounter notes.  IMichaelene Song, am acting as transcriptionist for Abby Potash, PA-C   I have reviewed the above documentation for accuracy and completeness, and I agree with the above. Abby Potash, PA-C

## 2020-08-29 ENCOUNTER — Other Ambulatory Visit: Payer: BC Managed Care – PPO

## 2020-09-04 ENCOUNTER — Other Ambulatory Visit: Payer: Self-pay

## 2020-09-04 ENCOUNTER — Ambulatory Visit (INDEPENDENT_AMBULATORY_CARE_PROVIDER_SITE_OTHER): Payer: BC Managed Care – PPO | Admitting: Physician Assistant

## 2020-09-04 ENCOUNTER — Encounter (INDEPENDENT_AMBULATORY_CARE_PROVIDER_SITE_OTHER): Payer: Self-pay | Admitting: Physician Assistant

## 2020-09-04 VITALS — BP 119/75 | HR 66 | Temp 97.8°F | Ht 63.0 in | Wt 246.0 lb

## 2020-09-04 DIAGNOSIS — Z9189 Other specified personal risk factors, not elsewhere classified: Secondary | ICD-10-CM | POA: Diagnosis not present

## 2020-09-04 DIAGNOSIS — F3289 Other specified depressive episodes: Secondary | ICD-10-CM

## 2020-09-04 DIAGNOSIS — Z6841 Body Mass Index (BMI) 40.0 and over, adult: Secondary | ICD-10-CM

## 2020-09-04 DIAGNOSIS — E559 Vitamin D deficiency, unspecified: Secondary | ICD-10-CM

## 2020-09-04 NOTE — Progress Notes (Signed)
Chief Complaint:   OBESITY Vanessa Copeland is here to discuss her progress with her obesity treatment plan along with follow-up of her obesity related diagnoses. Vanessa Copeland is on the Category 3 Plan and states she is following her eating plan approximately 60% of the time. Vanessa Copeland states she is walking for 25 minutes 2 times per week.  Today's visit was #: 18 Starting weight: 256 lbs Starting date: 09/06/2019 Today's weight: 246 lbs Today's date: 09/04/2020 Total lbs lost to date: 10 Total lbs lost since last in-office visit: 0  Interim History: Vanessa Copeland reports that Vanessa Copeland is helping with her cravings. She has been packing her breakfast and lunch for work more often. She states she is not eating enough for dinner.  Subjective:   1. Vitamin D deficiency Vanessa Copeland is on Vit D, and she is tolerating it well. She is walking outside more often.  2. Other depression, with emotional eating Vanessa Copeland is on Lexapro, and she denies suicidal or homicidal ideas. She notes she is sleeping well.  3. At risk for osteoporosis Vanessa Copeland is at higher risk of osteopenia and osteoporosis due to Vitamin D deficiency.   Assessment/Plan:   1. Vitamin D deficiency Low Vitamin D level contributes to fatigue and are associated with obesity, breast, and colon cancer. We will refill prescription Vitamin D for 1 month. Vanessa Copeland will follow-up for routine testing of Vitamin D, at least 2-3 times per year to avoid over-replacement.  - Vitamin D, Ergocalciferol, (DRISDOL) 1.25 MG (50000 UNIT) CAPS capsule; Take 1 capsule (50,000 Units total) by mouth every 7 (seven) days.  Dispense: 4 capsule; Refill: 0  2. Other depression, with emotional eating Behavior modification techniques were discussed today to help Vanessa Copeland deal with her emotional/non-hunger eating behaviors. We will refill Lexapro for 1 month. Orders and follow up as documented in patient record.   - escitalopram (LEXAPRO) 20 MG tablet; Take 1 tablet (20 mg total) by mouth  every morning.  Dispense: 30 tablet; Refill: 0  3. At risk for osteoporosis Vanessa Copeland was given approximately 15 minutes of osteoporosis prevention counseling today. Vanessa Copeland is at risk for osteopenia and osteoporosis due to her Vitamin D deficiency. She was encouraged to take her Vitamin D and follow her higher calcium diet and increase strengthening exercise to help strengthen her bones and decrease her risk of osteopenia and osteoporosis.  Repetitive spaced learning was employed today to elicit superior memory formation and behavioral change.  4. Class 3 severe obesity with serious comorbidity and body mass index (BMI) of 40.0 to 44.9 in adult, unspecified obesity type (HCC) Vanessa Copeland is currently in the action stage of change. As such, her goal is to continue with weight loss efforts. She has agreed to the Category 3 Plan.   Exercise goals: As is.  Behavioral modification strategies: increasing lean protein intake and no skipping meals.  Vanessa Copeland has agreed to follow-up with our clinic in 3 weeks. She was informed of the importance of frequent follow-up visits to maximize her success with intensive lifestyle modifications for her multiple health conditions.   Objective:   Blood pressure 119/75, pulse 66, temperature 97.8 F (36.6 C), temperature source Oral, height 5\' 3"  (1.6 m), weight 246 lb (111.6 kg), SpO2 98 %. Body mass index is 43.58 kg/m.  General: Cooperative, alert, well developed, in no acute distress. HEENT: Conjunctivae and lids unremarkable. Cardiovascular: Regular rhythm.  Lungs: Normal work of breathing. Neurologic: No focal deficits.   Lab Results  Component Value Date   CREATININE 0.74  07/24/2020   BUN 12 07/24/2020   NA 143 07/24/2020   K 4.3 07/24/2020   CL 103 07/24/2020   CO2 27 07/24/2020   Lab Results  Component Value Date   ALT 18 07/24/2020   AST 19 07/24/2020   ALKPHOS 102 07/24/2020   BILITOT 0.5 07/24/2020   Lab Results  Component Value Date    HGBA1C 5.8 (H) 07/24/2020   HGBA1C 5.4 03/28/2020   HGBA1C 5.1 01/02/2020   HGBA1C 5.6 09/06/2019   HGBA1C 5.7 (H) 05/04/2019   Lab Results  Component Value Date   INSULIN 8.4 07/24/2020   INSULIN 6.6 03/28/2020   INSULIN 14.6 09/06/2019   Lab Results  Component Value Date   TSH 2.000 03/28/2020   Lab Results  Component Value Date   CHOL 192 07/24/2020   HDL 50 07/24/2020   LDLCALC 130 (H) 07/24/2020   TRIG 63 07/24/2020   CHOLHDL 3.8 07/24/2020   Lab Results  Component Value Date   WBC 7.9 03/29/2020   HGB 11.4 03/29/2020   HCT 34.6 03/29/2020   MCV 88 03/29/2020   PLT 169 03/29/2020   Lab Results  Component Value Date   IRON 57 12/27/2019   TIBC 321 12/27/2019   FERRITIN 51 12/27/2019   Attestation Statements:   Reviewed by clinician on day of visit: allergies, medications, problem list, medical history, surgical history, family history, social history, and previous encounter notes.   Wilhemena Durie, am acting as transcriptionist for [employer], PA-C.  I have reviewed the above documentation for accuracy and completeness, and I agree with the above. Abby Potash, PA-C

## 2020-09-05 MED ORDER — ESCITALOPRAM OXALATE 20 MG PO TABS: 20.0000 mg | ORAL_TABLET | Freq: Every morning | ORAL | 0 refills | Status: DC

## 2020-09-05 MED ORDER — VITAMIN D (ERGOCALCIFEROL) 1.25 MG (50000 UNIT) PO CAPS: 50000.0000 [IU] | ORAL_CAPSULE | ORAL | 0 refills | Status: DC

## 2020-09-11 ENCOUNTER — Ambulatory Visit: Payer: BC Managed Care – PPO | Admitting: Family Medicine

## 2020-09-11 ENCOUNTER — Other Ambulatory Visit (INDEPENDENT_AMBULATORY_CARE_PROVIDER_SITE_OTHER): Payer: BC Managed Care – PPO

## 2020-09-11 ENCOUNTER — Other Ambulatory Visit: Payer: Self-pay

## 2020-09-11 ENCOUNTER — Encounter: Payer: Self-pay | Admitting: Family Medicine

## 2020-09-11 VITALS — BP 128/82 | HR 79 | Temp 98.6°F | Wt 248.8 lb

## 2020-09-11 DIAGNOSIS — M26629 Arthralgia of temporomandibular joint, unspecified side: Secondary | ICD-10-CM | POA: Diagnosis not present

## 2020-09-11 DIAGNOSIS — Z23 Encounter for immunization: Secondary | ICD-10-CM | POA: Diagnosis not present

## 2020-09-11 NOTE — Progress Notes (Signed)
   Subjective:    Patient ID: Vanessa Copeland, female    DOB: 1966/02/17, 54 y.o.   MRN: [id number]  HPI She has a history of right TMJ trouble and recently saw her dentist.  She is being referred to physical therapy to help with this.  In the last several days she has noted a right earache but cannot necessarily relate this to jaw motion.  It is intermittent in nature but no fever, chills, sore throat.   Review of Systems     Objective:   Physical Exam Alert and in no distress.  The right and left TM and canal totally normal.  Slight tenderness over the right TMJ.  Throat is clear.  Neck is supple without adenopathy.       Assessment & Plan:  TMJ pain dysfunction syndrome I explained that I thought her right earache was really more from TMJ and encouraged her to go ahead and go to the physical therapist.  Discussed cutting back on foods that require a lot of chewing.

## 2020-09-12 ENCOUNTER — Other Ambulatory Visit (INDEPENDENT_AMBULATORY_CARE_PROVIDER_SITE_OTHER): Payer: Self-pay | Admitting: Physician Assistant

## 2020-09-12 DIAGNOSIS — Z6841 Body Mass Index (BMI) 40.0 and over, adult: Secondary | ICD-10-CM

## 2020-09-12 NOTE — Telephone Encounter (Signed)
Sent to provider for approval

## 2020-09-21 LAB — HM MAMMOGRAPHY

## 2020-09-24 ENCOUNTER — Ambulatory Visit (INDEPENDENT_AMBULATORY_CARE_PROVIDER_SITE_OTHER): Payer: BC Managed Care – PPO | Admitting: Family Medicine

## 2020-09-24 ENCOUNTER — Other Ambulatory Visit: Payer: Self-pay

## 2020-09-24 ENCOUNTER — Encounter (INDEPENDENT_AMBULATORY_CARE_PROVIDER_SITE_OTHER): Payer: Self-pay | Admitting: Family Medicine

## 2020-09-24 VITALS — BP 146/78 | HR 72 | Temp 97.9°F | Ht 63.0 in | Wt 248.0 lb

## 2020-09-24 DIAGNOSIS — F3289 Other specified depressive episodes: Secondary | ICD-10-CM | POA: Diagnosis not present

## 2020-09-24 DIAGNOSIS — E66813 Obesity, class 3: Secondary | ICD-10-CM

## 2020-09-24 DIAGNOSIS — Z9189 Other specified personal risk factors, not elsewhere classified: Secondary | ICD-10-CM | POA: Diagnosis not present

## 2020-09-24 DIAGNOSIS — R7303 Prediabetes: Secondary | ICD-10-CM | POA: Diagnosis not present

## 2020-09-24 DIAGNOSIS — Z6841 Body Mass Index (BMI) 40.0 and over, adult: Secondary | ICD-10-CM | POA: Diagnosis not present

## 2020-09-24 MED ORDER — ESCITALOPRAM OXALATE 20 MG PO TABS: 20.0000 mg | ORAL_TABLET | Freq: Every morning | ORAL | 0 refills | Status: DC

## 2020-09-24 NOTE — Progress Notes (Signed)
Chief Complaint:   OBESITY Vanessa Copeland is here to discuss her progress with her obesity treatment plan along with follow-up of her obesity related diagnoses. Vanessa Copeland is on the Category 3 Plan and states she is following her eating plan approximately 75% of the time. Vanessa Copeland states she is walking for 30 minutes 2 times per week.  Today's visit was #: 31 Starting weight: 256 lbs Starting date: 09/06/2019 Today's weight: 248 lbs Today's date: 09/24/2020 Total lbs lost to date: 8 Total lbs lost since last in-office visit: 0  Interim History: Vanessa Copeland is on Saxenda 1.2 mg daily. She notes lack of motivation to stick to plan.. She denies nausea or constipation. She is not getting all of the prescribed protein in at dinner.  Subjective:   1. Other depression, with emotional eating Vanessa Copeland's Lexapro was started for anxiety about 9-10 months ago, and she feels her anxiety is well controlled.  2. Pre-diabetes  Vanessa Copeland is on Saxenda and she denies polyphagia.  Lab Results  Component Value Date   HGBA1C 5.8 (H) 07/24/2020   Lab Results  Component Value Date   INSULIN 8.4 07/24/2020   INSULIN 6.6 03/28/2020   INSULIN 14.6 09/06/2019   3. At risk for nausea Vanessa Copeland is at risk for nausea due to increased dose of Saxenda.  Assessment/Plan:   1. Other depression, with emotional eating We will refill Lexapro for 1 month.   - escitalopram (LEXAPRO) 20 MG tablet; Take 1 tablet (20 mg total) by mouth every morning.  Dispense: 30 tablet; Refill: 0  2. Pre-diabetes Clarabel will continue Saxenda.  3. At risk for nausea Vanessa Copeland was given approximately 15 minutes of nausea prevention counseling today. Vanessa Copeland is at risk for nausea due to her new or current medication. She was encouraged to titrate her medication slowly, make sure to stay hydrated, eat smaller portions throughout the day, and avoid high fat meals.   4. Class 3 severe obesity with serious comorbidity and body mass index (BMI) of  40.0 to 44.9 in adult, unspecified obesity type (HCC) Vanessa Copeland is currently in the action stage of change. As such, her goal is to continue with weight loss efforts. She has agreed to the Category 3 Plan.   Vanessa Copeland is to have a protein snack in the late afternoon.  We discussed various medication options to help [facility] with her weight loss efforts and we both agreed to increase Saxenda to 1.8 mg daily, She may take 1.5 mg (1.2 plus 5 clicks) if she experiences nausea at 1.8 mg.   Exercise goals: As is, increase walking to 3 times per week.  Behavioral modification strategies: increasing lean protein intake, decreasing simple carbohydrates and better snacking choices.  Vanessa Copeland has agreed to follow-up with our clinic in 3 weeks with Abby Potash, PA-C.  Objective:   Blood pressure (!) 146/78, pulse 72, temperature 97.9 F (36.6 C), temperature source Oral, height 5\' 3"  (1.6 m), weight 248 lb (112.5 kg), SpO2 99 %. Body mass index is 43.93 kg/m.  General: Cooperative, alert, well developed, in no acute distress. HEENT: Conjunctivae and lids unremarkable. Cardiovascular: Regular rhythm.  Lungs: Normal work of breathing. Neurologic: No focal deficits.   Lab Results  Component Value Date   CREATININE 0.74 07/24/2020   BUN 12 07/24/2020   NA 143 07/24/2020   K 4.3 07/24/2020   CL 103 07/24/2020   CO2 27 07/24/2020   Lab Results  Component Value Date   ALT 18 07/24/2020   AST  19 07/24/2020   ALKPHOS 102 07/24/2020   BILITOT 0.5 07/24/2020   Lab Results  Component Value Date   HGBA1C 5.8 (H) 07/24/2020   HGBA1C 5.4 03/28/2020   HGBA1C 5.1 01/02/2020   HGBA1C 5.6 09/06/2019   HGBA1C 5.7 (H) 05/04/2019   Lab Results  Component Value Date   INSULIN 8.4 07/24/2020   INSULIN 6.6 03/28/2020   INSULIN 14.6 09/06/2019   Lab Results  Component Value Date   TSH 2.000 03/28/2020   Lab Results  Component Value Date   CHOL 192 07/24/2020   HDL 50 07/24/2020   LDLCALC 130 (H)  07/24/2020   TRIG 63 07/24/2020   CHOLHDL 3.8 07/24/2020   Lab Results  Component Value Date   WBC 7.9 03/29/2020   HGB 11.4 03/29/2020   HCT 34.6 03/29/2020   MCV 88 03/29/2020   PLT 169 03/29/2020   Lab Results  Component Value Date   IRON 57 12/27/2019   TIBC 321 12/27/2019   FERRITIN 51 12/27/2019   Attestation Statements:   Reviewed by clinician on day of visit: allergies, medications, problem list, medical history, surgical history, family history, social history, and previous encounter notes.   Wilhemena Durie, am acting as Location manager for [employer], FNP-C.  I have reviewed the above documentation for accuracy and completeness, and I agree with the above. -  Georgianne Fick, FNP

## 2020-09-25 ENCOUNTER — Encounter (INDEPENDENT_AMBULATORY_CARE_PROVIDER_SITE_OTHER): Payer: Self-pay | Admitting: Family Medicine

## 2020-09-27 ENCOUNTER — Encounter: Payer: Self-pay | Admitting: Internal Medicine

## 2020-10-15 ENCOUNTER — Ambulatory Visit (INDEPENDENT_AMBULATORY_CARE_PROVIDER_SITE_OTHER): Payer: BC Managed Care – PPO | Admitting: Physician Assistant

## 2020-10-15 ENCOUNTER — Encounter (INDEPENDENT_AMBULATORY_CARE_PROVIDER_SITE_OTHER): Payer: Self-pay | Admitting: Physician Assistant

## 2020-10-15 ENCOUNTER — Other Ambulatory Visit: Payer: Self-pay

## 2020-10-15 VITALS — BP 128/75 | HR 78 | Temp 97.7°F | Ht 63.0 in | Wt 245.0 lb

## 2020-10-15 DIAGNOSIS — Z6841 Body Mass Index (BMI) 40.0 and over, adult: Secondary | ICD-10-CM

## 2020-10-15 DIAGNOSIS — F3289 Other specified depressive episodes: Secondary | ICD-10-CM

## 2020-10-15 DIAGNOSIS — Z9189 Other specified personal risk factors, not elsewhere classified: Secondary | ICD-10-CM | POA: Diagnosis not present

## 2020-10-15 DIAGNOSIS — E559 Vitamin D deficiency, unspecified: Secondary | ICD-10-CM

## 2020-10-15 MED ORDER — ESCITALOPRAM OXALATE 20 MG PO TABS: 20.0000 mg | ORAL_TABLET | Freq: Every morning | ORAL | 0 refills | Status: DC

## 2020-10-15 MED ORDER — SAXENDA 18 MG/3ML ~~LOC~~ SOPN: 3.0000 mg | PEN_INJECTOR | Freq: Every day | SUBCUTANEOUS | 0 refills | Status: DC

## 2020-10-15 MED ORDER — INSULIN PEN NEEDLE 32G X 4 MM MISC: 0 refills | Status: DC

## 2020-10-16 ENCOUNTER — Ambulatory Visit (INDEPENDENT_AMBULATORY_CARE_PROVIDER_SITE_OTHER): Payer: BC Managed Care – PPO | Admitting: Physician Assistant

## 2020-10-17 NOTE — Progress Notes (Signed)
Chief Complaint:   OBESITY Vanessa Copeland is here to discuss her progress with her obesity treatment plan along with follow-up of her obesity related diagnoses. Vanessa Copeland is on the Category 3 Plan and states she is following her eating plan approximately 80% of the time. Vanessa Copeland states she is walking/cardio and strengthening 60 minutes 4 times per week.  Today's visit was #: 20 Starting weight: 256 lbs Starting date: 09/06/2019 Today's weight: 245 lbs Today's date: 10/15/2020 Total lbs lost to date: 11 Total lbs lost since last in-office visit: 3  Interim History: Vanessa Copeland feels that the last few weeks she has been exercising more and she has been following the plan better. She reports doing less snacking on Saxenda 1.8 mg.  Subjective:   Other depression, with emotional eating. Vanessa Copeland is struggling with emotional eating and using food for comfort to the extent that it is negatively impacting her health. She has been working on behavior modification techniques to help reduce her emotional eating and has been somewhat successful. She shows no sign of suicidal or homicidal ideations. Vanessa Copeland is on Lexapro and denies cravings.  Vitamin D deficiency. Vanessa Copeland is on Vitamin D weekly. No nausea, vomiting, or muscle weakness.    Ref. Range 07/24/2020 13:43  Vitamin D, 25-Hydroxy Latest Ref Range: 30.0 - 100.0 ng/mL 31.6   At risk for nausea. Vanessa Copeland is at increased risk for nausea from Saxenda.  Assessment/Plan:   Other depression, with emotional eating. Behavior modification techniques were discussed today to help Vanessa Copeland deal with her emotional/non-hunger eating behaviors.  Orders and follow up as documented in patient record. Refill was given for escitalopram (LEXAPRO) 20 MG tablet #90 with 0 refills.  Vitamin D deficiency. Low Vitamin D level contributes to fatigue and are associated with obesity, breast, and colon cancer. She agrees to continue to take Vitamin D as directed and will follow-up  for routine testing of Vitamin D, at least 2-3 times per year to avoid over-replacement.  At risk for nausea. Vanessa Copeland was given approximately 15 minutes of nausea prevention counseling today. Vanessa Copeland is at risk for nausea due to her new or current medication. She was encouraged to titrate her medication slowly, make sure to stay hydrated, eat smaller portions throughout the day, and avoid high fat meals.   Class 3 severe obesity with serious comorbidity and body mass index (BMI) of 40.0 to 44.9 in adult, unspecified obesity type (Vanessa Copeland). Refills were given for Liraglutide -Weight Management (SAXENDA) 18 MG/3ML SOPN #5 with 0 refills and Insulin Pen Needle 32G X 4 MM MISC #100.  Vanessa Copeland is currently in the action stage of change. As such, her goal is to continue with weight loss efforts. She has agreed to the Category 3 Plan.   Exercise goals: For substantial health benefits, adults should do at least 150 minutes (2 hours and 30 minutes) a week of moderate-intensity, or 75 minutes (1 hour and 15 minutes) a week of vigorous-intensity aerobic physical activity, or an equivalent combination of moderate- and vigorous-intensity aerobic activity. Aerobic activity should be performed in episodes of at least 10 minutes, and preferably, it should be spread throughout the week.  Behavioral modification strategies: meal planning and cooking strategies and planning for success.  Vanessa Copeland has agreed to follow-up with our clinic in 3 weeks. She was informed of the importance of frequent follow-up visits to maximize her success with intensive lifestyle modifications for her multiple health conditions.   Objective:   Blood pressure 128/75, pulse  78, temperature 97.7 F (36.5 C), temperature source Oral, height 5\' 3"  (1.6 m), weight 245 lb (111.1 kg), SpO2 98 %. Body mass index is 43.4 kg/m.  General: Cooperative, alert, well developed, in no acute distress. HEENT: Conjunctivae and lids  unremarkable. Cardiovascular: Regular rhythm.  Lungs: Normal work of breathing. Neurologic: No focal deficits.   Lab Results  Component Value Date   CREATININE 0.74 07/24/2020   BUN 12 07/24/2020   NA 143 07/24/2020   K 4.3 07/24/2020   CL 103 07/24/2020   CO2 27 07/24/2020   Lab Results  Component Value Date   ALT 18 07/24/2020   AST 19 07/24/2020   ALKPHOS 102 07/24/2020   BILITOT 0.5 07/24/2020   Lab Results  Component Value Date   HGBA1C 5.8 (H) 07/24/2020   HGBA1C 5.4 03/28/2020   HGBA1C 5.1 01/02/2020   HGBA1C 5.6 09/06/2019   HGBA1C 5.7 (H) 05/04/2019   Lab Results  Component Value Date   INSULIN 8.4 07/24/2020   INSULIN 6.6 03/28/2020   INSULIN 14.6 09/06/2019   Lab Results  Component Value Date   TSH 2.000 03/28/2020   Lab Results  Component Value Date   CHOL 192 07/24/2020   HDL 50 07/24/2020   LDLCALC 130 (H) 07/24/2020   TRIG 63 07/24/2020   CHOLHDL 3.8 07/24/2020   Lab Results  Component Value Date   WBC 7.9 03/29/2020   HGB 11.4 03/29/2020   HCT 34.6 03/29/2020   MCV 88 03/29/2020   PLT 169 03/29/2020   Lab Results  Component Value Date   IRON 57 12/27/2019   TIBC 321 12/27/2019   FERRITIN 51 12/27/2019   Attestation Statements:   Reviewed by clinician on day of visit: allergies, medications, problem list, medical history, surgical history, family history, social history, and previous encounter notes.  IMichaelene Song, am acting as transcriptionist for Abby Potash, PA-C   I have reviewed the above documentation for accuracy and completeness, and I agree with the above. Abby Potash, PA-C

## 2020-11-01 ENCOUNTER — Other Ambulatory Visit: Payer: Self-pay | Admitting: Family Medicine

## 2020-11-05 ENCOUNTER — Other Ambulatory Visit: Payer: Self-pay

## 2020-11-05 ENCOUNTER — Ambulatory Visit (INDEPENDENT_AMBULATORY_CARE_PROVIDER_SITE_OTHER): Payer: BC Managed Care – PPO | Admitting: Physician Assistant

## 2020-11-05 ENCOUNTER — Encounter (INDEPENDENT_AMBULATORY_CARE_PROVIDER_SITE_OTHER): Payer: Self-pay | Admitting: Physician Assistant

## 2020-11-05 VITALS — BP 155/81 | HR 63 | Temp 97.4°F | Ht 63.0 in | Wt 241.0 lb

## 2020-11-05 DIAGNOSIS — Z9189 Other specified personal risk factors, not elsewhere classified: Secondary | ICD-10-CM

## 2020-11-05 DIAGNOSIS — Z6841 Body Mass Index (BMI) 40.0 and over, adult: Secondary | ICD-10-CM | POA: Diagnosis not present

## 2020-11-05 DIAGNOSIS — R7303 Prediabetes: Secondary | ICD-10-CM | POA: Diagnosis not present

## 2020-11-05 DIAGNOSIS — E559 Vitamin D deficiency, unspecified: Secondary | ICD-10-CM | POA: Diagnosis not present

## 2020-11-05 MED ORDER — VITAMIN D (ERGOCALCIFEROL) 1.25 MG (50000 UNIT) PO CAPS: 50000.0000 [IU] | ORAL_CAPSULE | ORAL | 0 refills | Status: DC

## 2020-11-05 NOTE — Progress Notes (Signed)
Chief Complaint:   Vanessa Copeland is here to discuss her progress with her Vanessa treatment plan along with follow-up of her Vanessa related diagnoses. Vanessa Copeland is on the Category 3 Plan and states she is following her eating plan approximately 80% of the time. Vanessa Copeland states she is walking 30 minutes 5 times per week and exercise classes 30 minutes 5 times per week.  Today's visit was #: 21 Starting weight: 256 lbs Starting date: 09/06/2019 Today's weight: 241 lbs Today's date: 11/05/2020 Total lbs lost to date: 15 Total lbs lost since last in-office visit: 4  Interim History: Vanessa Copeland is on Saxenda 2.4 mg and is not excessively hungry, but is able to eat all of her food on the plan. She is exercising more and eating more protein.  Subjective:   Vitamin D deficiency. Vanessa Copeland is on prescription Vitamin D weekly. Energy is low per the patient, especially in the middle of the day.   Ref. Range 07/24/2020 13:43  Vitamin D, 25-Hydroxy Latest Ref Range: 30.0 - 100.0 ng/mL 31.6   Prediabetes. Vanessa Copeland has a diagnosis of prediabetes based on her elevated HgA1c and was informed this puts her at greater risk of developing diabetes. She continues to work on diet and exercise to decrease her risk of diabetes. She denies nausea or hypoglycemia. Vanessa Copeland denies polyphagia. Last A1c 5.8. She is due for labs at her next visit.  Lab Results  Component Value Date   HGBA1C 5.8 (H) 07/24/2020   Lab Results  Component Value Date   INSULIN 8.4 07/24/2020   INSULIN 6.6 03/28/2020   INSULIN 14.6 09/06/2019   At risk for diabetes mellitus. Vanessa Copeland is at higher than average risk for developing diabetes due to Vanessa.   Assessment/Plan:   Vitamin D deficiency. Low Vitamin D level contributes to fatigue and are associated with Vanessa, breast, and colon cancer. She was given a refill on her Vitamin D, Ergocalciferol, (DRISDOL) 1.25 MG (50000 UNIT) CAPS capsule every week #4 with 0 refills and will  follow-up for routine testing of Vitamin D, at least 2-3 times per year to avoid over-replacement.   Prediabetes. Vanessa Copeland will continue to work on weight loss, exercise, and decreasing simple carbohydrates to help decrease the risk of diabetes. A1c will be checked at her next visit.  At risk for diabetes mellitus. Vanessa Copeland was given approximately 15 minutes of diabetes education and counseling today. We discussed intensive lifestyle modifications today with an emphasis on weight loss as well as increasing exercise and decreasing simple carbohydrates in her diet. We also reviewed medication options with an emphasis on risk versus benefit of those discussed.   Repetitive spaced learning was employed today to elicit superior memory formation and behavioral change.  Class 3 severe Vanessa with serious comorbidity and body mass index (BMI) of 40.0 to 44.9 in adult, unspecified Vanessa type (Vanessa Copeland).  Vanessa Copeland is currently in the action stage of change. As such, her goal is to continue with weight loss efforts. She has agreed to the Category 3 Plan.   Labs will be checked at her next office visit.  Exercise goals: For substantial health benefits, adults should do at least 150 minutes (2 hours and 30 minutes) a week of moderate-intensity, or 75 minutes (1 hour and 15 minutes) a week of vigorous-intensity aerobic physical activity, or an equivalent combination of moderate- and vigorous-intensity aerobic activity. Aerobic activity should be performed in episodes of at least 10 minutes, and preferably, it should be spread throughout the  week.  Behavioral modification strategies: meal planning and cooking strategies and holiday eating strategies .  Vanessa Copeland has agreed to follow-up with our clinic in 2-3 weeks. She was informed of the importance of frequent follow-up visits to maximize her success with intensive lifestyle modifications for her multiple health conditions.   Objective:   Blood pressure (!) 155/81,  pulse 63, temperature (!) 97.4 F (36.3 C), height 5\' 3"  (1.6 m), weight 241 lb (109.3 kg), SpO2 99 %. Body mass index is 42.69 kg/m.  General: Cooperative, alert, well developed, in no acute distress. HEENT: Conjunctivae and lids unremarkable. Cardiovascular: Regular rhythm.  Lungs: Normal work of breathing. Neurologic: No focal deficits.   Lab Results  Component Value Date   CREATININE 0.74 07/24/2020   BUN 12 07/24/2020   NA 143 07/24/2020   K 4.3 07/24/2020   CL 103 07/24/2020   CO2 27 07/24/2020   Lab Results  Component Value Date   ALT 18 07/24/2020   AST 19 07/24/2020   ALKPHOS 102 07/24/2020   BILITOT 0.5 07/24/2020   Lab Results  Component Value Date   HGBA1C 5.8 (H) 07/24/2020   HGBA1C 5.4 03/28/2020   HGBA1C 5.1 01/02/2020   HGBA1C 5.6 09/06/2019   HGBA1C 5.7 (H) 05/04/2019   Lab Results  Component Value Date   INSULIN 8.4 07/24/2020   INSULIN 6.6 03/28/2020   INSULIN 14.6 09/06/2019   Lab Results  Component Value Date   TSH 2.000 03/28/2020   Lab Results  Component Value Date   CHOL 192 07/24/2020   HDL 50 07/24/2020   LDLCALC 130 (H) 07/24/2020   TRIG 63 07/24/2020   CHOLHDL 3.8 07/24/2020   Lab Results  Component Value Date   WBC 7.9 03/29/2020   HGB 11.4 03/29/2020   HCT 34.6 03/29/2020   MCV 88 03/29/2020   PLT 169 03/29/2020   Lab Results  Component Value Date   IRON 57 12/27/2019   TIBC 321 12/27/2019   FERRITIN 51 12/27/2019   Attestation Statements:   Reviewed by clinician on day of visit: allergies, medications, problem list, medical history, surgical history, family history, social history, and previous encounter notes.  IMichaelene Song, am acting as transcriptionist for Abby Potash, PA-C   I have reviewed the above documentation for accuracy and completeness, and I agree with the above. Abby Potash, PA-C

## 2020-11-06 MED ORDER — IBUPROFEN 800 MG PO TABS
800.0000 mg | ORAL_TABLET | Freq: Three times a day (TID) | ORAL | 1 refills | Status: DC | PRN
Start: 2020-11-06 — End: 2021-06-27

## 2020-11-06 NOTE — Addendum Note (Signed)
Addended by: Minette Headland A on: 11/06/2020 09:41 AM   Modules accepted: Orders

## 2020-11-27 ENCOUNTER — Ambulatory Visit (INDEPENDENT_AMBULATORY_CARE_PROVIDER_SITE_OTHER): Payer: BC Managed Care – PPO | Admitting: Physician Assistant

## 2020-11-27 ENCOUNTER — Encounter (INDEPENDENT_AMBULATORY_CARE_PROVIDER_SITE_OTHER): Payer: Self-pay | Admitting: Physician Assistant

## 2020-11-27 ENCOUNTER — Other Ambulatory Visit: Payer: Self-pay

## 2020-11-27 VITALS — BP 120/76 | HR 67 | Temp 97.6°F | Ht 63.0 in | Wt 239.0 lb

## 2020-11-27 DIAGNOSIS — R7303 Prediabetes: Secondary | ICD-10-CM

## 2020-11-27 DIAGNOSIS — Z9189 Other specified personal risk factors, not elsewhere classified: Secondary | ICD-10-CM

## 2020-11-27 DIAGNOSIS — F3289 Other specified depressive episodes: Secondary | ICD-10-CM | POA: Diagnosis not present

## 2020-11-27 DIAGNOSIS — E559 Vitamin D deficiency, unspecified: Secondary | ICD-10-CM | POA: Diagnosis not present

## 2020-11-27 DIAGNOSIS — E7849 Other hyperlipidemia: Secondary | ICD-10-CM

## 2020-11-27 DIAGNOSIS — Z6841 Body Mass Index (BMI) 40.0 and over, adult: Secondary | ICD-10-CM

## 2020-11-27 MED ORDER — VITAMIN D (ERGOCALCIFEROL) 1.25 MG (50000 UNIT) PO CAPS: 50000.0000 [IU] | ORAL_CAPSULE | ORAL | 0 refills | Status: DC

## 2020-11-27 MED ORDER — SAXENDA 18 MG/3ML ~~LOC~~ SOPN: 3.0000 mg | PEN_INJECTOR | Freq: Every day | SUBCUTANEOUS | 0 refills | Status: DC

## 2020-11-27 MED ORDER — ESCITALOPRAM OXALATE 20 MG PO TABS: 20.0000 mg | ORAL_TABLET | Freq: Every morning | ORAL | 0 refills | Status: DC

## 2020-11-27 NOTE — Progress Notes (Signed)
Chief Complaint:   OBESITY Vanessa Copeland is here to discuss her progress with her obesity treatment plan along with follow-up of her obesity related diagnoses. Vanessa Copeland is on the Category 3 Plan and states she is following her eating plan approximately 80% of the time. Vanessa Copeland states she is walking 60 minutes 4 times per week.  Today's visit was #: 22 Starting weight: 256 lbs Starting date: 09/06/2019 Today's weight: 239 lbs Today's date: 11/27/2020 Total lbs lost to date: 17 Total lbs lost since last in-office visit: 2  Interim History: Vanessa Copeland did very well with portion control over the Thanksgiving holiday. She increased her Saxenda by 2 clicks recently due to an increase in snacking.  Subjective:   Other depression, with emotional eating. Vanessa Copeland is struggling with emotional eating and using food for comfort to the extent that it is negatively impacting her health. She has been working on behavior modification techniques to help reduce her emotional eating and has been somewhat successful. She shows no sign of suicidal or homicidal ideations. Vanessa Copeland is on Lexapro and reports stable mood.  Vitamin D deficiency. Vanessa Copeland is on prescription Vitamin D. No nausea, vomiting, or muscle weakness. She is due for labs.   Ref. Range 07/24/2020 13:43  Vitamin D, 25-Hydroxy Latest Ref Range: 30.0 - 100.0 ng/mL 31.6   Prediabetes. Vanessa Copeland has a diagnosis of prediabetes based on her elevated HgA1c and was informed this puts her at greater risk of developing diabetes. She continues to work on diet and exercise to decrease her risk of diabetes. She denies nausea or hypoglycemia. Vanessa Copeland is on Saxenda, which she reports is controlling her appetite well. No nausea, vomiting, or diarrhea.   Lab Results  Component Value Date   HGBA1C 5.8 (H) 07/24/2020   Lab Results  Component Value Date   INSULIN 8.4 07/24/2020   INSULIN 6.6 03/28/2020   INSULIN 14.6 09/06/2019   Other hyperlipidemia. Vanessa Copeland is on  no medication. No chest pain. She is exercising regularly. She is due for labs.  Lab Results  Component Value Date   CHOL 192 07/24/2020   HDL 50 07/24/2020   LDLCALC 130 (H) 07/24/2020   TRIG 63 07/24/2020   CHOLHDL 3.8 07/24/2020   Lab Results  Component Value Date   ALT 18 07/24/2020   AST 19 07/24/2020   ALKPHOS 102 07/24/2020   BILITOT 0.5 07/24/2020   The 10-year ASCVD risk score Mikey Bussing DC Jr., et al., 2013) is: 3.6%   Values used to calculate the score:     Age: 54 years     Sex: Female     Is Non-Hispanic African American: Yes     Diabetic: No     Tobacco smoker: No     Systolic Blood Pressure: 962 mmHg     Is BP treated: Yes     HDL Cholesterol: 50 mg/dL     Total Cholesterol: 192 mg/dL  At risk for heart disease. Dezeray is at a higher than average risk for cardiovascular disease due to obesity.   Assessment/Plan:   Other depression, with emotional eating. Behavior modification techniques were discussed today to help Vanessa Copeland deal with her emotional/non-hunger eating behaviors.  Orders and follow up as documented in patient record. Refill was given for escitalopram (LEXAPRO) 20 MG tablet #90 with 0 refills.  Vitamin D deficiency. Low Vitamin D level contributes to fatigue and are associated with obesity, breast, and colon cancer. She was given a refill on her Vitamin D, Ergocalciferol, (DRISDOL)  1.25 MG (50000 UNIT) CAPS capsule every week #4 with 0 refills and VITAMIN D 25 Hydroxy (Vit-D Deficiency, Fractures) level will be checked today.   Prediabetes. Vanessa Copeland will continue to work on weight loss, exercise, and decreasing simple carbohydrates to help decrease the risk of diabetes. Labs will be checked today.   Other hyperlipidemia. Cardiovascular risk and specific lipid/LDL goals reviewed.  We discussed several lifestyle modifications today and Vanessa Copeland will continue to work on diet, exercise and weight loss efforts. Orders and follow up as documented in patient record.  Labs will be checked today.   Counseling Intensive lifestyle modifications are the first line treatment for this issue. . Dietary changes: Increase soluble fiber. Decrease simple carbohydrates. . Exercise changes: Moderate to vigorous-intensity aerobic activity 150 minutes per week if tolerated. . Lipid-lowering medications: see documented in medical record.  At risk for heart disease. Vanessa Copeland was given approximately 15 minutes of coronary artery disease prevention counseling today. She is 54 y.o. female and has risk factors for heart disease including obesity. We discussed intensive lifestyle modifications today with an emphasis on specific weight loss instructions and strategies.   Repetitive spaced learning was employed today to elicit superior memory formation and behavioral change.  Class 3 severe obesity with serious comorbidity and body mass index (BMI) of 40.0 to 44.9 in adult, unspecified obesity type (Vanessa Copeland). Refill was given for Liraglutide -Weight Management (SAXENDA) 18 MG/3ML SOPN #5 with 0 refills.  Vanessa Copeland is currently in the action stage of change. As such, her goal is to continue with weight loss efforts. She has agreed to the Category 3 Plan.   Exercise goals: For substantial health benefits, adults should do at least 150 minutes (2 hours and 30 minutes) a week of moderate-intensity, or 75 minutes (1 hour and 15 minutes) a week of vigorous-intensity aerobic physical activity, or an equivalent combination of moderate- and vigorous-intensity aerobic activity. Aerobic activity should be performed in episodes of at least 10 minutes, and preferably, it should be spread throughout the week.  Behavioral modification strategies: meal planning and cooking strategies and planning for success.  Vanessa Copeland has agreed to follow-up with our clinic in 4 weeks. She was informed of the importance of frequent follow-up visits to maximize her success with intensive lifestyle modifications for her  multiple health conditions.   Vanessa Copeland was informed we would discuss her lab results at her next visit unless there is a critical issue that needs to be addressed sooner. Vanessa Copeland agreed to keep her next visit at the agreed upon time to discuss these results.  Objective:   Blood pressure 120/76, pulse 67, temperature 97.6 F (36.4 C), height 5\' 3"  (1.6 m), weight 239 lb (108.4 kg), SpO2 99 %. Body mass index is 42.34 kg/m.  General: Cooperative, alert, well developed, in no acute distress. HEENT: Conjunctivae and lids unremarkable. Cardiovascular: Regular rhythm.  Lungs: Normal work of breathing. Neurologic: No focal deficits.   Lab Results  Component Value Date   CREATININE 0.74 07/24/2020   BUN 12 07/24/2020   NA 143 07/24/2020   K 4.3 07/24/2020   CL 103 07/24/2020   CO2 27 07/24/2020   Lab Results  Component Value Date   ALT 18 07/24/2020   AST 19 07/24/2020   ALKPHOS 102 07/24/2020   BILITOT 0.5 07/24/2020   Lab Results  Component Value Date   HGBA1C 5.8 (H) 07/24/2020   HGBA1C 5.4 03/28/2020   HGBA1C 5.1 01/02/2020   HGBA1C 5.6 09/06/2019   HGBA1C 5.7 (H) 05/04/2019  Lab Results  Component Value Date   INSULIN 8.4 07/24/2020   INSULIN 6.6 03/28/2020   INSULIN 14.6 09/06/2019   Lab Results  Component Value Date   TSH 2.000 03/28/2020   Lab Results  Component Value Date   CHOL 192 07/24/2020   HDL 50 07/24/2020   LDLCALC 130 (H) 07/24/2020   TRIG 63 07/24/2020   CHOLHDL 3.8 07/24/2020   Lab Results  Component Value Date   WBC 7.9 03/29/2020   HGB 11.4 03/29/2020   HCT 34.6 03/29/2020   MCV 88 03/29/2020   PLT 169 03/29/2020   Lab Results  Component Value Date   IRON 57 12/27/2019   TIBC 321 12/27/2019   FERRITIN 51 12/27/2019   Attestation Statements:   Reviewed by clinician on day of visit: allergies, medications, problem list, medical history, surgical history, family history, social history, and previous encounter notes.  IMichaelene Song, am acting as transcriptionist for Abby Potash, PA-C   I have reviewed the above documentation for accuracy and completeness, and I agree with the above. Abby Potash, PA-C

## 2020-11-28 LAB — COMPREHENSIVE METABOLIC PANEL
ALT: 21 IU/L (ref 0–32)
AST: 17 IU/L (ref 0–40)
Albumin/Globulin Ratio: 1.4 (ref 1.2–2.2)
Albumin: 4.3 g/dL (ref 3.8–4.9)
Alkaline Phosphatase: 89 IU/L (ref 44–121)
BUN/Creatinine Ratio: 17 (ref 9–23)
BUN: 14 mg/dL (ref 6–24)
Bilirubin Total: 0.5 mg/dL (ref 0.0–1.2)
CO2: 25 mmol/L (ref 20–29)
Calcium: 9.2 mg/dL (ref 8.7–10.2)
Chloride: 104 mmol/L (ref 96–106)
Creatinine, Ser: 0.81 mg/dL (ref 0.57–1.00)
GFR calc Af Amer: 96 mL/min/{1.73_m2} (ref >59)
GFR calc non Af Amer: 83 mL/min/{1.73_m2} (ref >59)
Globulin, Total: 3 g/dL (ref 1.5–4.5)
Glucose: 83 mg/dL (ref 65–99)
Potassium: 4.5 mmol/L (ref 3.5–5.2)
Sodium: 143 mmol/L (ref 134–144)
Total Protein: 7.3 g/dL (ref 6.0–8.5)

## 2020-11-28 LAB — VITAMIN D 25 HYDROXY (VIT D DEFICIENCY, FRACTURES): Vit D, 25-Hydroxy: 36.8 ng/mL (ref 30.0–100.0)

## 2020-11-28 LAB — LIPID PANEL
Chol/HDL Ratio: 4 ratio (ref 0.0–4.4)
Cholesterol, Total: 178 mg/dL (ref 100–199)
HDL: 44 mg/dL (ref >39)
LDL Chol Calc (NIH): 121 mg/dL — ABNORMAL HIGH (ref 0–99)
Triglycerides: 69 mg/dL (ref 0–149)
VLDL Cholesterol Cal: 13 mg/dL (ref 5–40)

## 2020-11-28 LAB — HEMOGLOBIN A1C
Est. average glucose Bld gHb Est-mCnc: 114 mg/dL
Hgb A1c MFr Bld: 5.6 % (ref 4.8–5.6)

## 2020-11-28 LAB — INSULIN, RANDOM: INSULIN: 3.8 u[IU]/mL (ref 2.6–24.9)

## 2020-12-31 ENCOUNTER — Other Ambulatory Visit: Payer: Self-pay

## 2020-12-31 ENCOUNTER — Encounter (INDEPENDENT_AMBULATORY_CARE_PROVIDER_SITE_OTHER): Payer: Self-pay | Admitting: Physician Assistant

## 2020-12-31 ENCOUNTER — Ambulatory Visit (INDEPENDENT_AMBULATORY_CARE_PROVIDER_SITE_OTHER): Payer: BC Managed Care – PPO | Admitting: Physician Assistant

## 2020-12-31 VITALS — BP 120/81 | HR 67 | Temp 98.4°F | Ht 63.0 in | Wt 238.0 lb

## 2020-12-31 DIAGNOSIS — Z6841 Body Mass Index (BMI) 40.0 and over, adult: Secondary | ICD-10-CM | POA: Diagnosis not present

## 2020-12-31 DIAGNOSIS — E559 Vitamin D deficiency, unspecified: Secondary | ICD-10-CM | POA: Diagnosis not present

## 2020-12-31 DIAGNOSIS — Z9189 Other specified personal risk factors, not elsewhere classified: Secondary | ICD-10-CM

## 2020-12-31 DIAGNOSIS — F3289 Other specified depressive episodes: Secondary | ICD-10-CM

## 2020-12-31 MED ORDER — ESCITALOPRAM OXALATE 20 MG PO TABS: 20.0000 mg | ORAL_TABLET | Freq: Every morning | ORAL | 0 refills | Status: DC

## 2020-12-31 MED ORDER — VITAMIN D (ERGOCALCIFEROL) 1.25 MG (50000 UNIT) PO CAPS: 50000.0000 [IU] | ORAL_CAPSULE | ORAL | 0 refills | Status: DC

## 2020-12-31 MED ORDER — SAXENDA 18 MG/3ML ~~LOC~~ SOPN: 3.0000 mg | PEN_INJECTOR | Freq: Every day | SUBCUTANEOUS | 0 refills | Status: DC

## 2021-01-01 NOTE — Progress Notes (Unsigned)
Chief Complaint:   OBESITY Vanessa Copeland is here to discuss her progress with her obesity treatment plan along with follow-up of her obesity related diagnoses. Vanessa Copeland is on the Category 3 Plan and states she is following her eating plan approximately 50% of the time. Vanessa Copeland states she is walking and working out for 60 minutes 4 times per week.  Today's visit was #: 23 Starting weight: 256 lbs Starting date: 09/06/2019 Today's weight: 238 lbs Today's date: 12/31/2020 Total lbs lost to date: 18 Total lbs lost since last in-office visit: 1  Interim History: Vanessa Copeland is on 2.7 mg of Saxenda, which she states is helping control her appetite. She is having trouble getting in all of her protein during the day. She has an appointment for a breast reduction consultation this week.  Subjective:   1. Vitamin D deficiency Vanessa Copeland missed some doses over the last few months. I discussed labs with the patient today.  2. Other depression, with emotional eating Vanessa Copeland denies suicidal ideas or homicidal ideas. She is on Lexapro and she denies emotional eating.  3. At risk for osteoporosis Vanessa Copeland is at higher risk of osteopenia and osteoporosis due to Vitamin D deficiency.   Assessment/Plan:   1. Vitamin D deficiency Low Vitamin D level contributes to fatigue and are associated with obesity, breast, and colon cancer. We will refill prescription Vitamin D for 1 month. Vanessa Copeland will follow-up for routine testing of Vitamin D, at least 2-3 times per year to avoid over-replacement.  - Vitamin D, Ergocalciferol, (DRISDOL) 1.25 MG (50000 UNIT) CAPS capsule; Take 1 capsule (50,000 Units total) by mouth every 7 (seven) days.  Dispense: 4 capsule; Refill: 0  2. Other depression, with emotional eating Behavior modification techniques were discussed today to help Vanessa Copeland deal with her emotional/non-hunger eating behaviors. We will refill Lexapro for 90 days with no refills. Orders and follow up as documented in patient  record.   - escitalopram (LEXAPRO) 20 MG tablet; Take 1 tablet (20 mg total) by mouth every morning.  Dispense: 90 tablet; Refill: 0  3. At risk for osteoporosis Vanessa Copeland was given approximately 15 minutes of osteoporosis prevention counseling today. Vanessa Copeland is at risk for osteopenia and osteoporosis due to her Vitamin D deficiency. She was encouraged to take her Vitamin D and follow her higher calcium diet and increase strengthening exercise to help strengthen her bones and decrease her risk of osteopenia and osteoporosis.  Repetitive spaced learning was employed today to elicit superior memory formation and behavioral change.  4. Class 3 severe obesity with serious comorbidity and body mass index (BMI) of 40.0 to 44.9 in adult, unspecified obesity type ([facility]) Vanessa Copeland is currently in the action stage of change. As such, her goal is to continue with weight loss efforts. She has agreed to the Category 3 Plan.   We discussed various medication options to help Vanessa Copeland with her weight loss efforts and we both agreed to continue Saxenda, and we will refill for 1 month.  - Liraglutide -Weight Management (SAXENDA) 18 MG/3ML SOPN; Inject 3 mg into the skin daily.  Dispense: 15 mL; Refill: 0  Exercise goals: As is.  Behavioral modification strategies: increasing lean protein intake and keeping healthy foods in the home.  Vanessa Copeland has agreed to follow-up with our clinic in 3 weeks. She was informed of the importance of frequent follow-up visits to maximize her success with intensive lifestyle modifications for her multiple health conditions.   Objective:   Blood pressure 120/81, pulse 67, temperature  98.4 F (36.9 C), height 5\' 3"  (1.6 m), weight 238 lb (108 kg), SpO2 99 %. Body mass index is 42.16 kg/m.  General: Cooperative, alert, well developed, in no acute distress. HEENT: Conjunctivae and lids unremarkable. Cardiovascular: Regular rhythm.  Lungs: Normal work of breathing. Neurologic: No focal  deficits.   Lab Results  Component Value Date   CREATININE 0.81 11/27/2020   BUN 14 11/27/2020   NA 143 11/27/2020   K 4.5 11/27/2020   CL 104 11/27/2020   CO2 25 11/27/2020   Lab Results  Component Value Date   ALT 21 11/27/2020   AST 17 11/27/2020   ALKPHOS 89 11/27/2020   BILITOT 0.5 11/27/2020   Lab Results  Component Value Date   HGBA1C 5.6 11/27/2020   HGBA1C 5.8 (H) 07/24/2020   HGBA1C 5.4 03/28/2020   HGBA1C 5.1 01/02/2020   HGBA1C 5.6 09/06/2019   Lab Results  Component Value Date   INSULIN 3.8 11/27/2020   INSULIN 8.4 07/24/2020   INSULIN 6.6 03/28/2020   INSULIN 14.6 09/06/2019   Lab Results  Component Value Date   TSH 2.000 03/28/2020   Lab Results  Component Value Date   CHOL 178 11/27/2020   HDL 44 11/27/2020   LDLCALC 121 (H) 11/27/2020   TRIG 69 11/27/2020   CHOLHDL 4.0 11/27/2020   Lab Results  Component Value Date   WBC 7.9 03/29/2020   HGB 11.4 03/29/2020   HCT 34.6 03/29/2020   MCV 88 03/29/2020   PLT 169 03/29/2020   Lab Results  Component Value Date   IRON 57 12/27/2019   TIBC 321 12/27/2019   FERRITIN 51 12/27/2019   Attestation Statements:   Reviewed by clinician on day of visit: allergies, medications, problem list, medical history, surgical history, family history, social history, and previous encounter notes.   Wilhemena Durie, am acting as transcriptionist for [employer], PA-C.  I have reviewed the above documentation for accuracy and completeness, and I agree with the above. Abby Potash, PA-C

## 2021-01-04 ENCOUNTER — Ambulatory Visit: Payer: BC Managed Care – PPO | Admitting: Plastic Surgery

## 2021-01-04 ENCOUNTER — Other Ambulatory Visit: Payer: Self-pay

## 2021-01-04 ENCOUNTER — Encounter: Payer: Self-pay | Admitting: Plastic Surgery

## 2021-01-04 DIAGNOSIS — M542 Cervicalgia: Secondary | ICD-10-CM

## 2021-01-04 DIAGNOSIS — M546 Pain in thoracic spine: Secondary | ICD-10-CM | POA: Diagnosis not present

## 2021-01-04 DIAGNOSIS — N62 Hypertrophy of breast: Secondary | ICD-10-CM | POA: Insufficient documentation

## 2021-01-04 DIAGNOSIS — M549 Dorsalgia, unspecified: Secondary | ICD-10-CM | POA: Insufficient documentation

## 2021-01-04 DIAGNOSIS — G8929 Other chronic pain: Secondary | ICD-10-CM | POA: Diagnosis not present

## 2021-01-04 NOTE — Progress Notes (Signed)
Patient ID: Vanessa Copeland, female    DOB: [birth year], 55 y.o.   MRN: [id number]   Chief Complaint  Patient presents with  . Advice Only  . Breast Problem    Mammary Hyperplasia: The patient is a 55 y.o. female with a history of mammary hyperplasia for several years.  She has extremely large breasts causing symptoms that include the following: Back pain in the upper and lower back, including neck pain. She pulls or pins her bra straps to provide better lift and relief of the pressure and pain. She notices relief by holding her breast up manually.  Her shoulder straps cause grooves and pain and pressure that requires padding for relief. Pain medication is sometimes required with motrin and tylenol.  Activities that are hindered by enlarged breasts include: exercise and running.  She has tried supportive clothing as well as fitted bras without improvement.  Her breasts are extremely large and fairly symmetric.  She has hyperpigmentation of the inframammary area on both sides.  The sternal to nipple distance on the right is 44 cm and the left is 44 cm.  The IMF distance is 20 cm.  She is 5 feet 3 inches tall and weighs 239 pounds.  Preoperative bra size = H cup.  She would like to be a C or D cup.  The estimated excess breast tissue to be removed at the time of surgery = 700 grams on the left and 700 grams on the right.  Mammogram history: October 2021 and was negative we will need to have the results sent to [place].  Family history of breast cancer: None.  Tobacco use: None.  Patient has not had physical therapy for this to date.   Review of Systems  Constitutional: Positive for activity change. Negative for appetite change.  HENT: Negative.   Eyes: Negative.   Respiratory: Negative.  Negative for chest tightness and shortness of breath.   Cardiovascular: Negative for leg swelling.  Gastrointestinal: Negative for abdominal distention.  Endocrine: Negative.   Genitourinary: Negative.    Musculoskeletal: Negative.   Skin: Negative.   Psychiatric/Behavioral: Negative.     Past Medical History:  Diagnosis Date  . Allergy   . Anemia   . Anxiety   . Asthma    mild intermittent(when allergies are flaring)  . Complication of anesthesia   . Food allergy    Shellfish  . H/O echocardiogram 12/2005   Trace MR, mild TR, nl L ventricular systolic function.  Marland Kitchen Heart murmur    since birth. Never has had any problems  . Hepatic steatosis    per CT  . History of kidney stones   . Kidney stones   . Morbid obesity ([place]) 04/29/2019  . Obesity, unspecified   . PONV (postoperative nausea and vomiting)   . Vitamin D deficiency 01/2010    Past Surgical History:  Procedure Laterality Date  . CESAREAN SECTION     x3  . COLONOSCOPY    . CYSTOSCOPY WITH RETROGRADE PYELOGRAM, URETEROSCOPY AND STENT PLACEMENT Left 07/16/2018   Procedure: CYSTOSCOPY WITH RETROGRADE PYELOGRAM, URETEROSCOPY AND STENT PLACEMENT;  Surgeon: Cleon Gustin, MD;  Location: [facility];  Service: Urology;  Laterality: Left;  . EXTRACORPOREAL SHOCK WAVE LITHOTRIPSY Left 06/21/2018   Procedure: LEFT EXTRACORPOREAL SHOCK WAVE LITHOTRIPSY (ESWL);  Surgeon: Franchot Gallo, MD;  Location: [facility];  Service: Urology;  Laterality: Left;  . HOLMIUM LASER APPLICATION Left [id number]/86/5784   Procedure: HOLMIUM LASER APPLICATION;  Surgeon: Nicolette Bang  L, MD;  Location: [facility];  Service: Urology;  Laterality: Left;  . [place] SURGERY  2019  . OOPHORECTOMY  2002   left due to cyst  . POLYPECTOMY    . TUBAL LIGATION  1996      Current Outpatient Medications:  .  albuterol (VENTOLIN HFA) 108 (90 Base) MCG/ACT inhaler, TAKE 2 PUFFS BY MOUTH EVERY 6 HOURS AS NEEDED FOR WHEEZE OR SHORTNESS OF BREATH, Disp: 18 g, Rfl: 0 .  EPINEPHrine 0.3 mg/0.3 mL IJ SOAJ injection, Inject 0.3 mLs (0.3 mg total) into the muscle as needed for anaphylaxis., Disp: 1 each, Rfl: 1 .  escitalopram (LEXAPRO) 20 MG tablet, Take 1 tablet (20 mg  total) by mouth every morning., Disp: 90 tablet, Rfl: 0 .  ibuprofen (ADVIL) 800 MG tablet, Take 1 tablet (800 mg total) by mouth every 8 (eight) hours as needed (jaw pain)., Disp: 30 tablet, Rfl: 1 .  Insulin Pen Needle 32G X 4 MM MISC, Use once daily with saxenda, Disp: 100 each, Rfl: 0 .  levocetirizine (XYZAL) 5 MG tablet, Take 1 tablet (5 mg total) by mouth every evening., Disp: 30 tablet, Rfl: 2 .  Liraglutide -Weight Management (SAXENDA) 18 MG/3ML SOPN, Inject 3 mg into the skin daily., Disp: 15 mL, Rfl: 0 .  propranolol (INDERAL) 10 MG tablet, Take 1 tablet (10 mg total) by mouth 4 (four) times daily as needed (palpitations/fast heart rate)., Disp: 60 tablet, Rfl: 11 .  Vitamin D, Ergocalciferol, (DRISDOL) 1.25 MG (50000 UNIT) CAPS capsule, Take 1 capsule (50,000 Units total) by mouth every 7 (seven) days., Disp: 4 capsule, Rfl: 0   Objective:   Vitals:   01/04/21 1108  BP: (!) 143/79  Pulse: 76  SpO2: 100%    Physical Exam Vitals and nursing note reviewed.  Constitutional:      Appearance: Normal appearance.  HENT:     Head: Normocephalic and atraumatic.  Cardiovascular:     Rate and Rhythm: Normal rate.     Pulses: Normal pulses.  Pulmonary:     Effort: Pulmonary effort is normal.  Abdominal:     General: Abdomen is flat. There is no distension.     Tenderness: There is no abdominal tenderness.  Skin:    General: Skin is warm.     Capillary Refill: Capillary refill takes less than 2 seconds.     Coloration: Skin is not jaundiced.     Findings: No bruising.  Neurological:     General: No focal deficit present.     Mental Status: She is alert and oriented to person, place, and time.  Psychiatric:        Mood and Affect: Mood normal.        Behavior: Behavior normal.        Thought Content: Thought content normal.     Assessment & Plan:  Chronic bilateral thoracic back pain  Neck pain  Symptomatic mammary hypertrophy  Plan for physical therapy evaluation  and treatment.  We will email her the breast reduction brochure.  We will look at healthy weight and wellness referral.    The patient is an excellent candidate for bilateral breast reduction.  She will probably need an amputation technique.  She knows to call us when she has finished with physical therapy.  [place], DO

## 2021-01-21 ENCOUNTER — Other Ambulatory Visit: Payer: Self-pay

## 2021-01-21 ENCOUNTER — Encounter (INDEPENDENT_AMBULATORY_CARE_PROVIDER_SITE_OTHER): Payer: Self-pay | Admitting: Physician Assistant

## 2021-01-21 ENCOUNTER — Ambulatory Visit (INDEPENDENT_AMBULATORY_CARE_PROVIDER_SITE_OTHER): Payer: BC Managed Care – PPO | Admitting: Physician Assistant

## 2021-01-21 VITALS — BP 131/82 | HR 68 | Temp 97.4°F | Ht 63.0 in | Wt 238.0 lb

## 2021-01-21 DIAGNOSIS — Z9189 Other specified personal risk factors, not elsewhere classified: Secondary | ICD-10-CM

## 2021-01-21 DIAGNOSIS — R7303 Prediabetes: Secondary | ICD-10-CM | POA: Diagnosis not present

## 2021-01-21 DIAGNOSIS — Z6841 Body Mass Index (BMI) 40.0 and over, adult: Secondary | ICD-10-CM

## 2021-01-21 DIAGNOSIS — E7849 Other hyperlipidemia: Secondary | ICD-10-CM | POA: Diagnosis not present

## 2021-01-21 DIAGNOSIS — E66813 Obesity, class 3: Secondary | ICD-10-CM

## 2021-01-21 MED ORDER — SAXENDA 18 MG/3ML ~~LOC~~ SOPN: 3.0000 mg | PEN_INJECTOR | Freq: Every day | SUBCUTANEOUS | 0 refills | Status: DC

## 2021-01-21 NOTE — Progress Notes (Signed)
Chief Complaint:   OBESITY Vanessa Copeland is here to discuss her progress with her obesity treatment plan along with follow-up of her obesity related diagnoses. Vanessa Copeland is on the Category 3 Plan and states she is following her eating plan approximately 75% of the time. Vanessa Copeland states she is doing cardio and walks for 30 minutes 5 times per week.  Today's visit was #: 24 Starting weight: 256 lbs Starting date: 09/06/2019 Today's weight: 238 lbs Today's date: 01/21/2021 Total lbs lost to date: 18 Total lbs lost since last in-office visit: 0  Interim History: Vanessa Copeland has been exercising more often and she feels very good doing that. She is now on Saxenda 3 mg without side effects. Her appetite is controlled.   Subjective:   1. Pre-diabetes Vanessa Copeland's last A1c was 5.6. She denies polyphagia. She is exercising regularly.  2. Other hyperlipidemia Vanessa Copeland's last LDL was not at goal. She is not on medications and she denies chest pain. She is exercising regularly.  3. At risk for diabetes mellitus Vanessa Copeland is at higher than average risk for developing diabetes due to obesity.   Assessment/Plan:   1. Pre-diabetes Vanessa Copeland will continue her meal plan, and will continue to work on weight loss, exercise, and decreasing simple carbohydrates to help decrease the risk of diabetes.   2. Other hyperlipidemia Cardiovascular risk and specific lipid/LDL goals reviewed. We discussed several lifestyle modifications today. Vanessa Copeland will continue her meal plan, and will continue to work on exercise and weight loss efforts. Orders and follow up as documented in patient record.   Counseling Intensive lifestyle modifications are the first line treatment for this issue. . Dietary changes: Increase soluble fiber. Decrease simple carbohydrates. . Exercise changes: Moderate to vigorous-intensity aerobic activity 150 minutes per week if tolerated. . Lipid-lowering medications: see documented in medical record.  3. At risk  for diabetes mellitus Vanessa Copeland was given approximately 15 minutes of diabetes education and counseling today. We discussed intensive lifestyle modifications today with an emphasis on weight loss as well as increasing exercise and decreasing simple carbohydrates in her diet. We also reviewed medication options with an emphasis on risk versus benefit of those discussed.   Repetitive spaced learning was employed today to elicit superior memory formation and behavioral change.  4. Class 3 severe obesity with serious comorbidity and body mass index (BMI) of 40.0 to 44.9 in adult, unspecified obesity type (HCC) Vanessa Copeland is currently in the action stage of change. As such, her goal is to continue with weight loss efforts. She has agreed to the Category 3 Plan.   We discussed various medication options to help Vanessa Copeland with her weight loss efforts and we both agreed to continue Saxenda, and we will refill for 1 month.  - Liraglutide -Weight Management (SAXENDA) 18 MG/3ML SOPN; Inject 3 mg into the skin daily.  Dispense: 15 mL; Refill: 0  Exercise goals: As is.  Behavioral modification strategies: meal planning and cooking strategies and keeping healthy foods in the home.  Vanessa Copeland has agreed to follow-up with our clinic in 3 weeks. She was informed of the importance of frequent follow-up visits to maximize her success with intensive lifestyle modifications for her multiple health conditions.   Objective:   Blood pressure 131/82, pulse 68, temperature (!) 97.4 F (36.3 C), height 5\' 3"  (1.6 m), weight 238 lb (108 kg), SpO2 99 %. Body mass index is 42.16 kg/m.  General: Cooperative, alert, well developed, in no acute distress. HEENT: Conjunctivae and lids unremarkable. Cardiovascular: Regular rhythm.  Lungs: Normal work of breathing. Neurologic: No focal deficits.   Lab Results  Component Value Date   CREATININE 0.81 11/27/2020   BUN 14 11/27/2020   NA 143 11/27/2020   K 4.5 11/27/2020   CL 104  11/27/2020   CO2 25 11/27/2020   Lab Results  Component Value Date   ALT 21 11/27/2020   AST 17 11/27/2020   ALKPHOS 89 11/27/2020   BILITOT 0.5 11/27/2020   Lab Results  Component Value Date   HGBA1C 5.6 11/27/2020   HGBA1C 5.8 (H) 07/24/2020   HGBA1C 5.4 03/28/2020   HGBA1C 5.1 01/02/2020   HGBA1C 5.6 09/06/2019   Lab Results  Component Value Date   INSULIN 3.8 11/27/2020   INSULIN 8.4 07/24/2020   INSULIN 6.6 03/28/2020   INSULIN 14.6 09/06/2019   Lab Results  Component Value Date   TSH 2.000 03/28/2020   Lab Results  Component Value Date   CHOL 178 11/27/2020   HDL 44 11/27/2020   LDLCALC 121 (H) 11/27/2020   TRIG 69 11/27/2020   CHOLHDL 4.0 11/27/2020   Lab Results  Component Value Date   WBC 7.9 03/29/2020   HGB 11.4 03/29/2020   HCT 34.6 03/29/2020   MCV 88 03/29/2020   PLT 169 03/29/2020   Lab Results  Component Value Date   IRON 57 12/27/2019   TIBC 321 12/27/2019   FERRITIN 51 12/27/2019   Attestation Statements:   Reviewed by clinician on day of visit: allergies, medications, problem list, medical history, surgical history, family history, social history, and previous encounter notes.   Vanessa Copeland, am acting as transcriptionist for [employer], PA-C.  I have reviewed the above documentation for accuracy and completeness, and I agree with the above. Abby Potash, PA-C

## 2021-01-22 ENCOUNTER — Telehealth (INDEPENDENT_AMBULATORY_CARE_PROVIDER_SITE_OTHER): Payer: Self-pay

## 2021-01-22 NOTE — Telephone Encounter (Signed)
PA for Kirke Shaggy has been approved 12/24/20-04/23/21

## 2021-02-11 ENCOUNTER — Encounter (INDEPENDENT_AMBULATORY_CARE_PROVIDER_SITE_OTHER): Payer: Self-pay | Admitting: Physician Assistant

## 2021-02-11 ENCOUNTER — Other Ambulatory Visit: Payer: Self-pay

## 2021-02-11 ENCOUNTER — Ambulatory Visit (INDEPENDENT_AMBULATORY_CARE_PROVIDER_SITE_OTHER): Payer: BC Managed Care – PPO | Admitting: Physician Assistant

## 2021-02-11 VITALS — BP 124/76 | HR 74 | Temp 97.5°F | Ht 63.0 in | Wt 238.0 lb

## 2021-02-11 DIAGNOSIS — E559 Vitamin D deficiency, unspecified: Secondary | ICD-10-CM

## 2021-02-11 DIAGNOSIS — R7303 Prediabetes: Secondary | ICD-10-CM | POA: Diagnosis not present

## 2021-02-11 DIAGNOSIS — Z6841 Body Mass Index (BMI) 40.0 and over, adult: Secondary | ICD-10-CM

## 2021-02-11 DIAGNOSIS — Z9189 Other specified personal risk factors, not elsewhere classified: Secondary | ICD-10-CM

## 2021-02-11 MED ORDER — SAXENDA 18 MG/3ML ~~LOC~~ SOPN: 3.0000 mg | PEN_INJECTOR | Freq: Every day | SUBCUTANEOUS | 0 refills | Status: DC

## 2021-02-11 MED ORDER — VITAMIN D (ERGOCALCIFEROL) 1.25 MG (50000 UNIT) PO CAPS: 50000.0000 [IU] | ORAL_CAPSULE | ORAL | 0 refills | Status: DC

## 2021-02-11 MED ORDER — INSULIN PEN NEEDLE 32G X 4 MM MISC: 0 refills | Status: DC

## 2021-02-12 NOTE — Progress Notes (Signed)
Chief Complaint:   OBESITY Vanessa Copeland is here to discuss her progress with her obesity treatment plan along with follow-up of her obesity related diagnoses. Vanessa Copeland is on the Category 3 Plan and states she is following her eating plan approximately 80% of the time. Vanessa Copeland states she is walking for 60 minutes 5 times per week.  Today's visit was #: 25 Starting weight: 256 lbs Starting date: 09/06/2019 Today's weight: 238 lbs Today's date: 02/11/2021 Total lbs lost to date: 18 Total lbs lost since last in-office visit: 0  Interim History: Vanessa Copeland is eating a tuna pack with crackers for lunch, and she is not weighing her protein at dinner or counting snack calories. She is using ranch on her salads. She is not drinking enough water.  Subjective:   1. Pre-diabetes Vanessa Copeland denies polyphagia. Last A1c was 5.6, and her hunger is controlled.  2. Vitamin D deficiency Vanessa Copeland is on Vit D, and she denies nausea, vomiting, or muscle weakness.  3. At risk for hypoglycemia Vanessa Copeland is at increased risk for hypoglycemia due to changes in diet, diagnosis of diabetes, and/or insulin use.   Assessment/Plan:   1. Pre-diabetes Vanessa Copeland will continue to work on weight loss, exercise, and decreasing simple carbohydrates to help decrease the risk of diabetes.   2. Vitamin D deficiency Low Vitamin D level contributes to fatigue and are associated with obesity, breast, and colon cancer. We will refill prescription Vitamin D for 1 month. Vanessa Copeland will follow-up for routine testing of Vitamin D, at least 2-3 times per year to avoid over-replacement.  - Vitamin D, Ergocalciferol, (DRISDOL) 1.25 MG (50000 UNIT) CAPS capsule; Take 1 capsule (50,000 Units total) by mouth every 7 (seven) days.  Dispense: 4 capsule; Refill: 0  3. At risk for hypoglycemia Vanessa Copeland was given approximately 15 minutes of counseling today regarding prevention of hypoglycemia. She was advised of symptoms of hypoglycemia. Vanessa Copeland was instructed  to avoid skipping meals, eat regular protein rich meals and schedule low calorie snacks as needed.   Repetitive spaced learning was employed today to elicit superior memory formation and behavioral change  4. Class 3 severe obesity with serious comorbidity and body mass index (BMI) of 40.0 to 44.9 in adult, unspecified obesity type (HCC) Vanessa Copeland is currently in the action stage of change. As such, her goal is to continue with weight loss efforts. She has agreed to the Category 3 Plan.   We discussed various medication options to help Vanessa Copeland with her weight loss efforts and we both agreed to continue Saxenda, and we will refill for 1 month and we will refill pen needles #100 with no refills.  - Liraglutide -Weight Management (SAXENDA) 18 MG/3ML SOPN; Inject 3 mg into the skin daily.  Dispense: 15 mL; Refill: 0 - Insulin Pen Needle 32G X 4 MM MISC; Use once daily with saxenda  Dispense: 100 each; Refill: 0  We will recheck IC at her next visit.  Exercise goals: As is.  Behavioral modification strategies: increasing lean protein intake, increasing water intake and meal planning and cooking strategies.  Vanessa Copeland has agreed to follow-up with our clinic in 3 weeks. She was informed of the importance of frequent follow-up visits to maximize her success with intensive lifestyle modifications for her multiple health conditions.   Objective:   Blood pressure 124/76, pulse 74, temperature (!) 97.5 F (36.4 C), height 5\' 3"  (1.6 m), weight 238 lb (108 kg), SpO2 99 %. Body mass index is 42.16 kg/m.  General: Cooperative, alert, well  developed, in no acute distress. HEENT: Conjunctivae and lids unremarkable. Cardiovascular: Regular rhythm.  Lungs: Normal work of breathing. Neurologic: No focal deficits.   Lab Results  Component Value Date   CREATININE 0.81 11/27/2020   BUN 14 11/27/2020   NA 143 11/27/2020   K 4.5 11/27/2020   CL 104 11/27/2020   CO2 25 11/27/2020   Lab Results  Component  Value Date   ALT 21 11/27/2020   AST 17 11/27/2020   ALKPHOS 89 11/27/2020   BILITOT 0.5 11/27/2020   Lab Results  Component Value Date   HGBA1C 5.6 11/27/2020   HGBA1C 5.8 (H) 07/24/2020   HGBA1C 5.4 03/28/2020   HGBA1C 5.1 01/02/2020   HGBA1C 5.6 09/06/2019   Lab Results  Component Value Date   INSULIN 3.8 11/27/2020   INSULIN 8.4 07/24/2020   INSULIN 6.6 03/28/2020   INSULIN 14.6 09/06/2019   Lab Results  Component Value Date   TSH 2.000 03/28/2020   Lab Results  Component Value Date   CHOL 178 11/27/2020   HDL 44 11/27/2020   LDLCALC 121 (H) 11/27/2020   TRIG 69 11/27/2020   CHOLHDL 4.0 11/27/2020   Lab Results  Component Value Date   WBC 7.9 03/29/2020   HGB 11.4 03/29/2020   HCT 34.6 03/29/2020   MCV 88 03/29/2020   PLT 169 03/29/2020   Lab Results  Component Value Date   IRON 57 12/27/2019   TIBC 321 12/27/2019   FERRITIN 51 12/27/2019   Attestation Statements:   Reviewed by clinician on day of visit: allergies, medications, problem list, medical history, surgical history, family history, social history, and previous encounter notes.   Wilhemena Durie, am acting as transcriptionist for [employer], PA-C.  I have reviewed the above documentation for accuracy and completeness, and I agree with the above. Abby Potash, PA-C

## 2021-02-14 ENCOUNTER — Other Ambulatory Visit: Payer: Self-pay

## 2021-02-14 ENCOUNTER — Ambulatory Visit: Payer: BC Managed Care – PPO | Admitting: Family Medicine

## 2021-02-14 ENCOUNTER — Encounter: Payer: Self-pay | Admitting: Family Medicine

## 2021-02-14 VITALS — BP 130/84 | HR 60 | Temp 98.4°F | Ht 63.0 in | Wt 243.4 lb

## 2021-02-14 DIAGNOSIS — J302 Other seasonal allergic rhinitis: Secondary | ICD-10-CM

## 2021-02-14 DIAGNOSIS — H6981 Other specified disorders of Eustachian tube, right ear: Secondary | ICD-10-CM

## 2021-02-14 DIAGNOSIS — H9201 Otalgia, right ear: Secondary | ICD-10-CM

## 2021-02-14 MED ORDER — FLUTICASONE PROPIONATE 50 MCG/ACT NA SUSP: 2.0000 | Freq: Every day | NASAL | 6 refills | Status: DC

## 2021-02-14 NOTE — Progress Notes (Signed)
Chief Complaint  Patient presents with  . Ear Pain    Right ear pain that started a few days, comes and goes. Today not too bad.    Pain started in the R ear 2 days ago. Allergies have been flaring recently. She is taking Xyzal and Flonase (thinks it is old), only started these 2 days ago. She has an itchy throat, itchy ears. Occasional sneeze.  No itchy eyes or runny nose. Denies sore throat or significant PND or cough.  The ear pain comes and goes. She has been told she grinds her teeth (by dentist) and has h/o TMJ problems.  It hasn't been popping as badly recently. Her current ear pain feels different than her TMJ.  PMH, PSH, SH reviewed  Outpatient Encounter Medications as of 02/14/2021  Medication Sig  . escitalopram (LEXAPRO) 20 MG tablet Take 1 tablet (20 mg total) by mouth every morning.  . Insulin Pen Needle 32G X 4 MM MISC Use once daily with saxenda  . levocetirizine (XYZAL) 5 MG tablet Take 1 tablet (5 mg total) by mouth every evening.  Marland Kitchen levonorgestrel (MIRENA) 20 MCG/24HR IUD 1 each by Intrauterine route once.  . Liraglutide -Weight Management (SAXENDA) 18 MG/3ML SOPN Inject 3 mg into the skin daily.  . Vitamin D, Ergocalciferol, (DRISDOL) 1.25 MG (50000 UNIT) CAPS capsule Take 1 capsule (50,000 Units total) by mouth every 7 (seven) days.  Marland Kitchen albuterol (VENTOLIN HFA) 108 (90 Base) MCG/ACT inhaler TAKE 2 PUFFS BY MOUTH EVERY 6 HOURS AS NEEDED FOR WHEEZE OR SHORTNESS OF BREATH (Patient not taking: Reported on 02/14/2021)  . EPINEPHrine 0.3 mg/0.3 mL IJ SOAJ injection Inject 0.3 mLs (0.3 mg total) into the muscle as needed for anaphylaxis. (Patient not taking: Reported on 02/14/2021)  . ibuprofen (ADVIL) 800 MG tablet Take 1 tablet (800 mg total) by mouth every 8 (eight) hours as needed (jaw pain). (Patient not taking: Reported on 02/14/2021)  . propranolol (INDERAL) 10 MG tablet Take 1 tablet (10 mg total) by mouth 4 (four) times daily as needed (palpitations/fast heart rate).  (Patient not taking: Reported on 02/14/2021)   No facility-administered encounter medications on file as of 02/14/2021.   Allergies  Allergen Reactions  . Shellfish Allergy Shortness Of Breath   ROS: no fever, chills, n/v/d, cough, shortness of breath, chest pain.  Ear pain and allergy symptoms per HPI.   PHYSICAL EXAM:  BP 130/84   Pulse 60   Temp 98.4 F (36.9 C) (Tympanic)   Ht 5\' 3"  (1.6 m)   Wt 243 lb 6.4 oz (110.4 kg)   BMI 43.12 kg/m   Well-appearing, pleasant female in no distress HEENT: conjunctiva and sclera are clear, EOMI. Nasal mucosa with mod edema L>R, pale. Sinuses nontender. OP is clear, no erythema or lesions TM's and EAC's normal bilaterally. TMJ nontender. nontender at temporalis and masseter muscles Neck: no lymphadenopathy, thyromegaly or mass Psych: normal mood, affect, hygiene and grooming  ASSESSMENT/PLAN:  Acute otalgia, right - normal exam; likely secondary to ETD and allergies. no e/o infection or TMJ  Dysfunction of right eustachian tube - continue allergy treatment. may use sudafed and tylenol prn for ear pain.  Risks/SE reviewed  Seasonal allergic rhinitis, unspecified trigger - cont flonase and xyzal - Plan: fluticasone (FLONASE) 50 MCG/ACT nasal spray

## 2021-02-14 NOTE — Patient Instructions (Signed)
Continue the Xyzal and Flonase daily. You may use sudafed if needed for sharp/severe ear pain (unless causing palpitations or very high blood pressure, then stop the sudafed). You may use tylenol as needed for pain.  If you develop fever, worsening pain, loss of hearing or other change in symptoms, please return for re-evaluation.

## 2021-02-26 ENCOUNTER — Ambulatory Visit: Payer: BC Managed Care – PPO

## 2021-03-04 ENCOUNTER — Encounter (INDEPENDENT_AMBULATORY_CARE_PROVIDER_SITE_OTHER): Payer: Self-pay | Admitting: Physician Assistant

## 2021-03-04 ENCOUNTER — Ambulatory Visit (INDEPENDENT_AMBULATORY_CARE_PROVIDER_SITE_OTHER): Payer: BC Managed Care – PPO | Admitting: Physician Assistant

## 2021-03-04 ENCOUNTER — Other Ambulatory Visit: Payer: Self-pay

## 2021-03-04 VITALS — BP 147/82 | HR 61 | Temp 97.7°F | Ht 63.0 in | Wt 238.0 lb

## 2021-03-04 DIAGNOSIS — E559 Vitamin D deficiency, unspecified: Secondary | ICD-10-CM | POA: Diagnosis not present

## 2021-03-04 DIAGNOSIS — R0602 Shortness of breath: Secondary | ICD-10-CM | POA: Diagnosis not present

## 2021-03-04 DIAGNOSIS — Z9189 Other specified personal risk factors, not elsewhere classified: Secondary | ICD-10-CM

## 2021-03-04 DIAGNOSIS — Z6841 Body Mass Index (BMI) 40.0 and over, adult: Secondary | ICD-10-CM

## 2021-03-04 MED ORDER — VITAMIN D (ERGOCALCIFEROL) 1.25 MG (50000 UNIT) PO CAPS: 50000.0000 [IU] | ORAL_CAPSULE | ORAL | 0 refills | Status: DC

## 2021-03-04 MED ORDER — SAXENDA 18 MG/3ML ~~LOC~~ SOPN
3.0000 mg | PEN_INJECTOR | Freq: Every day | SUBCUTANEOUS | 0 refills | Status: DC
Start: 2021-03-04 — End: 2021-03-28

## 2021-03-06 NOTE — Progress Notes (Signed)
Chief Complaint:   OBESITY Vanessa Copeland is here to discuss her progress with her obesity treatment plan along with follow-up of her obesity related diagnoses. Vanessa Copeland is on the Category 3 Plan and states she is following her eating plan approximately 75% of the time. Vanessa Copeland states she is walking and doing exercise classes for 30 minutes 5 times per week.  Today's visit was #: 37 Starting weight: 256 lbs Starting date: 09/06/2019 Today's weight: 238 lbs Today's date: 03/04/2021 Total lbs lost to date: 18 Total lbs lost since last in-office visit: 0  Interim History: Vanessa Copeland reports that she has been snacking more often due to increased hunger. She is not counting her snack calories.  Subjective:   1. Vitamin D deficiency Vanessa Copeland is on Vit D weekly, and she denies nausea, vomiting, or muscle weakness.  2. Shortness of breath on exertion Vanessa Copeland notes shortness of breath with exertion. She denies dizziness or lightheadedness.  3. At risk for osteoporosis Vanessa Copeland is at higher risk of osteopenia and osteoporosis due to Vitamin D deficiency.   Assessment/Plan:   1. Vitamin D deficiency Low Vitamin D level contributes to fatigue and are associated with obesity, breast, and colon cancer. We will refill prescription Vitamin D for 1 month. Vanessa Copeland will follow-up for routine testing of Vitamin D, at least 2-3 times per year to avoid over-replacement.  - Vitamin D, Ergocalciferol, (DRISDOL) 1.25 MG (50000 UNIT) CAPS capsule; Take 1 capsule (50,000 Units total) by mouth every 7 (seven) days.  Dispense: 4 capsule; Refill: 0  2. Shortness of breath on exertion IC was repeated today. Vanessa Copeland has agreed to work on weight loss and gradually increase exercise to treat her exercise induced shortness of breath. Will continue to monitor closely.  3. At risk for osteoporosis Vanessa Copeland was given approximately 15 minutes of osteoporosis prevention counseling today. Vanessa Copeland is at risk for osteopenia and osteoporosis  due to her Vitamin D deficiency. She was encouraged to take her Vitamin D and follow her higher calcium diet and increase strengthening exercise to help strengthen her bones and decrease her risk of osteopenia and osteoporosis.  Repetitive spaced learning was employed today to elicit superior memory formation and behavioral change.  4. Class 3 severe obesity with serious comorbidity and body mass index (BMI) of 40.0 to 44.9 in adult, unspecified obesity type (HCC) Vanessa Copeland is currently in the action stage of change. As such, her goal is to continue with weight loss efforts. She has agreed to change to the Category 4 Plan.   We discussed various medication options to help Vanessa Copeland with her weight loss efforts and we both agreed to continue Saxenda, and we will refill for 1 month.  - Liraglutide -Weight Management (SAXENDA) 18 MG/3ML SOPN; Inject 3 mg into the skin daily.  Dispense: 15 mL; Refill: 0   Exercise goals: As is.  Behavioral modification strategies: meal planning and cooking strategies and keeping healthy foods in the home.  [name] has agreed to follow-up with our clinic in 2 weeks. She was informed of the importance of frequent follow-up visits to maximize her success with intensive lifestyle modifications for her multiple health conditions.   Objective:   Blood pressure (!) 147/82, pulse 61, temperature 97.7 F (36.5 C), height 5\' 3"  (1.6 m), weight 238 lb (108 kg), SpO2 100 %. Body mass index is 42.16 kg/m.  General: Cooperative, alert, well developed, in no acute distress. HEENT: Conjunctivae and lids unremarkable. Cardiovascular: Regular rhythm.  Lungs: Normal work of breathing. Neurologic:  No focal deficits.   Lab Results  Component Value Date   CREATININE 0.81 11/27/2020   BUN 14 11/27/2020   NA 143 11/27/2020   K 4.5 11/27/2020   CL 104 11/27/2020   CO2 25 11/27/2020   Lab Results  Component Value Date   ALT 21 11/27/2020   AST 17 11/27/2020   ALKPHOS 89  11/27/2020   BILITOT 0.5 11/27/2020   Lab Results  Component Value Date   HGBA1C 5.6 11/27/2020   HGBA1C 5.8 (H) 07/24/2020   HGBA1C 5.4 03/28/2020   HGBA1C 5.1 01/02/2020   HGBA1C 5.6 09/06/2019   Lab Results  Component Value Date   INSULIN 3.8 11/27/2020   INSULIN 8.4 07/24/2020   INSULIN 6.6 03/28/2020   INSULIN 14.6 09/06/2019   Lab Results  Component Value Date   TSH 2.000 03/28/2020   Lab Results  Component Value Date   CHOL 178 11/27/2020   HDL 44 11/27/2020   LDLCALC 121 (H) 11/27/2020   TRIG 69 11/27/2020   CHOLHDL 4.0 11/27/2020   Lab Results  Component Value Date   WBC 7.9 03/29/2020   HGB 11.4 03/29/2020   HCT 34.6 03/29/2020   MCV 88 03/29/2020   PLT 169 03/29/2020   Lab Results  Component Value Date   IRON 57 12/27/2019   TIBC 321 12/27/2019   FERRITIN 51 12/27/2019   Attestation Statements:   Reviewed by clinician on day of visit: allergies, medications, problem list, medical history, surgical history, family history, social history, and previous encounter notes.   Wilhemena Durie, am acting as transcriptionist for [employer], PA-C.  I have reviewed the above documentation for accuracy and completeness, and I agree with the above. Abby Potash, PA-C

## 2021-03-20 ENCOUNTER — Ambulatory Visit (INDEPENDENT_AMBULATORY_CARE_PROVIDER_SITE_OTHER): Payer: BC Managed Care – PPO | Admitting: Physician Assistant

## 2021-03-28 ENCOUNTER — Other Ambulatory Visit: Payer: Self-pay

## 2021-03-28 ENCOUNTER — Other Ambulatory Visit (INDEPENDENT_AMBULATORY_CARE_PROVIDER_SITE_OTHER): Payer: Self-pay | Admitting: Physician Assistant

## 2021-03-28 ENCOUNTER — Encounter (INDEPENDENT_AMBULATORY_CARE_PROVIDER_SITE_OTHER): Payer: Self-pay | Admitting: Family Medicine

## 2021-03-28 ENCOUNTER — Ambulatory Visit (INDEPENDENT_AMBULATORY_CARE_PROVIDER_SITE_OTHER): Payer: BC Managed Care – PPO | Admitting: Family Medicine

## 2021-03-28 VITALS — BP 114/76 | HR 65 | Temp 97.6°F | Ht 63.0 in | Wt 240.0 lb

## 2021-03-28 DIAGNOSIS — F3289 Other specified depressive episodes: Secondary | ICD-10-CM

## 2021-03-28 DIAGNOSIS — E559 Vitamin D deficiency, unspecified: Secondary | ICD-10-CM | POA: Diagnosis not present

## 2021-03-28 DIAGNOSIS — Z9189 Other specified personal risk factors, not elsewhere classified: Secondary | ICD-10-CM

## 2021-03-28 DIAGNOSIS — Z6841 Body Mass Index (BMI) 40.0 and over, adult: Secondary | ICD-10-CM

## 2021-03-28 MED ORDER — VITAMIN D (ERGOCALCIFEROL) 1.25 MG (50000 UNIT) PO CAPS: 50000.0000 [IU] | ORAL_CAPSULE | ORAL | 0 refills | Status: DC

## 2021-03-28 MED ORDER — SAXENDA 18 MG/3ML ~~LOC~~ SOPN: 3.0000 mg | PEN_INJECTOR | Freq: Every day | SUBCUTANEOUS | 0 refills | Status: DC

## 2021-03-28 NOTE — Telephone Encounter (Signed)
Pt last seen by Tracey Aguilar, PA-C.  

## 2021-04-02 NOTE — Progress Notes (Signed)
Chief Complaint:   OBESITY Vanessa Copeland is here to discuss her progress with her obesity treatment plan along with follow-up of her obesity related diagnoses.   Today's visit was #: 49 Starting weight: 256 lbs Starting date: 09/06/2019 Today's weight: 240 lbs Today's date: 03/28/2021 Total lbs lost to date: 16 lbs Body mass index is 42.51 kg/m.  Total weight loss percentage to date: -6.25%  Interim History:  Vanessa Copeland was seen before by Vanessa Potash, PA-C.  She has gained 2 pounds since her last office visit.  Not getting in proteins at night.  Last night had fried [employer] sandwich only.  Plan:  "Bear down" with meal plan and really try to prep/follow.  Increase cardio to 1 hour plus 30 minutes resistance training 5 days per week.  Will refill Saxenda at current dose.  Current Meal Plan: the Category 4 Plan for 75% of the time.  Current Exercise Plan: Walking and working out for 60 minutes 5 times per week. Current Anti-Obesity Medications: Saxenda 3 mg subcutaneously daily. Side effects: None.  Assessment/Plan:   1. Vitamin D deficiency Not at goal. Current vitamin D is 36.8, tested on 11/27/2020. Optimal goal > 50 ng/dL.  She is taking vitamin D 50,000 IU weekly.   Plan: Continue to take prescription Vitamin D @50 ,000 IU every week as prescribed.  Follow-up for routine testing of Vitamin D, at least 2-3 times per year to avoid over-replacement.  - Refill Vitamin D, Ergocalciferol, (DRISDOL) 1.25 MG (50000 UNIT) CAPS capsule; Take 1 capsule (50,000 Units total) by mouth every 7 (seven) days.  Dispense: 4 capsule; Refill: 0  2. At risk for deficient intake of food Vanessa Copeland was given extensive education and counseling today of more than 9 minutes on risks associated with deficient food intake.  Counseled her on the importance of following our prescribed meal plan and eating adequate amounts of protein.  Discussed with Vanessa Copeland that inadequate food intake over longer periods of  time can slow their metabolism down significantly.   3. Obesity with current BMI of 42.5  - Refill Liraglutide -Weight Management (SAXENDA) 18 MG/3ML SOPN; Inject 3 mg into the skin daily.  Dispense: 15 mL; Refill: 0  Course: Vanessa Copeland is currently in the action stage of change. As such, her goal is to continue with weight loss efforts.   Nutrition goals: She has agreed to the Category 4 Plan.   Exercise goals: Increase as tolerated.  Behavioral modification strategies: increasing lean protein intake, decreasing simple carbohydrates, no skipping meals, meal planning and cooking strategies and planning for success.  Vanessa Copeland has agreed to follow-up with our clinic in 3 weeks with Vanessa Potash, PA-C. She was informed of the importance of frequent follow-up visits to maximize her success with intensive lifestyle modifications for her multiple health conditions.   Objective:   Blood pressure 114/76, pulse 65, temperature 97.6 F (36.4 C), height 5\' 3"  (1.6 m), weight 240 lb (108.9 kg), SpO2 98 %. Body mass index is 42.51 kg/m.  General: Cooperative, alert, well developed, in no acute distress. HEENT: Conjunctivae and lids unremarkable. Cardiovascular: Regular rhythm.  Lungs: Normal work of breathing. Neurologic: No focal deficits.   Lab Results  Component Value Date   CREATININE 0.81 11/27/2020   BUN 14 11/27/2020   NA 143 11/27/2020   K 4.5 11/27/2020   CL 104 11/27/2020   CO2 25 11/27/2020   Lab Results  Component Value Date   ALT 21 11/27/2020   AST 17 11/27/2020  ALKPHOS 89 11/27/2020   BILITOT 0.5 11/27/2020   Lab Results  Component Value Date   HGBA1C 5.6 11/27/2020   HGBA1C 5.8 (H) 07/24/2020   HGBA1C 5.4 03/28/2020   HGBA1C 5.1 01/02/2020   HGBA1C 5.6 09/06/2019   Lab Results  Component Value Date   INSULIN 3.8 11/27/2020   INSULIN 8.4 07/24/2020   INSULIN 6.6 03/28/2020   INSULIN 14.6 09/06/2019   Lab Results  Component Value Date   TSH 2.000  03/28/2020   Lab Results  Component Value Date   CHOL 178 11/27/2020   HDL 44 11/27/2020   LDLCALC 121 (H) 11/27/2020   TRIG 69 11/27/2020   CHOLHDL 4.0 11/27/2020   Lab Results  Component Value Date   WBC 7.9 03/29/2020   HGB 11.4 03/29/2020   HCT 34.6 03/29/2020   MCV 88 03/29/2020   PLT 169 03/29/2020   Lab Results  Component Value Date   IRON 57 12/27/2019   TIBC 321 12/27/2019   FERRITIN 51 12/27/2019   Attestation Statements:   Reviewed by clinician on day of visit: allergies, medications, problem list, medical history, surgical history, family history, social history, and previous encounter notes.  I, Water quality scientist, CMA, am acting as Location manager for [employer], DO.  I have reviewed the above documentation for accuracy and completeness, and I agree with the above. Marjory Sneddon, D.O.  [place] was signed into law in 2016 which includes the topic of electronic health records.  This provides immediate access to information in MyChart.  This includes consultation notes, operative notes, office notes, lab results and pathology reports.  If you have any questions about what you read please let us know at your next visit so we can discuss your concerns and take corrective action if need be.  We are right here with you.  No orders of the defined types were placed in this encounter.   Medications Discontinued During This Encounter  Medication Reason  . Vitamin D, Ergocalciferol, (DRISDOL) 1.25 MG (50000 UNIT) CAPS capsule Reorder  . Liraglutide -Weight Management (SAXENDA) 18 MG/3ML SOPN Reorder     Meds ordered this encounter  Medications  . Vitamin D, Ergocalciferol, (DRISDOL) 1.25 MG (50000 UNIT) CAPS capsule    Sig: Take 1 capsule (50,000 Units total) by mouth every 7 (seven) days.    Dispense:  4 capsule    Refill:  0  . Liraglutide -Weight Management (SAXENDA) 18 MG/3ML SOPN    Sig: Inject 3 mg into the skin daily.    Dispense:   15 mL    Refill:  0

## 2021-04-09 ENCOUNTER — Other Ambulatory Visit: Payer: Self-pay | Admitting: Family Medicine

## 2021-04-09 NOTE — Telephone Encounter (Signed)
Please ask if she had to use these or if they just expired. Ok to refill.

## 2021-04-09 NOTE — Telephone Encounter (Signed)
Is this okay to refill? 

## 2021-04-10 NOTE — Telephone Encounter (Signed)
Left message for pt to call me back 

## 2021-04-11 ENCOUNTER — Other Ambulatory Visit: Payer: Self-pay | Admitting: Family Medicine

## 2021-04-11 MED ORDER — EPINEPHRINE 0.3 MG/0.3ML IJ SOAJ: 0.3000 mg | INTRAMUSCULAR | 0 refills | Status: DC | PRN

## 2021-04-22 ENCOUNTER — Ambulatory Visit (INDEPENDENT_AMBULATORY_CARE_PROVIDER_SITE_OTHER): Payer: BC Managed Care – PPO | Admitting: Physician Assistant

## 2021-05-09 ENCOUNTER — Ambulatory Visit: Payer: BC Managed Care – PPO | Admitting: Medical

## 2021-05-09 ENCOUNTER — Encounter: Payer: Self-pay | Admitting: Medical

## 2021-05-09 ENCOUNTER — Other Ambulatory Visit: Payer: Self-pay

## 2021-05-09 VITALS — BP 128/82 | HR 79 | Ht 63.0 in | Wt 243.8 lb

## 2021-05-09 DIAGNOSIS — R1013 Epigastric pain: Secondary | ICD-10-CM | POA: Insufficient documentation

## 2021-05-09 DIAGNOSIS — R195 Other fecal abnormalities: Secondary | ICD-10-CM | POA: Insufficient documentation

## 2021-05-09 DIAGNOSIS — R110 Nausea: Secondary | ICD-10-CM | POA: Insufficient documentation

## 2021-05-09 MED ORDER — OMEPRAZOLE 40 MG PO CPDR: 40.0000 mg | DELAYED_RELEASE_CAPSULE | Freq: Every day | ORAL | 0 refills | Status: DC

## 2021-05-09 NOTE — Patient Instructions (Signed)
Your symptoms suggest epigastric problem such as gastritis, esophagitis, acid reflux or similar.  There is the possibility of an ulcer.  Recommendations:  Begin omeprazole 40 mg 1 tablet daily on empty stomach 30 to 45 minutes before breakfast  Use the omeprazole for at least 2 weeks maybe longer.  Consider also using Pepto-Bismol once or twice daily for the next 1 to 2 weeks  Avoid ibuprofen's when possible or any other anti-inflammatory as they can irritate the stomach lining  You can also use antacids such as tums as needed  For the time being and limit or cut back on acidic, spicy, fried, or pepper based foods  If your symptoms are not much improved within the next 2 to 3 weeks he can consider other treatment or possibly even referral back to gastroenterology  Sometimes a bacteria called H. pylori in the stomach can be causing ulcer or reflux issues which requires endoscopy to help diagnose

## 2021-05-09 NOTE — Progress Notes (Signed)
Subjective:  Vanessa Copeland is a 55 y.o. female who presents for Chief Complaint  Patient presents with  . Abdominal Pain    Stomach pain for a couple weeks      Here for epigastric abdominal pain and food tends to go "right thorugh her" after eating.  This has been intermittent the last few weeks.  sometimtes pain radiates to the back centrally.   Doesn't endorse pain in LUQ or RUQ.   Sometimes gets a little nausea.  No vomting.   Pains can be sometimes in the morning, not neceassiary right after eating.   Has BM every day.   Particular at lunch or dinner, has loose stool after eating.  No blood in stool.  Ibuprofen listed in medications, but she doesn't take this often.  Only takes if having jaw pain.     Not on any particlar medication for the symptoms.  She does not drink alcohol.  She is not particularly heavy with spicy or acidic foods  No other aggravating or relieving factors.    No other c/o.  Past Medical History:  Diagnosis Date  . Allergy   . Anemia   . Anxiety   . Asthma    mild intermittent(when allergies are flaring)  . Complication of anesthesia   . Food allergy    Shellfish  . H/O echocardiogram 12/2005   Trace MR, mild TR, nl L ventricular systolic function.  Marland Kitchen Heart murmur    since birth. Never has had any problems  . Hepatic steatosis    per CT  . History of kidney stones   . Kidney stones   . Morbid obesity ([place]) 04/29/2019  . Obesity, unspecified   . PONV (postoperative nausea and vomiting)   . Vitamin D deficiency 01/2010   Past Surgical History:  Procedure Laterality Date  . CESAREAN SECTION     x3  . COLONOSCOPY    . CYSTOSCOPY WITH RETROGRADE PYELOGRAM, URETEROSCOPY AND STENT PLACEMENT Left 07/16/2018   Procedure: CYSTOSCOPY WITH RETROGRADE PYELOGRAM, URETEROSCOPY AND STENT PLACEMENT;  Surgeon: Cleon Gustin, MD;  Location: [facility];  Service: Urology;  Laterality: Left;  . EXTRACORPOREAL SHOCK WAVE LITHOTRIPSY Left 06/21/2018   Procedure: LEFT  EXTRACORPOREAL SHOCK WAVE LITHOTRIPSY (ESWL);  Surgeon: Franchot Gallo, MD;  Location: [facility];  Service: Urology;  Laterality: Left;  . HOLMIUM LASER APPLICATION Left 9/81/1914   Procedure: HOLMIUM LASER APPLICATION;  Surgeon: Cleon Gustin, MD;  Location: [facility];  Service: Urology;  Laterality: Left;  . [place] SURGERY  2019  . OOPHORECTOMY  2002   left due to cyst  . POLYPECTOMY    . TUBAL LIGATION  1996     The following portions of the patient's history were reviewed and updated as appropriate: allergies, current medications, past family history, past medical history, past social history, past surgical history and problem list.  ROS Otherwise as in subjective above  Objective: BP 128/82   Pulse 79   Ht 5\' 3"  (1.6 m)   Wt 243 lb 12.8 oz (110.6 kg)   SpO2 94%   BMI 43.19 kg/m   General appearance: alert, no distress, well developed, well nourished Heart: RRR, normal S1, S2, no murmurs Lungs: CTA bilaterally, no wheezes, rhonchi, or rales Abdomen: +bs, soft, non tender, non distended, no masses, no hepatomegaly, no splenomegaly Pulses: 2+ radial pulses, 2+ pedal pulses, normal cap refill Ext: no edema  Colonospcy 03/2020 with Dr. [place] Cellar: Impression:  - Two diminutive polyps in the  ascending colon, removed with a cold snare. Resected and retrieved. - Diverticulosis in the descending colon. - Internal hemorrhoids. - The examination was otherwise normal.    Assessment: Encounter Diagnoses  Name Primary?  . Epigastric pain Yes  . Loose stools   . Nausea      Plan: We discussed symptoms and possible differential.  Most likely gastritis or esophagitis.  Possible ulcer.  Less likely gallbladder or other issue.  We discussed the following instructions which were also printed for patient  Patient Instructions  Your symptoms suggest epigastric problem such as gastritis, esophagitis, acid reflux or similar.  There is the possibility of an  ulcer.  Recommendations:  Begin omeprazole 40 mg 1 tablet daily on empty stomach 30 to 45 minutes before breakfast  Use the omeprazole for at least 2 weeks maybe longer.  Consider also using Pepto-Bismol once or twice daily for the next 1 to 2 weeks  Avoid ibuprofen's when possible or any other anti-inflammatory as they can irritate the stomach lining  You can also use antacids such as tums as needed  For the time being and limit or cut back on acidic, spicy, fried, or pepper based foods  If your symptoms are not much improved within the next 2 to 3 weeks he can consider other treatment or possibly even referral back to gastroenterology  Sometimes a bacteria called H. pylori in the stomach can be causing ulcer or reflux issues which requires endoscopy to help diagnose    [name] was seen today for abdominal pain.  Diagnoses and all orders for this visit:  Epigastric pain  Loose stools  Nausea  Other orders -     omeprazole (PRILOSEC) 40 MG capsule; Take 1 capsule (40 mg total) by mouth daily.    Follow up: 2 wk

## 2021-05-21 ENCOUNTER — Other Ambulatory Visit: Payer: Self-pay | Admitting: Medical

## 2021-06-07 ENCOUNTER — Other Ambulatory Visit (INDEPENDENT_AMBULATORY_CARE_PROVIDER_SITE_OTHER): Payer: Self-pay | Admitting: Physician Assistant

## 2021-06-07 DIAGNOSIS — F3289 Other specified depressive episodes: Secondary | ICD-10-CM

## 2021-06-10 NOTE — Telephone Encounter (Signed)
DR Raliegh Scarlet

## 2021-06-27 ENCOUNTER — Other Ambulatory Visit: Payer: Self-pay | Admitting: Family Medicine

## 2021-07-06 ENCOUNTER — Other Ambulatory Visit (INDEPENDENT_AMBULATORY_CARE_PROVIDER_SITE_OTHER): Payer: Self-pay | Admitting: Physician Assistant

## 2021-07-06 DIAGNOSIS — F3289 Other specified depressive episodes: Secondary | ICD-10-CM

## 2021-08-01 ENCOUNTER — Encounter: Payer: Self-pay | Admitting: Family Medicine

## 2021-08-01 ENCOUNTER — Other Ambulatory Visit (INDEPENDENT_AMBULATORY_CARE_PROVIDER_SITE_OTHER): Payer: BC Managed Care – PPO

## 2021-08-01 ENCOUNTER — Telehealth: Payer: BC Managed Care – PPO | Admitting: Family Medicine

## 2021-08-01 ENCOUNTER — Other Ambulatory Visit: Payer: Self-pay

## 2021-08-01 VITALS — BP 117/73 | Temp 98.4°F | Ht 63.0 in | Wt 250.0 lb

## 2021-08-01 DIAGNOSIS — J029 Acute pharyngitis, unspecified: Secondary | ICD-10-CM

## 2021-08-01 DIAGNOSIS — R0989 Other specified symptoms and signs involving the circulatory and respiratory systems: Secondary | ICD-10-CM

## 2021-08-01 DIAGNOSIS — R519 Headache, unspecified: Secondary | ICD-10-CM

## 2021-08-01 DIAGNOSIS — J309 Allergic rhinitis, unspecified: Secondary | ICD-10-CM | POA: Diagnosis not present

## 2021-08-01 LAB — POC COVID19 BINAXNOW: SARS Coronavirus 2 Ag: NEGATIVE

## 2021-08-01 NOTE — Patient Instructions (Signed)
Your rapid COVID test was negative.  The PCR is pending--keep an eye on your MyChart for results in the next 1-2 days. Wear a mask.  Continue to use Flonase and Xyzal for allergies. Use Mucinex (plain, and then either switch to DM or get a separate delsym syrup only if your cough is much worse). Use the albuterol if needed for wheezing.  Contact us or seek re-evaluation if you develop fever, discolored mucus, sinus pain, chest pain, shortness of breath (or frequent need for albuterol, or no response to albuterol), pain with breathing, or other new concerns.  I hope you feel better soon!

## 2021-08-01 NOTE — Progress Notes (Signed)
Start time: 9:52 End time: 10:08  Virtual Visit via Video Note  I connected with [name] on 08/01/21 by a video enabled telemedicine application and verified that I am speaking with the correct person using two identifiers.  Location: Patient: office at work Provider: office   I discussed the limitations of evaluation and management by telemedicine and the availability of in person appointments. The patient expressed understanding and agreed to proceed.  History of Present Illness:  Chief Complaint  Patient presents with   Sore Throat    VIRTUAL sore throat, runny nose, headache and some body aches. No fever or chills. ST started beginning of the week, HA started last evening.    Sore throat started Sunday or Monday, runny nose Monday and this week. Yesterday she had a headache, at both temples.  Denies sinus pain. She has some PND.  She has some cough, not getting up any phlegm.  Nasal mucus is clear.  Some itchy ears and throat, no eye symptoms.  She has allergies, and just restarted Flonase yesterday. She also takes Xyzal, took it for 2 days earlier in the week and ran out yesterday.  Feels more like a cold than allergies.  No sick contacts. No COVID test done. Had COVID vaccines x 3 Had COVID the end of May 2022.  PMH, PSH, SH reviewed  Outpatient Encounter Medications as of 08/01/2021  Medication Sig Note   fluticasone (FLONASE) 50 MCG/ACT nasal spray Place 2 sprays into both nostrils daily.    ibuprofen (ADVIL) 800 MG tablet TAKE 1 TABLET(800 MG) BY MOUTH EVERY 8 HOURS AS NEEDED FOR JAW PAIN 08/01/2021: Last dose 3pm yesterday   Insulin Pen Needle 32G X 4 MM MISC Use once daily with saxenda    levocetirizine (XYZAL) 5 MG tablet Take 1 tablet (5 mg total) by mouth every evening.    levonorgestrel (MIRENA) 20 MCG/24HR IUD 1 each by Intrauterine route once.    omeprazole (PRILOSEC) 40 MG capsule TAKE 1 CAPSULE (40 MG TOTAL) BY MOUTH DAILY.    phentermine 30 MG capsule  Take 30 mg by mouth every morning.    Vitamin D, Ergocalciferol, (DRISDOL) 1.25 MG (50000 UNIT) CAPS capsule Take 1 capsule (50,000 Units total) by mouth every 7 (seven) days.    albuterol (VENTOLIN HFA) 108 (90 Base) MCG/ACT inhaler TAKE 2 PUFFS BY MOUTH EVERY 6 HOURS AS NEEDED FOR WHEEZE OR SHORTNESS OF BREATH (Patient not taking: Reported on 08/01/2021)    EPINEPHrine 0.3 mg/0.3 mL IJ SOAJ injection Inject 0.3 mg into the muscle as needed for anaphylaxis. (Patient not taking: Reported on 08/01/2021)    escitalopram (LEXAPRO) 20 MG tablet Take 1 tablet (20 mg total) by mouth every morning. (Patient not taking: Reported on 08/01/2021)    Liraglutide -Weight Management (SAXENDA) 18 MG/3ML SOPN Inject 3 mg into the skin daily. (Patient not taking: Reported on 08/01/2021)    propranolol (INDERAL) 10 MG tablet Take 1 tablet (10 mg total) by mouth 4 (four) times daily as needed (palpitations/fast heart rate). (Patient not taking: Reported on 08/01/2021)    No facility-administered encounter medications on file as of 08/01/2021.   Allergies  Allergen Reactions   Shellfish Allergy Shortness Of Breath   ROS: No fever or chills, no chest pain, but has some tightness. Denies shortness of breath. Felt a little wheezy, but hasn't used her albuterol.  Has one at home. Fatigue. No significant body aches.    Observations/Objective:  BP 117/73 Comment: Tues at doctor appt  Temp 98.4 F (36.9 C) (Other (Comment)) Comment: no thermometer-is at work Comment (Src): forehead  Ht '5\' 3"'$  (1.6 m)   Wt 250 lb (113.4 kg)   BMI 44.29 kg/m   Well-appearing, pleasant female, in no distress She doesn't sound congested, no coughing during visit, no throat-clearing Exam is limited due to virtual nature of the visit.  Rapid COVID negative  Assessment and Plan:   Continue to use Flonase and Xyzal for allergies. Use Mucinex (plain, and then either switch to DM or get a separate delsym syrup only if your cough is much  worse). Use the albuterol if needed for wheezing.   Follow Up Instructions:    I discussed the assessment and treatment plan with the patient. The patient was provided an opportunity to ask questions and all were answered. The patient agreed with the plan and demonstrated an understanding of the instructions.   The patient was advised to call back or seek an in-person evaluation if the symptoms worsen or if the condition fails to improve as anticipated.  I spent 20 minutes dedicated to the care of this patient, including pre-visit review of records, face to face time, post-visit ordering of testing and documentation.    Vikki Ports, MD

## 2021-08-02 LAB — SARS-COV-2, NAA 2 DAY TAT

## 2021-08-02 LAB — NOVEL CORONAVIRUS, NAA: SARS-CoV-2, NAA: NOT DETECTED

## 2021-08-06 ENCOUNTER — Encounter (INDEPENDENT_AMBULATORY_CARE_PROVIDER_SITE_OTHER): Payer: Self-pay | Admitting: Adult Health

## 2021-08-06 ENCOUNTER — Ambulatory Visit (INDEPENDENT_AMBULATORY_CARE_PROVIDER_SITE_OTHER): Payer: BC Managed Care – PPO | Admitting: Adult Health

## 2021-08-06 ENCOUNTER — Other Ambulatory Visit: Payer: Self-pay

## 2021-08-06 VITALS — BP 135/77 | HR 78 | Temp 97.4°F | Ht 63.0 in | Wt 251.0 lb

## 2021-08-06 DIAGNOSIS — Z6841 Body Mass Index (BMI) 40.0 and over, adult: Secondary | ICD-10-CM | POA: Diagnosis not present

## 2021-08-06 DIAGNOSIS — E559 Vitamin D deficiency, unspecified: Secondary | ICD-10-CM

## 2021-08-06 DIAGNOSIS — F3289 Other specified depressive episodes: Secondary | ICD-10-CM

## 2021-08-06 DIAGNOSIS — Z9189 Other specified personal risk factors, not elsewhere classified: Secondary | ICD-10-CM

## 2021-08-06 MED ORDER — ESCITALOPRAM OXALATE 10 MG PO TABS: 10.0000 mg | ORAL_TABLET | Freq: Every morning | ORAL | 0 refills | Status: DC

## 2021-08-06 MED ORDER — VITAMIN D (ERGOCALCIFEROL) 1.25 MG (50000 UNIT) PO CAPS: 50000.0000 [IU] | ORAL_CAPSULE | ORAL | 0 refills | Status: DC

## 2021-08-06 MED ORDER — SAXENDA 18 MG/3ML ~~LOC~~ SOPN: 0.6000 mg | PEN_INJECTOR | Freq: Every day | SUBCUTANEOUS | 0 refills | Status: DC

## 2021-08-07 NOTE — Progress Notes (Signed)
Chief Complaint:   OBESITY Vanessa Copeland is here to discuss her progress with her obesity treatment plan along with follow-up of her obesity related diagnoses. Vanessa Copeland is on the Category 4 Plan and states she is following her eating plan approximately 25% of the time. [name] states she is walking 30 minutes 3 times per week.  Today's visit was #: 28 Starting weight: 256 lbs Starting date: 09/06/2019 Today's weight: 251 lbs Today's date: 08/06/2021 Total lbs lost to date: 4 Total lbs lost since last in-office visit: 0  Interim History: Vanessa Copeland's last OV at [facility] was 03/28/2021.  She stopped Lexapro 20 mg 2 weeks ago suddenly due to fatigue.  Pt has been off Saxenda 3 mg since June 2022 due to lack of insurance. PDMP reviewed- phentermine 30 mg  recently prescribed by outside provider at [facility].  She reports nausea with phentermine.  Subjective:   1. Vitamin D deficiency 11/27/2020 Vit D level was 36.8. She has been off prescription vitamin D 50,000 IU each week for 2 weeks.   2. Other depression, with emotional eating Vanessa Copeland was put on Lexapro 01/2020 as a replacement for PRN klonopin.  She abruptly stopped Lexapro '20mg'$  two weeks ago due to increased fatigue. She denies SI/HI.  3. At risk for constipation Vanessa Copeland is at risk for constipation due to re-starting GLP for obesity.  Assessment/Plan:   1. Vitamin D deficiency Low Vitamin D level contributes to fatigue and are associated with obesity, breast, and colon cancer. She agrees to continue to take prescription Vitamin D 50,000 IU every week and will follow-up for routine testing of Vitamin D, at least 2-3 times per year to avoid over-replacement. NEEDS LABS.  Do not refill until lab review- Vitamin D, Ergocalciferol, (DRISDOL) 1.25 MG (50000 UNIT) CAPS capsule; Take 1 capsule (50,000 Units total) by mouth every 7 (seven) days.  Dispense: 4 capsule; Refill: 0  2. Other depression, with emotional  eating Restart Lexapro 10 mg QD. Behavior modification techniques were discussed today to help Wrenn deal with her emotional/non-hunger eating behaviors.  Orders and follow up as documented in patient record.   Restart- escitalopram (LEXAPRO) 10 MG tablet; Take 1 tablet (10 mg total) by mouth every morning.  Dispense: 30 tablet; Refill: 0  3. At risk for constipation Vanessa Copeland was given approximately 15 minutes of counseling today regarding prevention of constipation. She was encouraged to increase water and fiber intake.    4. Obesity with current BMI of 42.5  Vanessa Copeland is currently in the action stage of change. As such, her goal is to continue with weight loss efforts. She has agreed to the Category 4 Plan.   Recommend stopping phentermine.  We discussed various medication options to help [name] with her weight loss efforts and we both agreed to restarting Saxenda 0.6 mg.  Re-start- Liraglutide -Weight Management (SAXENDA) 18 MG/3ML SOPN; Inject 0.6 mg into the skin daily.  Dispense: 3 mL; Refill: 0  Exercise goals:  As is  Behavioral modification strategies: increasing lean protein intake, decreasing simple carbohydrates, meal planning and cooking strategies, keeping healthy foods in the home, and planning for success.  Vanessa Copeland has agreed to follow-up with our clinic in 3 weeks. She was informed of the importance of frequent follow-up visits to maximize her success with intensive lifestyle modifications for her multiple health conditions.   Objective:   Blood pressure 135/77, pulse 78, temperature (!) 97.4 F (36.3 C), height '5\' 3"'$  (1.6 m), weight 251 lb (  113.9 kg), SpO2 99 %. Body mass index is 44.46 kg/m.  General: Cooperative, alert, well developed, in no acute distress. HEENT: Conjunctivae and lids unremarkable. Cardiovascular: Regular rhythm.  Lungs: Normal work of breathing. Neurologic: No focal deficits.   Lab Results  Component Value Date   CREATININE 0.81 11/27/2020    BUN 14 11/27/2020   NA 143 11/27/2020   K 4.5 11/27/2020   CL 104 11/27/2020   CO2 25 11/27/2020   Lab Results  Component Value Date   ALT 21 11/27/2020   AST 17 11/27/2020   ALKPHOS 89 11/27/2020   BILITOT 0.5 11/27/2020   Lab Results  Component Value Date   HGBA1C 5.6 11/27/2020   HGBA1C 5.8 (H) 07/24/2020   HGBA1C 5.4 03/28/2020   HGBA1C 5.1 01/02/2020   HGBA1C 5.6 09/06/2019   Lab Results  Component Value Date   INSULIN 3.8 11/27/2020   INSULIN 8.4 07/24/2020   INSULIN 6.6 03/28/2020   INSULIN 14.6 09/06/2019   Lab Results  Component Value Date   TSH 2.000 03/28/2020   Lab Results  Component Value Date   CHOL 178 11/27/2020   HDL 44 11/27/2020   LDLCALC 121 (H) 11/27/2020   TRIG 69 11/27/2020   CHOLHDL 4.0 11/27/2020   Lab Results  Component Value Date   VD25OH 36.8 11/27/2020   VD25OH 31.6 07/24/2020   VD25OH 39.8 03/28/2020   Lab Results  Component Value Date   WBC 7.9 03/29/2020   HGB 11.4 03/29/2020   HCT 34.6 03/29/2020   MCV 88 03/29/2020   PLT 169 03/29/2020   Lab Results  Component Value Date   IRON 57 12/27/2019   TIBC 321 12/27/2019   FERRITIN 51 12/27/2019   Attestation Statements:   Reviewed by clinician on day of visit: allergies, medications, problem list, medical history, surgical history, family history, social history, and previous encounter notes.  Coral Ceo, CMA, am acting as transcriptionist for Mina Marble, NP.  I have reviewed the above documentation for accuracy and completeness, and I agree with the above. -  Charles Andringa d. Ashani Pumphrey, NP-C

## 2021-08-08 ENCOUNTER — Encounter (INDEPENDENT_AMBULATORY_CARE_PROVIDER_SITE_OTHER): Payer: Self-pay

## 2021-08-12 DIAGNOSIS — F32A Depression, unspecified: Secondary | ICD-10-CM | POA: Insufficient documentation

## 2021-08-25 ENCOUNTER — Other Ambulatory Visit: Payer: Self-pay | Admitting: Family Medicine

## 2021-08-29 ENCOUNTER — Other Ambulatory Visit: Payer: Self-pay

## 2021-08-29 ENCOUNTER — Ambulatory Visit (INDEPENDENT_AMBULATORY_CARE_PROVIDER_SITE_OTHER): Payer: BC Managed Care – PPO | Admitting: Adult Health

## 2021-08-29 ENCOUNTER — Encounter (INDEPENDENT_AMBULATORY_CARE_PROVIDER_SITE_OTHER): Payer: Self-pay | Admitting: Adult Health

## 2021-08-29 VITALS — BP 121/80 | HR 71 | Temp 98.3°F | Ht 63.0 in | Wt 251.0 lb

## 2021-08-29 DIAGNOSIS — E7849 Other hyperlipidemia: Secondary | ICD-10-CM | POA: Diagnosis not present

## 2021-08-29 DIAGNOSIS — F3289 Other specified depressive episodes: Secondary | ICD-10-CM

## 2021-08-29 DIAGNOSIS — E559 Vitamin D deficiency, unspecified: Secondary | ICD-10-CM | POA: Diagnosis not present

## 2021-08-29 DIAGNOSIS — R7303 Prediabetes: Secondary | ICD-10-CM | POA: Diagnosis not present

## 2021-08-29 DIAGNOSIS — Z9189 Other specified personal risk factors, not elsewhere classified: Secondary | ICD-10-CM

## 2021-08-29 DIAGNOSIS — Z6841 Body Mass Index (BMI) 40.0 and over, adult: Secondary | ICD-10-CM

## 2021-08-29 MED ORDER — VITAMIN D (ERGOCALCIFEROL) 1.25 MG (50000 UNIT) PO CAPS: 50000.0000 [IU] | ORAL_CAPSULE | ORAL | 0 refills | Status: DC

## 2021-08-29 MED ORDER — SAXENDA 18 MG/3ML ~~LOC~~ SOPN: 1.2000 mg | PEN_INJECTOR | Freq: Every day | SUBCUTANEOUS | 0 refills | Status: DC

## 2021-08-29 NOTE — Progress Notes (Addendum)
Chief Complaint:   OBESITY Taleaha is here to discuss her progress with her obesity treatment plan along with follow-up of her obesity related diagnoses. Ngela is on the Category 3 Plan and states she is following her eating plan approximately 75% of the time. Vaniya states she is walking for 30 minutes 2 times per week.  Today's visit was #: 42 Starting weight: 256 lbs Starting date: 09/06/2019 Today's weight: 251 lbs Today's date: 08/29/2021 Total lbs lost to date: 5 lbs Total lbs lost since last in-office visit: 0  Interim History: Barri was restarted on Saxenda 0.6 mg daily at last office visit.   She denies mass in neck, dysphagia, dyspepsia, or persistent hoarseness. She has remained off phentermine since last office visit. She is still experiencing polyphagia.    Subjective:   1. Vitamin D deficiency On 11/27/2020, vitamin D level was 36.8.   She is currently taking prescription ergocalciferol 50,000 IU each week. She denies nausea, vomiting or muscle weakness.  Lab Results  Component Value Date   VD25OH 36.8 11/27/2020   VD25OH 31.6 07/24/2020   VD25OH 39.8 03/28/2020   2. Pre-diabetes Alaena has a diagnosis of prediabetes based on her elevated HgA1c and was informed this puts her at greater risk of developing diabetes.  On 11/27/2020, BG 83, A1c 5.6, insulin level 3.8 - all at goal.  She denies family history of T2D. She is on Saxenda 1.2 mg daily. She denies mass in neck, dysphagia, dyspepsia, or persistent hoarseness. She denies family hx of MTC or personal hx of pancreatitis.  Lab Results  Component Value Date   HGBA1C 5.6 11/27/2020   Lab Results  Component Value Date   INSULIN 3.8 11/27/2020   INSULIN 8.4 07/24/2020   INSULIN 6.6 03/28/2020   INSULIN 14.6 09/06/2019   3. Hyperlipidemia, pure She is unsure of family history of hyperlipidemia. LDL has been above goal at last 2 checks. She is not on statin therapy.  Lab Results  Component Value  Date   ALT 21 11/27/2020   AST 17 11/27/2020   ALKPHOS 89 11/27/2020   BILITOT 0.5 11/27/2020   Lab Results  Component Value Date   CHOL 178 11/27/2020   HDL 44 11/27/2020   LDLCALC 121 (H) 11/27/2020   TRIG 69 11/27/2020   CHOLHDL 4.0 11/27/2020   4. Other depression, with emotional eating Leinani was put on Lexapro 01/2020 as a replacement for PRN klonopin.  She abruptly stopped Lexapro '20mg'$  in early August due to increased fatigue. 08/06/21 Re-started Lexapro '10mg'$  QD. She denies SI/HI.  5. At risk for constipation Nimah is at increased risk for constipation due to increasing GLP-1 for obesity.  Assessment/Plan:   1. Vitamin D deficiency Low Vitamin D level contributes to fatigue and are associated with obesity, breast, and colon cancer. She agrees to continue to take prescription ergocalciferol '@50'$ ,000 IU every week.   Will check vitamin D level today.  - Refill Vitamin D, Ergocalciferol, (DRISDOL) 1.25 MG (50000 UNIT) CAPS capsule; Take 1 capsule (50,000 Units total) by mouth every 7 (seven) days.  Dispense: 4 capsule; Refill: 0 - VITAMIN D 25 Hydroxy (Vit-D Deficiency, Fractures)  2. Pre-diabetes Jerldine will continue to work on weight loss, exercise, and decreasing simple carbohydrates to help decrease the risk of diabetes.  Will check labs today.  - Comprehensive metabolic panel - Hemoglobin A1c - Insulin, random  3. Hyperlipidemia, pure Cardiovascular risk and specific lipid/LDL goals reviewed.  We discussed several lifestyle modifications today  and Lucas will continue to work on diet, exercise and weight loss efforts. Orders and follow up as documented in patient record.  Will check lipid panel today.  Counseling Intensive lifestyle modifications are the first line treatment for this issue. Dietary changes: Increase soluble fiber. Decrease simple carbohydrates. Exercise changes: Moderate to vigorous-intensity aerobic activity 150 minutes per week if  tolerated. Lipid-lowering medications: see documented in medical record.  - Lipid panel  4. Other depression, with emotional eating Continue Lexapro '10mg'$  QD.  5. At risk for constipation Tyionna was given approximately 15 minutes of counseling today regarding prevention of constipation. She was encouraged to increase water and fiber intake.    6. Obesity with current BMI of 44.5  - Increase and refill Liraglutide -Weight Management (SAXENDA) 18 MG/3ML SOPN; Inject 1.2 mg into the skin daily.  Dispense: 6 mL; Refill: 0  Makelle is currently in the action stage of change. As such, her goal is to continue with weight loss efforts. She has agreed to the Category 3 Plan.   Exercise goals:  As is.  Behavioral modification strategies: increasing lean protein intake, decreasing simple carbohydrates, meal planning and cooking strategies, keeping healthy foods in the home, and planning for success.  Foy has agreed to follow-up with our clinic in 3 weeks. She was informed of the importance of frequent follow-up visits to maximize her success with intensive lifestyle modifications for her multiple health conditions.   Annaleia was informed we would discuss her lab results at her next visit unless there is a critical issue that needs to be addressed sooner. Siddalee agreed to keep her next visit at the agreed upon time to discuss these results.  Objective:   Blood pressure 121/80, pulse 71, temperature 98.3 F (36.8 C), height '5\' 3"'$  (1.6 m), weight 251 lb (113.9 kg), SpO2 98 %. Body mass index is 44.46 kg/m.  General: Cooperative, alert, well developed, in no acute distress. HEENT: Conjunctivae and lids unremarkable. Cardiovascular: Regular rhythm.  Lungs: Normal work of breathing. Neurologic: No focal deficits.   Lab Results  Component Value Date   CREATININE 0.81 11/27/2020   BUN 14 11/27/2020   NA 143 11/27/2020   K 4.5 11/27/2020   CL 104 11/27/2020   CO2 25 11/27/2020   Lab  Results  Component Value Date   ALT 21 11/27/2020   AST 17 11/27/2020   ALKPHOS 89 11/27/2020   BILITOT 0.5 11/27/2020   Lab Results  Component Value Date   HGBA1C 5.6 11/27/2020   HGBA1C 5.8 (H) 07/24/2020   HGBA1C 5.4 03/28/2020   HGBA1C 5.1 01/02/2020   HGBA1C 5.6 09/06/2019   Lab Results  Component Value Date   INSULIN 3.8 11/27/2020   INSULIN 8.4 07/24/2020   INSULIN 6.6 03/28/2020   INSULIN 14.6 09/06/2019   Lab Results  Component Value Date   TSH 2.000 03/28/2020   Lab Results  Component Value Date   CHOL 178 11/27/2020   HDL 44 11/27/2020   LDLCALC 121 (H) 11/27/2020   TRIG 69 11/27/2020   CHOLHDL 4.0 11/27/2020   Lab Results  Component Value Date   VD25OH 36.8 11/27/2020   VD25OH 31.6 07/24/2020   VD25OH 39.8 03/28/2020   Lab Results  Component Value Date   WBC 7.9 03/29/2020   HGB 11.4 03/29/2020   HCT 34.6 03/29/2020   MCV 88 03/29/2020   PLT 169 03/29/2020   Lab Results  Component Value Date   IRON 57 12/27/2019   TIBC 321 12/27/2019  FERRITIN 51 12/27/2019   Attestation Statements:   Reviewed by clinician on day of visit: allergies, medications, problem list, medical history, surgical history, family history, social history, and previous encounter notes.  I, Water quality scientist, CMA, am acting as Location manager for Mina Marble, NP.  I have reviewed the above documentation for accuracy and completeness, and I agree with the above. -  Niah Heinle d. Eylin Pontarelli, NP-C

## 2021-08-30 LAB — COMPREHENSIVE METABOLIC PANEL
ALT: 16 IU/L (ref 0–32)
AST: 16 IU/L (ref 0–40)
Albumin/Globulin Ratio: 1.4 (ref 1.2–2.2)
Albumin: 4.3 g/dL (ref 3.8–4.9)
Alkaline Phosphatase: 86 IU/L (ref 44–121)
BUN/Creatinine Ratio: 14 (ref 9–23)
BUN: 11 mg/dL (ref 6–24)
Bilirubin Total: 0.6 mg/dL (ref 0.0–1.2)
CO2: 27 mmol/L (ref 20–29)
Calcium: 9.6 mg/dL (ref 8.7–10.2)
Chloride: 104 mmol/L (ref 96–106)
Creatinine, Ser: 0.76 mg/dL (ref 0.57–1.00)
Globulin, Total: 3.1 g/dL (ref 1.5–4.5)
Glucose: 80 mg/dL (ref 65–99)
Potassium: 4.5 mmol/L (ref 3.5–5.2)
Sodium: 143 mmol/L (ref 134–144)
Total Protein: 7.4 g/dL (ref 6.0–8.5)
eGFR: 94 mL/min/{1.73_m2} (ref >59)

## 2021-08-30 LAB — VITAMIN D 25 HYDROXY (VIT D DEFICIENCY, FRACTURES): Vit D, 25-Hydroxy: 34.4 ng/mL (ref 30.0–100.0)

## 2021-08-30 LAB — HEMOGLOBIN A1C
Est. average glucose Bld gHb Est-mCnc: 126 mg/dL
Hgb A1c MFr Bld: 6 % — ABNORMAL HIGH (ref 4.8–5.6)

## 2021-08-30 LAB — LIPID PANEL
Chol/HDL Ratio: 4 ratio (ref 0.0–4.4)
Cholesterol, Total: 174 mg/dL (ref 100–199)
HDL: 43 mg/dL (ref >39)
LDL Chol Calc (NIH): 118 mg/dL — ABNORMAL HIGH (ref 0–99)
Triglycerides: 69 mg/dL (ref 0–149)
VLDL Cholesterol Cal: 13 mg/dL (ref 5–40)

## 2021-08-30 LAB — INSULIN, RANDOM: INSULIN: 19.4 u[IU]/mL (ref 2.6–24.9)

## 2021-09-01 ENCOUNTER — Other Ambulatory Visit (INDEPENDENT_AMBULATORY_CARE_PROVIDER_SITE_OTHER): Payer: Self-pay | Admitting: Adult Health

## 2021-09-01 DIAGNOSIS — F3289 Other specified depressive episodes: Secondary | ICD-10-CM

## 2021-09-02 NOTE — Telephone Encounter (Signed)
LAST APPOINTMENT DATE: 08/29/21 NEXT APPOINTMENT DATE: 09/19/21   [id number] [place] [address] [address]Phone: [phone]: [phone] [facility] [address]Phone: [phone]: [phone] Patient is requesting a refill of the following medications: Pending Prescriptions:                       Disp   Refills   escitalopram (LEXAPRO) 10 MG tablet [Pharm*90 tab*1       Sig: Take 1 tablet (10 mg total) by mouth every morning.   Date last filled: 08/06/21 [facility] Lab Results      Component                Value               Date                      HGBA1C                   6.0 (H)             08/29/2021                HGBA1C                   5.6                 11/27/2020                HGBA1C                   5.8 (H)             07/24/2020           Lab Results      Component                Value               Date                      LDLCALC                  118 (H)             08/29/2021                CREATININE               0.76                08/29/2021           Lab Results      Component                Value               Date                      VD25OH                   34.4                08/29/2021  VD25OH                   36.8                11/27/2020                VD25OH                   31.6                07/24/2020            BP Readings from Last 3 Encounters: 08/29/21 : 121/80 08/06/21 : 135/77 08/01/21 : 117/73

## 2021-09-02 NOTE — Telephone Encounter (Signed)
Last seen by [facility] 08/29/21

## 2021-09-18 ENCOUNTER — Other Ambulatory Visit: Payer: Self-pay

## 2021-09-18 ENCOUNTER — Other Ambulatory Visit (INDEPENDENT_AMBULATORY_CARE_PROVIDER_SITE_OTHER): Payer: BC Managed Care – PPO

## 2021-09-18 DIAGNOSIS — Z23 Encounter for immunization: Secondary | ICD-10-CM

## 2021-09-19 ENCOUNTER — Encounter (INDEPENDENT_AMBULATORY_CARE_PROVIDER_SITE_OTHER): Payer: Self-pay | Admitting: Adult Health

## 2021-09-19 ENCOUNTER — Other Ambulatory Visit: Payer: Self-pay

## 2021-09-19 ENCOUNTER — Ambulatory Visit (INDEPENDENT_AMBULATORY_CARE_PROVIDER_SITE_OTHER): Payer: BC Managed Care – PPO | Admitting: Adult Health

## 2021-09-19 VITALS — BP 119/77 | HR 69 | Temp 98.1°F | Ht 63.0 in | Wt 251.0 lb

## 2021-09-19 DIAGNOSIS — Z9189 Other specified personal risk factors, not elsewhere classified: Secondary | ICD-10-CM

## 2021-09-19 DIAGNOSIS — Z6841 Body Mass Index (BMI) 40.0 and over, adult: Secondary | ICD-10-CM

## 2021-09-19 DIAGNOSIS — E559 Vitamin D deficiency, unspecified: Secondary | ICD-10-CM | POA: Diagnosis not present

## 2021-09-19 DIAGNOSIS — E7849 Other hyperlipidemia: Secondary | ICD-10-CM | POA: Diagnosis not present

## 2021-09-19 DIAGNOSIS — R7303 Prediabetes: Secondary | ICD-10-CM

## 2021-09-19 MED ORDER — VITAMIN D (ERGOCALCIFEROL) 1.25 MG (50000 UNIT) PO CAPS: 50000.0000 [IU] | ORAL_CAPSULE | ORAL | 0 refills | Status: DC

## 2021-09-19 MED ORDER — SAXENDA 18 MG/3ML ~~LOC~~ SOPN: 1.8000 mg | PEN_INJECTOR | Freq: Every day | SUBCUTANEOUS | 0 refills | Status: DC

## 2021-09-19 MED ORDER — INSULIN PEN NEEDLE 32G X 4 MM MISC: 0 refills | Status: DC

## 2021-09-19 NOTE — Progress Notes (Signed)
Chief Complaint:   OBESITY Vanessa Copeland is here to discuss her progress with her obesity treatment plan along with follow-up of her obesity related diagnoses. Vanessa Copeland is on the Category 3 Plan and states she is following her eating plan approximately 75% of the time. Vanessa Copeland states she is walking for 30 minutes 2 times per week.  Today's visit was #: 48 Starting weight: 256 lbs Starting date: 09/06/2019 Today's weight: 251 lbs Today's date: 09/19/2021 Total lbs lost to date: 5 lbs Total lbs lost since last in-office visit: 0  Interim History: Vanessa Copeland is on Saxenda 1.2 mg.  She denies mass in neck, dysphagia, dyspepsia, or persistent hoarseness. She denies family hx of MTC or personal hx of pancreatitis.   Subjective:   1. Pre-diabetes On 08/29/2021, A1c increased - 6.0. She denies family history of T2D. She is on Saxenda 1.2 mg daily. She denies mass in neck, dysphagia, dyspepsia, or persistent hoarseness. She denies family hx of MTC or personal hx of pancreatitis.   2. Vitamin D deficiency On 08/29/2021, vitamin D level - 34.4 - below goal of 50. She is currently taking prescription ergocalciferol 50,000 IU each week. She denies nausea, vomiting or muscle weakness.  3. Other hyperlipidemia On 08/29/2021, lipid panel - light decrease LDL - still above goal at 118. She believes her mother has HLD. She denies acute cardiac symptoms at present.  4. At risk for nausea Vanessa Copeland is at risk for nausea due to increasing her GLP-1 for worsening prediabetes.  Assessment/Plan:   1. Pre-diabetes Worsening. Discussed labs with patient today.  Increase Saxenda to 1.8 mg daily. Man will continue to work on weight loss, exercise, and decreasing simple carbohydrates to help decrease the risk of diabetes.   2. Vitamin D deficiency Discussed labs with patient today.  Refill ergocalciferol 50,000 IU once weekly, as per below. Low Vitamin D level contributes to fatigue and are associated with  obesity, breast, and colon cancer. She agrees to continue to take prescription Vitamin D @50 ,000 IU every week and will follow-up for routine testing of Vitamin D, at least 2-3 times per year to avoid over-replacement.  - Refill Vitamin D, Ergocalciferol, (DRISDOL) 1.25 MG (50000 UNIT) CAPS capsule; Take 1 capsule (50,000 Units total) by mouth every 7 (seven) days.  Dispense: 4 capsule; Refill: 0  3. Other hyperlipidemia Discussed labs with patient today.  Monitor labs, decrease saturated fat, increase daily walking. Cardiovascular risk and specific lipid/LDL goals reviewed.  We discussed several lifestyle modifications today and Vanessa Copeland will continue to work on diet, exercise and weight loss efforts. Orders and follow up as documented in patient record.   Counseling Intensive lifestyle modifications are the first line treatment for this issue. Dietary changes: Increase soluble fiber. Decrease simple carbohydrates. Exercise changes: Moderate to vigorous-intensity aerobic activity 150 minutes per week if tolerated. Lipid-lowering medications: see documented in medical record.  4. At risk for nausea Vanessa Copeland was given approximately 15 minutes of nausea prevention counseling today. Vanessa Copeland is at risk for nausea due to her new or current medication. She was encouraged to titrate her medication slowly, make sure to stay hydrated, eat smaller portions throughout the day, and avoid high fat meals.   5. Obesity with current BMI of 44.5  - Refill Insulin Pen Needle 32G X 4 MM MISC; Use once daily with saxenda  Dispense: 100 each; Refill: 0 - Increase and refill Liraglutide -Weight Management (SAXENDA) 18 MG/3ML SOPN; Inject 1.8 mg into the skin daily.  Dispense: 9 mL; Refill: 0  Vanessa Copeland is currently in the action stage of change. As such, her goal is to continue with weight loss efforts. She has agreed to the Category 3 Plan.   Refill pan needles.  Increase Saxenda to 1.8 mg daily, dispense 9  mL, 0 refills.  Exercise goals:  As is.  Behavioral modification strategies: increasing lean protein intake, decreasing simple carbohydrates, meal planning and cooking strategies, keeping healthy foods in the home, and planning for success.  Vanessa Copeland has agreed to follow-up with our clinic in 3-4 weeks. She was informed of the importance of frequent follow-up visits to maximize her success with intensive lifestyle modifications for her multiple health conditions.   Objective:   Blood pressure 119/77, pulse 69, temperature 98.1 F (36.7 C), height 5\' 3"  (1.6 m), weight 251 lb (113.9 kg), SpO2 98 %. Body mass index is 44.46 kg/m.  General: Cooperative, alert, well developed, in no acute distress. HEENT: Conjunctivae and lids unremarkable. Cardiovascular: Regular rhythm.  Lungs: Normal work of breathing. Neurologic: No focal deficits.   Lab Results  Component Value Date   CREATININE 0.76 08/29/2021   BUN 11 08/29/2021   NA 143 08/29/2021   K 4.5 08/29/2021   CL 104 08/29/2021   CO2 27 08/29/2021   Lab Results  Component Value Date   ALT 16 08/29/2021   AST 16 08/29/2021   ALKPHOS 86 08/29/2021   BILITOT 0.6 08/29/2021   Lab Results  Component Value Date   HGBA1C 6.0 (H) 08/29/2021   HGBA1C 5.6 11/27/2020   HGBA1C 5.8 (H) 07/24/2020   HGBA1C 5.4 03/28/2020   HGBA1C 5.1 01/02/2020   Lab Results  Component Value Date   INSULIN 19.4 08/29/2021   INSULIN 3.8 11/27/2020   INSULIN 8.4 07/24/2020   INSULIN 6.6 03/28/2020   INSULIN 14.6 09/06/2019   Lab Results  Component Value Date   TSH 2.000 03/28/2020   Lab Results  Component Value Date   CHOL 174 08/29/2021   HDL 43 08/29/2021   LDLCALC 118 (H) 08/29/2021   TRIG 69 08/29/2021   CHOLHDL 4.0 08/29/2021   Lab Results  Component Value Date   VD25OH 34.4 08/29/2021   VD25OH 36.8 11/27/2020   VD25OH 31.6 07/24/2020   Lab Results  Component Value Date   WBC 7.9 03/29/2020   HGB 11.4 03/29/2020   HCT 34.6  03/29/2020   MCV 88 03/29/2020   PLT 169 03/29/2020   Lab Results  Component Value Date   IRON 57 12/27/2019   TIBC 321 12/27/2019   FERRITIN 51 12/27/2019   Attestation Statements:   Reviewed by clinician on day of visit: allergies, medications, problem list, medical history, surgical history, family history, social history, and previous encounter notes.  I, Water quality scientist, CMA, am acting as Location manager for Mina Marble, NP.  I have reviewed the above documentation for accuracy and completeness, and I agree with the above. -  Yesenia Fontenette d. [name] Rohlman, NP-C

## 2021-10-17 ENCOUNTER — Ambulatory Visit (INDEPENDENT_AMBULATORY_CARE_PROVIDER_SITE_OTHER): Payer: BC Managed Care – PPO | Admitting: Adult Health

## 2021-10-21 ENCOUNTER — Encounter (INDEPENDENT_AMBULATORY_CARE_PROVIDER_SITE_OTHER): Payer: Self-pay | Admitting: Family Medicine

## 2021-10-21 ENCOUNTER — Ambulatory Visit (INDEPENDENT_AMBULATORY_CARE_PROVIDER_SITE_OTHER): Payer: BC Managed Care – PPO

## 2021-10-21 ENCOUNTER — Encounter: Payer: Self-pay | Admitting: Podiatry

## 2021-10-21 ENCOUNTER — Ambulatory Visit (INDEPENDENT_AMBULATORY_CARE_PROVIDER_SITE_OTHER): Payer: BC Managed Care – PPO | Admitting: Family Medicine

## 2021-10-21 ENCOUNTER — Ambulatory Visit: Payer: BC Managed Care – PPO | Admitting: Podiatry

## 2021-10-21 ENCOUNTER — Other Ambulatory Visit: Payer: Self-pay

## 2021-10-21 VITALS — BP 124/82 | HR 78 | Temp 97.7°F | Ht 63.0 in | Wt 252.0 lb

## 2021-10-21 DIAGNOSIS — G8929 Other chronic pain: Secondary | ICD-10-CM | POA: Diagnosis not present

## 2021-10-21 DIAGNOSIS — M7732 Calcaneal spur, left foot: Secondary | ICD-10-CM

## 2021-10-21 DIAGNOSIS — M722 Plantar fascial fibromatosis: Secondary | ICD-10-CM

## 2021-10-21 DIAGNOSIS — R7303 Prediabetes: Secondary | ICD-10-CM | POA: Diagnosis not present

## 2021-10-21 DIAGNOSIS — M79672 Pain in left foot: Secondary | ICD-10-CM | POA: Diagnosis not present

## 2021-10-21 DIAGNOSIS — E559 Vitamin D deficiency, unspecified: Secondary | ICD-10-CM

## 2021-10-21 DIAGNOSIS — F3289 Other specified depressive episodes: Secondary | ICD-10-CM | POA: Diagnosis not present

## 2021-10-21 DIAGNOSIS — Z9189 Other specified personal risk factors, not elsewhere classified: Secondary | ICD-10-CM

## 2021-10-21 DIAGNOSIS — Z6841 Body Mass Index (BMI) 40.0 and over, adult: Secondary | ICD-10-CM

## 2021-10-21 MED ORDER — TIRZEPATIDE 5 MG/0.5ML ~~LOC~~ SOAJ: 5.0000 mg | SUBCUTANEOUS | 0 refills | Status: DC

## 2021-10-21 MED ORDER — VITAMIN D (ERGOCALCIFEROL) 1.25 MG (50000 UNIT) PO CAPS: 50000.0000 [IU] | ORAL_CAPSULE | ORAL | 0 refills | Status: DC

## 2021-10-21 MED ORDER — ESCITALOPRAM OXALATE 10 MG PO TABS: 10.0000 mg | ORAL_TABLET | Freq: Every morning | ORAL | 1 refills | Status: DC

## 2021-10-21 MED ORDER — TRIAMCINOLONE ACETONIDE 10 MG/ML IJ SUSP: 10.0000 mg | Freq: Once | INTRAMUSCULAR | Status: DC

## 2021-10-21 MED ORDER — MELOXICAM 15 MG PO TABS: 15.0000 mg | ORAL_TABLET | Freq: Every day | ORAL | 0 refills | Status: DC | PRN

## 2021-10-21 NOTE — Progress Notes (Signed)
Chief Complaint:   OBESITY Vanessa Copeland is here to discuss her progress with her obesity treatment plan along with follow-up of her obesity related diagnoses. Vanessa Copeland is on the Category 3 Plan and states she is following her eating plan approximately 80% of the time. Vanessa Copeland states she is doing 0 minutes 0 times per week.  Today's visit was #: 20 Starting weight: 256 lbs Starting date: 09/06/2019 Today's weight: 252 lbs Today's date: 10/21/2021 Total lbs lost to date: 4 lbs Total lbs lost since last in-office visit: 0  Interim History: Vanessa Copeland's dose of Saxenda was increased last office visit to 1.8 mg. She does not feel the Kirke Shaggy is helping with appetite.  She is on plan at breakfast. She skips lunch due to lack of meal prep. Her family tends to eat out at supper.   Subjective:   1. Pre-diabetes [name]'s last A1C was 6.0. She has been on Saxenda but doesn't feel it is helping. She denies family/personal history of thyroid cancer. She has  no history of pancreatitis or cholelithiasis.  Lab Results  Component Value Date   HGBA1C 6.0 (H) 08/29/2021   Lab Results  Component Value Date   INSULIN 19.4 08/29/2021   INSULIN 3.8 11/27/2020   INSULIN 8.4 07/24/2020   INSULIN 6.6 03/28/2020   INSULIN 14.6 09/06/2019    2. Vitamin D deficiency Vanessa Copeland's Vitamin D was low at 34.4. She is on weekly prescription Vitamin D.   Lab Results  Component Value Date   VD25OH 34.4 08/29/2021   VD25OH 36.8 11/27/2020   VD25OH 31.6 07/24/2020    3. Other depression, with emotional eating Vanessa Copeland notes her mood is stable. She is on Lexapro. She does note some craving.   4. At risk for side effect of medication Mally is at risk for side effect of medication due to start of Mounjaro.   Assessment/Plan:   1. Pre-diabetes Katerra will discontinue Saxenda. She agrees to start Mounjaro 5 mg weekly. A coupon for Darcel Bayley was provided.  We discussed it being used off label for weight loss.  -  tirzepatide [facility]) 5 MG/0.5ML Pen; Inject 5 mg into the skin once a week.  Dispense: 6 mL; Refill: 0  2. Vitamin D deficiency  We will refill prescription Vitamin D 50,000 IU every week and Carolie will follow-up for routine testing of Vitamin D, at least 2-3 times per year to avoid over-replacement.  - Vitamin D, Ergocalciferol, (DRISDOL) 1.25 MG (50000 UNIT) CAPS capsule; Take 1 capsule (50,000 Units total) by mouth every 7 (seven) days.  Dispense: 4 capsule; Refill: 0  3. Other depression, with emotional eating Behavior modification techniques were discussed today to help Vanessa Copeland deal with her emotional/non-hunger eating behaviors.  We will refill Lexapro 10 mg daily for 3 months with no refills. Orders and follow up as documented in patient record.    - escitalopram (LEXAPRO) 10 MG tablet; Take 1 tablet (10 mg total) by mouth every morning.  Dispense: 90 tablet; Refill: 1  4. At risk for side effect of medication Vanessa Copeland was given approximately 15 minutes of drug side effect counseling today.  We discussed side effect possibility and risk versus benefits. Swati agreed to the medication and will contact this office if these side effects are intolerable.  Repetitive spaced learning was employed today to elicit superior memory formation and behavioral change.   5. Obesity with current BMI of 44.65 Vanessa Copeland is currently in the action stage of change. As such, her goal is to  continue with weight loss efforts. She has agreed to the Category 3 Plan.   Vanessa Copeland will make a grocery list and meal plan for the week. She will carry in food for lunch Monday morning at work.   Exercise goals: All adults should avoid inactivity. Some physical activity is better than none, and adults who participate in any amount of physical activity gain some health benefits.  Behavioral modification strategies: increasing lean protein intake, decreasing simple carbohydrates, no skipping meals, meal planning and cooking  strategies, and holiday eating strategies .  Vanessa Copeland has agreed to follow-up with our clinic in 3-4 weeks. Objective:   Blood pressure 124/82, pulse 78, temperature 97.7 F (36.5 C), height 5\' 3"  (1.6 m), weight 252 lb (114.3 kg), SpO2 99 %. Body mass index is 44.64 kg/m.  General: Cooperative, alert, well developed, in no acute distress. HEENT: Conjunctivae and lids unremarkable. Cardiovascular: Regular rhythm.  Lungs: Normal work of breathing. Neurologic: No focal deficits.   Lab Results  Component Value Date   CREATININE 0.76 08/29/2021   BUN 11 08/29/2021   NA 143 08/29/2021   K 4.5 08/29/2021   CL 104 08/29/2021   CO2 27 08/29/2021   Lab Results  Component Value Date   ALT 16 08/29/2021   AST 16 08/29/2021   ALKPHOS 86 08/29/2021   BILITOT 0.6 08/29/2021   Lab Results  Component Value Date   HGBA1C 6.0 (H) 08/29/2021   HGBA1C 5.6 11/27/2020   HGBA1C 5.8 (H) 07/24/2020   HGBA1C 5.4 03/28/2020   HGBA1C 5.1 01/02/2020   Lab Results  Component Value Date   INSULIN 19.4 08/29/2021   INSULIN 3.8 11/27/2020   INSULIN 8.4 07/24/2020   INSULIN 6.6 03/28/2020   INSULIN 14.6 09/06/2019   Lab Results  Component Value Date   TSH 2.000 03/28/2020   Lab Results  Component Value Date   CHOL 174 08/29/2021   HDL 43 08/29/2021   LDLCALC 118 (H) 08/29/2021   TRIG 69 08/29/2021   CHOLHDL 4.0 08/29/2021   Lab Results  Component Value Date   VD25OH 34.4 08/29/2021   VD25OH 36.8 11/27/2020   VD25OH 31.6 07/24/2020   Lab Results  Component Value Date   WBC 7.9 03/29/2020   HGB 11.4 03/29/2020   HCT 34.6 03/29/2020   MCV 88 03/29/2020   PLT 169 03/29/2020   Lab Results  Component Value Date   IRON 57 12/27/2019   TIBC 321 12/27/2019   FERRITIN 51 12/27/2019   Attestation Statements:   Reviewed by clinician on day of visit: allergies, medications, problem list, medical history, surgical history, family history, social history, and previous encounter  notes.  I, Lizbeth Bark, RMA, am acting as Location manager for [employer], [place].   I have reviewed the above documentation for accuracy and completeness, and I agree with the above. -  Georgianne Fick, FNP

## 2021-10-21 NOTE — Patient Instructions (Signed)
If was nice to meet you today. If you have any questions or any further concerns, please feel fee to give me a call. You can call our office at [phone] or please feel fee to send me a message through MyChart.   ----  For instructions on how to put on your Plantar Fascial Brace, please visit [email or web address]  ---     Plantar Fasciitis (Heel Spur Syndrome) with Rehab The plantar fascia is a fibrous, ligament-like, soft-tissue structure that spans the bottom of the foot. Plantar fasciitis is a condition that causes pain in the foot due to inflammation of the tissue. SYMPTOMS  Pain and tenderness on the underneath side of the foot. Pain that worsens with standing or walking. CAUSES  Plantar fasciitis is caused by irritation and injury to the plantar fascia on the underneath side of the foot. Common mechanisms of injury include: Direct trauma to bottom of the foot. Damage to a small nerve that runs under the foot where the main fascia attaches to the heel bone. Stress placed on the plantar fascia due to bone spurs. RISK INCREASES WITH:  Activities that place stress on the plantar fascia (running, jumping, pivoting, or cutting). Poor strength and flexibility. Improperly fitted shoes. Tight calf muscles. Flat feet. Failure to warm-up properly before activity. Obesity. PREVENTION Warm up and stretch properly before activity. Allow for adequate recovery between workouts. Maintain physical fitness: Strength, flexibility, and endurance. Cardiovascular fitness. Maintain a health body weight. Avoid stress on the plantar fascia. Wear properly fitted shoes, including arch supports for individuals who have flat feet.  PROGNOSIS  If treated properly, then the symptoms of plantar fasciitis usually resolve without surgery. However, occasionally surgery is necessary.  RELATED COMPLICATIONS  Recurrent symptoms that may result in a chronic condition. Problems of the lower back  that are caused by compensating for the injury, such as limping. Pain or weakness of the foot during push-off following surgery. Chronic inflammation, scarring, and partial or complete fascia tear, occurring more often from repeated injections.  TREATMENT  Treatment initially involves the use of ice and medication to help reduce pain and inflammation. The use of strengthening and stretching exercises may help reduce pain with activity, especially stretches of the Achilles tendon. These exercises may be performed at home or with a therapist. Your caregiver may recommend that you use heel cups of arch supports to help reduce stress on the plantar fascia. Occasionally, corticosteroid injections are given to reduce inflammation. If symptoms persist for greater than 6 months despite non-surgical (conservative), then surgery may be recommended.   MEDICATION  If pain medication is necessary, then nonsteroidal anti-inflammatory medications, such as aspirin and ibuprofen, or other minor pain relievers, such as acetaminophen, are often recommended. Do not take pain medication within 7 days before surgery. Prescription pain relievers may be given if deemed necessary by your caregiver. Use only as directed and only as much as you need. Corticosteroid injections may be given by your caregiver. These injections should be reserved for the most serious cases, because they may only be given a certain number of times.  HEAT AND COLD Cold treatment (icing) relieves pain and reduces inflammation. Cold treatment should be applied for 10 to 15 minutes every 2 to 3 hours for inflammation and pain and immediately after any activity that aggravates your symptoms. Use ice packs or massage the area with a piece of ice (ice massage). Heat treatment may be used prior to performing the stretching and strengthening activities prescribed by your caregiver,   physical therapist, or athletic trainer. Use a heat pack or soak the injury  in warm water.  SEEK IMMEDIATE MEDICAL CARE IF: Treatment seems to offer no benefit, or the condition worsens. Any medications produce adverse side effects.  EXERCISES- RANGE OF MOTION (ROM) AND STRETCHING EXERCISES - Plantar Fasciitis (Heel Spur Syndrome) These exercises may help you when beginning to rehabilitate your injury. Your symptoms may resolve with or without further involvement from your physician, physical therapist or athletic trainer. While completing these exercises, remember:  Restoring tissue flexibility helps normal motion to return to the joints. This allows healthier, less painful movement and activity. An effective stretch should be held for at least 30 seconds. A stretch should never be painful. You should only feel a gentle lengthening or release in the stretched tissue.  RANGE OF MOTION - Toe Extension, Flexion Sit with your right / left leg crossed over your opposite knee. Grasp your toes and gently pull them back toward the top of your foot. You should feel a stretch on the bottom of your toes and/or foot. Hold this stretch for 10 seconds. Now, gently pull your toes toward the bottom of your foot. You should feel a stretch on the top of your toes and or foot. Hold this stretch for 10 seconds. Repeat  times. Complete this stretch 3 times per day.   RANGE OF MOTION - Ankle Dorsiflexion, Active Assisted Remove shoes and sit on a chair that is preferably not on a carpeted surface. Place right / left foot under knee. Extend your opposite leg for support. Keeping your heel down, slide your right / left foot back toward the chair until you feel a stretch at your ankle or calf. If you do not feel a stretch, slide your bottom forward to the edge of the chair, while still keeping your heel down. Hold this stretch for 10 seconds. Repeat 3 times. Complete this stretch 2 times per day.   STRETCH  Gastroc, Standing Place hands on wall. Extend right / left leg, keeping the  front knee somewhat bent. Slightly point your toes inward on your back foot. Keeping your right / left heel on the floor and your knee straight, shift your weight toward the wall, not allowing your back to arch. You should feel a gentle stretch in the right / left calf. Hold this position for 10 seconds. Repeat 3 times. Complete this stretch 2 times per day.  STRETCH  Soleus, Standing Place hands on wall. Extend right / left leg, keeping the other knee somewhat bent. Slightly point your toes inward on your back foot. Keep your right / left heel on the floor, bend your back knee, and slightly shift your weight over the back leg so that you feel a gentle stretch deep in your back calf. Hold this position for 10 seconds. Repeat 3 times. Complete this stretch 2 times per day.  STRETCH  Gastrocsoleus, Standing  Note: This exercise can place a lot of stress on your foot and ankle. Please complete this exercise only if specifically instructed by your caregiver.  Place the ball of your right / left foot on a step, keeping your other foot firmly on the same step. Hold on to the wall or a rail for balance. Slowly lift your other foot, allowing your body weight to press your heel down over the edge of the step. You should feel a stretch in your right / left calf. Hold this position for 10 seconds. Repeat this exercise with a   slight bend in your right / left knee. Repeat 3 times. Complete this stretch 2 times per day.   STRENGTHENING EXERCISES - Plantar Fasciitis (Heel Spur Syndrome)  These exercises may help you when beginning to rehabilitate your injury. They may resolve your symptoms with or without further involvement from your physician, physical therapist or athletic trainer. While completing these exercises, remember:  Muscles can gain both the endurance and the strength needed for everyday activities through controlled exercises. Complete these exercises as instructed by your physician,  physical therapist or athletic trainer. Progress the resistance and repetitions only as guided.  STRENGTH - Towel Curls Sit in a chair positioned on a non-carpeted surface. Place your foot on a towel, keeping your heel on the floor. Pull the towel toward your heel by only curling your toes. Keep your heel on the floor. Repeat 3 times. Complete this exercise 2 times per day.  STRENGTH - Ankle Inversion Secure one end of a rubber exercise band/tubing to a fixed object (table, pole). Loop the other end around your foot just before your toes. Place your fists between your knees. This will focus your strengthening at your ankle. Slowly, pull your big toe up and in, making sure the band/tubing is positioned to resist the entire motion. Hold this position for 10 seconds. Have your muscles resist the band/tubing as it slowly pulls your foot back to the starting position. Repeat 3 times. Complete this exercises 2 times per day.  Document Released: 12/08/2005 Document Revised: 03/01/2012 Document Reviewed: 03/22/2009 [employer] Patient Information 2014 [employer].  

## 2021-10-23 NOTE — Progress Notes (Signed)
Subjective:   Patient ID: Vanessa Copeland, female   DOB: 55 y.o.   MRN: [id number]   HPI 55 year old female presents the office with concerns of left foot pain, heel pain.  She said the pain gets worse throughout the day after she has been on her feet a lot.  Denies any recent injury or trauma.  She does get sharp pain to her heel.  No swelling.  She has tried Tylenol as well as rolling a ball on her foot with minimal relief.  She has no other concerns today.   Review of Systems  All other systems reviewed and are negative.  Past Medical History:  Diagnosis Date   Allergy    Anemia    Anxiety    Asthma    mild intermittent(when allergies are flaring)   Complication of anesthesia    Food allergy    Shellfish   H/O echocardiogram 12/2005   Trace MR, mild TR, nl L ventricular systolic function.   Heart murmur    since birth. Never has had any problems   Hepatic steatosis    per CT   History of kidney stones    Kidney stones    Morbid obesity ([place]) 04/29/2019   Obesity, unspecified    PONV (postoperative nausea and vomiting)    Vitamin D deficiency 01/2010    Past Surgical History:  Procedure Laterality Date   CESAREAN SECTION     x3   COLONOSCOPY     CYSTOSCOPY WITH RETROGRADE PYELOGRAM, URETEROSCOPY AND STENT PLACEMENT Left 07/16/2018   Procedure: CYSTOSCOPY WITH RETROGRADE PYELOGRAM, URETEROSCOPY AND STENT PLACEMENT;  Surgeon: Cleon Gustin, MD;  Location: [facility];  Service: Urology;  Laterality: Left;   EXTRACORPOREAL SHOCK WAVE LITHOTRIPSY Left 06/21/2018   Procedure: LEFT EXTRACORPOREAL SHOCK WAVE LITHOTRIPSY (ESWL);  Surgeon: Franchot Gallo, MD;  Location: [facility];  Service: Urology;  Laterality: Left;   HOLMIUM LASER APPLICATION Left 0/12/7492   Procedure: HOLMIUM LASER APPLICATION;  Surgeon: Cleon Gustin, MD;  Location: [facility];  Service: Urology;  Laterality: Left;   LUMBAR DISC SURGERY  2019   OOPHORECTOMY  2002   left due to cyst   POLYPECTOMY     TUBAL  LIGATION  1996     Current Outpatient Medications:    meloxicam (MOBIC) 15 MG tablet, Take 1 tablet (15 mg total) by mouth daily as needed for pain., Disp: 30 tablet, Rfl: 0   albuterol (VENTOLIN HFA) 108 (90 Base) MCG/ACT inhaler, TAKE 2 PUFFS BY MOUTH EVERY 6 HOURS AS NEEDED FOR WHEEZE OR SHORTNESS OF BREATH, Disp: 18 g, Rfl: 0   EPINEPHrine 0.3 mg/0.3 mL IJ SOAJ injection, Inject 0.3 mg into the muscle as needed for anaphylaxis., Disp: 1 each, Rfl: 0   escitalopram (LEXAPRO) 10 MG tablet, Take 1 tablet (10 mg total) by mouth every morning., Disp: 90 tablet, Rfl: 1   fluticasone (FLONASE) 50 MCG/ACT nasal spray, Place 2 sprays into both nostrils daily., Disp: 16 g, Rfl: 6   ibuprofen (ADVIL) 800 MG tablet, TAKE 1 TABLET(800 MG) BY MOUTH EVERY 8 HOURS AS NEEDED FOR JAW PAIN, Disp: 30 tablet, Rfl: 1   levocetirizine (XYZAL) 5 MG tablet, Take 1 tablet (5 mg total) by mouth every evening., Disp: 30 tablet, Rfl: 2   levonorgestrel (MIRENA) 20 MCG/24HR IUD, 1 each by Intrauterine route once., Disp: , Rfl:    omeprazole (PRILOSEC) 40 MG capsule, TAKE 1 CAPSULE (40 MG TOTAL) BY MOUTH DAILY., Disp: 90 capsule, Rfl: 0  propranolol (INDERAL) 10 MG tablet, Take 1 tablet (10 mg total) by mouth 4 (four) times daily as needed (palpitations/fast heart rate)., Disp: 60 tablet, Rfl: 11   tirzepatide (MOUNJARO) 5 MG/0.5ML Pen, Inject 5 mg into the skin once a week., Disp: 6 mL, Rfl: 0   Vitamin D, Ergocalciferol, (DRISDOL) 1.25 MG (50000 UNIT) CAPS capsule, Take 1 capsule (50,000 Units total) by mouth every 7 (seven) days., Disp: 4 capsule, Rfl: 0  Current Facility-Administered Medications:    triamcinolone acetonide (KENALOG) 10 MG/ML injection 10 mg, 10 mg, Other, Once, Almon Whitford, Bonna Gains, DPM  Allergies  Allergen Reactions   Shellfish Allergy Shortness Of Breath          Objective:  Physical Exam  General: AAO x3, NAD  Dermatological: Skin is warm, dry and supple bilateral. There are no open  sores, no preulcerative lesions, no rash or signs of infection present.  Vascular: Dorsalis Pedis artery and Posterior Tibial artery pedal pulses are 2/4 bilateral with immedate capillary fill time.  There is no pain with calf compression, swelling, warmth, erythema.   Neruologic: Grossly intact via light touch bilateral.  Negative Tinel sign.  Musculoskeletal:Tenderness to palpation along the plantar medial tubercle of the calcaneus at the insertion of plantar fascia on the left foot. There is no pain along the course of the plantar fascia within the arch of the foot. Plantar fascia appears to be intact. There is no pain with lateral compression of the calcaneus or pain with vibratory sensation. There is no pain along the course or insertion of the achilles tendon. No other areas of tenderness to bilateral lower extremities. Muscular strength 5/5 in all groups tested bilateral.  Decreased medial arch height.  Gait: Unassisted, Nonantalgic.       Assessment:   Left heel pain, plantar fasciitis     Plan:  -Treatment options discussed including all alternatives, risks, and complications -Etiology of symptoms were discussed -X-rays were obtained and reviewed with the patient.  Decreased calcaneal inclination angle.  Inferior calcaneal spurs present.  No evidence of acute fracture. -Prescribed mobic. Discussed side effects of the medication and directed to stop if any are to occur and call the office.  -Plantar fascial brace is dispensed -Discussed stretching, icing daily.  Discussed shoe modifications orthotics.  She was doing proceed with orthotics.  She was measured for them today.  Trula Slade DPM

## 2021-10-24 ENCOUNTER — Encounter (INDEPENDENT_AMBULATORY_CARE_PROVIDER_SITE_OTHER): Payer: Self-pay

## 2021-11-17 ENCOUNTER — Other Ambulatory Visit: Payer: Self-pay | Admitting: Podiatry

## 2021-11-18 NOTE — Telephone Encounter (Signed)
Please advise 

## 2021-11-19 ENCOUNTER — Ambulatory Visit (INDEPENDENT_AMBULATORY_CARE_PROVIDER_SITE_OTHER): Payer: BC Managed Care – PPO | Admitting: Adult Health

## 2021-11-19 ENCOUNTER — Encounter (INDEPENDENT_AMBULATORY_CARE_PROVIDER_SITE_OTHER): Payer: Self-pay | Admitting: Adult Health

## 2021-11-19 ENCOUNTER — Other Ambulatory Visit: Payer: Self-pay

## 2021-11-19 VITALS — BP 121/75 | HR 85 | Temp 97.4°F | Ht 63.0 in | Wt 251.0 lb

## 2021-11-19 DIAGNOSIS — F3289 Other specified depressive episodes: Secondary | ICD-10-CM | POA: Diagnosis not present

## 2021-11-19 DIAGNOSIS — R7303 Prediabetes: Secondary | ICD-10-CM | POA: Diagnosis not present

## 2021-11-19 DIAGNOSIS — Z6841 Body Mass Index (BMI) 40.0 and over, adult: Secondary | ICD-10-CM

## 2021-11-19 DIAGNOSIS — Z9189 Other specified personal risk factors, not elsewhere classified: Secondary | ICD-10-CM

## 2021-11-19 DIAGNOSIS — E66813 Obesity, class 3: Secondary | ICD-10-CM

## 2021-11-19 DIAGNOSIS — R002 Palpitations: Secondary | ICD-10-CM | POA: Diagnosis not present

## 2021-11-19 MED ORDER — TIRZEPATIDE 5 MG/0.5ML ~~LOC~~ SOAJ: 5.0000 mg | SUBCUTANEOUS | 0 refills | Status: DC

## 2021-11-19 NOTE — Progress Notes (Signed)
Chief Complaint:   OBESITY Vanessa Copeland is here to discuss her progress with her obesity treatment plan along with follow-up of her obesity related diagnoses. Vanessa Copeland is on the Category 3 Plan and states she is following her eating plan approximately 80% of the time. Vanessa Copeland states she is walking for 25 minutes 2 times per week.  Today's visit was #: 73 Starting weight: 256 lbs Starting date: 09/06/2019 Today's weight: 251 lbs Today's date: 11/19/2021 Total lbs lost to date: 5 lbs Total lbs lost since last in-office visit: 1 lb  Interim History: Due to polyphagia with Saxenda - prescription was discontinued and was started on Mounjaro 5 mg on 10/21/2021.   Injects on Tuesdays. She reports decreased hunger levels and denies mass in neck, dysphagia, dyspepsia, persistent hoarseness.  She consumed 32 ounce Diet Pepsi daily with very little water intake.  Subjective:   1. Pre-diabetes On 08/29/2021, A1c was 6.0 - she was on Saxenda at the time of the lab draw. Due to polyphagia with Saxenda - prescription was discontinued and started on Mounjaro 5 mg on 10/21/2021.   Injects on Tuesdays. She has had 5 doses at this strength.   She reports decreased hunger levels and denies mass in neck, dysphagia, dyspepsia, persistent hoarseness. Mild nausea without vomiting with first 2 injections, GI upset did not occur after last 3 injections.   2. Palpitations Will experience "heart flutter" most evenings (2000/bedtime). She consumes 32 ounces Diet Pepsi with minimal water daily.  History of mitral valve regurgitation (mild) per ECHO on 01/18/2020.    Last OV with cardiology/Dr. Johnsie Cancel 02/21/20- coronary CT scan was normal, ordered Holter Study. Holter study on 04/06/2020- demonstrated SR.  3. Other depression, with emotional eating She reports stable mood, denies SI/HI. On 08/06/2021, she restarted Lexapro 10 mg.  4. At risk for dehydration Vanessa Copeland is at risk for dehydration due to hydrating  with Diet Pepsi and drinking little water daily.  Assessment/Plan:   1. Pre-diabetes Refill Mounjaro 5 mg once weekly. Maintain 5 mg dose due to nausea without vomiting with first 2 injections.  - Refill tirzepatide (MOUNJARO) 5 MG/0.5ML Pen; Inject 5 mg into the skin once a week.  Dispense: 6 mL; Refill: 0  2. Palpitations Decrease Diet Pepsi to 16 ounces per day, increase water intake, monitor for cardiac symptoms. If red flag symptoms develop, seek immediate medical assistance. Pt verbalized understanding and agreement.  3. Other depression, with emotional eating Continue Lexapro 10 mg.  No need for refill.  4. At risk for dehydration Vanessa Copeland was given approximately 15 minutes dehydration prevention counseling today. Vanessa Copeland is at risk for dehydration due to weight loss and current medication(s). She was encouraged to hydrate and monitor fluid status to avoid dehydration as well as weight loss plateaus.   5. Obesity with current BMI of 44.5  Vanessa Copeland is currently in the action stage of change. As such, her goal is to continue with weight loss efforts. She has agreed to the Category 3 Plan.   Decrease Diet Pepsi to 16 ounces per day, increase water intake.  Exercise goals:  As is.  Behavioral modification strategies: increasing lean protein intake, decreasing simple carbohydrates, increasing water intake, meal planning and cooking strategies, keeping healthy foods in the home, and planning for success.  Vanessa Copeland has agreed to follow-up with our clinic in 4 weeks. She was informed of the importance of frequent follow-up visits to maximize her success with intensive lifestyle modifications for her multiple health conditions.  Objective:   Blood pressure 121/75, pulse 85, temperature (!) 97.4 F (36.3 C), height 5\' 3"  (1.6 m), weight 251 lb (113.9 kg), SpO2 100 %. Body mass index is 44.46 kg/m.  General: Cooperative, alert, well developed, in no acute distress. HEENT: Conjunctivae  and lids unremarkable. Cardiovascular: Regular rhythm.  Lungs: Normal work of breathing. Neurologic: No focal deficits.   Lab Results  Component Value Date   CREATININE 0.76 08/29/2021   BUN 11 08/29/2021   NA 143 08/29/2021   K 4.5 08/29/2021   CL 104 08/29/2021   CO2 27 08/29/2021   Lab Results  Component Value Date   ALT 16 08/29/2021   AST 16 08/29/2021   ALKPHOS 86 08/29/2021   BILITOT 0.6 08/29/2021   Lab Results  Component Value Date   HGBA1C 6.0 (H) 08/29/2021   HGBA1C 5.6 11/27/2020   HGBA1C 5.8 (H) 07/24/2020   HGBA1C 5.4 03/28/2020   HGBA1C 5.1 01/02/2020   Lab Results  Component Value Date   INSULIN 19.4 08/29/2021   INSULIN 3.8 11/27/2020   INSULIN 8.4 07/24/2020   INSULIN 6.6 03/28/2020   INSULIN 14.6 09/06/2019   Lab Results  Component Value Date   TSH 2.000 03/28/2020   Lab Results  Component Value Date   CHOL 174 08/29/2021   HDL 43 08/29/2021   LDLCALC 118 (H) 08/29/2021   TRIG 69 08/29/2021   CHOLHDL 4.0 08/29/2021   Lab Results  Component Value Date   VD25OH 34.4 08/29/2021   VD25OH 36.8 11/27/2020   VD25OH 31.6 07/24/2020   Lab Results  Component Value Date   WBC 7.9 03/29/2020   HGB 11.4 03/29/2020   HCT 34.6 03/29/2020   MCV 88 03/29/2020   PLT 169 03/29/2020   Lab Results  Component Value Date   IRON 57 12/27/2019   TIBC 321 12/27/2019   FERRITIN 51 12/27/2019   Attestation Statements:   Reviewed by clinician on day of visit: allergies, medications, problem list, medical history, surgical history, family history, social history, and previous encounter notes.  I, Water quality scientist, CMA, am acting as Location manager for Mina Marble, NP.  I have reviewed the above documentation for accuracy and completeness, and I agree with the above. -  Khale Nigh d. Timberlee Roblero, NP-C

## 2021-11-28 ENCOUNTER — Other Ambulatory Visit: Payer: Self-pay

## 2021-11-28 ENCOUNTER — Ambulatory Visit: Payer: BC Managed Care – PPO

## 2021-11-28 DIAGNOSIS — M722 Plantar fascial fibromatosis: Secondary | ICD-10-CM

## 2021-11-28 DIAGNOSIS — M7732 Calcaneal spur, left foot: Secondary | ICD-10-CM

## 2021-11-28 NOTE — Progress Notes (Signed)
SITUATION: Reason for Visit: Fitting and Delivery of Custom Fabricated Foot Orthoses Patient Report: Patient reports comfort and is satisfied with device.  OBJECTIVE DATA: Patient History / Diagnosis:  No change in pathology Provided Device:  Custom functional foot orthoses  GOAL OF ORTHOSIS - Improve gait - Decrease energy expenditure - Improve Balance - Provide Triplanar stability of foot complex - Facilitate motion  ACTIONS PERFORMED Patient was fit with foot orthoses. Patient tolerated fittign procedure. Patient did not present with appropriate shoe wear. Instructed patient in appropriate shoes.   Patient was provided with verbal and written instruction and demonstration regarding donning, doffing, wear, care, proper fit, function, purpose, cleaning, and use of the orthosis and in all related precautions and risks and benefits regarding the orthosis.  Patient was also provided with verbal instruction regarding how to report any failures or malfunctions of the orthosis and necessary follow up care. Patient was also instructed to contact our office regarding any change in status that may affect the function of the orthosis.  Patient demonstrated independence with proper donning, doffing, and fit and verbalized understanding of all instructions.  PLAN: Patient is to follow up in one week or as necessary (PRN). All questions were answered and concerns addressed. Plan of care was discussed with and agreed upon by the patient.

## 2021-12-25 ENCOUNTER — Ambulatory Visit (INDEPENDENT_AMBULATORY_CARE_PROVIDER_SITE_OTHER): Payer: BC Managed Care – PPO | Admitting: Adult Health

## 2021-12-25 ENCOUNTER — Other Ambulatory Visit: Payer: Self-pay

## 2021-12-25 ENCOUNTER — Other Ambulatory Visit: Payer: Self-pay | Admitting: Family Medicine

## 2021-12-25 ENCOUNTER — Encounter (INDEPENDENT_AMBULATORY_CARE_PROVIDER_SITE_OTHER): Payer: Self-pay | Admitting: Adult Health

## 2021-12-25 VITALS — BP 117/78 | HR 85 | Temp 97.8°F | Ht 63.0 in | Wt 252.0 lb

## 2021-12-25 DIAGNOSIS — Z6841 Body Mass Index (BMI) 40.0 and over, adult: Secondary | ICD-10-CM

## 2021-12-25 DIAGNOSIS — Z9189 Other specified personal risk factors, not elsewhere classified: Secondary | ICD-10-CM | POA: Diagnosis not present

## 2021-12-25 DIAGNOSIS — R7303 Prediabetes: Secondary | ICD-10-CM

## 2021-12-25 DIAGNOSIS — F3289 Other specified depressive episodes: Secondary | ICD-10-CM

## 2021-12-25 MED ORDER — TIRZEPATIDE 5 MG/0.5ML ~~LOC~~ SOAJ: 5.0000 mg | SUBCUTANEOUS | 0 refills | Status: DC

## 2021-12-25 NOTE — Telephone Encounter (Signed)
[facility] is requesting to fill pt ibuprofen. Please advise KH 

## 2021-12-25 NOTE — Progress Notes (Signed)
Chief Complaint:   OBESITY Vanessa Copeland is here to discuss her progress with her obesity treatment plan along with follow-up of her obesity related diagnoses. Vanessa Copeland is on the Category 3 Plan and states she is following her eating plan approximately 80% of the time. Vanessa Copeland states she is walking for 25 minutes 2 times per week.  Today's visit was #: 49 Starting weight: 256 lbs Starting date: 09/06/2019 Today's weight: 252 lbs Today's date: 12/25/2021 Total lbs lost to date: 4 lbs Total lbs lost since last in-office visit: 0  Interim History:  Vanessa Copeland says that Saxenda was ineffective.   GLP- 1 therapy was replaced with Mounjaro 5 mg on 10/21/2021 - been on this strength for 2 months.  Typical snacks - almonds, Skinny Pop popcorn, 2 small pieces of individual chocolate bars.  Subjective:   1. Pre-diabetes Vanessa Copeland says that Saxenda was ineffective.   GLP- 1 therapy was replaced with Mounjaro 5 mg on 10/21/2021 - been on this strength for 2 months. She denies mass in neck, dysphagia, dyspepsia, persistent hoarseness, or GI upset. She denies polyphagia at present.  2. Other depression, with emotional eating She missed several days of Lexapro 10 mg - irritability increased. She denies SI/HI.  3. At risk for anxiety Vanessa Copeland is at risk of developing anxiety due to missed Lexapro 10 mg doses.  Assessment/Plan:   1. Pre-diabetes Refill Mounjaro 5 mg once weekly. Consider increasing dose if polyphagia develops.  - Refill tirzepatide (MOUNJARO) 5 MG/0.5ML Pen; Inject 5 mg into the skin once a week.  Dispense: 6 mL; Refill: 0  2. Other depression, with emotional eating Consistently take Lexapro 10 mg daily.  Discuss dose adjustment in the future.  3. At risk for anxiety Vanessa Copeland was given approximately 15 minutes of anxiety risk counseling today. She has risk factors for anxiety. We discussed the importance of a healthy work life balance, a healthy relationship with food and a good support  system.  Repetitive spaced learning was employed today to elicit superior memory formation and behavioral change.  4. Obesity with current BMI of 44.7  Vanessa Copeland is currently in the action stage of change. As such, her goal is to continue with weight loss efforts. She has agreed to the Category 3 Plan.   Check fasting labs at next office visit.  Exercise goals:  As is.  Behavioral modification strategies: increasing lean protein intake, decreasing simple carbohydrates, meal planning and cooking strategies, keeping healthy foods in the home, and planning for success.  Vanessa Copeland has agreed to follow-up with our clinic in 3 weeks, fasting. She was informed of the importance of frequent follow-up visits to maximize her success with intensive lifestyle modifications for her multiple health conditions.   Objective:   Blood pressure 117/78, pulse 85, temperature 97.8 F (36.6 C), height 5\' 3"  (1.6 m), weight 252 lb (114.3 kg), SpO2 99 %. Body mass index is 44.64 kg/m.  General: Cooperative, alert, well developed, in no acute distress. HEENT: Conjunctivae and lids unremarkable. Cardiovascular: Regular rhythm.  Lungs: Normal work of breathing. Neurologic: No focal deficits.   Lab Results  Component Value Date   CREATININE 0.76 08/29/2021   BUN 11 08/29/2021   NA 143 08/29/2021   K 4.5 08/29/2021   CL 104 08/29/2021   CO2 27 08/29/2021   Lab Results  Component Value Date   ALT 16 08/29/2021   AST 16 08/29/2021   ALKPHOS 86 08/29/2021   BILITOT 0.6 08/29/2021   Lab Results  Component  Value Date   HGBA1C 6.0 (H) 08/29/2021   HGBA1C 5.6 11/27/2020   HGBA1C 5.8 (H) 07/24/2020   HGBA1C 5.4 03/28/2020   HGBA1C 5.1 01/02/2020   Lab Results  Component Value Date   INSULIN 19.4 08/29/2021   INSULIN 3.8 11/27/2020   INSULIN 8.4 07/24/2020   INSULIN 6.6 03/28/2020   INSULIN 14.6 09/06/2019   Lab Results  Component Value Date   TSH 2.000 03/28/2020   Lab Results  Component Value  Date   CHOL 174 08/29/2021   HDL 43 08/29/2021   LDLCALC 118 (H) 08/29/2021   TRIG 69 08/29/2021   CHOLHDL 4.0 08/29/2021   Lab Results  Component Value Date   VD25OH 34.4 08/29/2021   VD25OH 36.8 11/27/2020   VD25OH 31.6 07/24/2020   Lab Results  Component Value Date   WBC 7.9 03/29/2020   HGB 11.4 03/29/2020   HCT 34.6 03/29/2020   MCV 88 03/29/2020   PLT 169 03/29/2020   Lab Results  Component Value Date   IRON 57 12/27/2019   TIBC 321 12/27/2019   FERRITIN 51 12/27/2019   Attestation Statements:   Reviewed by clinician on day of visit: allergies, medications, problem list, medical history, surgical history, family history, social history, and previous encounter notes.  I, Water quality scientist, CMA, am acting as Location manager for Mina Marble, NP.  I have reviewed the above documentation for accuracy and completeness, and I agree with the above. -  Eller Sweis d. Mekesha Solomon, NP-C

## 2021-12-27 ENCOUNTER — Emergency Department (HOSPITAL_BASED_OUTPATIENT_CLINIC_OR_DEPARTMENT_OTHER)
Admission: EM | Admit: 2021-12-27 | Discharge: 2021-12-27 | Disposition: A | Discharge status: Home / Self Care | Payer: BC Managed Care – PPO | Attending: Emergency Medicine | Admitting: Emergency Medicine

## 2021-12-27 ENCOUNTER — Other Ambulatory Visit: Payer: Self-pay

## 2021-12-27 ENCOUNTER — Emergency Department (HOSPITAL_BASED_OUTPATIENT_CLINIC_OR_DEPARTMENT_OTHER): Payer: BC Managed Care – PPO

## 2021-12-27 DIAGNOSIS — N200 Calculus of kidney: Secondary | ICD-10-CM | POA: Insufficient documentation

## 2021-12-27 DIAGNOSIS — R319 Hematuria, unspecified: Secondary | ICD-10-CM | POA: Diagnosis present

## 2021-12-27 LAB — URINALYSIS, ROUTINE W REFLEX MICROSCOPIC
Bilirubin Urine: NEGATIVE
Glucose, UA: NEGATIVE mg/dL
Nitrite: NEGATIVE
Protein, ur: 100 mg/dL — AB
RBC / HPF: >50 RBC/hpf — ABNORMAL HIGH (ref 0–5)
Specific Gravity, Urine: 1.026 (ref 1.005–1.030)
pH: 5.5 (ref 5.0–8.0)

## 2021-12-27 LAB — CBC
HCT: 33.3 % — ABNORMAL LOW (ref 36.0–46.0)
Hemoglobin: 10.8 g/dL — ABNORMAL LOW (ref 12.0–15.0)
MCH: 28.6 pg (ref 26.0–34.0)
MCHC: 32.4 g/dL (ref 30.0–36.0)
MCV: 88.1 fL (ref 80.0–100.0)
Platelets: 391 10*3/uL (ref 150–400)
RBC: 3.78 MIL/uL — ABNORMAL LOW (ref 3.87–5.11)
RDW: 15.9 % — ABNORMAL HIGH (ref 11.5–15.5)
WBC: 9.6 10*3/uL (ref 4.0–10.5)
nRBC: 0 % (ref 0.0–0.2)

## 2021-12-27 LAB — BASIC METABOLIC PANEL
Anion gap: 8 (ref 5–15)
BUN: 14 mg/dL (ref 6–20)
CO2: 30 mmol/L (ref 22–32)
Calcium: 9.6 mg/dL (ref 8.9–10.3)
Chloride: 102 mmol/L (ref 98–111)
Creatinine, Ser: 0.69 mg/dL (ref 0.44–1.00)
GFR, Estimated: >60 mL/min (ref >60)
Glucose, Bld: 84 mg/dL (ref 70–99)
Potassium: 3.7 mmol/L (ref 3.5–5.1)
Sodium: 140 mmol/L (ref 135–145)

## 2021-12-27 LAB — PREGNANCY, URINE: Preg Test, Ur: NEGATIVE

## 2021-12-27 NOTE — ED Provider Notes (Signed)
[facility] Provider Note   CSN: [id number] Arrival date & time: 12/27/21  1307     History  Chief Complaint  Patient presents with   Hematuria    Vanessa Copeland is a 56 y.o. female.   Hematuria   Patient has a history of kidney stones, hepatic steatosis and prior lithotripsy as well as tubal ligation and oophorectomy.  Patient states she has been having issues with blood in her urine for the last couple of days.  Today however she noticed significant mount of blood that was increased from the previous days.  She is not having any trouble with fevers or chills.  She does not have any abdominal pain.  She is not having any lightheadedness or dizziness.  Home Medications Prior to Admission medications   Medication Sig Start Date End Date Taking? Authorizing Provider  albuterol (VENTOLIN HFA) 108 (90 Base) MCG/ACT inhaler TAKE 2 PUFFS BY MOUTH EVERY 6 HOURS AS NEEDED FOR WHEEZE OR SHORTNESS OF BREATH 07/13/20   Denita Lung, MD  EPINEPHrine 0.3 mg/0.3 mL IJ SOAJ injection Inject 0.3 mg into the muscle as needed for anaphylaxis. 04/11/21   Henson, Vickie L, PA-C  escitalopram (LEXAPRO) 10 MG tablet Take 1 tablet (10 mg total) by mouth every morning. 10/21/21   Whitmire, Dawn W, FNP  fluticasone (FLONASE) 50 MCG/ACT nasal spray Place 2 sprays into both nostrils daily. 02/14/21   Rita Ohara, MD  ibuprofen (ADVIL) 800 MG tablet TAKE 1 TABLET(800 MG) BY MOUTH EVERY 8 HOURS AS NEEDED FOR JAW PAIN 12/25/21   Denita Lung, MD  levocetirizine (XYZAL) 5 MG tablet Take 1 tablet (5 mg total) by mouth every evening. 07/20/19   Tysinger, Camelia Eng, PA-C  levonorgestrel (MIRENA) 20 MCG/24HR IUD 1 each by Intrauterine route once.    [provider]  omeprazole (PRILOSEC) 40 MG capsule TAKE 1 CAPSULE (40 MG TOTAL) BY MOUTH DAILY. 08/27/21   Henson, Vickie L, PA-C  propranolol (INDERAL) 10 MG tablet Take 1 tablet (10 mg total) by mouth 4 (four) times daily as needed  (palpitations/fast heart rate). 01/19/20   Nahser, Wonda Cheng, MD  tirzepatide [facility]) 5 MG/0.5ML Pen Inject 5 mg into the skin once a week. 12/25/21   Danford, Valetta Fuller D, NP  Vitamin D, Ergocalciferol, (DRISDOL) 1.25 MG (50000 UNIT) CAPS capsule Take 1 capsule (50,000 Units total) by mouth every 7 (seven) days. 10/21/21   Whitmire, Joneen Boers, FNP      Allergies    Shellfish allergy    Review of Systems   Review of Systems  Genitourinary:  Positive for hematuria.   Physical Exam Updated Vital Signs BP 126/71 (BP Location: Right Arm)    Pulse 68    Temp 98.2 F (36.8 C)    Resp 20    SpO2 100%  Physical Exam Vitals and nursing note reviewed.  Constitutional:      General: She is not in acute distress.    Appearance: She is well-developed.  HENT:     Head: Normocephalic and atraumatic.     Right Ear: External ear normal.     Left Ear: External ear normal.  Eyes:     General: No scleral icterus.       Right eye: No discharge.        Left eye: No discharge.     Conjunctiva/sclera: Conjunctivae normal.  Neck:     Trachea: No tracheal deviation.  Cardiovascular:     Rate and Rhythm: Normal  rate and regular rhythm.  Pulmonary:     Effort: Pulmonary effort is normal. No respiratory distress.     Breath sounds: Normal breath sounds. No stridor. No wheezing or rales.  Abdominal:     General: Bowel sounds are normal. There is no distension.     Palpations: Abdomen is soft.     Tenderness: There is no abdominal tenderness. There is no guarding or rebound.  Musculoskeletal:        General: No tenderness or deformity.     Cervical back: Neck supple.  Skin:    General: Skin is warm and dry.     Findings: No rash.  Neurological:     General: No focal deficit present.     Mental Status: She is alert.     Cranial Nerves: No cranial nerve deficit (no facial droop, extraocular movements intact, no slurred speech).     Sensory: No sensory deficit.     Motor: No abnormal muscle tone or seizure  activity.     Coordination: Coordination normal.  Psychiatric:        Mood and Affect: Mood normal.    ED Results / Procedures / Treatments   Labs (all labs ordered are listed, but only abnormal results are displayed) Labs Reviewed  URINALYSIS, ROUTINE W REFLEX MICROSCOPIC - Abnormal; Notable for the following components:      Result Value   Color, Urine BROWN (*)    APPearance CLOUDY (*)    Hgb urine dipstick LARGE (*)    Ketones, ur TRACE (*)    Protein, ur 100 (*)    Leukocytes,Ua SMALL (*)    RBC / HPF >50 (*)    All other components within normal limits  CBC - Abnormal; Notable for the following components:   RBC 3.78 (*)    Hemoglobin 10.8 (*)    HCT 33.3 (*)    RDW 15.9 (*)    All other components within normal limits  PREGNANCY, URINE  BASIC METABOLIC PANEL    EKG None  Radiology CT Renal Stone Study  Result Date: 12/27/2021 CLINICAL DATA:  Flank pain and hematuria.  History of stones EXAM: CT ABDOMEN AND PELVIS WITHOUT CONTRAST TECHNIQUE: Multidetector CT imaging of the abdomen and pelvis was performed following the standard protocol without IV contrast. COMPARISON:  CT 05/21/2018 FINDINGS: Lower chest: Subsegmental atelectasis in the lingula. There is a 4 mm nodule in the lower most aspect of right lower lobe, series 4, image 14. This is unchanged from 2019 exam and considered benign, no further follow-up is needed. No pleural fluid. Borderline cardiomegaly is again seen. Hepatobiliary: No focal liver abnormality on this unenhanced exam. Unremarkable gallbladder without calcified gallstone or pericholecystic fat stranding. There is no biliary dilatation. Pancreas: No ductal dilatation or inflammation. Spleen: Normal in size without focal abnormality. Adrenals/Urinary Tract: Normal adrenal glands. There are 4 nonobstructing stones in the right kidney, largest in the lower pole measures 5 mm. No right hydronephrosis or perinephric edema. Decompressed right ureter without  ureteral stone. There are 3 stones in the left kidney. Largest stone measures 8 mm in the renal pelvis. There is no hydronephrosis or renal collecting system dilatation, no perinephric edema, however this stone may be intermittently obstructing. The left ureter is decompressed without stone along the course. Urinary bladder is nondistended, no bladder stone. No bladder wall thickening. Stomach/Bowel: Unenhanced stomach is unremarkable. No obvious gastric wall thickening or focal abnormality. No small bowel obstruction or inflammation. High-riding cecum in the right mid  abdomen. The appendix is tentatively visualized and normal, regardless, no appendicitis. Small to moderate colonic stool burden. There is mild colonic redundancy. No colonic wall thickening or pericolonic edema. Vascular/Lymphatic: There prominent perigastric lymph nodes adjacent to the distal stomach, largest measuring 7 mm short axis series 2, image 33. These are unchanged from prior exam, presumably reactive. Stability for greater than 3 years is consistent with a benign process. No enlarged lymph nodes in the abdomen and pelvis by size criteria. Normal caliber abdominal aorta. No portal venous or mesenteric gas. Reproductive: Anteverted uterus with IUD appropriately positioned. No adnexal mass. Other: No free air, free fluid, or intra-abdominal fluid collection. Small fat containing umbilical hernia. Musculoskeletal: Mild L5-S1 degenerative disc disease with vacuum phenomenon. There are no acute or suspicious osseous abnormalities. IMPRESSION: 1. Bilateral intrarenal calculi. Largest stone measures 8 mm in the left renal pelvis. No hydronephrosis or perinephric edema, however this stone may be intermittently obstructing. 2. Prominent perigastric lymph nodes are unchanged from prior exam, presumably reactive. Stability for greater than 3 years is consistent with a benign process. Electronically Signed   By: Keith Rake M.D.   On: 12/27/2021  15:12    Procedures Procedures    Medications Ordered in ED Medications - No data to display  ED Course/ Medical Decision Making/ A&P Clinical Course as of 12/27/21 2003  Fri Dec 27, 2021  1657 CT Renal Joaquim Lai Study CT scan shows bilateral renal stones but no ureteral stone, possible that it may be intermittently obstructing.  Images and report reviewed by me [JK]  1657 Urinalysis does show large amount of blood [JK]  1900 CBC shows hemoglobin 10.8.  Slightly decreased from 1 year ago.  Metabolic panel renal function normal [JK]    Clinical Course User Index [JK] Dorie Rank, MD                           Medical Decision Making  Patient presented to the ER for evaluation of hematuria.  Patient's laboratory test showed mild anemia with a hemoglobin of 10.8 but this is not significantly decreased from her baseline.  Patient is overall asymptomatic.  She is not hypotensive.  CT scan does show kidney stones.  This certainly would account for her hematuria but fortunately no signs of ureterolithiasis.  recoomend outpatient follow-up with urology.        Final Clinical Impression(s) / ED Diagnoses Final diagnoses:  Hematuria, unspecified type  Kidney stone    Rx / DC Orders ED Discharge Orders     None         Dorie Rank, MD 12/27/21 2003

## 2021-12-27 NOTE — ED Notes (Signed)
ED Provider at bedside. 

## 2021-12-27 NOTE — ED Triage Notes (Signed)
Patient reports to the ER for hematuria x2 days but today noticed a significant amount of blood. No CVA tenderness on either side. Reports a hx of kidney stones but denies flank or abdominal pain at this time.

## 2021-12-27 NOTE — ED Notes (Signed)
EMT-P provided AVS using Teachback Method. Patient verbalizes understanding of Discharge Instructions. Opportunity for Questioning and Answers were provided by EMT-P. Patient Discharged from ED.  ? ?

## 2021-12-27 NOTE — Discharge Instructions (Addendum)
Follow-up with the urologist to be rechecked.  Follow-up with your primary care doctor regarding your anemia

## 2022-01-05 ENCOUNTER — Other Ambulatory Visit (INDEPENDENT_AMBULATORY_CARE_PROVIDER_SITE_OTHER): Payer: Self-pay | Admitting: Adult Health

## 2022-01-05 DIAGNOSIS — R7303 Prediabetes: Secondary | ICD-10-CM

## 2022-01-16 ENCOUNTER — Ambulatory Visit (INDEPENDENT_AMBULATORY_CARE_PROVIDER_SITE_OTHER): Payer: BC Managed Care – PPO | Admitting: Adult Health

## 2022-01-16 ENCOUNTER — Encounter (INDEPENDENT_AMBULATORY_CARE_PROVIDER_SITE_OTHER): Payer: Self-pay | Admitting: Adult Health

## 2022-01-16 ENCOUNTER — Other Ambulatory Visit: Payer: Self-pay

## 2022-01-16 VITALS — BP 128/76 | HR 68 | Temp 97.5°F | Ht 63.0 in | Wt 249.0 lb

## 2022-01-16 DIAGNOSIS — R3129 Other microscopic hematuria: Secondary | ICD-10-CM

## 2022-01-16 DIAGNOSIS — R319 Hematuria, unspecified: Secondary | ICD-10-CM | POA: Diagnosis not present

## 2022-01-16 DIAGNOSIS — R7303 Prediabetes: Secondary | ICD-10-CM | POA: Diagnosis not present

## 2022-01-16 DIAGNOSIS — Z9189 Other specified personal risk factors, not elsewhere classified: Secondary | ICD-10-CM

## 2022-01-16 DIAGNOSIS — Z6841 Body Mass Index (BMI) 40.0 and over, adult: Secondary | ICD-10-CM

## 2022-01-16 DIAGNOSIS — E559 Vitamin D deficiency, unspecified: Secondary | ICD-10-CM

## 2022-01-16 DIAGNOSIS — E669 Obesity, unspecified: Secondary | ICD-10-CM | POA: Diagnosis not present

## 2022-01-16 HISTORY — DX: Other microscopic hematuria: R31.29

## 2022-01-16 MED ORDER — TIRZEPATIDE 5 MG/0.5ML ~~LOC~~ SOAJ: 5.0000 mg | SUBCUTANEOUS | 0 refills | Status: DC

## 2022-01-16 MED ORDER — VITAMIN D (ERGOCALCIFEROL) 1.25 MG (50000 UNIT) PO CAPS: 50000.0000 [IU] | ORAL_CAPSULE | ORAL | 0 refills | Status: DC

## 2022-01-16 NOTE — Progress Notes (Signed)
Chief Complaint:   OBESITY Vanessa Copeland is here to discuss her progress with her obesity treatment plan along with follow-up of her obesity related diagnoses. Vanessa Copeland is on the Category 3 Plan and states she is following her eating plan approximately 801% of the time. Vanessa Copeland states she is walking for 20 minutes 2 times per week.  Today's visit was #: 47 Starting weight: 256 lbs Starting date: 09/06/2019 Today's weight: 249 lbs Today's date: 01/16/2022 Total lbs lost to date: 7 lbs Total lbs lost since last in-office visit: 3 lbs  Interim History:  Vanessa Copeland was seen in the ED for hematuria on 12/27/2021. CT scan Impression: IMPRESSION:  1. Bilateral intrarenal calculi. Largest stone measures 8 mm in the  left renal pelvis. No hydronephrosis or perinephric edema, however  this stone may be intermittently obstructing.  2. Prominent perigastric lymph nodes are unchanged from prior exam,  presumably reactive. Stability for greater than 3 years is  consistent with a benign process.   Advised to follow-up with Urology.  2023 Health/Wellness Goals: 1) Lose down to 200 pounds, current weight 249 pounds.  Subjective:   1. Pre-diabetes Saxenda replaced with Mounjaro on 10/21/2021. She has been on Mounjaro 5 mg ever since then . She denies mass in neck, dysphagia, dyspepsia, persistent hoarseness, or GI upset.  2. Hematuria, unspecified type She denies acute GI/GU sx's at present. She has an appt with Urology tomorrow.  3. Vitamin D deficiency On 08/29/2021, vitamin D level - 34.4 - well below goal of 50. She is currently taking prescription ergocalciferol 50,000 IU each week. She denies nausea, vomiting or muscle weakness.  4. At risk for osteoporosis Vanessa Copeland is at higher risk of osteopenia and osteoporosis due to Vitamin D deficiency and obesity.   Assessment/Plan:   1. Pre-diabetes Refill Mounjaro 5 mg once weekly. Check labs today.  - Refill tirzepatide (MOUNJARO) 5 MG/0.5ML Pen;  Inject 5 mg into the skin once a week.  Dispense: 6 mL; Refill: 0 - Hemoglobin A1c - Insulin, random  2. Hematuria, unspecified type Remain well hydrated, follow-up with Urology - 01/17/2022.  3. Vitamin D deficiency Check vitamin D level today. Refill ergocalciferol 50,000 IU once weekly.  - Refill Vitamin D, Ergocalciferol, (DRISDOL) 1.25 MG (50000 UNIT) CAPS capsule; Take 1 capsule (50,000 Units total) by mouth every 7 (seven) days.  Dispense: 4 capsule; Refill: 0 - VITAMIN D 25 Hydroxy (Vit-D Deficiency, Fractures)  4. At risk for osteoporosis Vanessa Copeland was given approximately 15 minutes of osteoporosis prevention counseling today. Vanessa Copeland is at risk for osteopenia and osteoporosis due to her Vitamin D deficiency. She was encouraged to take her Vitamin D and follow her higher calcium diet and increase strengthening exercise to help strengthen her bones and decrease her risk of osteopenia and osteoporosis.  Repetitive spaced learning was employed today to elicit superior memory formation and behavioral change.  5. Obesity with current BMI of 44.7  Vanessa Copeland is currently in the action stage of change. As such, her goal is to continue with weight loss efforts. She has agreed to the Category 3 Plan.   Exercise goals:  Increase walking to 25 minutes 2 times per week.  Behavioral modification strategies: increasing lean protein intake, decreasing simple carbohydrates, meal planning and cooking strategies, keeping healthy foods in the home, and planning for success.  Vanessa Copeland has agreed to follow-up with our clinic in 3 weeks. She was informed of the importance of frequent follow-up visits to maximize her success with intensive lifestyle modifications  for her multiple health conditions.   Objective:   Blood pressure 128/76, pulse 68, temperature (!) 97.5 F (36.4 C), height 5\' 3"  (1.6 m), weight 249 lb (112.9 kg), SpO2 97 %. Body mass index is 44.11 kg/m.  General: Cooperative, alert, well  developed, in no acute distress. HEENT: Conjunctivae and lids unremarkable. Cardiovascular: Regular rhythm.  Lungs: Normal work of breathing. Neurologic: No focal deficits.   Lab Results  Component Value Date   CREATININE 0.69 12/27/2021   BUN 14 12/27/2021   NA 140 12/27/2021   K 3.7 12/27/2021   CL 102 12/27/2021   CO2 30 12/27/2021   Lab Results  Component Value Date   ALT 16 08/29/2021   AST 16 08/29/2021   ALKPHOS 86 08/29/2021   BILITOT 0.6 08/29/2021   Lab Results  Component Value Date   HGBA1C 6.0 (H) 08/29/2021   HGBA1C 5.6 11/27/2020   HGBA1C 5.8 (H) 07/24/2020   HGBA1C 5.4 03/28/2020   HGBA1C 5.1 01/02/2020   Lab Results  Component Value Date   INSULIN 19.4 08/29/2021   INSULIN 3.8 11/27/2020   INSULIN 8.4 07/24/2020   INSULIN 6.6 03/28/2020   INSULIN 14.6 09/06/2019   Lab Results  Component Value Date   TSH 2.000 03/28/2020   Lab Results  Component Value Date   CHOL 174 08/29/2021   HDL 43 08/29/2021   LDLCALC 118 (H) 08/29/2021   TRIG 69 08/29/2021   CHOLHDL 4.0 08/29/2021   Lab Results  Component Value Date   VD25OH 34.4 08/29/2021   VD25OH 36.8 11/27/2020   VD25OH 31.6 07/24/2020   Lab Results  Component Value Date   WBC 9.6 12/27/2021   HGB 10.8 (L) 12/27/2021   HCT 33.3 (L) 12/27/2021   MCV 88.1 12/27/2021   PLT 391 12/27/2021   Lab Results  Component Value Date   IRON 57 12/27/2019   TIBC 321 12/27/2019   FERRITIN 51 12/27/2019   Attestation Statements:   Reviewed by clinician on day of visit: allergies, medications, problem list, medical history, surgical history, family history, social history, and previous encounter notes.  I, Water quality scientist, CMA, am acting as Location manager for Mina Marble, NP.  I have reviewed the above documentation for accuracy and completeness, and I agree with the above. -  Amna Welker d. Yanilen Adamik, NP-C

## 2022-01-17 LAB — HEMOGLOBIN A1C
Est. average glucose Bld gHb Est-mCnc: 117 mg/dL
Hgb A1c MFr Bld: 5.7 % — ABNORMAL HIGH (ref 4.8–5.6)

## 2022-01-17 LAB — VITAMIN D 25 HYDROXY (VIT D DEFICIENCY, FRACTURES): Vit D, 25-Hydroxy: 35.6 ng/mL (ref 30.0–100.0)

## 2022-01-17 LAB — INSULIN, RANDOM: INSULIN: 11.6 u[IU]/mL (ref 2.6–24.9)

## 2022-01-21 ENCOUNTER — Encounter: Payer: Self-pay | Admitting: Podiatrist

## 2022-01-21 ENCOUNTER — Other Ambulatory Visit: Payer: Self-pay

## 2022-01-21 ENCOUNTER — Ambulatory Visit: Payer: BC Managed Care – PPO | Admitting: Podiatrist

## 2022-01-21 DIAGNOSIS — M722 Plantar fascial fibromatosis: Secondary | ICD-10-CM

## 2022-01-21 DIAGNOSIS — M79672 Pain in left foot: Secondary | ICD-10-CM | POA: Diagnosis not present

## 2022-01-21 MED ORDER — TRIAMCINOLONE ACETONIDE 10 MG/ML IJ SUSP
10.0000 mg | Freq: Once | INTRAMUSCULAR | Status: AC
  Administered 2022-01-21: 10 mg

## 2022-01-21 NOTE — Progress Notes (Signed)
Chief Complaint  Patient presents with   Plantar Fasciitis    Follow up left heel   "The heel has been fine up until 2 weeks ago. I'm still taking the meloxicam"     HPI: Patient is 56 y.o. female who presents today for follow-up of left heel pain.  The patient states that she received an injection from Dr. Earleen Newport and she was fine up until about 2 weeks ago she relates some improvement with the meloxicam.  She also has received her orthotics and states that they are also helping.   Allergies  Allergen Reactions   Shellfish Allergy Shortness Of Breath    Review of systems is negative except as noted in the HPI.  Denies nausea/ vomiting/ fevers/ chills or night sweats.   Denies difficulty breathing, denies calf pain or tenderness  Physical Exam  Patient is awake, alert, and oriented x 3.  In no acute distress.    Vascular status is intact with palpable pedal pulses DP and PT bilateral and capillary refill time less than 3 seconds bilateral.  No edema or erythema noted.   Neurological exam reveals epicritic and protective sensation grossly intact bilateral.   Dermatological exam reveals skin is supple and dry to bilateral feet.  No open lesions present.    Musculoskeletal exam: Musculature intact with dorsiflexion, plantarflexion, inversion, eversion.  Pes planus deformity is noted bilateral.  Pain on palpation plantar medial aspect of the left heel consistent with plantar fasciitis is elicited.   Assessment:   ICD-10-CM   1. Plantar fasciitis  M72.2 triamcinolone acetonide (KENALOG) 10 MG/ML injection 10 mg    2. Pain of left heel  M79.672 triamcinolone acetonide (KENALOG) 10 MG/ML injection 10 mg       Plan: The patient is requesting an injection and I did agree I prepped the skin with alcohol and infiltrated 10 mg of Kenalog with Marcaine plain into the insertion of the plantar fascial on the left heel.  The patient tolerated the procedure well and was given instructions for  aftercare.  She is instructed for ice and stretching exercises.  A boot was also dispensed for her use.  She will be seen back for recheck in 4 to 6 weeks or sooner if any concerns arise prior to that visit.

## 2022-01-21 NOTE — Patient Instructions (Signed)

## 2022-02-03 ENCOUNTER — Other Ambulatory Visit: Payer: Self-pay | Admitting: Physician Assistant

## 2022-02-03 ENCOUNTER — Telehealth: Payer: Self-pay

## 2022-02-03 DIAGNOSIS — Z91013 Allergy to seafood: Secondary | ICD-10-CM

## 2022-02-03 DIAGNOSIS — J4520 Mild intermittent asthma, uncomplicated: Secondary | ICD-10-CM

## 2022-02-03 MED ORDER — ALBUTEROL SULFATE HFA 108 (90 BASE) MCG/ACT IN AERS: 2.0000 | INHALATION_SPRAY | Freq: Four times a day (QID) | RESPIRATORY_TRACT | 1 refills | Status: DC | PRN

## 2022-02-03 MED ORDER — EPINEPHRINE 0.3 MG/0.3ML IJ SOAJ: 0.3000 mg | INTRAMUSCULAR | 0 refills | Status: DC | PRN

## 2022-02-03 NOTE — Telephone Encounter (Signed)
Pt. Called wanting to know if she could get a refill on her EPI pen and albuterol inhaler. Her epi pen has expired to [facility] in [place] pt. Last apt was 08/01/21.

## 2022-02-03 NOTE — Telephone Encounter (Signed)
Both medications refilled. Thanks.

## 2022-02-04 ENCOUNTER — Other Ambulatory Visit (INDEPENDENT_AMBULATORY_CARE_PROVIDER_SITE_OTHER): Payer: Self-pay | Admitting: Adult Health

## 2022-02-04 DIAGNOSIS — R7303 Prediabetes: Secondary | ICD-10-CM

## 2022-02-05 NOTE — Telephone Encounter (Signed)
Needs PA 

## 2022-02-06 ENCOUNTER — Ambulatory Visit (INDEPENDENT_AMBULATORY_CARE_PROVIDER_SITE_OTHER): Payer: BC Managed Care – PPO | Admitting: Adult Health

## 2022-02-06 ENCOUNTER — Encounter (INDEPENDENT_AMBULATORY_CARE_PROVIDER_SITE_OTHER): Payer: Self-pay

## 2022-02-06 NOTE — Telephone Encounter (Signed)
Prior authorization has been started for [facility]. Will notify patient and provider once response is received by provider.

## 2022-02-12 ENCOUNTER — Ambulatory Visit (INDEPENDENT_AMBULATORY_CARE_PROVIDER_SITE_OTHER): Payer: BC Managed Care – PPO | Admitting: Adult Health

## 2022-02-13 ENCOUNTER — Other Ambulatory Visit: Payer: Self-pay

## 2022-02-13 ENCOUNTER — Ambulatory Visit: Payer: BC Managed Care – PPO | Admitting: Podiatry

## 2022-02-13 DIAGNOSIS — M722 Plantar fascial fibromatosis: Secondary | ICD-10-CM

## 2022-02-13 MED ORDER — TRIAMCINOLONE ACETONIDE 10 MG/ML IJ SUSP: 10.0000 mg | Freq: Once | INTRAMUSCULAR | Status: DC

## 2022-02-13 NOTE — Patient Instructions (Signed)

## 2022-02-18 ENCOUNTER — Ambulatory Visit (INDEPENDENT_AMBULATORY_CARE_PROVIDER_SITE_OTHER): Payer: BC Managed Care – PPO | Admitting: Adult Health

## 2022-02-18 ENCOUNTER — Other Ambulatory Visit: Payer: Self-pay

## 2022-02-18 ENCOUNTER — Encounter (INDEPENDENT_AMBULATORY_CARE_PROVIDER_SITE_OTHER): Payer: Self-pay | Admitting: Adult Health

## 2022-02-18 VITALS — BP 133/81 | HR 67 | Temp 98.2°F | Ht 63.0 in | Wt 249.0 lb

## 2022-02-18 DIAGNOSIS — Z9189 Other specified personal risk factors, not elsewhere classified: Secondary | ICD-10-CM | POA: Diagnosis not present

## 2022-02-18 DIAGNOSIS — E669 Obesity, unspecified: Secondary | ICD-10-CM

## 2022-02-18 DIAGNOSIS — M722 Plantar fascial fibromatosis: Secondary | ICD-10-CM | POA: Insufficient documentation

## 2022-02-18 DIAGNOSIS — Z6841 Body Mass Index (BMI) 40.0 and over, adult: Secondary | ICD-10-CM | POA: Diagnosis not present

## 2022-02-18 DIAGNOSIS — E559 Vitamin D deficiency, unspecified: Secondary | ICD-10-CM | POA: Diagnosis not present

## 2022-02-18 DIAGNOSIS — R7303 Prediabetes: Secondary | ICD-10-CM

## 2022-02-18 MED ORDER — VITAMIN D (ERGOCALCIFEROL) 1.25 MG (50000 UNIT) PO CAPS: 50000.0000 [IU] | ORAL_CAPSULE | ORAL | 0 refills | Status: DC

## 2022-02-18 MED ORDER — SEMAGLUTIDE-WEIGHT MANAGEMENT 1 MG/0.5ML ~~LOC~~ SOAJ: 1.0000 mg | SUBCUTANEOUS | 0 refills | Status: DC

## 2022-02-18 NOTE — Progress Notes (Signed)
Chief Complaint:   OBESITY Irish is here to discuss her progress with her obesity treatment plan along with follow-up of her obesity related diagnoses. Abbegale is on the Category 3 Plan and states she is following her eating plan approximately 80% of the time. Eldine states she is walking for 25 minutes 2 times per week.  Today's visit was #: 33 Starting weight: 256 lbs Starting date: 09/06/2019 Today's weight: 249 lbs Today's date: 02/18/2022 Total lbs lost to date: 7 lbs Total lbs lost since last in-office visit: 0  Interim History:  On 10/21/2021, Saxenda converted to [facility] due to poor results on GLP- therapy. She has been on Mounjaro 5 mg once weekly injection for 4 months. pset.  On 01/29/2022, [facility] - bilateral stents placed, have both been since removed.  Subjective:   1. Vitamin D deficiency Discussed labs with patient today.  On 01/16/2022, vitamin D level - 35.6 - below goal of 50-70. She is currently taking prescription ergocalciferol 50,000 IU each week. She denies nausea, vomiting or muscle weakness.  2. Pre-diabetes Discussed labs with patient today.  On 01/16/2022, A1c 5.7.  Insulin level - 11.6 - both above goal. On 10/21/2021, Saxenda converted to [facility] due to poor results on GLP- therapy. She has been on Mounjaro 5 mg once weekly injection for 4 months. Last injection was yesterday- denies mass in neck, dysphagia, dyspepsia, persistent hoarseness, abd pain, or GI u Her insurance will not continue to cover [facility]- she is not diabetic. Recommend converting back to GLP-1 therapy.  3. At risk for osteoporosis Silena is at higher risk of osteopenia and osteoporosis due to Vitamin D deficiency and obesity.   Assessment/Plan:   1. Vitamin D deficiency Refill ergocalciferol 50,000 IU once weekly.  - Refill Vitamin D, Ergocalciferol, (DRISDOL) 1.25 MG (50000 UNIT) CAPS capsule; Take 1 capsule (50,000 Units total) by mouth every 7 (seven) days.   Dispense: 4 capsule; Refill: 0  2. Pre-diabetes Stop Mounjaro and start Wegovy.  - Start Semaglutide-Weight Management 1 MG/0.5ML SOAJ; Inject 1 mg into the skin once a week for 28 days.  Dispense: 2 mL; Refill: 0  If her insurance will not cover Wegovy, then send in [place] 3mg  Rx.  3. At risk for osteoporosis Tiane was given approximately 15 minutes of osteoporosis prevention counseling today. Onie is at risk for osteopenia and osteoporosis due to her Vitamin D deficiency. She was encouraged to take her Vit D and follow her higher calcium diet and increase strengthening exercise to help strengthen her bones and decrease her risk of osteopenia and osteoporosis.  4. Obesity with current BMI of 44.1  Blyss is currently in the action stage of change. As such, her goal is to continue with weight loss efforts. She has agreed to the Category 3 Plan.   Stop Mounjaro.  Start semaglutide 1 mg once weekly.  [place].  Exercise goals:  As is.  Behavioral modification strategies: increasing lean protein intake, decreasing simple carbohydrates, increasing water intake, meal planning and cooking strategies, keeping healthy foods in the home, and planning for success.  Saraiah has agreed to follow-up with our clinic in 3 weeks. She was informed of the importance of frequent follow-up visits to maximize her success with intensive lifestyle modifications for her multiple health conditions.   Objective:   Blood pressure 133/81, pulse 67, temperature 98.2 F (36.8 C), height 5\' 3"  (1.6 m), weight 249 lb (112.9 kg), SpO2 100 %. Body mass index is 44.[place]  kg/m.  General: Cooperative, alert, well developed, in no acute distress. HEENT: Conjunctivae and lids unremarkable. Cardiovascular: Regular rhythm.  Lungs: Normal work of breathing. Neurologic: No focal deficits.   Lab Results  Component Value Date   CREATININE 0.69 12/27/2021   BUN 14 12/27/2021   NA 140 12/27/2021   K 3.7  12/27/2021   CL 102 12/27/2021   CO2 30 12/27/2021   Lab Results  Component Value Date   ALT 16 08/29/2021   AST 16 08/29/2021   ALKPHOS 86 08/29/2021   BILITOT 0.6 08/29/2021   Lab Results  Component Value Date   HGBA1C 5.7 (H) 01/16/2022   HGBA1C 6.0 (H) 08/29/2021   HGBA1C 5.6 11/27/2020   HGBA1C 5.8 (H) 07/24/2020   HGBA1C 5.4 03/28/2020   Lab Results  Component Value Date   INSULIN 11.6 01/16/2022   INSULIN 19.4 08/29/2021   INSULIN 3.8 11/27/2020   INSULIN 8.4 07/24/2020   INSULIN 6.6 03/28/2020   Lab Results  Component Value Date   TSH 2.000 03/28/2020   Lab Results  Component Value Date   CHOL 174 08/29/2021   HDL 43 08/29/2021   LDLCALC 118 (H) 08/29/2021   TRIG 69 08/29/2021   CHOLHDL 4.0 08/29/2021   Lab Results  Component Value Date   VD25OH 35.6 01/16/2022   VD25OH 34.4 08/29/2021   VD25OH 36.8 11/27/2020   Lab Results  Component Value Date   WBC 9.6 12/27/2021   HGB 10.8 (L) 12/27/2021   HCT 33.3 (L) 12/27/2021   MCV 88.1 12/27/2021   PLT 391 12/27/2021   Lab Results  Component Value Date   IRON 57 12/27/2019   TIBC 321 12/27/2019   FERRITIN 51 12/27/2019   Attestation Statements:   Reviewed by clinician on day of visit: allergies, medications, problem list, medical history, surgical history, family history, social history, and previous encounter notes.  I, Water quality scientist, CMA, am acting as Location manager for Mina Marble, NP.  I have reviewed the above documentation for accuracy and completeness, and I agree with the above. -  Machaela Caterino d. Cloyde Oregel, NP-C

## 2022-02-18 NOTE — Progress Notes (Signed)
Subjective: 56 year old female presents the office today for concerns of continued pain along the left heel, plantar fascia.  She states that she is having discomfort in the last injection was not very helpful.  She has a sharp pain along the heel and it hurts with putting pressure on it at times.  She has been stretching as well as taking anti-inflammatories.  No recent injury or changes otherwise.  Objective: AAO x3, NAD DP/PT pulses palpable bilaterally, CRT less than 3 seconds There is continuation of tenderness palpation along the plantar medial tubercle of the calcaneus at the insertion of plantar fascia on the left side.  Plantar fascia peers to be intact.  There is no pain with lateral compression of calcaneus.  No pain with Achilles tendon.  Negative Tinel sign.  No pain with calf compression, swelling, warmth, erythema  Assessment: Left foot plantar fasciitis  Plan: -All treatment options discussed with the patient including all alternatives, risks, complications.  -Repeat steroid injection performed after discussing risks of repeat injection.  See procedure note below.  Cam boot for immobilization. -Continue stretching, icing daily.  She can transition to regular shoe as tolerated.  Consider physical therapy or if symptoms continue MRI. -Patient encouraged to call the office with any questions, concerns, change in symptoms.   Procedure: Injection Tendon/Ligament Discussed alternatives, risks, complications and verbal consent was obtained.  Location: Left plantar fascia at the glabrous junction; medial approach. Skin Prep: Alcohol. Injectate: 0.5cc 0.5% marcaine plain, 0.5 cc 2% lidocaine plain and, 1 cc kenalog 10. Disposition: Patient tolerated procedure well. Injection site dressed with a band-aid.  Post-injection care was discussed and return precautions discussed.    Trula Slade DPM

## 2022-02-19 ENCOUNTER — Encounter (INDEPENDENT_AMBULATORY_CARE_PROVIDER_SITE_OTHER): Payer: Self-pay

## 2022-03-11 ENCOUNTER — Encounter (INDEPENDENT_AMBULATORY_CARE_PROVIDER_SITE_OTHER): Payer: Self-pay | Admitting: Adult Health

## 2022-03-11 ENCOUNTER — Other Ambulatory Visit: Payer: Self-pay

## 2022-03-11 ENCOUNTER — Ambulatory Visit (INDEPENDENT_AMBULATORY_CARE_PROVIDER_SITE_OTHER): Payer: BC Managed Care – PPO | Admitting: Adult Health

## 2022-03-11 VITALS — BP 122/80 | HR 66 | Temp 97.3°F | Ht 63.0 in | Wt 249.0 lb

## 2022-03-11 DIAGNOSIS — E559 Vitamin D deficiency, unspecified: Secondary | ICD-10-CM

## 2022-03-11 DIAGNOSIS — R7303 Prediabetes: Secondary | ICD-10-CM | POA: Diagnosis not present

## 2022-03-11 DIAGNOSIS — E669 Obesity, unspecified: Secondary | ICD-10-CM

## 2022-03-11 DIAGNOSIS — K5903 Drug induced constipation: Secondary | ICD-10-CM | POA: Diagnosis not present

## 2022-03-11 DIAGNOSIS — Z6841 Body Mass Index (BMI) 40.0 and over, adult: Secondary | ICD-10-CM

## 2022-03-11 MED ORDER — VITAMIN D (ERGOCALCIFEROL) 1.25 MG (50000 UNIT) PO CAPS: 50000.0000 [IU] | ORAL_CAPSULE | ORAL | 0 refills | Status: DC

## 2022-03-11 MED ORDER — SEMAGLUTIDE-WEIGHT MANAGEMENT 1 MG/0.5ML ~~LOC~~ SOAJ: 1.0000 mg | SUBCUTANEOUS | 0 refills | Status: DC

## 2022-03-12 DIAGNOSIS — K5903 Drug induced constipation: Secondary | ICD-10-CM | POA: Insufficient documentation

## 2022-03-12 NOTE — Progress Notes (Signed)
? ? ? ?Chief Complaint:  ? ?OBESITY ?Vanessa Copeland is here to discuss her progress with her obesity treatment plan along with follow-up of her obesity related diagnoses. Vanessa Copeland is on the Category 3 Plan and states she is following her eating plan approximately 80% of the time. Vanessa Copeland states she is walking for 30 minutes 2 times per week. ? ?Today's visit was #: 36 ?Starting weight: 256 lbs ?Starting date: 09/06/2019 ?Today's weight: 249 lbs ?Today's date: 03/11/2022 ?Total lbs lost to date: 7 lbs ?Total lbs lost since last in-office visit: 0 ? ?Interim History:  ?Vanessa Copeland reports increased compliance on Cat 3 meal plan. ? ?GLP-1 - GIP/GLP-1 therapy hx: ?Saxenda '3mg'$  was ineffective and replaced with Mounjaro '5mg'$  on 10/21/21 ?Insurance would not cover Mounjaro, therefore replaced with Wegovy '1mg'$  at last OV 02/18/22. ?She has tolerated Wegovy '1mg'$  once week since the conversion. ?She denies mass in neck, dysphagia, dyspepsia, persistent hoarseness, abd pain, or GI upset. ? ?Subjective:  ? ?1. Pre-diabetes ?On 01/16/2022, A1c was 5.7. ?Denies 1st degree family history of T2D. ?She has tolerated Wegovy '1mg'$  once week since 02/18/22. ? ?2. Drug-induced constipation ?Historically daily BM- without difficulty. ?The last 2-3 weeks she will experience less frequent, hardened stools. ?She denies abdominal pain, hematochezia, or family hx of colon ca. ? ?3. Vitamin D deficiency ?On 01/16/2022, vitamin D level - 35.6. ?She is currently taking prescription ergocalciferol 50,000 IU each week. She denies nausea, vomiting or muscle weakness. ? ?Assessment/Plan:  ? ?1. Pre-diabetes ?Check labs at next office visit. ?Refill Wegovy 1 mg once weekly. ? ?- Refill Semaglutide-Weight Management 1 MG/0.5ML SOAJ; Inject 1 mg into the skin once a week for 28 days.  Dispense: 2 mL; Refill: 0 ? ?2. Drug-induced constipation ?Increase water intake, OTC stool softener, OTC MiraLAX. ? ?3. Vitamin D deficiency ?Check labs at next office visit. ?Refill  ergocalciferol 50,000 IU once weekly. ? ?- Refill Vitamin D, Ergocalciferol, (DRISDOL) 1.25 MG (50000 UNIT) CAPS capsule; Take 1 capsule (50,000 Units total) by mouth every 7 (seven) days.  Dispense: 4 capsule; Refill: 0 ? ?4. Obesity, Current BMI 44.2 ? ?Vanessa Copeland is currently in the action stage of change. As such, her goal is to continue with weight loss efforts. She has agreed to the Category 3 Plan.  ? ?Check fasting labs at next office visit. ? ? ?- Refill Semaglutide-Weight Management 1 MG/0.5ML SOAJ; Inject 1 mg into the skin once a week for 28 days.  Dispense: 2 mL; Refill: 0 ? ?Exercise goals:  Increase daily walking. ? ?Behavioral modification strategies: increasing lean protein intake, decreasing simple carbohydrates, meal planning and cooking strategies, keeping healthy foods in the home, and planning for success. ? ?Vanessa Copeland has agreed to follow-up with our clinic in 4 weeks, fasting. She was informed of the importance of frequent follow-up visits to maximize her success with intensive lifestyle modifications for her multiple health conditions.  ? ?Objective:  ? ?Blood pressure 122/80, pulse 66, temperature (!) 97.3 ?F (36.3 ?C), height '5\' 3"'$  (1.6 m), weight 249 lb (112.9 kg), SpO2 99 %. ?Body mass index is 44.11 kg/m?. ? ?General: Cooperative, alert, well developed, in no acute distress. ?HEENT: Conjunctivae and lids unremarkable. ?Cardiovascular: Regular rhythm.  ?Lungs: Normal work of breathing. ?Neurologic: No focal deficits.  ? ?Lab Results  ?Component Value Date  ? CREATININE 0.69 12/27/2021  ? BUN 14 12/27/2021  ? NA 140 12/27/2021  ? K 3.7 12/27/2021  ? CL 102 12/27/2021  ? CO2 30 12/27/2021  ? ?Lab Results  ?  Component Value Date  ? ALT 16 08/29/2021  ? AST 16 08/29/2021  ? ALKPHOS 86 08/29/2021  ? BILITOT 0.6 08/29/2021  ? ?Lab Results  ?Component Value Date  ? HGBA1C 5.7 (H) 01/16/2022  ? HGBA1C 6.0 (H) 08/29/2021  ? HGBA1C 5.6 11/27/2020  ? HGBA1C 5.8 (H) 07/24/2020  ? HGBA1C 5.4 03/28/2020   ? ?Lab Results  ?Component Value Date  ? INSULIN 11.6 01/16/2022  ? INSULIN 19.4 08/29/2021  ? INSULIN 3.8 11/27/2020  ? INSULIN 8.4 07/24/2020  ? INSULIN 6.6 03/28/2020  ? ?Lab Results  ?Component Value Date  ? TSH 2.000 03/28/2020  ? ?Lab Results  ?Component Value Date  ? CHOL 174 08/29/2021  ? HDL 43 08/29/2021  ? LDLCALC 118 (H) 08/29/2021  ? TRIG 69 08/29/2021  ? CHOLHDL 4.0 08/29/2021  ? ?Lab Results  ?Component Value Date  ? VD25OH 35.6 01/16/2022  ? VD25OH 34.4 08/29/2021  ? VD25OH 36.8 11/27/2020  ? ?Lab Results  ?Component Value Date  ? WBC 9.6 12/27/2021  ? HGB 10.8 (L) 12/27/2021  ? HCT 33.3 (L) 12/27/2021  ? MCV 88.1 12/27/2021  ? PLT 391 12/27/2021  ? ?Lab Results  ?Component Value Date  ? IRON 57 12/27/2019  ? TIBC 321 12/27/2019  ? FERRITIN 51 12/27/2019  ? ?Attestation Statements:  ? ?Reviewed by clinician on day of visit: allergies, medications, problem list, medical history, surgical history, family history, social history, and previous encounter notes. ? ?I, Water quality scientist, CMA, am acting as Location manager for Mina Marble, NP. ? ?I have reviewed the above documentation for accuracy and completeness, and I agree with the above. -  Brittnei Jagiello d. Jorian [place], NP-C ?

## 2022-03-31 ENCOUNTER — Encounter (INDEPENDENT_AMBULATORY_CARE_PROVIDER_SITE_OTHER): Payer: Self-pay | Admitting: Adult Health

## 2022-04-04 ENCOUNTER — Encounter (INDEPENDENT_AMBULATORY_CARE_PROVIDER_SITE_OTHER): Payer: Self-pay | Admitting: Adult Health

## 2022-04-15 ENCOUNTER — Ambulatory Visit (INDEPENDENT_AMBULATORY_CARE_PROVIDER_SITE_OTHER): Payer: BC Managed Care – PPO | Admitting: Adult Health

## 2022-04-23 ENCOUNTER — Ambulatory Visit (INDEPENDENT_AMBULATORY_CARE_PROVIDER_SITE_OTHER): Payer: BC Managed Care – PPO | Admitting: Adult Health

## 2022-04-23 VITALS — BP 127/86 | HR 82 | Temp 97.8°F | Ht 63.0 in | Wt 244.0 lb

## 2022-04-23 DIAGNOSIS — Z9189 Other specified personal risk factors, not elsewhere classified: Secondary | ICD-10-CM

## 2022-04-23 DIAGNOSIS — E559 Vitamin D deficiency, unspecified: Secondary | ICD-10-CM

## 2022-04-23 DIAGNOSIS — F3289 Other specified depressive episodes: Secondary | ICD-10-CM

## 2022-04-23 DIAGNOSIS — Z6841 Body Mass Index (BMI) 40.0 and over, adult: Secondary | ICD-10-CM

## 2022-04-23 DIAGNOSIS — R7303 Prediabetes: Secondary | ICD-10-CM | POA: Diagnosis not present

## 2022-04-23 DIAGNOSIS — E669 Obesity, unspecified: Secondary | ICD-10-CM | POA: Diagnosis not present

## 2022-04-23 MED ORDER — VITAMIN D (ERGOCALCIFEROL) 1.25 MG (50000 UNIT) PO CAPS: 50000.0000 [IU] | ORAL_CAPSULE | ORAL | 0 refills | Status: DC

## 2022-04-23 MED ORDER — SEMAGLUTIDE-WEIGHT MANAGEMENT 1 MG/0.5ML ~~LOC~~ SOAJ: 1.0000 mg | SUBCUTANEOUS | 0 refills | Status: DC

## 2022-04-28 ENCOUNTER — Ambulatory Visit (INDEPENDENT_AMBULATORY_CARE_PROVIDER_SITE_OTHER): Payer: BC Managed Care – PPO | Admitting: Podiatry

## 2022-04-28 DIAGNOSIS — M7662 Achilles tendinitis, left leg: Secondary | ICD-10-CM | POA: Diagnosis not present

## 2022-04-28 DIAGNOSIS — M722 Plantar fascial fibromatosis: Secondary | ICD-10-CM

## 2022-04-28 MED ORDER — MELOXICAM 15 MG PO TABS: 15.0000 mg | ORAL_TABLET | Freq: Every day | ORAL | 0 refills | Status: AC | PRN

## 2022-04-28 NOTE — Patient Instructions (Signed)
Achilles Tendinitis  ?with Rehab ?Achilles tendinitis is a disorder of the Achilles tendon. The Achilles tendon connects the large calf muscles (Gastrocnemius and Soleus) to the heel bone (calcaneus). This tendon is sometimes called the heel cord. It is important for pushing-off and standing on your toes and is important for walking, running, or jumping. Tendinitis is often caused by overuse and repetitive microtrauma. ?SYMPTOMS ?Pain, tenderness, swelling, warmth, and redness may occur over the Achilles tendon even at rest. ?Pain with pushing off, or flexing or extending the ankle. ?Pain that is worsened after or during activity. ?CAUSES  ?Overuse sometimes seen with rapid increase in exercise programs or in sports requiring running and jumping. ?Poor physical conditioning (strength and flexibility or endurance). ?Running sports, especially training running down hills. ?Inadequate warm-up before practice or play or failure to stretch before participation. ?Injury to the tendon. ?PREVENTION  ?Warm up and stretch before practice or competition. ?Allow time for adequate rest and recovery between practices and competition. ?Keep up conditioning. ?Keep up ankle and leg flexibility. ?Improve or keep muscle strength and endurance. ?Improve cardiovascular fitness. ?Use proper technique. ?Use proper equipment (shoes, skates). ?To help prevent recurrence, taping, protective strapping, or an adhesive bandage may be recommended for several weeks after healing is complete. ?PROGNOSIS  ?Recovery may take weeks to several months to heal. ?Longer recovery is expected if symptoms have been prolonged. ?Recovery is usually quicker if the inflammation is due to a direct blow as compared with overuse or sudden strain. ?RELATED COMPLICATIONS  ?Healing time will be prolonged if the condition is not correctly treated. The injury must be given plenty of time to heal. ?Symptoms can reoccur if activity is resumed too soon. ?Untreated,  tendinitis may increase the risk of tendon rupture requiring additional time for recovery and possibly surgery. ?TREATMENT  ?The first treatment consists of rest anti-inflammatory medication, and ice to relieve the pain. ?Stretching and strengthening exercises after resolution of pain will likely help reduce the risk of recurrence. Referral to a physical therapist or athletic trainer for further evaluation and treatment may be helpful. ?A walking boot or cast may be recommended to rest the Achilles tendon. This can help break the cycle of inflammation and microtrauma. ?Arch supports (orthotics) may be prescribed or recommended by your caregiver as an adjunct to therapy and rest. ?Surgery to remove the inflamed tendon lining or degenerated tendon tissue is rarely necessary and has shown less than predictable results. ?MEDICATION  ?Nonsteroidal anti-inflammatory medications, such as aspirin and ibuprofen, may be used for pain and inflammation relief. Do not take within 7 days before surgery. Take these as directed by your caregiver. Contact your caregiver immediately if any bleeding, stomach upset, or signs of allergic reaction occur. Other minor pain relievers, such as acetaminophen, may also be used. ?Pain relievers may be prescribed as necessary by your caregiver. Do not take prescription pain medication for longer than 4 to 7 days. Use only as directed and only as much as you need. ?Cortisone injections are rarely indicated. Cortisone injections may weaken tendons and predispose to rupture. It is better to give the condition more time to heal than to use them. ?HEAT AND COLD ?Cold is used to relieve pain and reduce inflammation for acute and chronic Achilles tendinitis. Cold should be applied for 10 to 15 minutes every 2 to 3 hours for inflammation and pain and immediately after any activity that aggravates your symptoms. Use ice packs or an ice massage. ?Heat may be used before performing stretching  and  strengthening activities prescribed by your caregiver. Use a heat pack or a warm soak. ?SEEK MEDICAL CARE IF: ?Symptoms get worse or do not improve in 2 weeks despite treatment. ?New, unexplained symptoms develop. Drugs used in treatment may produce side effects. ? ?EXERCISES: ? ?RANGE OF MOTION (ROM) AND STRETCHING EXERCISES - Achilles Tendinitis  ?These exercises may help you when beginning to rehabilitate your injury. Your symptoms may resolve with or without further involvement from your physician, physical therapist or athletic trainer. While completing these exercises, remember:  ?Restoring tissue flexibility helps normal motion to return to the joints. This allows healthier, less painful movement and activity. ?An effective stretch should be held for at least 30 seconds. ?A stretch should never be painful. You should only feel a gentle lengthening or release in the stretched tissue. ? ?STRETCH  Gastroc, Standing  ?Place hands on wall. ?Extend right / left leg, keeping the front knee somewhat bent. ?Slightly point your toes inward on your back foot. ?Keeping your right / left heel on the floor and your knee straight, shift your weight toward the wall, not allowing your back to arch. ?You should feel a gentle stretch in the right / left calf. Hold this position for 10 seconds. ?Repeat 3 times. Complete this stretch 2 times per day. ? ?STRETCH  Soleus, Standing  ?Place hands on wall. ?Extend right / left leg, keeping the other knee somewhat bent. ?Slightly point your toes inward on your back foot. ?Keep your right / left heel on the floor, bend your back knee, and slightly shift your weight over the back leg so that you feel a gentle stretch deep in your back calf. ?Hold this position for 10 seconds. ?Repeat 3 times. Complete this stretch 2 times per day. ? ?Google, Standing  ?Note: This exercise can place a lot of stress on your foot and ankle. Please complete this exercise only if specifically  instructed by your caregiver.  ?Place the ball of your right / left foot on a step, keeping your other foot firmly on the same step. ?Hold on to the wall or a rail for balance. ?Slowly lift your other foot, allowing your body weight to press your heel down over the edge of the step. ?You should feel a stretch in your right / left calf. ?Hold this position for 10 seconds. ?Repeat this exercise with a slight bend in your knee. ?Repeat 3 times. Complete this stretch 2 times per day.  ? ?STRENGTHENING EXERCISES - Achilles Tendinitis ?These exercises may help you when beginning to rehabilitate your injury. They may resolve your symptoms with or without further involvement from your physician, physical therapist or athletic trainer. While completing these exercises, remember:  ?Muscles can gain both the endurance and the strength needed for everyday activities through controlled exercises. ?Complete these exercises as instructed by your physician, physical therapist or athletic trainer. Progress the resistance and repetitions only as guided. ?You may experience muscle soreness or fatigue, but the pain or discomfort you are trying to eliminate should never worsen during these exercises. If this pain does worsen, stop and make certain you are following the directions exactly. If the pain is still present after adjustments, discontinue the exercise until you can discuss the trouble with your clinician. ? ?STRENGTH - Plantar-flexors  ?Sit with your right / left leg extended. Holding onto both ends of a rubber exercise band/tubing, loop it around the ball of your foot. Keep a slight tension in the band. ?Slowly  push your toes away from you, pointing them downward. ?Hold this position for 10 seconds. Return slowly, controlling the tension in the band/tubing. ?Repeat 3 times. Complete this exercise 2 times per day.  ? ?STRENGTH - Plantar-flexors  ?Stand with your feet shoulder width apart. Steady yourself with a wall or table  using as little support as needed. ?Keeping your weight evenly spread over the width of your feet, rise up on your toes.* ?Hold this position for 10 seconds. ?Repeat 3 times. Complete this exercise 2 times per day.  ?

## 2022-04-29 NOTE — Progress Notes (Signed)
Subjective: ?56 year old female presents the office today for evaluation of left heel pain, Planter fasciitis.  The injection was helpful overall that is been doing better but yesterday she noticed a pulling sensation in the back of her heel points along the distal portion Achilles tendon.  No popping sensation.  No swelling.  No injury that she reports.  She still doing the stretching exercises intermittently.  She has no new concerns otherwise. ? ?Objective: ?AAO x3, NAD ?DP/PT pulses palpable bilaterally, CRT less than 3 seconds ?There is, palpation on the plantar aspect of calcaneus, insertion of plantar fascia on the left side.  She does have some discomfort on the distal portion of the Achilles tendon along the insertion of the calcaneus.  There is no deficit is noted within the Achilles tendon clinically the tendon appears to be intact.  There is no edema.  There is negative Tinel sign.  No other area discomfort. ?No pain with calf compression, swelling, warmth, erythema ? ?Assessment: ?Plantar fascia, Achilles tendinitis left side ? ?Plan: ?-All treatment options discussed with the patient including all alternatives, risks, complications.  ?-I think she strained her Achilles tendon as she increased her activity yesterday.  We discussed continue stretching, icing daily.  Will refer to physical therapy.  Referral for [facility] placed.  Continue issues good arch supports.  Refill meloxicam.  Heel lift for now. ?-Patient encouraged to call the office with any questions, concerns, change in symptoms.  ? ?Trula Slade DPM ? ? ?

## 2022-05-04 NOTE — Progress Notes (Signed)
? ? ? ?Chief Complaint:  ? ?OBESITY ?Vanessa Copeland is here to discuss her progress with her obesity treatment plan along with follow-up of her obesity related diagnoses. Vanessa Copeland is on the Category 3 Plan or Category 4 and states she is following her eating plan approximately 80% of the time. Vanessa Copeland states she is walking 25 minutes 2 times per week. ? ?Today's visit was #: 28 ?Starting weight: 256 ?Starting date: 09/06/2019 ?Today's weight: 244 ?Today's date: 04/23/2022 ?Total lbs lost to date: 12 ?Total lbs lost since last in-office visit: 5 ? ?Interim History:  ?Vanessa Copeland will graduate with BS in liberal studies from [employer].  Her commencement in ceremony is  05/03/2022. ? ?Subjective:  ? ?1. Pre-diabetes ?Vanessa Copeland is on Wegovy 1 mg once weekly.   ?She will experience occasional nausea without vomiting.  ?She denies mass in neck, dysphagia, dyspepsia, persistent hoarseness, abd pain, or constipation. ?She injects on Fridays.   ?01/16/22 A1C 5.7, improved form 6.0 on 08/29/21 ? ?2. Vitamin D deficiency ?01/16/22 Vitamin D level 35.6, well below goal of 50-70.  S ?he is on Ergocalciferol Vitamin D and denies any nausea, vomiting or muscle weakness.   ?She endorses fatigue.  ? ?3. Other depression, with emotional eating ?Vanessa Copeland stopped her Lexapro 10 mg suddenly more than 3 months ago.   ?She reports mood stable and denies suicidal or homicidal ideations. ? ?4. At risk for deficient intake of food ?The patient is at a higher than average risk of deficient intake of food due to nausea with GLP-1 ? ?Assessment/Plan:  ? ?1. Pre-diabetes ?Emali will continue on Wegovy 1 mg and refill is provided. ? ?2. Vitamin D deficiency ?Eleshia will continue on Ergocalciferol Vitamin D 50,000 units weekly and refill is provided. ?- Vitamin D, Ergocalciferol, (DRISDOL) 1.25 MG (50000 UNIT) CAPS capsule; Take 1 capsule (50,000 Units total) by mouth every 7 (seven) days.  Dispense: 4 capsule; Refill: 0 ? ?3. Other depression, with emotional eating ?Vanessa Copeland  will remain off Lexapro and continue regular exercise. ? ?4. At risk for deficient intake of food ?At risk due to experiencing nausea with GLP-1. ? ?5. Obesity with current BMI of 43.3 ?Vanessa Copeland will get fasting labs on next visit.  ? ?Vanessa Copeland is currently in the action stage of change. As such, her goal is to continue with weight loss efforts. She has agreed to the Category 3 Plan.  ? ?- Semaglutide-Weight Management 1 MG/0.5ML SOAJ; Inject 1 mg into the skin once a week for 28 days.  Dispense: 2 mL; Refill: 0 ? ?Exercise goals: As is  ? ?Behavioral modification strategies: increasing lean protein intake, decreasing simple carbohydrates, meal planning and cooking strategies, keeping healthy foods in the home, and planning for success. ? ?Vanessa Copeland has agreed to follow-up with our clinic in 3 weeks, fasting. She was informed of the importance of frequent follow-up visits to maximize her success with intensive lifestyle modifications for her multiple health conditions.  ? ? ?Objective:  ? ?Blood pressure 127/86, pulse 82, temperature 97.8 ?F (36.6 ?C), height '5\' 3"'$  (1.6 m), weight 244 lb (110.7 kg), SpO2 100 %. ?Body mass index is 43.22 kg/m?. ? ?General: Cooperative, alert, well developed, in no acute distress. ?HEENT: Conjunctivae and lids unremarkable. ?Cardiovascular: Regular rhythm.  ?Lungs: Normal work of breathing. ?Neurologic: No focal deficits.  ? ?Lab Results  ?Component Value Date  ? CREATININE 0.69 12/27/2021  ? BUN 14 12/27/2021  ? NA 140 12/27/2021  ? K 3.7 12/27/2021  ? CL 102  12/27/2021  ? CO2 30 12/27/2021  ? ?Lab Results  ?Component Value Date  ? ALT 16 08/29/2021  ? AST 16 08/29/2021  ? ALKPHOS 86 08/29/2021  ? BILITOT 0.6 08/29/2021  ? ?Lab Results  ?Component Value Date  ? HGBA1C 5.7 (H) 01/16/2022  ? HGBA1C 6.0 (H) 08/29/2021  ? HGBA1C 5.6 11/27/2020  ? HGBA1C 5.8 (H) 07/24/2020  ? HGBA1C 5.4 03/28/2020  ? ?Lab Results  ?Component Value Date  ? INSULIN 11.6 01/16/2022  ? INSULIN 19.4 08/29/2021  ?  INSULIN 3.8 11/27/2020  ? INSULIN 8.4 07/24/2020  ? INSULIN 6.6 03/28/2020  ? ?Lab Results  ?Component Value Date  ? TSH 2.000 03/28/2020  ? ?Lab Results  ?Component Value Date  ? CHOL 174 08/29/2021  ? HDL 43 08/29/2021  ? LDLCALC 118 (H) 08/29/2021  ? TRIG 69 08/29/2021  ? CHOLHDL 4.0 08/29/2021  ? ?Lab Results  ?Component Value Date  ? VD25OH 35.6 01/16/2022  ? VD25OH 34.4 08/29/2021  ? VD25OH 36.8 11/27/2020  ? ?Lab Results  ?Component Value Date  ? WBC 9.6 12/27/2021  ? HGB 10.8 (L) 12/27/2021  ? HCT 33.3 (L) 12/27/2021  ? MCV 88.1 12/27/2021  ? PLT 391 12/27/2021  ? ?Lab Results  ?Component Value Date  ? IRON 57 12/27/2019  ? TIBC 321 12/27/2019  ? FERRITIN 51 12/27/2019  ? ? ?Attestation Statements:  ? ?Reviewed by clinician on day of visit: allergies, medications, problem list, medical history, surgical history, family history, social history, and previous encounter notes. ? ?I, Althea Charon, am acting as Location manager for [employer]. ? ?I have reviewed the above documentation for accuracy and completeness, and I agree with the above. -  Haydon Kalmar d. Aiman Sonn, NP-C ? ?

## 2022-05-13 ENCOUNTER — Other Ambulatory Visit (INDEPENDENT_AMBULATORY_CARE_PROVIDER_SITE_OTHER): Payer: Self-pay | Admitting: Adult Health

## 2022-05-13 DIAGNOSIS — R7303 Prediabetes: Secondary | ICD-10-CM

## 2022-05-20 ENCOUNTER — Encounter (INDEPENDENT_AMBULATORY_CARE_PROVIDER_SITE_OTHER): Payer: Self-pay | Admitting: Family Medicine

## 2022-05-20 ENCOUNTER — Ambulatory Visit (INDEPENDENT_AMBULATORY_CARE_PROVIDER_SITE_OTHER): Payer: BC Managed Care – PPO | Admitting: Family Medicine

## 2022-05-20 VITALS — BP 120/74 | HR 78 | Temp 97.8°F | Ht 63.0 in | Wt 240.0 lb

## 2022-05-20 DIAGNOSIS — D649 Anemia, unspecified: Secondary | ICD-10-CM

## 2022-05-20 DIAGNOSIS — E669 Obesity, unspecified: Secondary | ICD-10-CM

## 2022-05-20 DIAGNOSIS — Z6841 Body Mass Index (BMI) 40.0 and over, adult: Secondary | ICD-10-CM

## 2022-05-20 DIAGNOSIS — E559 Vitamin D deficiency, unspecified: Secondary | ICD-10-CM | POA: Diagnosis not present

## 2022-05-20 DIAGNOSIS — R7303 Prediabetes: Secondary | ICD-10-CM

## 2022-05-20 DIAGNOSIS — Z7985 Long-term (current) use of injectable non-insulin antidiabetic drugs: Secondary | ICD-10-CM

## 2022-05-20 MED ORDER — SEMAGLUTIDE-WEIGHT MANAGEMENT 1 MG/0.5ML ~~LOC~~ SOAJ: 1.0000 mg | SUBCUTANEOUS | 0 refills | Status: DC

## 2022-05-20 MED ORDER — VITAMIN D (ERGOCALCIFEROL) 1.25 MG (50000 UNIT) PO CAPS: 50000.0000 [IU] | ORAL_CAPSULE | ORAL | 0 refills | Status: DC

## 2022-05-21 ENCOUNTER — Ambulatory Visit (INDEPENDENT_AMBULATORY_CARE_PROVIDER_SITE_OTHER): Payer: BC Managed Care – PPO | Admitting: Adult Health

## 2022-05-21 LAB — CMP14+EGFR
ALT: 15 IU/L (ref 0–32)
AST: 13 IU/L (ref 0–40)
Albumin/Globulin Ratio: 1.6 (ref 1.2–2.2)
Albumin: 4.4 g/dL (ref 3.8–4.9)
Alkaline Phosphatase: 94 IU/L (ref 44–121)
BUN/Creatinine Ratio: 18 (ref 9–23)
BUN: 14 mg/dL (ref 6–24)
Bilirubin Total: 0.5 mg/dL (ref 0.0–1.2)
CO2: 25 mmol/L (ref 20–29)
Calcium: 9.6 mg/dL (ref 8.7–10.2)
Chloride: 102 mmol/L (ref 96–106)
Creatinine, Ser: 0.76 mg/dL (ref 0.57–1.00)
Globulin, Total: 2.8 g/dL (ref 1.5–4.5)
Glucose: 85 mg/dL (ref 70–99)
Potassium: 4.2 mmol/L (ref 3.5–5.2)
Sodium: 141 mmol/L (ref 134–144)
Total Protein: 7.2 g/dL (ref 6.0–8.5)
eGFR: 93 mL/min/{1.73_m2} (ref >59)

## 2022-05-21 LAB — CBC WITH DIFFERENTIAL/PLATELET
Basophils Absolute: 0 10*3/uL (ref 0.0–0.2)
Basos: 0 %
EOS (ABSOLUTE): 0.1 10*3/uL (ref 0.0–0.4)
Eos: 1 %
Hematocrit: 34.4 % (ref 34.0–46.6)
Hemoglobin: 11.5 g/dL (ref 11.1–15.9)
Immature Grans (Abs): 0 10*3/uL (ref 0.0–0.1)
Immature Granulocytes: 0 %
Lymphocytes Absolute: 2.5 10*3/uL (ref 0.7–3.1)
Lymphs: 32 %
MCH: 29.3 pg (ref 26.6–33.0)
MCHC: 33.4 g/dL (ref 31.5–35.7)
MCV: 88 fL (ref 79–97)
Monocytes Absolute: 0.4 10*3/uL (ref 0.1–0.9)
Monocytes: 6 %
Neutrophils Absolute: 4.8 10*3/uL (ref 1.4–7.0)
Neutrophils: 61 %
Platelets: 150 10*3/uL (ref 150–450)
RBC: 3.92 x10E6/uL (ref 3.77–5.28)
RDW: 14.6 % (ref 11.7–15.4)
WBC: 7.8 10*3/uL (ref 3.4–10.8)

## 2022-05-21 LAB — LIPID PANEL WITH LDL/HDL RATIO
Cholesterol, Total: 188 mg/dL (ref 100–199)
HDL: 43 mg/dL (ref >39)
LDL Chol Calc (NIH): 131 mg/dL — ABNORMAL HIGH (ref 0–99)
LDL/HDL Ratio: 3 ratio (ref 0.0–3.2)
Triglycerides: 77 mg/dL (ref 0–149)
VLDL Cholesterol Cal: 14 mg/dL (ref 5–40)

## 2022-05-21 LAB — VITAMIN D 25 HYDROXY (VIT D DEFICIENCY, FRACTURES): Vit D, 25-Hydroxy: 37.8 ng/mL (ref 30.0–100.0)

## 2022-05-21 LAB — INSULIN, RANDOM: INSULIN: 11.4 u[IU]/mL (ref 2.6–24.9)

## 2022-05-21 LAB — VITAMIN B12: Vitamin B-12: 295 pg/mL (ref 232–1245)

## 2022-05-21 LAB — HEMOGLOBIN A1C
Est. average glucose Bld gHb Est-mCnc: 108 mg/dL
Hgb A1c MFr Bld: 5.4 % (ref 4.8–5.6)

## 2022-05-21 LAB — TSH: TSH: 2.35 u[IU]/mL (ref 0.450–4.500)

## 2022-05-26 ENCOUNTER — Encounter (INDEPENDENT_AMBULATORY_CARE_PROVIDER_SITE_OTHER): Payer: Self-pay | Admitting: Family Medicine

## 2022-05-26 NOTE — Telephone Encounter (Signed)
Dr.Beasley 

## 2022-05-27 NOTE — Progress Notes (Signed)
Chief Complaint:   OBESITY Vanessa Copeland is here to discuss her progress with her obesity treatment plan along with follow-up of her obesity related diagnoses. Vanessa Copeland is on the Category 3 Plan and states she is following her eating plan approximately 75% of the time. Vanessa Copeland states she is walking for 20 minutes 3 times per week.  Today's visit was #: 56 Starting weight: 256 lbs Starting date: 09/06/2019 Today's weight: 240 lbs Today's date: 05/20/2022 Total lbs lost to date: 16 Total lbs lost since last in-office visit: 4  Interim History: Vanessa Copeland continues to do well with weight loss even losing weight over [employer]. Her hunger is controlled and she is doing well with meeting her protein goals.   Subjective:   1. Vitamin D deficiency Vanessa Copeland is on Vitamin D, and she is due for labs.   2. Anemia, unspecified type Vanessa Copeland has a history of heavy cycles. She is on merena now, and she is due for labs.   3. Pre-diabetes Vanessa Copeland is doing well with her diet and weight loss. She is on GLP-1.  Assessment/Plan:   1. Vitamin D deficiency We will check labs today, and we will refill prescription Vitamin D for 1 month. Vanessa Copeland will follow-up for routine testing of Vitamin D, at least 2-3 times per year to avoid over-replacement.  - VITAMIN D 25 Hydroxy (Vit-D Deficiency, Fractures) - Vitamin D, Ergocalciferol, (DRISDOL) 1.25 MG (50000 UNIT) CAPS capsule; Take 1 capsule (50,000 Units total) by mouth every 7 (seven) days.  Dispense: 4 capsule; Refill: 0  2. Anemia, unspecified type We will check labs today. Orders and follow up as documented in patient record.  - CBC with Differential/Platelet  3. Pre-diabetes We will check labs today. Vanessa Copeland will continue her diet, exercise, and decreasing simple carbohydrates to help decrease the risk of diabetes.   - CMP14+EGFR - Vitamin B12 - Insulin, random - Hemoglobin A1c - Lipid Panel With LDL/HDL Ratio - TSH  4. Obesity, Current BMI  42.6 Vanessa Copeland is currently in the action stage of change. As such, her goal is to continue with weight loss efforts. She has agreed to the Category 3 Plan.   We discussed various medication options to help Vanessa Copeland with her weight loss efforts and we both agreed to continue Wegovy, and we will refill for 1 month.  - Semaglutide-Weight Management 1 MG/0.5ML SOAJ; Inject 1 mg into the skin once a week for 28 days.  Dispense: 2 mL; Refill: 0  Exercise goals: As is.   Behavioral modification strategies: increasing lean protein intake, meal planning and cooking strategies, and emotional eating strategies.  Vanessa Copeland has agreed to follow-up with our clinic in 3 weeks. She was informed of the importance of frequent follow-up visits to maximize her success with intensive lifestyle modifications for her multiple health conditions.   Vanessa Copeland was informed we would discuss her lab results at her next visit unless there is a critical issue that needs to be addressed sooner. Vanessa Copeland agreed to keep her next visit at the agreed upon time to discuss these results.  Objective:   Blood pressure 120/74, pulse 78, temperature 97.8 F (36.6 C), height 5' 3"  (1.6 m), weight 240 lb (108.9 kg), SpO2 98 %. Body mass index is 42.51 kg/m.  General: Cooperative, alert, well developed, in no acute distress. HEENT: Conjunctivae and lids unremarkable. Cardiovascular: Regular rhythm.  Lungs: Normal work of breathing. Neurologic: No focal deficits.   Lab Results  Component Value Date   CREATININE 0.76 05/20/2022  BUN 14 05/20/2022   NA 141 05/20/2022   K 4.2 05/20/2022   CL 102 05/20/2022   CO2 25 05/20/2022   Lab Results  Component Value Date   ALT 15 05/20/2022   AST 13 05/20/2022   ALKPHOS 94 05/20/2022   BILITOT 0.5 05/20/2022   Lab Results  Component Value Date   HGBA1C 5.4 05/20/2022   HGBA1C 5.7 (H) 01/16/2022   HGBA1C 6.0 (H) 08/29/2021   HGBA1C 5.6 11/27/2020   HGBA1C 5.8 (H) 07/24/2020   Lab  Results  Component Value Date   INSULIN 11.4 05/20/2022   INSULIN 11.6 01/16/2022   INSULIN 19.4 08/29/2021   INSULIN 3.8 11/27/2020   INSULIN 8.4 07/24/2020   Lab Results  Component Value Date   TSH 2.350 05/20/2022   Lab Results  Component Value Date   CHOL 188 05/20/2022   HDL 43 05/20/2022   LDLCALC 131 (H) 05/20/2022   TRIG 77 05/20/2022   CHOLHDL 4.0 08/29/2021   Lab Results  Component Value Date   VD25OH 37.8 05/20/2022   VD25OH 35.6 01/16/2022   VD25OH 34.4 08/29/2021   Lab Results  Component Value Date   WBC 7.8 05/20/2022   HGB 11.5 05/20/2022   HCT 34.4 05/20/2022   MCV 88 05/20/2022   PLT 150 05/20/2022   Lab Results  Component Value Date   IRON 57 12/27/2019   TIBC 321 12/27/2019   FERRITIN 51 12/27/2019   Attestation Statements:   Reviewed by clinician on day of visit: allergies, medications, problem list, medical history, surgical history, family history, social history, and previous encounter notes.   I, Trixie Dredge, am acting as transcriptionist for Dennard Nip, MD.  I have reviewed the above documentation for accuracy and completeness, and I agree with the above. -  Dennard Nip, MD

## 2022-05-28 ENCOUNTER — Encounter (INDEPENDENT_AMBULATORY_CARE_PROVIDER_SITE_OTHER): Payer: Self-pay | Admitting: Adult Health

## 2022-05-28 ENCOUNTER — Other Ambulatory Visit (INDEPENDENT_AMBULATORY_CARE_PROVIDER_SITE_OTHER): Payer: Self-pay | Admitting: Family Medicine

## 2022-05-28 NOTE — Telephone Encounter (Signed)
Pt last saw Dr.Beasley. Please advise.

## 2022-05-29 ENCOUNTER — Other Ambulatory Visit (INDEPENDENT_AMBULATORY_CARE_PROVIDER_SITE_OTHER): Payer: Self-pay | Admitting: Adult Health

## 2022-05-29 MED ORDER — WEGOVY 1.7 MG/0.75ML ~~LOC~~ SOAJ: 1.7000 mg | SUBCUTANEOUS | 0 refills | Status: DC

## 2022-05-29 NOTE — Telephone Encounter (Signed)
Please Advise patient.

## 2022-05-30 ENCOUNTER — Ambulatory Visit: Payer: BC Managed Care – PPO | Admitting: Family Medicine

## 2022-06-19 ENCOUNTER — Encounter (INDEPENDENT_AMBULATORY_CARE_PROVIDER_SITE_OTHER): Payer: Self-pay | Admitting: Adult Health

## 2022-06-19 ENCOUNTER — Ambulatory Visit (INDEPENDENT_AMBULATORY_CARE_PROVIDER_SITE_OTHER): Payer: BC Managed Care – PPO | Admitting: Adult Health

## 2022-06-19 VITALS — BP 145/71 | HR 65 | Temp 97.3°F | Ht 63.0 in | Wt 241.0 lb

## 2022-06-19 DIAGNOSIS — E559 Vitamin D deficiency, unspecified: Secondary | ICD-10-CM | POA: Diagnosis not present

## 2022-06-19 DIAGNOSIS — E669 Obesity, unspecified: Secondary | ICD-10-CM

## 2022-06-19 DIAGNOSIS — R030 Elevated blood-pressure reading, without diagnosis of hypertension: Secondary | ICD-10-CM | POA: Insufficient documentation

## 2022-06-19 DIAGNOSIS — Z7985 Long-term (current) use of injectable non-insulin antidiabetic drugs: Secondary | ICD-10-CM

## 2022-06-19 DIAGNOSIS — R7303 Prediabetes: Secondary | ICD-10-CM

## 2022-06-19 DIAGNOSIS — E7849 Other hyperlipidemia: Secondary | ICD-10-CM

## 2022-06-19 DIAGNOSIS — Z6841 Body Mass Index (BMI) 40.0 and over, adult: Secondary | ICD-10-CM

## 2022-06-19 DIAGNOSIS — Z9189 Other specified personal risk factors, not elsewhere classified: Secondary | ICD-10-CM

## 2022-06-19 MED ORDER — WEGOVY 1.7 MG/0.75ML ~~LOC~~ SOAJ: 1.7000 mg | SUBCUTANEOUS | 0 refills | Status: DC

## 2022-06-19 MED ORDER — VITAMIN D (ERGOCALCIFEROL) 1.25 MG (50000 UNIT) PO CAPS: 50000.0000 [IU] | ORAL_CAPSULE | ORAL | 0 refills | Status: DC

## 2022-06-19 MED ORDER — ROSUVASTATIN CALCIUM 10 MG PO TABS: ORAL_TABLET | ORAL | 0 refills | Status: DC

## 2022-06-19 NOTE — Progress Notes (Signed)
Chief Complaint:   OBESITY Vanessa Copeland is here to discuss her progress with her obesity treatment plan along with follow-up of her obesity related diagnoses. Vanessa Copeland is on the Category 3 Plan and states she is following her eating plan approximately 75% of the time. Vanessa Copeland states she is walking 20 minutes 2 times per week.  Today's visit was #: 64 Starting weight: 256 lbs Starting date: 09/06/2019 Today's weight: 241 lbs Today's date: 06/19/2022 Total lbs lost to date: 15 lbs Total lbs lost since last in-office visit: 0  Interim History:  Vanessa Copeland is currently on Wegovy 1.7 mg - injects on Sat -she denies mass in neck, dysphagia, dyspepsia, persistent hoarseness, abd pain, or N/V/Constipation.  On 05/21/22 her husband underwent successful CABG x3.  He is stable and about to begin cardiac rehab.  Subjective:   1. Elevated BP without diagnosis of hypertension Labs discussed during visit today.  BP elevated at OV. She denies chest pain/dyspnea.   Per Mayford Knife, she has not used Propranolol this year. BB- tx of palpitations.  Last episode of palpitations was fall 2022.   She has never been on antihypertensive therapy for HTN.  2. Hyperlipidemia, pure Labs discussed during visit today.  Lipid Panel     Component Value Date/Time   CHOL 188 05/20/2022 0828   TRIG 77 05/20/2022 0828   HDL 43 05/20/2022 0828   CHOLHDL 4.0 08/29/2021 0908   LDLCALC 131 (H) 05/20/2022 0828   LABVLDL 14 05/20/2022 0828   05/20/2022 CMP- LFTs normal  Vanessa Copeland father had hyperlipidemia, her mother has hypertension. She feels that 2 of her siblings have hypertension.   ASCVD risk stratification is 8.7%, above goal.  3. Vitamin D deficiency Labs discussed during visit today.  05/20/22- Vit D level - 37.8- subtherapeutic despite once weekly Ergocalciferol.  4. Prediabetes Labs discussed during visit today. 05/20/22, A1c, Blood glucose, Insulin.  Patient is on Wegovy 1.7 mg once weekly denies medication  SE.  5. At risk for hypertension The patient is at a higher than average risk of hypertension due to elevated blood pressure.  Assessment/Plan:   1. Elevated BP without diagnosis of hypertension Vanessa Copeland will decrease Na+, increase walking. Will closely monitor blood pressure.  2. Hyperlipidemia, pure Start Crestor 10 mg twice a week fill for 1 month with 0 refills.  -Start rosuvastatin (CRESTOR) 10 MG tablet; Take Crestor daily  Dispense: 30 tablet; Refill: 0  Check Lipid panel and CMP in 6-8 weeks (mid to end August 2023)  3. Vitamin D deficiency We will refill Ergocalciferol 50,000 IU once a week for 1 month with 0 refills.  Refill- Vitamin D, Ergocalciferol, (DRISDOL) 1.25 MG (50000 UNIT) CAPS capsule; Take 1 capsule (50,000 Units total) by mouth every 7 (seven) days.  Dispense: 4 capsule; Refill: 0  4. Prediabetes Continue with GLP-1 therapy.  5. At risk for hypertension Vanessa Copeland was given approximately 15 minutes of hypertension prevention counseling today. Vanessa Copeland is at risk for hypertension due to obesity. We discussed intensive lifestyle modifications today with an emphasis on weight loss as well as increasing exercise and decreasing salt intake.   Repetitive spaced learning was employed today to elicit superior memory formation and behavioral change.  6. Obesity, Current BMI 42.8 We will refill Wegovy 1.7 mg SubQ once weekly for 1 month with 0 refills. We will check labs 6-8 weeks.  -Refill Semaglutide-Weight Management (WEGOVY) 1.7 MG/0.75ML SOAJ; Inject 1.7 mg into the skin once a week.  Dispense: 3 mL; Refill:  0  Vanessa Copeland is currently in the action stage of change. As such, her goal is to continue with weight loss efforts. She has agreed to the Category 3 Plan.   Exercise goals: As is.  Behavioral modification strategies: increasing lean protein intake, decreasing simple carbohydrates, meal planning and cooking strategies, keeping healthy foods in the home, and  planning for success.  Vanessa Copeland has agreed to follow-up with our clinic in 4 weeks. She was informed of the importance of frequent follow-up visits to maximize her success with intensive lifestyle modifications for her multiple health conditions.   Objective:   Blood pressure (!) 145/71, pulse 65, temperature (!) 97.3 F (36.3 C), height '5\' 3"'$  (1.6 m), weight 241 lb (109.3 kg), SpO2 99 %. Body mass index is 42.69 kg/m.  General: Cooperative, alert, well developed, in no acute distress. HEENT: Conjunctivae and lids unremarkable. Cardiovascular: Regular rhythm.  Lungs: Normal work of breathing. Neurologic: No focal deficits.   Lab Results  Component Value Date   CREATININE 0.76 05/20/2022   BUN 14 05/20/2022   NA 141 05/20/2022   K 4.2 05/20/2022   CL 102 05/20/2022   CO2 25 05/20/2022   Lab Results  Component Value Date   ALT 15 05/20/2022   AST 13 05/20/2022   ALKPHOS 94 05/20/2022   BILITOT 0.5 05/20/2022   Lab Results  Component Value Date   HGBA1C 5.4 05/20/2022   HGBA1C 5.7 (H) 01/16/2022   HGBA1C 6.0 (H) 08/29/2021   HGBA1C 5.6 11/27/2020   HGBA1C 5.8 (H) 07/24/2020   Lab Results  Component Value Date   INSULIN 11.4 05/20/2022   INSULIN 11.6 01/16/2022   INSULIN 19.4 08/29/2021   INSULIN 3.8 11/27/2020   INSULIN 8.4 07/24/2020   Lab Results  Component Value Date   TSH 2.350 05/20/2022   Lab Results  Component Value Date   CHOL 188 05/20/2022   HDL 43 05/20/2022   LDLCALC 131 (H) 05/20/2022   TRIG 77 05/20/2022   CHOLHDL 4.0 08/29/2021   Lab Results  Component Value Date   VD25OH 37.8 05/20/2022   VD25OH 35.6 01/16/2022   VD25OH 34.4 08/29/2021   Lab Results  Component Value Date   WBC 7.8 05/20/2022   HGB 11.5 05/20/2022   HCT 34.4 05/20/2022   MCV 88 05/20/2022   PLT 150 05/20/2022   Lab Results  Component Value Date   IRON 57 12/27/2019   TIBC 321 12/27/2019   FERRITIN 51 12/27/2019   Attestation Statements:   Reviewed by  clinician on day of visit: allergies, medications, problem list, medical history, surgical history, family history, social history, and previous encounter notes.  Vanessa Copeland, Brendell Tyus, RMA, am acting as transcriptionist for Mina Marble, NP.  Vanessa Copeland have reviewed the above documentation for accuracy and completeness, and Vanessa Copeland agree with the above. -  Jadd Gasior d. Wallice Granville, NP-C

## 2022-06-20 ENCOUNTER — Encounter: Payer: Self-pay | Admitting: Internal Medicine

## 2022-07-09 ENCOUNTER — Encounter: Payer: Self-pay | Admitting: Cardiovascular Disease

## 2022-07-09 NOTE — Progress Notes (Signed)
Cardiology Office Note:    Date:  07/11/2022   ID:  Vanessa Copeland, DOB 18-Jun-1966, MRN [id number]  PCP:  Irene Pap PA-C  Cardiologist:  Jaquelinne Glendening  Electrophysiologist:  None   Referring MD: Marcellina Millin   Chief Complaint  Patient presents with   Palpitations    History of Present Illness:    Vanessa Copeland is a 56 y.o. female with a hx of chest pain.  She recently had an echocardiogram and was noticed to have some wall motion abnormalities.  We are asked to see her today by Briscoe Deutscher, MD for further evaluation of this abnormal wall motion.  She has been having some palpitation  Single isolated palps - like she was startled.  They come in waves.  [facility]   Walks every day .   25 min a day .   Stopped last b/c she was scared of these palpitations/ pressure   She was seen in the emergency room last night.  Labs were normal.  Troponin was negative x2.  She was still having the episode of chest discomfort at that time.  The episodes may last as long as 10-15 minutes.   BP is normally ok She does not watch her salt.  Eats bacon on occasion .   Grilled chicken.   No soup   Non smoker  Mother has HTN Brother has HTN   February 21, 2020: Vanessa Copeland is seen back for follow-up visit of her chest pain.  Echocardiogram revealed normal left ventricular systolic function.  There is a question of inferior wall motion defect. Coronary CT angiogram was performed on January 31, 2020.  Coronary calcium score was 0.  She had normal coronary arteries.  She still has some episodes of tachypalpitations .  Takes propranolol as needed. ( several times each month )  One occurred after her 1st covid vaccine . Will place a monitor Has been walking .  Has lost several lbs  We discussed a target HR for her walking - established a goal HR of 130-140.  She    July 11, 2022: Vanessa Copeland is seen today for follow-up visit.  She has a history of heart  palpitations.  She had a coronary artery CT angiogram in February, 2021.  She had normal coronary arteries.  A coronary calcium score was 0.  She has had some palpitations in the past.  Outpatient event monitor did not reveal any significant arrhythmias.  No episodes of CP  Has some fullness in her chest  Has some anxiety   Has had some foot issues.   - has not been walking as much as she would like  Stressed the importance of weight loss  Wt to day is 242 lbs.  ( Up 11 lbs from 2021)   [facility]     Past Medical History:  Diagnosis Date   Allergy    Anemia    Anxiety    Asthma    mild intermittent(when allergies are flaring)   Complication of anesthesia    Food allergy    Shellfish   H/O echocardiogram 12/2005   Trace MR, mild TR, nl L ventricular systolic function.   Heart murmur    since birth. Never has had any problems   Hepatic steatosis    per CT   History of kidney stones    Kidney stones    Morbid obesity ([place]) 04/29/2019  Obesity, unspecified    PONV (postoperative nausea and vomiting)    Vitamin D deficiency 01/2010    Past Surgical History:  Procedure Laterality Date   CESAREAN SECTION     x3   COLONOSCOPY     CYSTOSCOPY WITH RETROGRADE PYELOGRAM, URETEROSCOPY AND STENT PLACEMENT Left 07/16/2018   Procedure: CYSTOSCOPY WITH RETROGRADE PYELOGRAM, URETEROSCOPY AND STENT PLACEMENT;  Surgeon: Cleon Gustin, MD;  Location: [facility];  Service: Urology;  Laterality: Left;   EXTRACORPOREAL SHOCK WAVE LITHOTRIPSY Left 06/21/2018   Procedure: LEFT EXTRACORPOREAL SHOCK WAVE LITHOTRIPSY (ESWL);  Surgeon: Franchot Gallo, MD;  Location: [facility];  Service: Urology;  Laterality: Left;   HOLMIUM LASER APPLICATION Left 0/35/0093   Procedure: HOLMIUM LASER APPLICATION;  Surgeon: Cleon Gustin, MD;  Location: [facility];  Service: Urology;  Laterality: Left;   LUMBAR DISC SURGERY  2019   OOPHORECTOMY  2002   left due to cyst   POLYPECTOMY      TUBAL LIGATION  1996    Current Medications: Current Meds  Medication Sig   albuterol (VENTOLIN HFA) 108 (90 Base) MCG/ACT inhaler Inhale 2 puffs into the lungs every 6 (six) hours as needed for wheezing or shortness of breath.   EPINEPHrine 0.3 mg/0.3 mL IJ SOAJ injection Inject 0.3 mg into the muscle as needed for anaphylaxis.   fluticasone (FLONASE) 50 MCG/ACT nasal spray Place 2 sprays into both nostrils daily.   ibuprofen (ADVIL) 800 MG tablet TAKE 1 TABLET(800 MG) BY MOUTH EVERY 8 HOURS AS NEEDED FOR JAW PAIN   levocetirizine (XYZAL) 5 MG tablet Take 1 tablet (5 mg total) by mouth every evening.   levonorgestrel (MIRENA) 20 MCG/24HR IUD 1 each by Intrauterine route once.   meloxicam (MOBIC) 15 MG tablet Take 1 tablet (15 mg total) by mouth daily as needed for pain.   omeprazole (PRILOSEC) 40 MG capsule TAKE 1 CAPSULE (40 MG TOTAL) BY MOUTH DAILY.   rosuvastatin (CRESTOR) 10 MG tablet Take Crestor daily   Semaglutide-Weight Management (WEGOVY) 1.7 MG/0.75ML SOAJ Inject 1.7 mg into the skin once a week.   Vitamin D, Ergocalciferol, (DRISDOL) 1.25 MG (50000 UNIT) CAPS capsule Take 1 capsule (50,000 Units total) by mouth every 7 (seven) days.   [DISCONTINUED] propranolol (INDERAL) 10 MG tablet Take 1 tablet (10 mg total) by mouth 4 (four) times daily as needed (palpitations/fast heart rate).   Current Facility-Administered Medications for the 07/11/22 encounter (Office Visit) with Dhani Dannemiller, Wonda Cheng, MD  Medication   triamcinolone acetonide (KENALOG) 10 MG/ML injection 10 mg   triamcinolone acetonide (KENALOG) 10 MG/ML injection 10 mg     Allergies:   Shellfish allergy   Social History   Socioeconomic History   Marital status: Married    Spouse name: Sibbie Flammia   Number of children: 3   Years of education: Not on file   Highest education level: Not on file  Occupational History   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[employer]    Employer: [employer]  Tobacco Use    Smoking status: Never   Smokeless tobacco: Never  Vaping Use   Vaping Use: Never used  Substance and Sexual Activity   Alcohol use: No   Drug use: No   Sexual activity: Yes    Partners: Male    Birth control/protection: Surgical  Other Topics Concern   Not on file  Social History Narrative   Lives with husband, 2/3 children.  (2 daughters).  Son lives in [place].  1 dog   Social  Determinants of Health   Financial Resource Strain: Not on file  Food Insecurity: Not on file  Transportation Needs: Not on file  Physical Activity: Not on file  Stress: Not on file  Social Connections: Not on file     Family History: The patient's family history includes Allergies in her daughter; Anemia in her mother; Anxiety disorder in her mother; Asthma in her brother, daughter, and daughter; Cancer in her father; Colon polyps in her mother; Hypertension in her brother, father, and mother; Kidney Stones in her father; Lung cancer (age of onset: 38) in her father. There is no history of Breast cancer, Colon cancer, Diabetes, Heart disease, Esophageal cancer, Rectal cancer, or Stomach cancer.  ROS:   Please see the history of present illness.     All other systems reviewed and are negative.  EKGs/Labs/Other Studies Reviewed:    The following studies were reviewed today:    Recent Labs: 05/20/2022: ALT 15; BUN 14; Creatinine, Ser 0.76; Hemoglobin 11.5; Platelets 150; Potassium 4.2; Sodium 141; TSH 2.350  Recent Lipid Panel    Component Value Date/Time   CHOL 188 05/20/2022 0828   TRIG 77 05/20/2022 0828   HDL 43 05/20/2022 0828   CHOLHDL 4.0 08/29/2021 0908   LDLCALC 131 (H) 05/20/2022 0828    Physical Exam:     Physical Exam: Blood pressure 128/78, pulse 84, height '5\' 3"'$  (1.6 m), weight 242 lb 3.2 oz (109.9 kg), SpO2 99 %.  GEN:  Well nourished, well developed in no acute distress HEENT: Normal NECK: No JVD; No carotid bruits LYMPHATICS: No lymphadenopathy CARDIAC: RRR  1-2 / 6  systolic murmur radiates to Left  ax line  RESPIRATORY:  Clear to auscultation without rales, wheezing or rhonchi  ABDOMEN: Soft, non-tender, non-distended MUSCULOSKELETAL:  No edema; No deformity  SKIN: Warm and dry NEUROLOGIC:  Alert and oriented x 3   EKG:   Tach either July 11, 2022: Normal sinus rhythm at 84.  Nonspecific ST changes.  ASSESSMENT:    1. Morbid obesity ([place])   2. Nonrheumatic mitral valve regurgitation   3. Chest pain of uncertain etiology     PLAN:        Chest pressure/palpitations: Had a normal CAC score , normal coronaries by Cor CTA    2.  Obesity :   uses [employer] pal. Encouraged her to work on weight loss.  Carb reduction .    3.  Hyperlipidemia :   LDL is 131. Had a CAC score of 0  Her primary MD has started her on low dose statin. She will follow up with her primary md    Medication Adjustments/Labs and Tests Ordered: Current medicines are reviewed at length with the patient today.  Concerns regarding medicines are outlined above.  Orders Placed This Encounter  Procedures   EKG 12-Lead   Meds ordered this encounter  Medications   propranolol (INDERAL) 10 MG tablet    Sig: Take 1 tablet (10 mg total) by mouth 4 (four) times daily as needed (palpitations/fast heart rate).    Dispense:  60 tablet    Refill:  11    Patient Instructions  Medication Instructions:  Your physician recommends that you continue on your current medications as directed. Please refer to the Current Medication list given to you today. *If you need a refill on your cardiac medications before your next appointment, please call your pharmacy*   Lab Work: None Ordered   Testing/Procedures: None Ordered   Follow-Up: At [facility], you and your health needs are our priority.  As part of our continuing mission to provide you with exceptional heart care, we have created designated Provider Care Teams.  These Care Teams include your primary Cardiologist  (physician) and Advanced Practice Providers (APPs -  Physician Assistants and Nurse Practitioners) who all work together to provide you with the care you need, when you need it.  We recommend signing up for the patient portal called "MyChart".  Sign up information is provided on this After Visit Summary.  MyChart is used to connect with patients for Virtual Visits (Telemedicine).  Patients are able to view lab/test results, encounter notes, upcoming appointments, etc.  Non-urgent messages can be sent to your provider as well.   To learn more about what you can do with MyChart, go to [email or web address].    Your next appointment:   AS NEEDED    The format for your next appointment:   In Person  Provider:   Grayland Jack, MD   Other Instructions   Important Information About Sugar         Signed, Mertie Moores, MD  07/11/2022 4:57 PM    [place]

## 2022-07-11 ENCOUNTER — Encounter: Payer: Self-pay | Admitting: Cardiovascular Disease

## 2022-07-11 ENCOUNTER — Ambulatory Visit (INDEPENDENT_AMBULATORY_CARE_PROVIDER_SITE_OTHER): Payer: No Typology Code available for payment source | Admitting: Cardiovascular Disease

## 2022-07-11 ENCOUNTER — Other Ambulatory Visit (INDEPENDENT_AMBULATORY_CARE_PROVIDER_SITE_OTHER): Payer: Self-pay | Admitting: Adult Health

## 2022-07-11 DIAGNOSIS — I340 Nonrheumatic mitral (valve) insufficiency: Secondary | ICD-10-CM

## 2022-07-11 DIAGNOSIS — R079 Chest pain, unspecified: Secondary | ICD-10-CM | POA: Diagnosis not present

## 2022-07-11 DIAGNOSIS — E7849 Other hyperlipidemia: Secondary | ICD-10-CM

## 2022-07-11 MED ORDER — PROPRANOLOL HCL 10 MG PO TABS: 10.0000 mg | ORAL_TABLET | Freq: Four times a day (QID) | ORAL | 11 refills | Status: DC | PRN

## 2022-07-11 NOTE — Patient Instructions (Signed)
Medication Instructions:  Your physician recommends that you continue on your current medications as directed. Please refer to the Current Medication list given to you today. *If you need a refill on your cardiac medications before your next appointment, please call your pharmacy*   Lab Work: None Ordered   Testing/Procedures: None Ordered   Follow-Up: At [facility], you and your health needs are our priority.  As part of our continuing mission to provide you with exceptional heart care, we have created designated Provider Care Teams.  These Care Teams include your primary Cardiologist (physician) and Advanced Practice Providers (APPs -  Physician Assistants and Nurse Practitioners) who all work together to provide you with the care you need, when you need it.  We recommend signing up for the patient portal called "MyChart".  Sign up information is provided on this After Visit Summary.  MyChart is used to connect with patients for Virtual Visits (Telemedicine).  Patients are able to view lab/test results, encounter notes, upcoming appointments, etc.  Non-urgent messages can be sent to your provider as well.   To learn more about what you can do with MyChart, go to [email or web address].    Your next appointment:   AS NEEDED    The format for your next appointment:   In Person  Provider:   Grayland Jack, MD   Other Instructions   Important Information About Sugar

## 2022-07-21 ENCOUNTER — Telehealth: Payer: Self-pay | Admitting: Medical

## 2022-07-21 NOTE — Telephone Encounter (Signed)
Ive put in new order

## 2022-07-21 NOTE — Telephone Encounter (Signed)
Pt states she was referred prior to [facility] and she has an appt tomorrow but they are telling her since her insurance has changed she will need another referral put in so it can be paid for. Are you able to help with this, she was a [place] pt.

## 2022-07-22 ENCOUNTER — Encounter (INDEPENDENT_AMBULATORY_CARE_PROVIDER_SITE_OTHER): Payer: Self-pay

## 2022-07-22 ENCOUNTER — Telehealth (INDEPENDENT_AMBULATORY_CARE_PROVIDER_SITE_OTHER): Payer: Self-pay | Admitting: Adult Health

## 2022-07-22 ENCOUNTER — Encounter (INDEPENDENT_AMBULATORY_CARE_PROVIDER_SITE_OTHER): Payer: Self-pay | Admitting: Adult Health

## 2022-07-22 ENCOUNTER — Ambulatory Visit (INDEPENDENT_AMBULATORY_CARE_PROVIDER_SITE_OTHER): Payer: No Typology Code available for payment source | Admitting: Adult Health

## 2022-07-22 VITALS — BP 125/81 | HR 69 | Temp 97.9°F | Ht 63.0 in | Wt 240.0 lb

## 2022-07-22 DIAGNOSIS — Z9189 Other specified personal risk factors, not elsewhere classified: Secondary | ICD-10-CM

## 2022-07-22 DIAGNOSIS — E7849 Other hyperlipidemia: Secondary | ICD-10-CM | POA: Diagnosis not present

## 2022-07-22 DIAGNOSIS — Z6841 Body Mass Index (BMI) 40.0 and over, adult: Secondary | ICD-10-CM

## 2022-07-22 DIAGNOSIS — E559 Vitamin D deficiency, unspecified: Secondary | ICD-10-CM

## 2022-07-22 DIAGNOSIS — E669 Obesity, unspecified: Secondary | ICD-10-CM

## 2022-07-22 MED ORDER — ROSUVASTATIN CALCIUM 10 MG PO TABS: ORAL_TABLET | ORAL | 0 refills | Status: DC

## 2022-07-22 MED ORDER — VITAMIN D (ERGOCALCIFEROL) 1.25 MG (50000 UNIT) PO CAPS: 50000.0000 [IU] | ORAL_CAPSULE | ORAL | 0 refills | Status: DC

## 2022-07-22 MED ORDER — WEGOVY 1.7 MG/0.75ML ~~LOC~~ SOAJ: 1.7000 mg | SUBCUTANEOUS | 0 refills | Status: DC

## 2022-07-22 NOTE — Telephone Encounter (Signed)
Vanessa Copeland - Prior authorization approved for [name]. Effective: 07/22/2022 to 02/10/2023. Patient sent approval message via mychart.

## 2022-07-24 NOTE — Progress Notes (Signed)
Chief Complaint:   OBESITY Vanessa Copeland is here to discuss her progress with her obesity treatment plan along with follow-up of her obesity related diagnoses. Vanessa Copeland is on the Category 3 Plan and states she is following her eating plan approximately 80% of the time. Vanessa Copeland states she is walking for 25 minutes 2 times per week.  Today's visit was #: 82 Starting weight: 256 lbs Starting date: 09/06/2019 Today's weight: 240 lbs Today's date: 07/22/22 Total lbs lost to date: 16 Total lbs lost since last in-office visit: -1  Interim History:  She is walking in the evenings when weather is less hot/humid. She endorses left foot pain r/t planter fasciitis and achilles tendinitis.  She drinks: 16 ounces of water, 16 ounces of diet Pepsi, 16 ounce caramel macchiato from [employer]-250 cal a serving.  Subjective:   1. Vitamin D deficiency 05/20/2022 vitamin D level-37.8 Subtherapeutic despite once weekly Ergocalciferol.   2. Hyperlipidemia, pure Started on Crestor 10 mg-2 times a week [facility]). Been on twice weekly statin for 3 to 4 weeks. She denies myalgias.  3. At risk for constipation Related to [facility] therapy for obesity.  Assessment/Plan:   1. Vitamin D deficiency Refill - Vitamin D, Ergocalciferol, (DRISDOL) 1.25 MG (50000 UNIT) CAPS capsule; Take 1 capsule (50,000 Units total) by mouth every 7 (seven) days.  Dispense: 4 capsule; Refill: 0  2. Hyperlipidemia, pure Check labs at next office visit. Increase water intake. Refill - rosuvastatin (CRESTOR) 10 MG tablet; Take Crestor daily  Dispense: 30 tablet; Refill: 0  3. At risk for constipation Vanessa Copeland was given approximately 15 minutes of counseling today regarding prevention of constipation. She was encouraged to increase water and fiber intake.    4. Obesity, Current BMI 42.8 Fasting labs at next office visit. Refill - Semaglutide-Weight Management (WEGOVY) 1.7 MG/0.75ML SOAJ; Inject 1.7 mg into the skin once  a week.  Dispense: 3 mL; Refill: 0  Vanessa Copeland is currently in the action stage of change. As such, her goal is to continue with weight loss efforts. She has agreed to the Category 3 Plan.   Exercise goals: as is  Behavioral modification strategies: increasing lean protein intake, decreasing simple carbohydrates, meal planning and cooking strategies, keeping healthy foods in the home, better snacking choices, and keeping a strict food journal.  Vanessa Copeland has agreed to follow-up with our clinic in 4 weeks, fasting labs. She was informed of the importance of frequent follow-up visits to maximize her success with intensive lifestyle modifications for her multiple health conditions.   Objective:   Blood pressure 125/81, pulse 69, temperature 97.9 F (36.6 C), height '5\' 3"'$  (1.6 m), weight 240 lb (108.9 kg), SpO2 100 %. Body mass index is 42.51 kg/m.  General: Cooperative, alert, well developed, in no acute distress. HEENT: Conjunctivae and lids unremarkable. Cardiovascular: Regular rhythm.  Lungs: Normal work of breathing. Neurologic: No focal deficits.   Lab Results  Component Value Date   CREATININE 0.76 05/20/2022   BUN 14 05/20/2022   NA 141 05/20/2022   K 4.2 05/20/2022   CL 102 05/20/2022   CO2 25 05/20/2022   Lab Results  Component Value Date   ALT 15 05/20/2022   AST 13 05/20/2022   ALKPHOS 94 05/20/2022   BILITOT 0.5 05/20/2022   Lab Results  Component Value Date   HGBA1C 5.4 05/20/2022   HGBA1C 5.7 (H) 01/16/2022   HGBA1C 6.0 (H) 08/29/2021   HGBA1C 5.6 11/27/2020   HGBA1C 5.8 (H) 07/24/2020   Lab  Results  Component Value Date   INSULIN 11.4 05/20/2022   INSULIN 11.6 01/16/2022   INSULIN 19.4 08/29/2021   INSULIN 3.8 11/27/2020   INSULIN 8.4 07/24/2020   Lab Results  Component Value Date   TSH 2.350 05/20/2022   Lab Results  Component Value Date   CHOL 188 05/20/2022   HDL 43 05/20/2022   LDLCALC 131 (H) 05/20/2022   TRIG 77 05/20/2022   CHOLHDL 4.0  08/29/2021   Lab Results  Component Value Date   VD25OH 37.8 05/20/2022   VD25OH 35.6 01/16/2022   VD25OH 34.4 08/29/2021   Lab Results  Component Value Date   WBC 7.8 05/20/2022   HGB 11.5 05/20/2022   HCT 34.4 05/20/2022   MCV 88 05/20/2022   PLT 150 05/20/2022   Lab Results  Component Value Date   IRON 57 12/27/2019   TIBC 321 12/27/2019   FERRITIN 51 12/27/2019    Attestation Statements:   Reviewed by clinician on day of visit: allergies, medications, problem list, medical history, surgical history, family history, social history, and previous encounter notes.  I, Georgianne Fick, FNP, am acting as Location manager for Mina Marble, NP.  I have reviewed the above documentation for accuracy and completeness, and I agree with the above. -  Kelsha Older d. Beola Vasallo, NP-C

## 2022-07-30 ENCOUNTER — Encounter (INDEPENDENT_AMBULATORY_CARE_PROVIDER_SITE_OTHER): Payer: Self-pay

## 2022-08-14 ENCOUNTER — Other Ambulatory Visit (INDEPENDENT_AMBULATORY_CARE_PROVIDER_SITE_OTHER): Payer: Self-pay | Admitting: Adult Health

## 2022-08-14 DIAGNOSIS — E7849 Other hyperlipidemia: Secondary | ICD-10-CM

## 2022-08-20 ENCOUNTER — Ambulatory Visit (INDEPENDENT_AMBULATORY_CARE_PROVIDER_SITE_OTHER): Payer: No Typology Code available for payment source | Admitting: Adult Health

## 2022-08-20 ENCOUNTER — Encounter (INDEPENDENT_AMBULATORY_CARE_PROVIDER_SITE_OTHER): Payer: Self-pay | Admitting: Adult Health

## 2022-08-20 VITALS — BP 137/83 | HR 60 | Temp 97.4°F | Ht 63.0 in | Wt 238.0 lb

## 2022-08-20 DIAGNOSIS — E7849 Other hyperlipidemia: Secondary | ICD-10-CM

## 2022-08-20 DIAGNOSIS — Z6841 Body Mass Index (BMI) 40.0 and over, adult: Secondary | ICD-10-CM

## 2022-08-20 DIAGNOSIS — E669 Obesity, unspecified: Secondary | ICD-10-CM | POA: Diagnosis not present

## 2022-08-20 DIAGNOSIS — E559 Vitamin D deficiency, unspecified: Secondary | ICD-10-CM

## 2022-08-20 DIAGNOSIS — R7303 Prediabetes: Secondary | ICD-10-CM | POA: Diagnosis not present

## 2022-08-20 MED ORDER — VITAMIN D (ERGOCALCIFEROL) 1.25 MG (50000 UNIT) PO CAPS: 50000.0000 [IU] | ORAL_CAPSULE | ORAL | 0 refills | Status: DC

## 2022-08-20 MED ORDER — WEGOVY 1.7 MG/0.75ML ~~LOC~~ SOAJ: 1.7000 mg | SUBCUTANEOUS | 0 refills | Status: DC

## 2022-08-20 MED ORDER — ROSUVASTATIN CALCIUM 10 MG PO TABS: ORAL_TABLET | ORAL | 0 refills | Status: DC

## 2022-08-21 LAB — COMPREHENSIVE METABOLIC PANEL
ALT: 17 IU/L (ref 0–32)
AST: 14 IU/L (ref 0–40)
Albumin/Globulin Ratio: 1.5 (ref 1.2–2.2)
Albumin: 4.4 g/dL (ref 3.8–4.9)
Alkaline Phosphatase: 90 IU/L (ref 44–121)
BUN/Creatinine Ratio: 13 (ref 9–23)
BUN: 10 mg/dL (ref 6–24)
Bilirubin Total: 0.7 mg/dL (ref 0.0–1.2)
CO2: 26 mmol/L (ref 20–29)
Calcium: 9.5 mg/dL (ref 8.7–10.2)
Chloride: 102 mmol/L (ref 96–106)
Creatinine, Ser: 0.79 mg/dL (ref 0.57–1.00)
Globulin, Total: 2.9 g/dL (ref 1.5–4.5)
Glucose: 83 mg/dL (ref 70–99)
Potassium: 4.2 mmol/L (ref 3.5–5.2)
Sodium: 141 mmol/L (ref 134–144)
Total Protein: 7.3 g/dL (ref 6.0–8.5)
eGFR: 89 mL/min/{1.73_m2} (ref >59)

## 2022-08-21 LAB — LIPID PANEL
Chol/HDL Ratio: 3.4 ratio (ref 0.0–4.4)
Cholesterol, Total: 156 mg/dL (ref 100–199)
HDL: 46 mg/dL (ref >39)
LDL Chol Calc (NIH): 96 mg/dL (ref 0–99)
Triglycerides: 74 mg/dL (ref 0–149)
VLDL Cholesterol Cal: 14 mg/dL (ref 5–40)

## 2022-08-21 LAB — HEMOGLOBIN A1C
Est. average glucose Bld gHb Est-mCnc: 123 mg/dL
Hgb A1c MFr Bld: 5.9 % — ABNORMAL HIGH (ref 4.8–5.6)

## 2022-08-21 LAB — INSULIN, RANDOM: INSULIN: 18.7 u[IU]/mL (ref 2.6–24.9)

## 2022-08-21 LAB — VITAMIN D 25 HYDROXY (VIT D DEFICIENCY, FRACTURES): Vit D, 25-Hydroxy: 52.1 ng/mL (ref 30.0–100.0)

## 2022-08-23 NOTE — Progress Notes (Unsigned)
Chief Complaint:   OBESITY Vanessa Copeland is here to discuss her progress with her obesity treatment plan along with follow-up of her obesity related diagnoses. Vanessa Copeland is on the Category 3 Plan and states she is following her eating plan approximately 80% of the time. Vanessa Copeland states she is walking 25 minutes 2 times per week.  Today's visit was #: 3 Starting weight: 256 lbs Starting date: 09/06/2019 Today's weight: 238 lbs Today's date: 08/20/2022 Total lbs lost to date: 18 lbs Total lbs lost since last in-office visit: 2 lbs  Interim History: tolerating Wegovy 1.7 mg once weekly injection.  She is planning on joining the local [employer].  Evening workout.   Subjective:   1. Vitamin D deficiency 05/20/2022, Vitamin d level 37.8. On weekly ergocalciferol.   2. Other hyperlipidemia 05/20/2022, lipid panel LDL-131.  Has been on twice weekly statin therapy, Crestor 10 mg. (Wednesday and Saturday) denies myalgia's.   3. Prediabetes On once weekly Wegovy 1.7 mg therapy.   Assessment/Plan:   1. Vitamin D deficiency Refill - Vitamin D, Ergocalciferol, (DRISDOL) 1.25 MG (50000 UNIT) CAPS capsule; Take 1 capsule (50,000 Units total) by mouth every 7 (seven) days.  Dispense: 4 capsule; Refill: 0  Check labs  - VITAMIN D 25 Hydroxy (Vit-D Deficiency, Fractures)  2. Other hyperlipidemia Refill - rosuvastatin (CRESTOR) 10 MG tablet; Take Crestor daily  Dispense: 30 tablet; Refill: 0  Check labs  - Lipid panel  3. Prediabetes Check labs  - Comprehensive metabolic panel - Hemoglobin A1c - Insulin, random  4. Obesity, Current BMI 42.3 Refill - Semaglutide-Weight Management (WEGOVY) 1.7 MG/0.75ML SOAJ; Inject 1.7 mg into the skin once a week.  Dispense: 3 mL; Refill: 0  Vanessa Copeland is currently in the action stage of change. As such, her goal is to continue with weight loss efforts. She has agreed to the Category 3 Plan.   Exercise goals:  As is.   Behavioral modification strategies:  increasing lean protein intake, decreasing simple carbohydrates, meal planning and cooking strategies, keeping healthy foods in the home, and planning for success.  Vanessa Copeland has agreed to follow-up with our clinic in 4 weeks. She was informed of the importance of frequent follow-up visits to maximize her success with intensive lifestyle modifications for her multiple health conditions.   Vanessa Copeland was informed we would discuss her lab results at her next visit unless there is a critical issue that needs to be addressed sooner. Vanessa Copeland agreed to keep her next visit at the agreed upon time to discuss these results.  Objective:   Blood pressure 137/83, pulse 60, temperature (!) 97.4 F (36.3 C), height '5\' 3"'$  (1.6 m), weight 238 lb (108 kg), SpO2 100 %. Body mass index is 42.16 kg/m.  General: Cooperative, alert, well developed, in no acute distress. HEENT: Conjunctivae and lids unremarkable. Cardiovascular: Regular rhythm.  Lungs: Normal work of breathing. Neurologic: No focal deficits.   Lab Results  Component Value Date   CREATININE 0.79 08/20/2022   BUN 10 08/20/2022   NA 141 08/20/2022   K 4.2 08/20/2022   CL 102 08/20/2022   CO2 26 08/20/2022   Lab Results  Component Value Date   ALT 17 08/20/2022   AST 14 08/20/2022   ALKPHOS 90 08/20/2022   BILITOT 0.7 08/20/2022   Lab Results  Component Value Date   HGBA1C 5.9 (H) 08/20/2022   HGBA1C 5.4 05/20/2022   HGBA1C 5.7 (H) 01/16/2022   HGBA1C 6.0 (H) 08/29/2021   HGBA1C 5.6 11/27/2020  Lab Results  Component Value Date   INSULIN 18.7 08/20/2022   INSULIN 11.4 05/20/2022   INSULIN 11.6 01/16/2022   INSULIN 19.4 08/29/2021   INSULIN 3.8 11/27/2020   Lab Results  Component Value Date   TSH 2.350 05/20/2022   Lab Results  Component Value Date   CHOL 156 08/20/2022   HDL 46 08/20/2022   LDLCALC 96 08/20/2022   TRIG 74 08/20/2022   CHOLHDL 3.4 08/20/2022   Lab Results  Component Value Date   VD25OH 52.1 08/20/2022    VD25OH 37.8 05/20/2022   VD25OH 35.6 01/16/2022   Lab Results  Component Value Date   WBC 7.8 05/20/2022   HGB 11.5 05/20/2022   HCT 34.4 05/20/2022   MCV 88 05/20/2022   PLT 150 05/20/2022   Lab Results  Component Value Date   IRON 57 12/27/2019   TIBC 321 12/27/2019   FERRITIN 51 12/27/2019   Attestation Statements:   Reviewed by clinician on day of visit: allergies, medications, problem list, medical history, surgical history, family history, social history, and previous encounter notes.  I, Davy Pique, RMA, am acting as Location manager for Mina Marble, NP.  I have reviewed the above documentation for accuracy and completeness, and I agree with the above. -  ***

## 2022-08-27 ENCOUNTER — Encounter: Payer: Self-pay | Admitting: Internal Medicine

## 2022-09-03 ENCOUNTER — Other Ambulatory Visit: Payer: Self-pay | Admitting: Family Medicine

## 2022-09-03 NOTE — Telephone Encounter (Signed)
[facility] is requesting to fill pt ibuprofen. Please advise [facility]

## 2022-09-15 ENCOUNTER — Other Ambulatory Visit (HOSPITAL_COMMUNITY): Payer: Self-pay

## 2022-09-15 ENCOUNTER — Ambulatory Visit (INDEPENDENT_AMBULATORY_CARE_PROVIDER_SITE_OTHER): Payer: No Typology Code available for payment source | Admitting: Adult Health

## 2022-09-15 ENCOUNTER — Encounter (INDEPENDENT_AMBULATORY_CARE_PROVIDER_SITE_OTHER): Payer: Self-pay | Admitting: Adult Health

## 2022-09-15 VITALS — BP 113/75 | HR 65 | Temp 97.5°F | Ht 63.0 in | Wt 237.0 lb

## 2022-09-15 DIAGNOSIS — E669 Obesity, unspecified: Secondary | ICD-10-CM

## 2022-09-15 DIAGNOSIS — E7849 Other hyperlipidemia: Secondary | ICD-10-CM | POA: Diagnosis not present

## 2022-09-15 DIAGNOSIS — Z6841 Body Mass Index (BMI) 40.0 and over, adult: Secondary | ICD-10-CM

## 2022-09-15 DIAGNOSIS — E66813 Obesity, class 3: Secondary | ICD-10-CM

## 2022-09-15 DIAGNOSIS — R7303 Prediabetes: Secondary | ICD-10-CM | POA: Diagnosis not present

## 2022-09-15 DIAGNOSIS — E559 Vitamin D deficiency, unspecified: Secondary | ICD-10-CM | POA: Diagnosis not present

## 2022-09-15 MED ORDER — ROSUVASTATIN CALCIUM 10 MG PO TABS
ORAL_TABLET | ORAL | 0 refills | Status: DC
  Filled 2022-09-15: qty 90, 90d supply, fill #0

## 2022-09-15 MED ORDER — ROSUVASTATIN CALCIUM 10 MG PO TABS
10.0000 mg | ORAL_TABLET | ORAL | 0 refills | Status: DC
  Filled 2022-09-15 (×2): qty 24, 84d supply, fill #0

## 2022-09-15 MED ORDER — VITAMIN D (ERGOCALCIFEROL) 1.25 MG (50000 UNIT) PO CAPS
50000.0000 [IU] | ORAL_CAPSULE | ORAL | 0 refills | Status: DC
  Filled 2022-09-15: qty 4, 28d supply, fill #0

## 2022-09-15 MED ORDER — WEGOVY 1.7 MG/0.75ML ~~LOC~~ SOAJ
1.7000 mg | SUBCUTANEOUS | 0 refills | Status: DC
  Filled 2022-09-15: qty 3, 28d supply, fill #0

## 2022-09-15 NOTE — Progress Notes (Unsigned)
Chief Complaint:   OBESITY Vanessa Copeland is here to discuss her progress with her obesity treatment plan along with follow-up of her obesity related diagnoses. Vanessa Copeland is on the Category 3 Plan and states she is following her eating plan approximately 80% of the time. Vanessa Copeland states she is walking 30 minutes 2 times per week.  Today's visit was #: 21 Starting weight: 256 lbs Starting date: 09/06/2019 Today's weight: 237 lbs Today's date: 09/15/2022 Total lbs lost to date: 19 lbs Total lbs lost since last in-office visit: 1 lb  Interim History: Wegovy 1.7 mg once weekly injections.  Recently purchased a home treadmill, wants to walk nightly.    Subjective:   1. Vitamin D deficiency Discussed labs with patient today. 08/20/2022, Vitamin D  level 52.1, stable.   2. Other hyperlipidemia Discussed labs with patient today. 08/20/2022, lipid panel.  Great decrease in LDL.  CMP, LFT, normal.  06/19/2022, started on twice weekly Crestor 10 mg.   The 10-year ASCVD risk score (Arnett DK, et al., 2019) is: 3%   Values used to calculate the score:     Age: 56 years     Sex: Female     Is Non-Hispanic African American: Yes     Diabetic: No     Tobacco smoker: No     Systolic Blood Pressure: 379 mmHg     Is BP treated: Yes     HDL Cholesterol: 46 mg/dL     Total Cholesterol: 156 mg/dL  3. Prediabetes Worsening.  Discussed labs with patient today. A1c *** She is on once weekly Wegovy 1.7 mg.   Assessment/Plan:   1. Vitamin D deficiency Refill -  Vitamin D, Ergocalciferol, (DRISDOL) 1.25 MG (50000 UNIT) CAPS capsule; Take 1 capsule (50,000 Units total) by mouth every 7 (seven) days.  Dispense: 4 capsule; Refill: 0  2. Other hyperlipidemia Refill - rosuvastatin (CRESTOR) 10 MG tablet; Take 1 tablet (10 mg total) by mouth 2 (two) times a week, Wednesday & Sunday.  Dispense: 30 tablet; Refill: 0  3. Prediabetes Vanessa Copeland is to decrease her sugar and carbohydrate intake and increase protein  intake.   4. Obesity, Current BMI 42.1 1) measure out salad dressing and account in snack calories.   Refill - Semaglutide-Weight Management (WEGOVY) 1.7 MG/0.75ML SOAJ; Inject 1.7 mg into the skin once a week.  Dispense: 3 mL; Refill: 0  Vanessa Copeland is currently in the action stage of change. As such, her goal is to continue with weight loss efforts. She has agreed to the Category 3 Plan.   Exercise goals:  As is.   Behavioral modification strategies: increasing lean protein intake, decreasing simple carbohydrates, meal planning and cooking strategies, keeping healthy foods in the home, and planning for success.  Vanessa Copeland has agreed to follow-up with our clinic in 4 weeks. She was informed of the importance of frequent follow-up visits to maximize her success with intensive lifestyle modifications for her multiple health conditions.   Objective:   Blood pressure 113/75, pulse 65, temperature (!) 97.5 F (36.4 C), height '5\' 3"'$  (1.6 m), weight 237 lb (107.5 kg), SpO2 99 %. Body mass index is 41.98 kg/m.  General: Cooperative, alert, well developed, in no acute distress. HEENT: Conjunctivae and lids unremarkable. Cardiovascular: Regular rhythm.  Lungs: Normal work of breathing. Neurologic: No focal deficits.   Lab Results  Component Value Date   CREATININE 0.79 08/20/2022   BUN 10 08/20/2022   NA 141 08/20/2022   K 4.2 08/20/2022  CL 102 08/20/2022   CO2 26 08/20/2022   Lab Results  Component Value Date   ALT 17 08/20/2022   AST 14 08/20/2022   ALKPHOS 90 08/20/2022   BILITOT 0.7 08/20/2022   Lab Results  Component Value Date   HGBA1C 5.9 (H) 08/20/2022   HGBA1C 5.4 05/20/2022   HGBA1C 5.7 (H) 01/16/2022   HGBA1C 6.0 (H) 08/29/2021   HGBA1C 5.6 11/27/2020   Lab Results  Component Value Date   INSULIN 18.7 08/20/2022   INSULIN 11.4 05/20/2022   INSULIN 11.6 01/16/2022   INSULIN 19.4 08/29/2021   INSULIN 3.8 11/27/2020   Lab Results  Component Value Date   TSH  2.350 05/20/2022   Lab Results  Component Value Date   CHOL 156 08/20/2022   HDL 46 08/20/2022   LDLCALC 96 08/20/2022   TRIG 74 08/20/2022   CHOLHDL 3.4 08/20/2022   Lab Results  Component Value Date   VD25OH 52.1 08/20/2022   VD25OH 37.8 05/20/2022   VD25OH 35.6 01/16/2022   Lab Results  Component Value Date   WBC 7.8 05/20/2022   HGB 11.5 05/20/2022   HCT 34.4 05/20/2022   MCV 88 05/20/2022   PLT 150 05/20/2022   Lab Results  Component Value Date   IRON 57 12/27/2019   TIBC 321 12/27/2019   FERRITIN 51 12/27/2019    Attestation Statements:   Reviewed by clinician on day of visit: allergies, medications, problem list, medical history, surgical history, family history, social history, and previous encounter notes.  I, Davy Pique, RMA, am acting as Location manager for Mina Marble, NP.  I have reviewed the above documentation for accuracy and completeness, and I agree with the above. -  ***

## 2022-10-03 ENCOUNTER — Encounter: Payer: BC Managed Care – PPO | Admitting: Medical

## 2022-10-08 ENCOUNTER — Other Ambulatory Visit: Payer: Self-pay | Admitting: Physician Assistant

## 2022-10-08 ENCOUNTER — Other Ambulatory Visit (HOSPITAL_COMMUNITY): Payer: Self-pay

## 2022-10-08 DIAGNOSIS — Z91013 Allergy to seafood: Secondary | ICD-10-CM

## 2022-10-08 MED FILL — Ibuprofen Tab 800 MG: ORAL | 10 days supply | Qty: 30 | Fill #0 | Status: AC

## 2022-10-09 ENCOUNTER — Other Ambulatory Visit (HOSPITAL_COMMUNITY): Payer: Self-pay

## 2022-10-09 MED ORDER — EPINEPHRINE 0.3 MG/0.3ML IJ SOAJ: 0.3000 mg | INTRAMUSCULAR | 0 refills | Status: DC | PRN

## 2022-10-09 NOTE — Telephone Encounter (Signed)
Pt has an appt in November

## 2022-10-10 LAB — HM MAMMOGRAPHY

## 2022-10-15 ENCOUNTER — Encounter: Payer: Self-pay | Admitting: Internal Medicine

## 2022-10-16 ENCOUNTER — Ambulatory Visit (INDEPENDENT_AMBULATORY_CARE_PROVIDER_SITE_OTHER): Payer: No Typology Code available for payment source | Admitting: Adult Health

## 2022-10-16 ENCOUNTER — Encounter (INDEPENDENT_AMBULATORY_CARE_PROVIDER_SITE_OTHER): Payer: Self-pay | Admitting: Adult Health

## 2022-10-16 ENCOUNTER — Other Ambulatory Visit (HOSPITAL_COMMUNITY): Payer: Self-pay

## 2022-10-16 VITALS — BP 129/82 | HR 67 | Temp 97.5°F | Ht 63.0 in | Wt 237.0 lb

## 2022-10-16 DIAGNOSIS — E559 Vitamin D deficiency, unspecified: Secondary | ICD-10-CM

## 2022-10-16 DIAGNOSIS — E669 Obesity, unspecified: Secondary | ICD-10-CM | POA: Diagnosis not present

## 2022-10-16 DIAGNOSIS — R002 Palpitations: Secondary | ICD-10-CM | POA: Diagnosis not present

## 2022-10-16 DIAGNOSIS — Z6841 Body Mass Index (BMI) 40.0 and over, adult: Secondary | ICD-10-CM

## 2022-10-16 MED ORDER — VITAMIN D (ERGOCALCIFEROL) 1.25 MG (50000 UNIT) PO CAPS
50000.0000 [IU] | ORAL_CAPSULE | ORAL | 0 refills | Status: DC
  Filled 2022-10-16: qty 4, 28d supply, fill #0

## 2022-10-16 MED ORDER — WEGOVY 1.7 MG/0.75ML ~~LOC~~ SOAJ
1.7000 mg | SUBCUTANEOUS | 0 refills | Status: DC
  Filled 2022-10-16: qty 3, 28d supply, fill #0

## 2022-10-20 NOTE — Progress Notes (Unsigned)
Chief Complaint:   OBESITY Vanessa Copeland is here to discuss her progress with her obesity treatment plan along with follow-up of her obesity related diagnoses. Vanessa Copeland is on the Category 3 Plan and states she is following her eating plan approximately 80% of the time. Vanessa Copeland states she is walking  25 minutes 2 times per week.  Today's visit was #: 62 Starting weight: 256 lbs Starting date: 09/06/2019 Today's weight: 237 lbs Today's date: 10/16/2022 Total lbs lost to date: 19 lbs Total lbs lost since last in-office visit: 0  Interim History: She endorses needing to consume more protein each day.  She has a home treadmill, but due to fatigue after work, she is unable to walk at night.  She will walk the neighborhood on Monday and Wednesday evenings after work.   Subjective:   1. Vitamin D deficiency She is currently taking prescription vitamin D 50,000 IU each week. She denies nausea, vomiting or muscle weakness. 08/20/2022, Vitamin D level, 52.1.   2. Palpitations Dr Nasler/Cardiology provided her with propranolol prescription to treat palpitations or rapid heart rate per patient. She has not used a beta blocker this year.   Assessment/Plan:   1. Vitamin D deficiency Refill - Vitamin D, Ergocalciferol, (DRISDOL) 1.25 MG (50000 UNIT) CAPS capsule; Take 1 capsule (50,000 Units total) by mouth every 7 (seven) days.  Dispense: 4 capsule; Refill: 0  2. Palpitations She has agreed to limit caffeine and continue regular exercises.  Continue propranolol per DR Nasler.   3. Obesity, Current BMI 42.1 Check labs in December.   Refill - Semaglutide-Weight Management (WEGOVY) 1.7 MG/0.75ML SOAJ; Inject 1.7 mg into the skin once a week.  Dispense: 3 mL; Refill: 0  Vanessa Copeland is currently in the action stage of change. As such, her goal is to continue with weight loss efforts. She has agreed to the Category 3 Plan.   Exercise goals:  Monday-Wednesday, go for an evening walk outside, Tuesday-Friday,  go for a morning walk on treadmill.   Behavioral modification strategies: increasing lean protein intake, decreasing simple carbohydrates, meal planning and cooking strategies, keeping healthy foods in the home, and planning for success.  Vanessa Copeland has agreed to follow-up with our clinic in 4 weeks. She was informed of the importance of frequent follow-up visits to maximize her success with intensive lifestyle modifications for her multiple health conditions.   Objective:   Blood pressure 129/82, pulse 67, temperature (!) 97.5 F (36.4 C), height '5\' 3"'$  (1.6 m), weight 237 lb (107.5 kg), SpO2 100 %. Body mass index is 41.98 kg/m.  General: Cooperative, alert, well developed, in no acute distress. HEENT: Conjunctivae and lids unremarkable. Cardiovascular: Regular rhythm.  Lungs: Normal work of breathing. Neurologic: No focal deficits.   Lab Results  Component Value Date   CREATININE 0.79 08/20/2022   BUN 10 08/20/2022   NA 141 08/20/2022   K 4.2 08/20/2022   CL 102 08/20/2022   CO2 26 08/20/2022   Lab Results  Component Value Date   ALT 17 08/20/2022   AST 14 08/20/2022   ALKPHOS 90 08/20/2022   BILITOT 0.7 08/20/2022   Lab Results  Component Value Date   HGBA1C 5.9 (H) 08/20/2022   HGBA1C 5.4 05/20/2022   HGBA1C 5.7 (H) 01/16/2022   HGBA1C 6.0 (H) 08/29/2021   HGBA1C 5.6 11/27/2020   Lab Results  Component Value Date   INSULIN 18.7 08/20/2022   INSULIN 11.4 05/20/2022   INSULIN 11.6 01/16/2022   INSULIN 19.4 08/29/2021  INSULIN 3.8 11/27/2020   Lab Results  Component Value Date   TSH 2.350 05/20/2022   Lab Results  Component Value Date   CHOL 156 08/20/2022   HDL 46 08/20/2022   LDLCALC 96 08/20/2022   TRIG 74 08/20/2022   CHOLHDL 3.4 08/20/2022   Lab Results  Component Value Date   VD25OH 52.1 08/20/2022   VD25OH 37.8 05/20/2022   VD25OH 35.6 01/16/2022   Lab Results  Component Value Date   WBC 7.8 05/20/2022   HGB 11.5 05/20/2022   HCT 34.4  05/20/2022   MCV 88 05/20/2022   PLT 150 05/20/2022   Lab Results  Component Value Date   IRON 57 12/27/2019   TIBC 321 12/27/2019   FERRITIN 51 12/27/2019    Attestation Statements:   Reviewed by clinician on day of visit: allergies, medications, problem list, medical history, surgical history, family history, social history, and previous encounter notes.  I, Davy Pique, RMA, am acting as Location manager for Mina Marble, NP.  I have reviewed the above documentation for accuracy and completeness, and I agree with the above. -  ***

## 2022-10-31 ENCOUNTER — Encounter: Payer: Self-pay | Admitting: Medical

## 2022-10-31 ENCOUNTER — Ambulatory Visit (INDEPENDENT_AMBULATORY_CARE_PROVIDER_SITE_OTHER): Payer: No Typology Code available for payment source | Admitting: Medical

## 2022-10-31 VITALS — BP 110/70 | HR 74 | Ht 63.5 in | Wt 239.6 lb

## 2022-10-31 DIAGNOSIS — E66813 Obesity, class 3: Secondary | ICD-10-CM

## 2022-10-31 DIAGNOSIS — Z23 Encounter for immunization: Secondary | ICD-10-CM | POA: Diagnosis not present

## 2022-10-31 DIAGNOSIS — Z975 Presence of (intrauterine) contraceptive device: Secondary | ICD-10-CM | POA: Diagnosis not present

## 2022-10-31 DIAGNOSIS — J4520 Mild intermittent asthma, uncomplicated: Secondary | ICD-10-CM

## 2022-10-31 DIAGNOSIS — M722 Plantar fascial fibromatosis: Secondary | ICD-10-CM

## 2022-10-31 DIAGNOSIS — E559 Vitamin D deficiency, unspecified: Secondary | ICD-10-CM

## 2022-10-31 DIAGNOSIS — Z87442 Personal history of urinary calculi: Secondary | ICD-10-CM

## 2022-10-31 DIAGNOSIS — Z6841 Body Mass Index (BMI) 40.0 and over, adult: Secondary | ICD-10-CM

## 2022-10-31 DIAGNOSIS — Z0000 Encounter for general adult medical examination without abnormal findings: Secondary | ICD-10-CM

## 2022-10-31 DIAGNOSIS — E7849 Other hyperlipidemia: Secondary | ICD-10-CM

## 2022-10-31 DIAGNOSIS — Z91013 Allergy to seafood: Secondary | ICD-10-CM

## 2022-10-31 DIAGNOSIS — J309 Allergic rhinitis, unspecified: Secondary | ICD-10-CM

## 2022-10-31 DIAGNOSIS — R7303 Prediabetes: Secondary | ICD-10-CM

## 2022-10-31 DIAGNOSIS — K760 Fatty (change of) liver, not elsewhere classified: Secondary | ICD-10-CM

## 2022-10-31 LAB — POCT URINALYSIS DIP (PROADVANTAGE DEVICE)
Bilirubin, UA: NEGATIVE
Glucose, UA: NEGATIVE mg/dL
Ketones, POC UA: NEGATIVE mg/dL
Leukocytes, UA: NEGATIVE
Nitrite, UA: NEGATIVE
Protein Ur, POC: NEGATIVE mg/dL
Specific Gravity, Urine: 1.025
Urobilinogen, Ur: NEGATIVE
pH, UA: 6 (ref 5.0–8.0)

## 2022-10-31 NOTE — Progress Notes (Signed)
Subjective:   HPI  Vanessa Copeland is a 56 y.o. female who presents for Chief Complaint  Patient presents with   fasting cpe    Fasting cpe, would like pap done with [name],. Possible referral for removable of IUD as its been 10 years.     Patient Care Team: Early, Coralee Pesa, NP as PCP - General (Nurse Practitioner) Sees dentist Sees eye doctor Dr. Mertie Moores, cardiology Mina Marble, NP, health weight and wellness Dr. Celesta Gentile, podiatry Dr. Audelia Hives, plastic surgery Dr. Stormy Card, GI Dr. Link Snuffer, urology   Concerns: Sees healthy weight wellness clinic for weight loss medication Wegovy..  She is exercising 20 minutes on 4 days/week.  Uses treadmill.  No current weightlifting or resistance training.  She started her weight loss journey at 256 pounds when she started [facility] beginning of this year  She gets Planter fasciitis issues from time to time, sees podiatry.  Uses meloxicam periodically.  No recent asthma concerns  Her Mirena needs to be removed.  Is been in place over 10 years but probably less than 15 years.  This was placed by [facility] gynecology but that provider is no longer there  History of palpitations, has used propranolol in the past but not currently using this in the last year  She carries an EpiPen  Reviewed their medical, surgical, family, social, medication, and allergy history and updated chart as appropriate.  Past Medical History:  Diagnosis Date   Allergy    Anemia    Anxiety    Asthma    mild intermittent(when allergies are flaring)   Complication of anesthesia    Food allergy    Shellfish   H/O echocardiogram 12/2005   Trace MR, mild TR, nl L ventricular systolic function.   Heart murmur    since birth. Never has had any problems   Hepatic steatosis    per CT   History of kidney stones    Kidney stones    Morbid obesity ([place]) 04/29/2019   Obesity, unspecified    PONV (postoperative nausea and vomiting)     Vitamin D deficiency 01/2010    Family History  Problem Relation Age of Onset   Hypertension Mother    Anemia Mother    Colon polyps Mother    Anxiety disorder Mother    Cancer Father        lung   Kidney Stones Father    Hypertension Father    Lung cancer Father 30   Asthma Brother    Asthma Daughter    Allergies Daughter    Asthma Daughter    Hypertension Brother    Breast cancer Neg Hx    Colon cancer Neg Hx    Diabetes Neg Hx    Heart disease Neg Hx    Esophageal cancer Neg Hx    Rectal cancer Neg Hx    Stomach cancer Neg Hx      Current Outpatient Medications:    albuterol (VENTOLIN HFA) 108 (90 Base) MCG/ACT inhaler, Inhale 2 puffs into the lungs every 6 (six) hours as needed for wheezing or shortness of breath., Disp: 18 g, Rfl: 1   EPINEPHrine 0.3 mg/0.3 mL IJ SOAJ injection, Inject 0.3 mg into the muscle as needed for anaphylaxis., Disp: 1 each, Rfl: 0   fluticasone (FLONASE) 50 MCG/ACT nasal spray, Place 2 sprays into both nostrils daily., Disp: 16 g, Rfl: 6   ibuprofen (ADVIL) 800 MG tablet, Take 1 tablet (800 mg total) by mouth  every 8 (eight) hours as needed for jaw pain, Disp: 30 tablet, Rfl: 1   levocetirizine (XYZAL) 5 MG tablet, Take 1 tablet (5 mg total) by mouth every evening., Disp: 30 tablet, Rfl: 2   levonorgestrel (MIRENA) 20 MCG/24HR IUD, 1 each by Intrauterine route once., Disp: , Rfl:    meloxicam (MOBIC) 15 MG tablet, Take 1 tablet (15 mg total) by mouth daily as needed for pain., Disp: 30 tablet, Rfl: 0   propranolol (INDERAL) 10 MG tablet, Take 1 tablet (10 mg total) by mouth 4 (four) times daily as needed (palpitations/fast heart rate)., Disp: 60 tablet, Rfl: 11   rosuvastatin (CRESTOR) 10 MG tablet, Take 1 tablet (10 mg total) by mouth 2 (two) times a week, Wednesday & Sunday., Disp: 30 tablet, Rfl: 0   Semaglutide-Weight Management (WEGOVY) 1.7 MG/0.75ML SOAJ, Inject 1.7 mg into the skin once a week., Disp: 3 mL, Rfl: 0   Vitamin D,  Ergocalciferol, (DRISDOL) 1.25 MG (50000 UNIT) CAPS capsule, Take 1 capsule (50,000 Units total) by mouth every 7 (seven) days., Disp: 4 capsule, Rfl: 0  Allergies  Allergen Reactions   Shellfish Allergy Shortness Of Breath   Past Surgical History:  Procedure Laterality Date   CESAREAN SECTION     x3   COLONOSCOPY     CYSTOSCOPY WITH RETROGRADE PYELOGRAM, URETEROSCOPY AND STENT PLACEMENT Left 07/16/2018   Procedure: CYSTOSCOPY WITH RETROGRADE PYELOGRAM, URETEROSCOPY AND STENT PLACEMENT;  Surgeon: Cleon Gustin, MD;  Location: [facility];  Service: Urology;  Laterality: Left;   EXTRACORPOREAL SHOCK WAVE LITHOTRIPSY Left 06/21/2018   Procedure: LEFT EXTRACORPOREAL SHOCK WAVE LITHOTRIPSY (ESWL);  Surgeon: Franchot Gallo, MD;  Location: [facility];  Service: Urology;  Laterality: Left;   HOLMIUM LASER APPLICATION Left 2/45/8099   Procedure: HOLMIUM LASER APPLICATION;  Surgeon: Cleon Gustin, MD;  Location: [facility];  Service: Urology;  Laterality: Left;   LUMBAR DISC SURGERY  2019   OOPHORECTOMY  2002   left due to cyst   POLYPECTOMY     TUBAL LIGATION  1996    Review of Systems Constitutional: -fever, -chills, -sweats, -unexpected weight change, -decreased appetite, -fatigue Allergy: -sneezing, -itching, -congestion Dermatology: -changing moles, --rash, -lumps ENT: -runny nose, -ear pain, -sore throat, -hoarseness, -sinus pain, -teeth pain, - ringing in ears, -hearing loss, -nosebleeds Cardiology: -chest pain, -palpitations, -swelling, -difficulty breathing when lying flat, -waking up short of breath Respiratory: -cough, -shortness of breath, -difficulty breathing with exercise or exertion, -wheezing, -coughing up blood Gastroenterology: -abdominal pain, -nausea, -vomiting, -diarrhea, -constipation, -blood in stool, -changes in bowel movement, -difficulty swallowing or eating Hematology: -bleeding, -bruising  Musculoskeletal: +joint aches, -muscle aches, -joint swelling, -back pain,  -neck pain, -cramping, -changes in gait Ophthalmology: denies vision changes, eye redness, itching, discharge Urology: -burning with urination, -difficulty urinating, -blood in urine, -urinary frequency, -urgency, -incontinence Neurology: -headache, -weakness, -tingling, -numbness, -memory loss, -falls, -dizziness Psychology: -depressed mood, -agitation, -sleep problems Breast/gyn: -breast tendnerss, -discharge, -lumps, -vaginal discharge,- irregular periods, -heavy periods      05/09/2021    8:31 AM 09/11/2020   10:16 AM 02/02/2020    8:27 AM 09/06/2019    7:52 AM 10/21/2016    9:49 AM  Depression screen PHQ 2/9  Decreased Interest 0 0 0 1 0  Down, Depressed, Hopeless 0 0 0 1 0  PHQ - 2 Score 0 0 0 2 0  Altered sleeping 0   1   Tired, decreased energy 0   3   Change in appetite 0  1   Feeling bad or failure about yourself  0   0   Trouble concentrating 0   1   Moving slowly or fidgety/restless 0   0   Suicidal thoughts 0   0   PHQ-9 Score 0   8   Difficult doing work/chores    Not difficult at all        Objective:  BP 110/70   Pulse 74   Ht 5' 3.5" (1.613 m)   Wt 239 lb 9.6 oz (108.7 kg)   BMI 41.78 kg/m   General appearance: alert, no distress, WD/WN, African American female Skin: unremarkable HEENT: normocephalic, conjunctiva/corneas normal, sclerae anicteric, PERRLA, EOMi, nares patent, no discharge or erythema, pharynx normal Oral cavity: MMM, tongue normal, teeth normal Neck: supple, no lymphadenopathy, no thyromegaly, no masses, normal ROM, no bruits Chest: non tender, normal shape and expansion Heart: RRR, normal S1, S2, no murmurs Lungs: CTA bilaterally, no wheezes, rhonchi, or rales Abdomen: +bs, soft, non tender, non distended, no masses, no hepatomegaly, no splenomegaly, no bruits Back: non tender, normal ROM, no scoliosis Musculoskeletal: upper extremities non tender, no obvious deformity, normal ROM throughout, lower extremities non tender, no obvious  deformity, normal ROM throughout Extremities: no edema, no cyanosis, no clubbing Pulses: 2+ symmetric, upper and lower extremities, normal cap refill Neurological: alert, oriented x 3, CN2-12 intact, strength normal upper extremities and lower extremities, sensation normal throughout, DTRs 2+ throughout, no cerebellar signs, gait normal Psychiatric: normal affect, behavior normal, pleasant  Breast/gyn/rectal - deferred     Assessment and Plan :   Encounter Diagnoses  Name Primary?   Routine general medical examination at a health care facility Yes   IUD (intrauterine device) in place    Need for pneumococcal vaccination    Mild intermittent asthma without complication    Allergic rhinitis, unspecified seasonality, unspecified trigger    Hepatic steatosis    Plantar fasciitis    Vitamin D deficiency    Shellfish allergy    Prediabetes    Hyperlipidemia, pure    History of kidney stones    Class 3 severe obesity with serious comorbidity and body mass index (BMI) of 40.0 to 44.9 in adult, unspecified obesity type ([place])      This visit was a preventative care visit, also known as wellness visit or routine physical.   Topics typically include healthy lifestyle, diet, exercise, preventative care, vaccinations, sick and well care, proper use of emergency dept and after hours care, as well as other concerns.     Recommendations: Continue to return yearly for your annual wellness and preventative care visits.  This gives [place] a chance to discuss healthy lifestyle, exercise, vaccinations, review your chart record, and perform screenings where appropriate.  I recommend you see your eye doctor yearly for routine vision care.  I recommend you see your dentist yearly for routine dental care including hygiene visits twice yearly.   Vaccination recommendations were reviewed Immunization History  Administered Date(s) Administered   COVID-19, mRNA, vaccine(Comirnaty)12 years and older  10/17/2022   Influenza Split 10/21/2012   Influenza,inj,Quad PF,6+ Mos 10/04/2014, 10/21/2016, 09/09/2018, 09/12/2019, 09/11/2020, 09/18/2021   Influenza-Unspecified 10/17/2022   PFIZER(Purple Top)SARS-COV-2 Vaccination 02/18/2020, 03/10/2020, 12/01/2020   Pfizer Covid-19 Vaccine Bivalent Booster 17yr & up 09/18/2021   Pneumococcal Polysaccharide-23 10/31/2022   Td 12/22/2002   Tdap 02/02/2020   Zoster Recombinat (Shingrix) 02/10/2020, 12/01/2020   Counseled on the pneumococcal vaccine.  Vaccine information sheet given.  Pneumococcal vaccine PPSV23 given after consent  obtained.  We will have her sign to obtain records of her recent flu and COVID-vaccine   Screening for cancer: Colon cancer screening: I reviewed your colonoscopy on file that is up to date from 2021  Breast cancer screening: You should perform a self breast exam monthly.   We reviewed recommendations for regular mammograms and breast cancer screening.  Cervical cancer screening: Pap smear is up-to-date from 2020, negative for HPV  Skin cancer screening: Check your skin regularly for new changes, growing lesions, or other lesions of concern Come in for evaluation if you have skin lesions of concern.  Lung cancer screening: If you have a greater than 20 pack year history of tobacco use, then you may qualify for lung cancer screening with a chest CT scan.   Please call your insurance company to inquire about coverage for this test.  We currently don't have screenings for other cancers besides breast, cervical, colon, and lung cancers.  If you have a strong family history of cancer or have other cancer screening concerns, please let me know.    Bone health: Get at least 150 minutes of aerobic exercise weekly Get weight bearing exercise at least once weekly Bone density test:  A bone density test is an imaging test that uses a type of X-ray to measure the amount of calcium and other minerals in your bones. The  test may be used to diagnose or screen you for a condition that causes weak or thin bones (osteoporosis), predict your risk for a broken bone (fracture), or determine how well your osteoporosis treatment is working. The bone density test is recommended for females 9 and older, or females or males <32 if certain risk factors such as thyroid disease, long term use of steroids such as for asthma or rheumatological issues, vitamin D deficiency, estrogen deficiency, family history of osteoporosis, self or family history of fragility fracture in first degree relative.    Heart health: Get at least 150 minutes of aerobic exercise weekly Limit alcohol It is important to maintain a healthy blood pressure and healthy cholesterol numbers  Heart disease screening: Screening for heart disease includes screening for blood pressure, fasting lipids, glucose/diabetes screening, BMI height to weight ratio, reviewed of smoking status, physical activity, and diet.    Goals include blood pressure 120/80 or less, maintaining a healthy lipid/cholesterol profile, preventing diabetes or keeping diabetes numbers under good control, not smoking or using tobacco products, exercising most days per week or at least 150 minutes per week of exercise, and eating healthy variety of fruits and vegetables, healthy oils, and avoiding unhealthy food choices like fried food, fast food, high sugar and high cholesterol foods.    Other tests may possibly include EKG test, CT coronary calcium score, echocardiogram, exercise treadmill stress test.    Medical care options: I recommend you continue to seek care here first for routine care.  We try really hard to have available appointments Monday through Friday daytime hours for sick visits, acute visits, and physicals.  Urgent care should be used for after hours and weekends for significant issues that cannot wait till the next day.  The emergency department should be used for significant  potentially life-threatening emergencies.  The emergency department is expensive, can often have long wait times for less significant concerns, so try to utilize primary care, urgent care, or telemedicine when possible to avoid unnecessary trips to the emergency department.  Virtual visits and telemedicine have been introduced since the pandemic started in 2020,  and can be convenient ways to receive medical care.  We offer virtual appointments as well to assist you in a variety of options to seek medical care.   Advanced Directives: I recommend you consider completing a [place] and Living Will.   These documents respect your wishes and help alleviate burdens on your loved ones if you were to become terminally ill or be in a position to need those documents enforced.    You can complete Advanced Directives yourself, have them notarized, then have copies made for our office, for you and for anybody you feel should have them in safe keeping.  Or, you can have an attorney prepare these documents.   If you haven't updated your Last Will and Testament in a while, it may be worthwhile having an attorney prepare these documents together and save on some costs.       Separate significant issues discussed: No lab orders today as she had several labs done in the past few months.  I reviewed labs from August 20, 2022 for hemoglobin A1c, lipid, vitamin D, insulin, comprehensive metabolic.  Also reviewed labs from May 2023 for thyroid, lipid, hemoglobin A1c, CBC.  Hemoglobin A1c was 5.9% back in August.  Most recent lipid and comprehensive metabolic panel was normal.  CBC in May normal.  Obesity-continue efforts to lose weight through healthy diet and exercise.  She may want to start coming here for [facility] but declines refill for now.  She will continue seeing healthy weight and wellness for the time being.  We discussed getting more exercise including weightbearing exercise and considering  some other dietary changes as discussed  She will get on the schedule to return soon for breast and pelvic exam and IUD removal.  She prefers a female provider  Hepatic steatosis-continue efforts to lose weight through healthy diet and exercise  Vitamin D deficiency-continue supplement  Hyperlipidemia-continue statin, labs at goal recently  Asthma-no recent problems, continue albuterol as needed  History of palpitations-not currently using propanolol   Storm was seen today for fasting cpe.  Diagnoses and all orders for this visit:  Routine general medical examination at a health care facility -     POCT Urinalysis DIP (Proadvantage Device)  IUD (intrauterine device) in place  Need for pneumococcal vaccination -     Pneumococcal polysaccharide vaccine 23-valent greater than or equal to 2yo subcutaneous/IM  Mild intermittent asthma without complication  Allergic rhinitis, unspecified seasonality, unspecified trigger  Hepatic steatosis  Plantar fasciitis  Vitamin D deficiency  Shellfish allergy  Prediabetes  Hyperlipidemia, pure  History of kidney stones  Class 3 severe obesity with serious comorbidity and body mass index (BMI) of 40.0 to 44.9 in adult, unspecified obesity type ([place])    Follow-up pending labs, yearly for physical

## 2022-11-06 ENCOUNTER — Telehealth: Payer: Self-pay | Admitting: Medical

## 2022-11-06 NOTE — Telephone Encounter (Signed)
Pt asked if you can give her a call about her urine results from 10/31/22.

## 2022-11-06 NOTE — Telephone Encounter (Signed)
Spoke with patient about blood in urine. She has already had this in the past, nothing new

## 2022-11-07 ENCOUNTER — Encounter: Payer: Self-pay | Admitting: Medical

## 2022-11-12 NOTE — Progress Notes (Signed)
TeleHealth Visit:  This visit was completed with telemedicine (audio/video) technology. Vanessa Copeland has verbally consented to this TeleHealth visit. The patient is located at home, the provider is located at home. The participants in this visit include the listed provider and patient. The visit was conducted today via MyChart video.  OBESITY Vanessa Copeland is here to discuss her progress with her obesity treatment plan along with follow-up of her obesity related diagnoses.   Today's visit was # 64 Starting weight: 256 lbs Starting date: 09/06/2019 Weight at last in office visit: 237 lbs on 10/16/22 Total weight loss: 22 lbs  Weight in office on 11/17/22: 234 lbs  Nutrition Plan: the Category 3 Plan- 80%  Current exercise:  walking 20 minutes 4 times per week in am  Interim History: Vanessa Copeland feels she did well over Thanksgiving and had small portions.  She feels that her protein intake has improved.  Struggles to get in 8 ounces of protein at dinner.    She cooks dinner at home and packs lunch for work.  She does have occasional sweet tea. She has increased her walking to 4 days/week-gets up at 4 AM to walk in the morning.  She feels this is increased her energy. On Wegovy 1.7 mg weekly.-Has occasional mild nausea.  Appetite and cravings well controlled.  Assessment/Plan:  1. Prediabetes Last A1c elevated at 5.9. Medication(s): Wegovy 1.7 mg weekly. Lab Results  Component Value Date   HGBA1C 5.9 (H) 08/20/2022   Lab Results  Component Value Date   INSULIN 18.7 08/20/2022   INSULIN 11.4 05/20/2022   INSULIN 11.6 01/16/2022   INSULIN 19.4 08/29/2021   INSULIN 3.8 11/27/2020    Plan: Check A1c and fasting insulin today. Continue Wegovy 1.7 mg weekly.  2. Vitamin D Deficiency Vitamin D is at goal of 50.  She is on weekly prescription Vitamin D 50,000 IU.  Lab Results  Component Value Date   VD25OH 52.1 08/20/2022   VD25OH 37.8 05/20/2022   VD25OH 35.6 01/16/2022     Plan: Refill prescription vitamin D 50,000 IU weekly. Check vitamin D level.   3. Obesity: Current BMI 41.5 Refill Wegovy 1.7 mg weekly. Vanessa Copeland is currently in the action stage of change. As such, her goal is to continue with weight loss efforts.  She has agreed to the Category 3 Plan.   A substitute 1 snack size packet of Whisps for 2 ounces of protein. Try unsweet tea with artificial sweetener. Handouts-protein equivalents  Exercise goals: Continue walking 4 days/week and increase duration to 30 minutes.  Behavioral modification strategies: increasing lean protein intake, decreasing liquid calories, better snacking choices, and planning for success.  Vanessa Copeland has agreed to follow-up with our clinic in 3-4 weeks.   Orders Placed This Encounter  Procedures   VITAMIN D 25 Hydroxy (Vit-D Deficiency, Fractures)   Hemoglobin A1c   Insulin, random    Medications Discontinued During This Encounter  Medication Reason   Vitamin D, Ergocalciferol, (DRISDOL) 1.25 MG (50000 UNIT) CAPS capsule Reorder   Semaglutide-Weight Management (WEGOVY) 1.7 MG/0.75ML SOAJ Reorder     Meds ordered this encounter  Medications   Semaglutide-Weight Management (WEGOVY) 1.7 MG/0.75ML SOAJ    Sig: Inject 1.7 mg into the skin once a week.    Dispense:  3 mL    Refill:  0    Order Specific Question:   Supervising Provider    Answer:   Netty Starring   Vitamin D, Ergocalciferol, (DRISDOL) 1.25 MG (50000 UNIT) CAPS capsule  Sig: Take 1 capsule (50,000 Units total) by mouth every 7 (seven) days.    Dispense:  4 capsule    Refill:  0    Order Specific Question:   Supervising Provider    Answer:   Dell Ponto [[id number]]      Objective:   VITALS: Per patient if applicable, see vitals. GENERAL: Alert and in no acute distress. CARDIOPULMONARY: No increased WOB. Speaking in clear sentences.  PSYCH: Pleasant and cooperative. Speech normal rate and rhythm. Affect is appropriate. Insight and  judgement are appropriate. Attention is focused, linear, and appropriate.  NEURO: Oriented as arrived to appointment on time with no prompting.   Lab Results  Component Value Date   CREATININE 0.79 08/20/2022   BUN 10 08/20/2022   NA 141 08/20/2022   K 4.2 08/20/2022   CL 102 08/20/2022   CO2 26 08/20/2022   Lab Results  Component Value Date   ALT 17 08/20/2022   AST 14 08/20/2022   ALKPHOS 90 08/20/2022   BILITOT 0.7 08/20/2022   Lab Results  Component Value Date   HGBA1C 5.9 (H) 08/20/2022   HGBA1C 5.4 05/20/2022   HGBA1C 5.7 (H) 01/16/2022   HGBA1C 6.0 (H) 08/29/2021   HGBA1C 5.6 11/27/2020   Lab Results  Component Value Date   INSULIN 18.7 08/20/2022   INSULIN 11.4 05/20/2022   INSULIN 11.6 01/16/2022   INSULIN 19.4 08/29/2021   INSULIN 3.8 11/27/2020   Lab Results  Component Value Date   TSH 2.350 05/20/2022   Lab Results  Component Value Date   CHOL 156 08/20/2022   HDL 46 08/20/2022   LDLCALC 96 08/20/2022   TRIG 74 08/20/2022   CHOLHDL 3.4 08/20/2022   Lab Results  Component Value Date   WBC 7.8 05/20/2022   HGB 11.5 05/20/2022   HCT 34.4 05/20/2022   MCV 88 05/20/2022   PLT 150 05/20/2022   Lab Results  Component Value Date   IRON 57 12/27/2019   TIBC 321 12/27/2019   FERRITIN 51 12/27/2019   Lab Results  Component Value Date   VD25OH 52.1 08/20/2022   VD25OH 37.8 05/20/2022   VD25OH 35.6 01/16/2022    Attestation Statements:   Reviewed by clinician on day of visit: allergies, medications, problem list, medical history, surgical history, family history, social history, and previous encounter notes.

## 2022-11-17 ENCOUNTER — Telehealth (INDEPENDENT_AMBULATORY_CARE_PROVIDER_SITE_OTHER): Payer: No Typology Code available for payment source | Admitting: Family Medicine

## 2022-11-17 ENCOUNTER — Other Ambulatory Visit (HOSPITAL_COMMUNITY): Payer: Self-pay

## 2022-11-17 ENCOUNTER — Encounter (INDEPENDENT_AMBULATORY_CARE_PROVIDER_SITE_OTHER): Payer: Self-pay | Admitting: Family Medicine

## 2022-11-17 VITALS — BP 137/83 | HR 64 | Temp 97.7°F | Ht 63.0 in | Wt 234.0 lb

## 2022-11-17 DIAGNOSIS — E559 Vitamin D deficiency, unspecified: Secondary | ICD-10-CM

## 2022-11-17 DIAGNOSIS — Z6841 Body Mass Index (BMI) 40.0 and over, adult: Secondary | ICD-10-CM

## 2022-11-17 DIAGNOSIS — E669 Obesity, unspecified: Secondary | ICD-10-CM | POA: Diagnosis not present

## 2022-11-17 DIAGNOSIS — R7303 Prediabetes: Secondary | ICD-10-CM | POA: Diagnosis not present

## 2022-11-17 MED ORDER — WEGOVY 1.7 MG/0.75ML ~~LOC~~ SOAJ
1.7000 mg | SUBCUTANEOUS | 0 refills | Status: DC
  Filled 2022-11-17: qty 3, 28d supply, fill #0

## 2022-11-17 MED ORDER — VITAMIN D (ERGOCALCIFEROL) 1.25 MG (50000 UNIT) PO CAPS
50000.0000 [IU] | ORAL_CAPSULE | ORAL | 0 refills | Status: DC
  Filled 2022-11-17: qty 4, 28d supply, fill #0

## 2022-11-18 LAB — HEMOGLOBIN A1C
Est. average glucose Bld gHb Est-mCnc: 114 mg/dL
Hgb A1c MFr Bld: 5.6 % (ref 4.8–5.6)

## 2022-11-18 LAB — INSULIN, RANDOM: INSULIN: 10.8 u[IU]/mL (ref 2.6–24.9)

## 2022-11-18 LAB — VITAMIN D 25 HYDROXY (VIT D DEFICIENCY, FRACTURES): Vit D, 25-Hydroxy: 50.9 ng/mL (ref 30.0–100.0)

## 2022-12-02 ENCOUNTER — Encounter (HOSPITAL_COMMUNITY): Payer: Self-pay | Admitting: Emergency Medicine

## 2022-12-02 ENCOUNTER — Emergency Department (HOSPITAL_COMMUNITY)
Admission: EM | Admit: 2022-12-02 | Discharge: 2022-12-02 | Disposition: A | Discharge status: Home / Self Care | Payer: No Typology Code available for payment source | Attending: Student | Admitting: Student

## 2022-12-02 ENCOUNTER — Other Ambulatory Visit (HOSPITAL_COMMUNITY): Payer: Self-pay

## 2022-12-02 ENCOUNTER — Emergency Department (HOSPITAL_COMMUNITY): Payer: No Typology Code available for payment source

## 2022-12-02 DIAGNOSIS — R110 Nausea: Secondary | ICD-10-CM | POA: Insufficient documentation

## 2022-12-02 DIAGNOSIS — Z7952 Long term (current) use of systemic steroids: Secondary | ICD-10-CM | POA: Diagnosis not present

## 2022-12-02 DIAGNOSIS — R109 Unspecified abdominal pain: Secondary | ICD-10-CM | POA: Diagnosis present

## 2022-12-02 DIAGNOSIS — N200 Calculus of kidney: Secondary | ICD-10-CM | POA: Insufficient documentation

## 2022-12-02 LAB — COMPREHENSIVE METABOLIC PANEL
ALT: 19 U/L (ref 0–44)
AST: 22 U/L (ref 15–41)
Albumin: 3.7 g/dL (ref 3.5–5.0)
Alkaline Phosphatase: 73 U/L (ref 38–126)
Anion gap: 12 (ref 5–15)
BUN: 15 mg/dL (ref 6–20)
CO2: 24 mmol/L (ref 22–32)
Calcium: 9.2 mg/dL (ref 8.9–10.3)
Chloride: 106 mmol/L (ref 98–111)
Creatinine, Ser: 1.02 mg/dL — ABNORMAL HIGH (ref 0.44–1.00)
GFR, Estimated: >60 mL/min (ref >60)
Glucose, Bld: 123 mg/dL — ABNORMAL HIGH (ref 70–99)
Potassium: 3.6 mmol/L (ref 3.5–5.1)
Sodium: 142 mmol/L (ref 135–145)
Total Bilirubin: 0.5 mg/dL (ref 0.3–1.2)
Total Protein: 7.5 g/dL (ref 6.5–8.1)

## 2022-12-02 LAB — CBC WITH DIFFERENTIAL/PLATELET
Abs Immature Granulocytes: 0.07 10*3/uL (ref 0.00–0.07)
Basophils Absolute: 0 10*3/uL (ref 0.0–0.1)
Basophils Relative: 0 %
Eosinophils Absolute: 0.1 10*3/uL (ref 0.0–0.5)
Eosinophils Relative: 1 %
HCT: 34.7 % — ABNORMAL LOW (ref 36.0–46.0)
Hemoglobin: 11.3 g/dL — ABNORMAL LOW (ref 12.0–15.0)
Immature Granulocytes: 1 %
Lymphocytes Relative: 21 %
Lymphs Abs: 1.9 10*3/uL (ref 0.7–4.0)
MCH: 29.4 pg (ref 26.0–34.0)
MCHC: 32.6 g/dL (ref 30.0–36.0)
MCV: 90.1 fL (ref 80.0–100.0)
Monocytes Absolute: 0.7 10*3/uL (ref 0.1–1.0)
Monocytes Relative: 8 %
Neutro Abs: 6.4 10*3/uL (ref 1.7–7.7)
Neutrophils Relative %: 69 %
Platelets: 297 10*3/uL (ref 150–400)
RBC: 3.85 MIL/uL — ABNORMAL LOW (ref 3.87–5.11)
RDW: 15.7 % — ABNORMAL HIGH (ref 11.5–15.5)
WBC: 9.3 10*3/uL (ref 4.0–10.5)
nRBC: 0 % (ref 0.0–0.2)

## 2022-12-02 LAB — URINALYSIS, ROUTINE W REFLEX MICROSCOPIC
Bilirubin Urine: NEGATIVE
Glucose, UA: NEGATIVE mg/dL
Ketones, ur: NEGATIVE mg/dL
Leukocytes,Ua: NEGATIVE
Nitrite: NEGATIVE
Protein, ur: NEGATIVE mg/dL
RBC / HPF: >50 RBC/hpf — ABNORMAL HIGH (ref 0–5)
Specific Gravity, Urine: 1.019 (ref 1.005–1.030)
pH: 5 (ref 5.0–8.0)

## 2022-12-02 LAB — I-STAT BETA HCG BLOOD, ED (MC, WL, AP ONLY): I-stat hCG, quantitative: <5 m[IU]/mL (ref <5)

## 2022-12-02 LAB — LIPASE, BLOOD: Lipase: 29 U/L (ref 11–51)

## 2022-12-02 MED ORDER — HYDROCODONE-ACETAMINOPHEN 5-325 MG PO TABS
1.0000 | ORAL_TABLET | ORAL | 0 refills | Status: DC | PRN
  Filled 2022-12-02: qty 10, 2d supply, fill #0

## 2022-12-02 MED ORDER — NAPROXEN 375 MG PO TABS: 375.0000 mg | ORAL_TABLET | Freq: Two times a day (BID) | ORAL | 0 refills | Status: DC

## 2022-12-02 MED ORDER — KETOROLAC TROMETHAMINE 15 MG/ML IJ SOLN
15.0000 mg | Freq: Once | INTRAMUSCULAR | Status: AC
  Administered 2022-12-02: 15 mg via INTRAVENOUS
  Filled 2022-12-02: qty 1

## 2022-12-02 MED ORDER — HYDROCODONE-ACETAMINOPHEN 5-325 MG PO TABS: 1.0000 | ORAL_TABLET | ORAL | 0 refills | Status: DC | PRN

## 2022-12-02 MED ORDER — VALACYCLOVIR HCL 500 MG PO TABS: 1000.0000 mg | ORAL_TABLET | Freq: Two times a day (BID) | ORAL | Status: DC

## 2022-12-02 MED ORDER — NAPROXEN 375 MG PO TABS
375.0000 mg | ORAL_TABLET | Freq: Two times a day (BID) | ORAL | 0 refills | Status: DC
  Filled 2022-12-02: qty 20, 10d supply, fill #0

## 2022-12-02 MED ORDER — LACTATED RINGERS IV BOLUS: 1000.0000 mL | Freq: Once | INTRAVENOUS | Status: DC

## 2022-12-02 MED ORDER — TAMSULOSIN HCL 0.4 MG PO CAPS
0.4000 mg | ORAL_CAPSULE | Freq: Every day | ORAL | 0 refills | Status: DC
  Filled 2022-12-02: qty 30, 30d supply, fill #0

## 2022-12-02 MED ORDER — TAMSULOSIN HCL 0.4 MG PO CAPS: 0.4000 mg | ORAL_CAPSULE | Freq: Every day | ORAL | 0 refills | Status: DC

## 2022-12-02 MED ORDER — TAMSULOSIN HCL 0.4 MG PO CAPS: 0.4000 mg | ORAL_CAPSULE | Freq: Once | ORAL | Status: DC

## 2022-12-02 NOTE — ED Provider Triage Note (Signed)
Emergency Medicine Provider Triage Evaluation Note  Vanessa Copeland , a 56 y.o. female  was evaluated in triage.  Pt complains of right side abdominal pain onset 3 am. ?right flank Hx c-section x 3, removal of ovary (unsure which side) Review of Systems  Positive: nausea Negative: Changes in bowel or bladder habits, vomiting, fevers/chills  Physical Exam  BP (!) 186/87 (BP Location: Right Arm)   Pulse 84   Temp 97.6 F (36.4 C)   Resp 18   SpO2 97%  Gen:   Awake, no distress   Resp:  Normal effort  MSK:   Moves extremities without difficulty  Other:  No CVA tenderness  Medical Decision Making  Medically screening exam initiated at 7:08 AM.  Appropriate orders placed.  Vanessa Copeland was informed that the remainder of the evaluation will be completed by another provider, this initial triage assessment does not replace that evaluation, and the importance of remaining in the ED until their evaluation is complete.     Tacy Learn, PA-C 12/02/22 0710

## 2022-12-02 NOTE — Discharge Instructions (Addendum)
Follow-up with urologist, come back to the ER if you have fevers, vomiting, worsening pain or other worrisome changes.

## 2022-12-02 NOTE — ED Triage Notes (Signed)
Pt having RLQ pain. She has been up since 0300 with it. Woke her up and she is on wegovy so she thought may be issues with that but this pain seems much worse. Endorses nausea and vomiting. Pain is constant and denies urinary symptoms.

## 2022-12-02 NOTE — ED Provider Notes (Signed)
[facility] Provider Note   CSN: [id number] Arrival date & time: 12/02/22  0645     History  Chief Complaint  Patient presents with   Abdominal Pain    Vanessa Copeland is a 56 y.o. female.  This is a 56 year old female who is past history of kidney stones, last requiring a stent approximately 8 to 10 months ago, who presents the ER with right flank pain.  She states that started suddenly this morning was associated with nausea and dry heaves.  No fevers or chills, she denies any urinary complaints, no diarrhea or constipation.  She denies any exacerbating relieving factors.  She reports this feels similar to previous kidney stones but is more painful.  The history is provided by the patient.  Abdominal Pain Associated symptoms: no chest pain, no chills, no cough, no dysuria, no fever, no hematuria, no shortness of breath, no sore throat and no vomiting        Home Medications Prior to Admission medications   Medication Sig Start Date End Date Taking? Authorizing Provider  albuterol (VENTOLIN HFA) 108 (90 Base) MCG/ACT inhaler Inhale 2 puffs into the lungs every 6 (six) hours as needed for wheezing or shortness of breath. 02/03/22   Francis Gaines B, PA-C  EPINEPHrine 0.3 mg/0.3 mL IJ SOAJ injection Inject 0.3 mg into the muscle as needed for anaphylaxis. 10/09/22   Tysinger, Camelia Eng, PA-C  fluticasone (FLONASE) 50 MCG/ACT nasal spray Place 2 sprays into both nostrils daily. 02/14/21   Rita Ohara, MD  ibuprofen (ADVIL) 800 MG tablet Take 1 tablet (800 mg total) by mouth every 8 (eight) hours as needed for jaw pain 09/03/22   Denita Lung, MD  levocetirizine (XYZAL) 5 MG tablet Take 1 tablet (5 mg total) by mouth every evening. 07/20/19   Tysinger, Camelia Eng, PA-C  levonorgestrel (MIRENA) 20 MCG/24HR IUD 1 each by Intrauterine route once.    [provider]  meloxicam (MOBIC) 15 MG tablet Take 1 tablet (15 mg total) by mouth daily as  needed for pain. 04/28/22 04/28/23  Trula Slade, DPM  propranolol (INDERAL) 10 MG tablet Take 1 tablet (10 mg total) by mouth 4 (four) times daily as needed (palpitations/fast heart rate). 07/11/22   Nahser, Wonda Cheng, MD  rosuvastatin (CRESTOR) 10 MG tablet Take 1 tablet (10 mg total) by mouth 2 (two) times a week, Wednesday & Sunday. 09/15/22   Danford, Valetta Fuller D, NP  Semaglutide-Weight Management (WEGOVY) 1.7 MG/0.75ML SOAJ Inject 1.7 mg into the skin once a week. 11/17/22   Whitmire, Joneen Boers, FNP  Vitamin D, Ergocalciferol, (DRISDOL) 1.25 MG (50000 UNIT) CAPS capsule Take 1 capsule (50,000 Units total) by mouth every 7 (seven) days. 11/17/22   Whitmire, Joneen Boers, FNP      Allergies    Shellfish allergy    Review of Systems   Review of Systems  Constitutional:  Negative for chills and fever.  HENT:  Negative for ear pain and sore throat.   Eyes:  Negative for pain and visual disturbance.  Respiratory:  Negative for cough and shortness of breath.   Cardiovascular:  Negative for chest pain and palpitations.  Gastrointestinal:  Positive for abdominal pain. Negative for vomiting.  Genitourinary:  Negative for dysuria and hematuria.  Musculoskeletal:  Negative for arthralgias and back pain.  Skin:  Negative for color change and rash.  Neurological:  Negative for seizures and syncope.  All other systems reviewed and are negative.  Physical Exam Updated Vital Signs BP (!) 186/87 (BP Location: Right Arm)   Pulse 84   Temp 97.6 F (36.4 C)   Resp 18   SpO2 97%  Physical Exam Vitals and nursing note reviewed.  Constitutional:      General: She is not in acute distress.    Appearance: She is well-developed.  HENT:     Head: Normocephalic and atraumatic.  Eyes:     Extraocular Movements: Extraocular movements intact.     Conjunctiva/sclera: Conjunctivae normal.  Cardiovascular:     Rate and Rhythm: Normal rate and regular rhythm.     Heart sounds: No murmur heard. Pulmonary:      Effort: Pulmonary effort is normal. No respiratory distress.     Breath sounds: Normal breath sounds.  Abdominal:     General: Abdomen is flat. There is no distension.     Palpations: Abdomen is soft.     Tenderness: There is no abdominal tenderness. There is no right CVA tenderness, left CVA tenderness, guarding or rebound.  Musculoskeletal:        General: No swelling.     Cervical back: Neck supple.  Skin:    General: Skin is warm and dry.     Capillary Refill: Capillary refill takes less than 2 seconds.  Neurological:     Mental Status: She is alert.  Psychiatric:        Mood and Affect: Mood normal.     ED Results / Procedures / Treatments   Labs (all labs ordered are listed, but only abnormal results are displayed) Labs Reviewed  COMPREHENSIVE METABOLIC PANEL - Abnormal; Notable for the following components:      Result Value   Glucose, Bld 123 (*)    Creatinine, Ser 1.02 (*)    All other components within normal limits  CBC WITH DIFFERENTIAL/PLATELET - Abnormal; Notable for the following components:   RBC 3.85 (*)    Hemoglobin 11.3 (*)    HCT 34.7 (*)    RDW 15.7 (*)    All other components within normal limits  URINALYSIS, ROUTINE W REFLEX MICROSCOPIC - Abnormal; Notable for the following components:   Hgb urine dipstick LARGE (*)    RBC / HPF >50 (*)    Bacteria, UA RARE (*)    All other components within normal limits  LIPASE, BLOOD  I-STAT BETA HCG BLOOD, ED ([facility], [facility], [facility] ONLY)    EKG None  Radiology No results found.  Procedures Procedures    Medications Ordered in ED Medications - No data to display  ED Course/ Medical Decision Making/ A&P Clinical Course as of 12/02/22 0847  Tue Dec 02, 2022  0839 RBC / HPF(!): >50 [CB]  0839 Bacteria, UA(!): RARE [CB]  0839 Hgb urine dipstick(!): LARGE [CB]    Clinical Course User Index [CB] Gwenevere Abbot, PA-C                           Medical Decision Making 40-year-old female presented with right  flank pain.  Has had a history of kidney stones but no other past medical history.  Ddx. Includes but is not limited to renal colic, appendicitis, colitis, ovarian torsion, She has got hematuria, labs are reassuring otherwise.  Initially she was very hypertensive but this resolved with pain control with Toradol, patient is feeling much better he has a distal 3 mm ureteral stone.  She is informed of her lab and radiology findings need  for follow-up and advised to come back to the ED for new or worsening symptoms.  She follows up with [facility].  Amount and/or Complexity of Data Reviewed External Data Reviewed: labs and radiology. Labs: ordered. Decision-making details documented in ED Course. Radiology: ordered.           Final Clinical Impression(s) / ED Diagnoses Final diagnoses:  None    Rx / DC Orders ED Discharge Orders     None         Gwenevere Abbot, PA-C 12/02/22 1031    Teressa Lower, MD 12/02/22 [phone]

## 2022-12-02 NOTE — ED Notes (Signed)
Patient transported to CT 

## 2022-12-02 NOTE — ED Notes (Signed)
This RN reviewed discharge instructions with patient. She verbalized understanding and denied any further questions. PT well appearing upon discharge and reports tolerable pain. Pt ambulated with stable gait to exit. PIV removed.

## 2022-12-03 ENCOUNTER — Telehealth: Payer: Self-pay | Admitting: Medical

## 2022-12-03 NOTE — Telephone Encounter (Signed)
Transition Care Management Follow-up Telephone Call Date of discharge and from where: 12/02/2022 [facility] ER How have you been since you were released from the hospital? Much better Any questions or concerns? No  Items Reviewed: Did the pt receive and understand the discharge instructions provided? Yes  Medications obtained and verified? Yes  Other? No  Any new allergies since your discharge? No  Dietary orders reviewed? No Do you have support at home? Yes   Home Care and Equipment/Supplies: Were home health services ordered? not applicable  Follow up appointments reviewed:   [facility] f/u appt confirmed? Yes  Pt needs to see Urology for kidney stone. She has a history of them. She has not yet called. Are transportation arrangements needed? No  If their condition worsens, is the pt aware to call PCP or go to the Emergency Dept.? Yes Was the patient provided with contact information for the PCP's office or ED? Yes Was to pt encouraged to call back with questions or concerns? Yes

## 2022-12-11 MED FILL — Ibuprofen Tab 800 MG: ORAL | 10 days supply | Qty: 30 | Fill #1 | Status: AC

## 2022-12-16 NOTE — Progress Notes (Unsigned)
TeleHealth Visit:  This visit was completed with telemedicine (audio/video) technology. Vanessa Copeland has verbally consented to this TeleHealth visit. The patient is located at home, the provider is located at home. The participants in this visit include the listed provider and patient. The visit was conducted today via MyChart video.  OBESITY Vanessa Copeland is here to discuss her progress with her obesity treatment plan along with follow-up of her obesity related diagnoses.   Today's visit was # 71 Starting weight: 256 lbs Starting date: 09/06/2019 Weight in office on 11/17/22: 234 lbs Weight in office on 12/17/22: 235 lbs Today's reported weight: 21 lbs Nutrition Plan: the Category 3 Plan- 80% adherence   Current exercise:   walking 30 minutes 4 times per week in am  Interim History:  Meal plan compliance has been fairly good considering the holidays at 80%.  She has been focusing on increasing protein and avoiding carbohydrates.  Generally packs lunch for work and also packs protein snacks such as boiled eggs.  She does not skip meals.  Cooks dinner at [employer] and vegetables.  Reports she needs to increase her water intake.  Still drinking sweet tea.  On Wegovy 1.7 mg weekly.  Appetite and cravings well-controlled.  No side effects.  Assessment/Plan:  We discussed recent lab results in depth.  1. Prediabetes A1c is currently 5.6, down from 5.9 on 08/20/2022. Currently on Wegovy 1.7 mg weekly for polyphagia.  Lab Results  Component Value Date   HGBA1C 5.6 11/17/2022   Lab Results  Component Value Date   INSULIN 10.8 11/17/2022   INSULIN 18.7 08/20/2022   INSULIN 11.4 05/20/2022   INSULIN 11.6 01/16/2022   INSULIN 19.4 08/29/2021    Plan: Continue Wegovy 1.7 mg weekly. Continue to work on reducing simple carbohydrates.  2. Vitamin D Deficiency Vitamin D is at goal of 50.  Level is 50.9 per most recent labs on 11/17/2022.  Slightly down from 52.1 on  08/20/2022. She is on weekly prescription Vitamin D 50,000 IU.  Lab Results  Component Value Date   VD25OH 50.9 11/17/2022   VD25OH 52.1 08/20/2022   VD25OH 37.8 05/20/2022    Plan: Continue prescription vitamin D 50,000 IU weekly.   3. Obesity: Current BMI 41.7 Refill Wegovy 1.7 mg weekly Vanessa Copeland is currently in the action stage of change. As such, her goal is to continue with weight loss efforts.  She has agreed to the Category 3 Plan.   1.  Increase meal plan compliance to 90% or greater. 2.  Work on cutting out sweet tea.  Encouraged her to try artificial sweetener and tea.  Exercise goals: Encouraged resistance training 3 days/week.  Behavioral modification strategies: increasing lean protein intake, decreasing simple carbohydrates, increasing water intake, decreasing liquid calories, and planning for success.  Vanessa Copeland has agreed to follow-up with our clinic in 4 weeks.   No orders of the defined types were placed in this encounter.   Medications Discontinued During This Encounter  Medication Reason   Semaglutide-Weight Management (WEGOVY) 1.7 MG/0.75ML SOAJ Reorder     Meds ordered this encounter  Medications   Semaglutide-Weight Management (WEGOVY) 1.7 MG/0.75ML SOAJ    Sig: Inject 1.7 mg into the skin once a week.    Dispense:  3 mL    Refill:  0    Order Specific Question:   Supervising Provider    Answer:   Dell Ponto [[id number]]      Objective:   VITALS: Per patient if applicable, see vitals. GENERAL: Alert  and in no acute distress. CARDIOPULMONARY: No increased WOB. Speaking in clear sentences.  PSYCH: Pleasant and cooperative. Speech normal rate and rhythm. Affect is appropriate. Insight and judgement are appropriate. Attention is focused, linear, and appropriate.  NEURO: Oriented as arrived to appointment on time with no prompting.   Lab Results  Component Value Date   CREATININE 1.02 (H) 12/02/2022   BUN 15 12/02/2022   NA 142 12/02/2022   K 3.6  12/02/2022   CL 106 12/02/2022   CO2 24 12/02/2022   Lab Results  Component Value Date   ALT 19 12/02/2022   AST 22 12/02/2022   ALKPHOS 73 12/02/2022   BILITOT 0.5 12/02/2022   Lab Results  Component Value Date   HGBA1C 5.6 11/17/2022   HGBA1C 5.9 (H) 08/20/2022   HGBA1C 5.4 05/20/2022   HGBA1C 5.7 (H) 01/16/2022   HGBA1C 6.0 (H) 08/29/2021   Lab Results  Component Value Date   INSULIN 10.8 11/17/2022   INSULIN 18.7 08/20/2022   INSULIN 11.4 05/20/2022   INSULIN 11.6 01/16/2022   INSULIN 19.4 08/29/2021   Lab Results  Component Value Date   TSH 2.350 05/20/2022   Lab Results  Component Value Date   CHOL 156 08/20/2022   HDL 46 08/20/2022   LDLCALC 96 08/20/2022   TRIG 74 08/20/2022   CHOLHDL 3.4 08/20/2022   Lab Results  Component Value Date   WBC 9.3 12/02/2022   HGB 11.3 (L) 12/02/2022   HCT 34.7 (L) 12/02/2022   MCV 90.1 12/02/2022   PLT 297 12/02/2022   Lab Results  Component Value Date   IRON 57 12/27/2019   TIBC 321 12/27/2019   FERRITIN 51 12/27/2019   Lab Results  Component Value Date   VD25OH 50.9 11/17/2022   VD25OH 52.1 08/20/2022   VD25OH 37.8 05/20/2022    Attestation Statements:   Reviewed by clinician on day of visit: allergies, medications, problem list, medical history, surgical history, family history, social history, and previous encounter notes.

## 2022-12-17 ENCOUNTER — Other Ambulatory Visit: Payer: Self-pay

## 2022-12-17 ENCOUNTER — Telehealth (INDEPENDENT_AMBULATORY_CARE_PROVIDER_SITE_OTHER): Payer: No Typology Code available for payment source | Admitting: Family Medicine

## 2022-12-17 ENCOUNTER — Encounter (INDEPENDENT_AMBULATORY_CARE_PROVIDER_SITE_OTHER): Payer: Self-pay | Admitting: Family Medicine

## 2022-12-17 ENCOUNTER — Other Ambulatory Visit (HOSPITAL_COMMUNITY): Payer: Self-pay

## 2022-12-17 ENCOUNTER — Encounter (INDEPENDENT_AMBULATORY_CARE_PROVIDER_SITE_OTHER): Payer: Self-pay

## 2022-12-17 DIAGNOSIS — E559 Vitamin D deficiency, unspecified: Secondary | ICD-10-CM | POA: Diagnosis not present

## 2022-12-17 DIAGNOSIS — E669 Obesity, unspecified: Secondary | ICD-10-CM | POA: Diagnosis not present

## 2022-12-17 DIAGNOSIS — Z6841 Body Mass Index (BMI) 40.0 and over, adult: Secondary | ICD-10-CM

## 2022-12-17 DIAGNOSIS — R7303 Prediabetes: Secondary | ICD-10-CM | POA: Diagnosis not present

## 2022-12-17 MED ORDER — WEGOVY 1.7 MG/0.75ML ~~LOC~~ SOAJ
1.7000 mg | SUBCUTANEOUS | 0 refills | Status: DC
  Filled 2022-12-17 (×3): qty 3, 28d supply, fill #0

## 2022-12-26 ENCOUNTER — Ambulatory Visit: Payer: No Typology Code available for payment source | Admitting: Family

## 2023-01-02 ENCOUNTER — Ambulatory Visit (INDEPENDENT_AMBULATORY_CARE_PROVIDER_SITE_OTHER): Payer: 59

## 2023-01-02 ENCOUNTER — Ambulatory Visit (INDEPENDENT_AMBULATORY_CARE_PROVIDER_SITE_OTHER): Payer: 59 | Admitting: Orthopaedic Surgery

## 2023-01-02 DIAGNOSIS — M25512 Pain in left shoulder: Secondary | ICD-10-CM

## 2023-01-02 DIAGNOSIS — G8929 Other chronic pain: Secondary | ICD-10-CM

## 2023-01-02 DIAGNOSIS — M7502 Adhesive capsulitis of left shoulder: Secondary | ICD-10-CM

## 2023-01-02 MED ORDER — LIDOCAINE HCL 1 % IJ SOLN
4.0000 mL | INTRAMUSCULAR | Status: AC | PRN
  Administered 2023-01-02: 4 mL

## 2023-01-02 MED ORDER — TRIAMCINOLONE ACETONIDE 40 MG/ML IJ SUSP
80.0000 mg | INTRAMUSCULAR | Status: AC | PRN
  Administered 2023-01-02: 80 mg via INTRA_ARTICULAR

## 2023-01-02 NOTE — Progress Notes (Signed)
Chief Complaint: Left shoulder pain     History of Present Illness:    Vanessa Copeland is a 57 y.o. female right female presents with left shoulder pain which has been atraumatic in nature for the last couple months.  She states that the pain is deep and aching.  She has not had any previous surgeries or injuries.  She experiences the pain when going overhead or terminal motion.  She has pain with reaching to the back pocket or with putting on her bra.  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    Surgical History:   none  PMH/PSH/Family History/Social History/Meds/Allergies:    Past Medical History:  Diagnosis Date  . Allergy   . Anemia   . Anxiety   . Asthma    mild intermittent(when allergies are flaring)  . Complication of anesthesia   . Food allergy    Shellfish  . H/O echocardiogram 12/2005   Trace MR, mild TR, nl L ventricular systolic function.  Marland Kitchen Heart murmur    since birth. Never has had any problems  . Hepatic steatosis    per CT  . History of kidney stones   . Kidney stones   . Morbid obesity ([place]) 04/29/2019  . Obesity, unspecified   . PONV (postoperative nausea and vomiting)   . Vitamin D deficiency 01/2010   Past Surgical History:  Procedure Laterality Date  . CESAREAN SECTION     x3  . COLONOSCOPY    . CYSTOSCOPY WITH RETROGRADE PYELOGRAM, URETEROSCOPY AND STENT PLACEMENT Left 07/16/2018   Procedure: CYSTOSCOPY WITH RETROGRADE PYELOGRAM, URETEROSCOPY AND STENT PLACEMENT;  Surgeon: Cleon Gustin, MD;  Location: [facility];  Service: Urology;  Laterality: Left;  . EXTRACORPOREAL SHOCK WAVE LITHOTRIPSY Left 06/21/2018   Procedure: LEFT EXTRACORPOREAL SHOCK WAVE LITHOTRIPSY (ESWL);  Surgeon: Franchot Gallo, MD;  Location: [facility];  Service: Urology;  Laterality: Left;  . HOLMIUM LASER APPLICATION Left 2/95/6213   Procedure: HOLMIUM LASER APPLICATION;  Surgeon: Cleon Gustin, MD;   Location: [facility];  Service: Urology;  Laterality: Left;  . [place] SURGERY  2019  . OOPHORECTOMY  2002   left due to cyst  . POLYPECTOMY    . TUBAL LIGATION  1996   Social History   Socioeconomic History  . Marital status: Married    Spouse name: Jeiry Birnbaum  . Number of children: 3  . Years of education: Not on file  . Highest education level: Not on file  Occupational History  . 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[employer]  Tobacco Use  . Smoking status: Never  . Smokeless tobacco: Never  Vaping Use  . Vaping Use: Never used  Substance and Sexual Activity  . Alcohol use: No  . Drug use: No  . Sexual activity: Yes    Partners: Male    Birth control/protection: Surgical  Other Topics Concern  . Not on file  Social History Narrative   Lives with husband, 2/3 children.  (2 daughters).  Son lives in [place].  1 dog   Social Determinants of Health   Financial Resource Strain: Not on file  Food Insecurity: Not on file  Transportation Needs: Not on file  Physical Activity: Not on file  Stress: Not on  file  Social Connections: Not on file   Family History  Problem Relation Age of Onset  . Hypertension Mother   . Anemia Mother   . Colon polyps Mother   . Anxiety disorder Mother   . Cancer Father        lung  . Kidney Stones Father   . Hypertension Father   . Lung cancer Father 40  . Asthma Brother   . Asthma Daughter   . Allergies Daughter   . Asthma Daughter   . Hypertension Brother   . Breast cancer Neg Hx   . Colon cancer Neg Hx   . Diabetes Neg Hx   . Heart disease Neg Hx   . Esophageal cancer Neg Hx   . Rectal cancer Neg Hx   . Stomach cancer Neg Hx    Allergies  Allergen Reactions  . Shellfish Allergy Shortness Of Breath   Current Outpatient Medications  Medication Sig Dispense Refill  . albuterol (VENTOLIN HFA) 108 (90 Base) MCG/ACT inhaler Inhale 2 puffs into the lungs every 6 (six) hours as needed for  wheezing or shortness of breath. 18 g 1  . EPINEPHrine 0.3 mg/0.3 mL IJ SOAJ injection Inject 0.3 mg into the muscle as needed for anaphylaxis. 1 each 0  . fluticasone (FLONASE) 50 MCG/ACT nasal spray Place 2 sprays into both nostrils daily. 16 g 6  . HYDROcodone-acetaminophen (NORCO/VICODIN) 5-325 MG tablet Take 1 tablet by mouth every 4 (four) hours as needed. 10 tablet 0  . ibuprofen (ADVIL) 800 MG tablet Take 1 tablet (800 mg total) by mouth every 8 (eight) hours as needed for jaw pain 30 tablet 1  . levocetirizine (XYZAL) 5 MG tablet Take 1 tablet (5 mg total) by mouth every evening. 30 tablet 2  . levonorgestrel (MIRENA) 20 MCG/24HR IUD 1 each by Intrauterine route once.    . meloxicam (MOBIC) 15 MG tablet Take 1 tablet (15 mg total) by mouth daily as needed for pain. 30 tablet 0  . naproxen (NAPROSYN) 375 MG tablet Take 1 tablet (375 mg total) by mouth 2 (two) times daily. 20 tablet 0  . propranolol (INDERAL) 10 MG tablet Take 1 tablet (10 mg total) by mouth 4 (four) times daily as needed (palpitations/fast heart rate). 60 tablet 11  . rosuvastatin (CRESTOR) 10 MG tablet Take 1 tablet (10 mg total) by mouth 2 (two) times a week, Wednesday & Sunday. 30 tablet 0  . Semaglutide-Weight Management (WEGOVY) 1.7 MG/0.75ML SOAJ Inject 1.7 mg into the skin once a week. 3 mL 0  . tamsulosin (FLOMAX) 0.4 MG CAPS capsule Take 1 capsule (0.4 mg total) by mouth daily. 30 capsule 0  . Vitamin D, Ergocalciferol, (DRISDOL) 1.25 MG (50000 UNIT) CAPS capsule Take 1 capsule (50,000 Units total) by mouth every 7 (seven) days. 4 capsule 0   No current facility-administered medications for this visit.   No results found.  Review of Systems:   A ROS was performed including pertinent positives and negatives as documented in the HPI.  Physical Exam :   Constitutional: NAD and appears stated age Neurological: Alert and oriented Psych: Appropriate affect and cooperative There were no vitals taken for this  visit.   Comprehensive Musculoskeletal Exam:    Musculoskeletal Exam    Inspection Right Left  Skin No atrophy or winging No atrophy or winging  Palpation    Tenderness None Glenohumeral  Range of Motion    Flexion (passive) 170 170  Flexion (active) 170 170  Abduction  170 170  ER at the side 70 70  Can reach behind back to T12 T12  Strength     Full Full  Special Tests    Pseudoparalytic No No  Neurologic    Fires PIN, radial, median, ulnar, musculocutaneous, axillary, suprascapular, long thoracic, and spinal accessory innervated muscles. No abnormal sensibility  Vascular/Lymphatic    Radial Pulse 2+ 2+  Cervical Exam    Patient has symmetric cervical range of motion with negative Spurling's test.  Special Test: Pain with internal rotation     Imaging:   Xray (3 views left shoulder): Normal    I personally reviewed and interpreted the radiographs.   Assessment:   57 y.o. female with left shoulder pain consistent with early adhesive capsulitis.  Given that I recommended ultrasound-guided glenohumeral injection at today's visit.  She would like to proceed with this.  I will plan to see her back in 2 weeks for reassessment  Plan :    -Return to clinic in 2 weeks for reassessment -Left ultrasound-guided glenohumeral injection performed verbal consent obtained    Procedure Note  Patient: [name]             Date of Birth: 02-Jan-1966           MRN: [id number]             Visit Date: 01/02/2023  Procedures: Visit Diagnoses:  1. Chronic left shoulder pain     Large Joint Inj: L glenohumeral on 01/02/2023 2:50 PM Indications: pain Details: 22 G 1.5 in needle, ultrasound-guided anterior approach  Arthrogram: No  Medications: 4 mL lidocaine 1 %; 80 mg triamcinolone acetonide 40 MG/ML Outcome: tolerated well, no immediate complications Procedure, treatment alternatives, risks and benefits explained, specific risks discussed. Consent was given by the  patient. Immediately prior to procedure a time out was called to verify the correct patient, procedure, equipment, support staff and site/side marked as required. Patient was prepped and draped in the usual sterile fashion.          I personally saw and evaluated the patient, and participated in the management and treatment plan.  Vanetta Mulders, MD Attending Physician, Orthopedic Surgery  This document was dictated using Dragon voice recognition software. A reasonable attempt at proof reading has been made to minimize errors.

## 2023-01-02 NOTE — Addendum Note (Signed)
Addended by: Yevonne Pax on: 01/02/2023 02:51 PM   Modules accepted: Level of Service

## 2023-01-07 ENCOUNTER — Telehealth: Payer: 59 | Admitting: Nurse Practitioner

## 2023-01-07 DIAGNOSIS — U071 COVID-19: Secondary | ICD-10-CM | POA: Diagnosis not present

## 2023-01-07 MED ORDER — NIRMATRELVIR/RITONAVIR (PAXLOVID)TABLET: 3.0000 | ORAL_TABLET | Freq: Two times a day (BID) | ORAL | 0 refills | Status: AC

## 2023-01-07 NOTE — Progress Notes (Signed)
Virtual Visit Consent   Vanessa Copeland, you are scheduled for a virtual visit with Mary-Margaret Hassell Done, [place], a [facility] provider, today.     Just as with appointments in the office, your consent must be obtained to participate.  Your consent will be active for this visit and any virtual visit you may have with one of our providers in the next 365 days.     If you have a MyChart account, a copy of this consent can be sent to you electronically.  All virtual visits are billed to your insurance company just like a traditional visit in the office.    As this is a virtual visit, video technology does not allow for your provider to perform a traditional examination.  This may limit your provider's ability to fully assess your condition.  If your provider identifies any concerns that need to be evaluated in person or the need to arrange testing (such as labs, EKG, etc.), we will make arrangements to do so.     Although advances in technology are sophisticated, we cannot ensure that it will always work on either your end or our end.  If the connection with a video visit is poor, the visit may have to be switched to a telephone visit.  With either a video or telephone visit, we are not always able to ensure that we have a secure connection.     I need to obtain your verbal consent now.   Are you willing to proceed with your visit today? YES   Vanessa Copeland has provided verbal consent on 01/07/2023 for a virtual visit (video or telephone).   Mary-Margaret Hassell Done, FNP   Date: 01/07/2023 11:23 AM   Virtual Visit via Video Note   I, Mary-Margaret Hassell Done, connected with Vanessa Copeland ([id number], 10/31/1966) on 01/07/23 at 12:00 PM EST by a video-enabled telemedicine application and verified that I am speaking with the correct person using two identifiers.  Location: Patient: Virtual Visit Location Patient: Home Provider: Virtual Visit Location Provider: Mobile   I discussed the limitations of  evaluation and management by telemedicine and the availability of in person appointments. The patient expressed understanding and agreed to proceed.    History of Present Illness: Vanessa Copeland is a 57 y.o. who identifies as a female who was assigned female at birth, and is being seen today for covid.  HPI: Tested positive for covid yesterday  URI  Episode onset: MOnday. The problem has been gradually worsening. There has been no fever. Associated symptoms include congestion, coughing, headaches, rhinorrhea, sinus pain and a sore throat (better now). Pertinent negatives include no neck pain. Treatments tried: theraflu amd motrin. The treatment provided mild relief.    Review of Systems  HENT:  Positive for congestion, rhinorrhea, sinus pain and sore throat (better now).   Respiratory:  Positive for cough.   Musculoskeletal:  Negative for neck pain.  Neurological:  Positive for headaches.    Problems:  Patient Active Problem List   Diagnosis Date Noted   Mitral regurgitation 07/11/2022   Drug-induced constipation 03/12/2022   Plantar fasciitis 02/18/2022   Shellfish allergy 02/03/2022   Hyperlipidemia, pure 08/29/2021   Depression 08/12/2021   Nausea 05/09/2021   Symptomatic mammary hypertrophy 01/04/2021   Lumbar radiculopathy 05/03/2020   Vaccination phobia 02/27/2020   Palpitations 02/21/2020   Hypertrophy of ligamentum flavum 02/21/2020   Anxiety 02/02/2020   Hepatic steatosis    Prediabetes 10/11/2019   Class 3 severe obesity  with serious comorbidity and body mass index (BMI) of 40.0 to 44.9 in adult [facility]) 10/11/2019   Asthma    History of kidney stones    Vitamin D deficiency 10/21/2016   Screening for STD (sexually transmitted disease) 10/21/2016   Iron deficiency anemia due to chronic blood loss 10/04/2014   Allergic rhinitis 10/22/2012    Allergies:  Allergies  Allergen Reactions   Shellfish Allergy Shortness Of Breath   Medications:  Current Outpatient  Medications:    albuterol (VENTOLIN HFA) 108 (90 Base) MCG/ACT inhaler, Inhale 2 puffs into the lungs every 6 (six) hours as needed for wheezing or shortness of breath., Disp: 18 g, Rfl: 1   EPINEPHrine 0.3 mg/0.3 mL IJ SOAJ injection, Inject 0.3 mg into the muscle as needed for anaphylaxis., Disp: 1 each, Rfl: 0   fluticasone (FLONASE) 50 MCG/ACT nasal spray, Place 2 sprays into both nostrils daily., Disp: 16 g, Rfl: 6   HYDROcodone-acetaminophen (NORCO/VICODIN) 5-325 MG tablet, Take 1 tablet by mouth every 4 (four) hours as needed., Disp: 10 tablet, Rfl: 0   ibuprofen (ADVIL) 800 MG tablet, Take 1 tablet (800 mg total) by mouth every 8 (eight) hours as needed for jaw pain, Disp: 30 tablet, Rfl: 1   levocetirizine (XYZAL) 5 MG tablet, Take 1 tablet (5 mg total) by mouth every evening., Disp: 30 tablet, Rfl: 2   levonorgestrel (MIRENA) 20 MCG/24HR IUD, 1 each by Intrauterine route once., Disp: , Rfl:    meloxicam (MOBIC) 15 MG tablet, Take 1 tablet (15 mg total) by mouth daily as needed for pain., Disp: 30 tablet, Rfl: 0   naproxen (NAPROSYN) 375 MG tablet, Take 1 tablet (375 mg total) by mouth 2 (two) times daily., Disp: 20 tablet, Rfl: 0   propranolol (INDERAL) 10 MG tablet, Take 1 tablet (10 mg total) by mouth 4 (four) times daily as needed (palpitations/fast heart rate)., Disp: 60 tablet, Rfl: 11   rosuvastatin (CRESTOR) 10 MG tablet, Take 1 tablet (10 mg total) by mouth 2 (two) times a week, Wednesday & Sunday., Disp: 30 tablet, Rfl: 0   Semaglutide-Weight Management (WEGOVY) 1.7 MG/0.75ML SOAJ, Inject 1.7 mg into the skin once a week., Disp: 3 mL, Rfl: 0   tamsulosin (FLOMAX) 0.4 MG CAPS capsule, Take 1 capsule (0.4 mg total) by mouth daily., Disp: 30 capsule, Rfl: 0   Vitamin D, Ergocalciferol, (DRISDOL) 1.25 MG (50000 UNIT) CAPS capsule, Take 1 capsule (50,000 Units total) by mouth every 7 (seven) days., Disp: 4 capsule, Rfl: 0  Observations/Objective: Patient is well-developed,  well-nourished in no acute distress.  Resting comfortably  at home.  Head is normocephalic, atraumatic.  No labored breathing.  Speech is clear and coherent with logical content.  Patient is alert and oriented at baseline.  Voice raspy Dry cough  Assessment and Plan:  Vanessa Copeland in today with chief complaint of Covid Positive   1. Positive self-administered antigen test for COVID-19 1. Take meds as prescribed 2. Use a cool mist humidifier especially during the winter months and when heat has been humid. 3. Use saline nose sprays frequently 4. Saline irrigations of the nose can be very helpful if done frequently.  * 4X daily for 1 week*  * Use of a nettie pot can be helpful with this. Follow directions with this* 5. Drink plenty of fluids 6. Keep thermostat turn down low 7.For any cough or congestion- deslym or mucinex 8. For fever or aces or pains- take tylenol or ibuprofen appropriate for age  and weight.  * for fevers greater than 101 orally you may alternate ibuprofen and tylenol every  3 hours.   Meds ordered this encounter  Medications   nirmatrelvir/ritonavir (PAXLOVID) 20 x 150 MG & 10 x '100MG'$  TABS    Sig: Take 3 tablets by mouth 2 (two) times daily for 5 days. (Take nirmatrelvir 150 mg two tablets twice daily for 5 days and ritonavir 100 mg one tablet twice daily for 5 days) Patient GFR is >69    Dispense:  30 tablet    Refill:  0    Order Specific Question:   Supervising Provider    Answer:   Chase Picket [[id number]]        Follow Up Instructions: I discussed the assessment and treatment plan with the patient. The patient was provided an opportunity to ask questions and all were answered. The patient agreed with the plan and demonstrated an understanding of the instructions.  A copy of instructions were sent to the patient via MyChart.  The patient was advised to call back or seek an in-person evaluation if the symptoms worsen or if the condition fails to  improve as anticipated.  Time:  I spent 10 minutes with the patient via telehealth technology discussing the above problems/concerns.    Mary-Margaret Hassell Done, FNP

## 2023-01-07 NOTE — Patient Instructions (Addendum)
Vanessa Copeland, thank you for joining Chevis Pretty, FNP for today's virtual visit.  While this provider is not your primary care provider (PCP), if your PCP is located in our provider database this encounter information will be shared with them immediately following your visit.   [place] account gives you access to today's visit and all your visits, tests, and labs performed at [facility] " click here if you don't have a [place] account or go to [email or web address]  Consent: (Patient) Vanessa Copeland provided verbal consent for this virtual visit at the beginning of the encounter.  Current Medications:  Current Outpatient Medications:    nirmatrelvir/ritonavir (PAXLOVID) 20 x 150 MG & 10 x '100MG'$  TABS, Take 3 tablets by mouth 2 (two) times daily for 5 days. (Take nirmatrelvir 150 mg two tablets twice daily for 5 days and ritonavir 100 mg one tablet twice daily for 5 days) Patient GFR is >69, Disp: 30 tablet, Rfl: 0   albuterol (VENTOLIN HFA) 108 (90 Base) MCG/ACT inhaler, Inhale 2 puffs into the lungs every 6 (six) hours as needed for wheezing or shortness of breath., Disp: 18 g, Rfl: 1   EPINEPHrine 0.3 mg/0.3 mL IJ SOAJ injection, Inject 0.3 mg into the muscle as needed for anaphylaxis., Disp: 1 each, Rfl: 0   fluticasone (FLONASE) 50 MCG/ACT nasal spray, Place 2 sprays into both nostrils daily., Disp: 16 g, Rfl: 6   HYDROcodone-acetaminophen (NORCO/VICODIN) 5-325 MG tablet, Take 1 tablet by mouth every 4 (four) hours as needed., Disp: 10 tablet, Rfl: 0   ibuprofen (ADVIL) 800 MG tablet, Take 1 tablet (800 mg total) by mouth every 8 (eight) hours as needed for jaw pain, Disp: 30 tablet, Rfl: 1   levocetirizine (XYZAL) 5 MG tablet, Take 1 tablet (5 mg total) by mouth every evening., Disp: 30 tablet, Rfl: 2   levonorgestrel (MIRENA) 20 MCG/24HR IUD, 1 each by Intrauterine route once., Disp: , Rfl:    meloxicam (MOBIC) 15 MG tablet, Take 1 tablet  (15 mg total) by mouth daily as needed for pain., Disp: 30 tablet, Rfl: 0   naproxen (NAPROSYN) 375 MG tablet, Take 1 tablet (375 mg total) by mouth 2 (two) times daily., Disp: 20 tablet, Rfl: 0   propranolol (INDERAL) 10 MG tablet, Take 1 tablet (10 mg total) by mouth 4 (four) times daily as needed (palpitations/fast heart rate)., Disp: 60 tablet, Rfl: 11   rosuvastatin (CRESTOR) 10 MG tablet, Take 1 tablet (10 mg total) by mouth 2 (two) times a week, Wednesday & Sunday., Disp: 30 tablet, Rfl: 0   Semaglutide-Weight Management (WEGOVY) 1.7 MG/0.75ML SOAJ, Inject 1.7 mg into the skin once a week., Disp: 3 mL, Rfl: 0   tamsulosin (FLOMAX) 0.4 MG CAPS capsule, Take 1 capsule (0.4 mg total) by mouth daily., Disp: 30 capsule, Rfl: 0   Vitamin D, Ergocalciferol, (DRISDOL) 1.25 MG (50000 UNIT) CAPS capsule, Take 1 capsule (50,000 Units total) by mouth every 7 (seven) days., Disp: 4 capsule, Rfl: 0   Medications ordered in this encounter:  Meds ordered this encounter  Medications   nirmatrelvir/ritonavir (PAXLOVID) 20 x 150 MG & 10 x '100MG'$  TABS    Sig: Take 3 tablets by mouth 2 (two) times daily for 5 days. (Take nirmatrelvir 150 mg two tablets twice daily for 5 days and ritonavir 100 mg one tablet twice daily for 5 days) Patient GFR is >69    Dispense:  30 tablet    Refill:  0  Order Specific Question:   Supervising Provider    Answer:   Chase Picket [[id number]]     *If you need refills on other medications prior to your next appointment, please contact your pharmacy*  Follow-Up: Call back or seek an in-person evaluation if the symptoms worsen or if the condition fails to improve as anticipated.  [place] [phone]  Other Instructions 1. Take meds as prescribed 2. Use a cool mist humidifier especially during the winter months and when heat has been humid. 3. Use saline nose sprays frequently 4. Saline irrigations of the nose can be very helpful if done  frequently.  * 4X daily for 1 week*  * Use of a nettie pot can be helpful with this. Follow directions with this* 5. Drink plenty of fluids 6. Keep thermostat turn down low 7.For any cough or congestion- mucinex or delsymQuarantine and Isolation Quarantine and isolation refer to local and travel restrictions to protect the public and travelers from contagious diseases that constitute a public health threat. Contagious diseases are diseases that can spread from one person to another. Quarantine and isolation help to protect the public by preventing exposure to people who have or may have a contagious disease. Isolation separates people who are sick with a contagious disease from people who are not sick. Quarantine separates and restricts the movement of people who were exposed to a contagious disease to see if they become sick. You may be put in quarantine or isolation if you have been exposed to or diagnosed with any of the following diseases: Severe acute respiratory syndromes, such as COVID-19. Cholera. Diphtheria. Tuberculosis. Plague. Smallpox. Yellow fever. Viral hemorrhagic fevers, such as [place], Ebola, and [place]. When to quarantine or isolate Follow these rules, whether you have been vaccinated or not: Stay home and isolate from others when you are sick with a contagious disease. Isolate when you test positive for a contagious disease, even if you do not have symptoms. Isolate if you are sick and suspect that you may have a contagious disease. If you suspect that you have a contagious disease, get tested. If your test results are negative, you can end your isolation. If your test results are positive, follow the full isolation recommendations as told by your health care provider or local health authorities. Quarantine and stay away from others when you have been in close contact with someone who has tested positive for a contagious disease. Close contact is defined as being  less than 6 ft (1.8 m) away from an infected person for a total of 15 minutes or more over a 24-hour period. Do not go to places where you are unable to wear a mask, such as restaurants and some gyms. Stay home and separate from others as much as possible. Avoid being around people who may get very sick from the contagious disease that you have. Use a separate bathroom, if possible. Do not travel. For travel guidance, visit the CDC's travel webpage at SwankBlog.no Follow these instructions at home: Medicines  Take over-the-counter and prescription medicines as told by your health care provider. Finish all antibiotic medicine even when you start to feel better. Stay up to date with all your vaccines. Get scheduled vaccines and boosters as recommended by your health care provider. Lifestyle Wear a high-quality mask if you must be around others at home and in public, if recommended. Improve air flow (ventilation) at home to help prevent the disease from spreading to other people, if possible.  Do not share personal household items, like cups, towels, and utensils. Practice everyday hygiene and cleaning. General instructions Talk to your health care provider if you have a weakened body defense system (immune system). People with a weakened immune system may have a reduced immune response to vaccines. You may need to follow current prevention measures, including wearing a well-fitting mask, avoiding crowds, and avoiding poorly ventilated indoor places. Monitor symptoms and follow health care provider instructions, which may include resting, drinking fluids, and taking medicines. Follow specific isolation and quarantine recommendations if you are in places that can lead to disease outbreaks, such as correctional and detention facilities, homeless shelters, and cruise ships. Return to your normal activities as told by your health care provider. Ask your health care provider what activities are  safe for you. Keep all follow-up visits. This is important. Where to find more information CDC: SodaWaters.hu Contact a health care provider if: You have a fever. You have signs and symptoms that return or get worse after isolation. Get help right away if: You have difficulty breathing. You have chest pain. These symptoms may be an emergency. Get help right away. Call 911. Do not wait to see if the symptoms will go away. Do not drive yourself to the hospital. Summary Isolation and quarantine help protect the public by preventing exposure to people who have or may have a contagious disease. Isolate when you are sick or when you test positive, even if you do not have symptoms. Quarantine and stay away from others when you have been in close contact with someone who has tested positive for a contagious disease. This information is not intended to replace advice given to you by your health care provider. Make sure you discuss any questions you have with your health care provider. Document Revised: 12/19/2021 Document Reviewed: 11/28/2021 Elsevier Patient Education  [place].  8. For fever or aces or pains- take tylenol or ibuprofen appropriate for age and weight.  * for fevers greater than 101 orally you may alternate ibuprofen and tylenol every  3 hours.      If you have been instructed to have an in-person evaluation today at a local Urgent Care facility, please use the link below. It will take you to a list of all of our available [facility], including address, phone number and hours of operation. Please do not delay care.  [facility]  If you or a family member do not have a primary care provider, use the link below to schedule a visit and establish care. When you choose a  primary care physician or advanced practice provider, you gain a long-term partner in health. Find a Primary Care Provider  Learn more about [facility]'s in-office and virtual care options: [place] [facility]

## 2023-01-12 ENCOUNTER — Other Ambulatory Visit (HOSPITAL_COMMUNITY): Payer: Self-pay

## 2023-01-12 ENCOUNTER — Other Ambulatory Visit: Payer: Self-pay

## 2023-01-12 ENCOUNTER — Ambulatory Visit (INDEPENDENT_AMBULATORY_CARE_PROVIDER_SITE_OTHER): Payer: 59 | Admitting: Adult Health

## 2023-01-12 ENCOUNTER — Encounter (INDEPENDENT_AMBULATORY_CARE_PROVIDER_SITE_OTHER): Payer: Self-pay | Admitting: Adult Health

## 2023-01-12 VITALS — BP 130/86 | HR 67 | Temp 97.8°F | Ht 63.0 in | Wt 228.0 lb

## 2023-01-12 DIAGNOSIS — U071 COVID-19: Secondary | ICD-10-CM

## 2023-01-12 DIAGNOSIS — Z6841 Body Mass Index (BMI) 40.0 and over, adult: Secondary | ICD-10-CM | POA: Diagnosis not present

## 2023-01-12 DIAGNOSIS — E559 Vitamin D deficiency, unspecified: Secondary | ICD-10-CM

## 2023-01-12 DIAGNOSIS — E669 Obesity, unspecified: Secondary | ICD-10-CM | POA: Diagnosis not present

## 2023-01-12 MED ORDER — VITAMIN D (ERGOCALCIFEROL) 1.25 MG (50000 UNIT) PO CAPS
50000.0000 [IU] | ORAL_CAPSULE | ORAL | 0 refills | Status: DC
  Filled 2023-01-12: qty 4, 28d supply, fill #0

## 2023-01-12 MED ORDER — WEGOVY 1.7 MG/0.75ML ~~LOC~~ SOAJ
1.7000 mg | SUBCUTANEOUS | 0 refills | Status: DC
  Filled 2023-01-12: qty 3, 28d supply, fill #0

## 2023-01-16 ENCOUNTER — Ambulatory Visit (HOSPITAL_BASED_OUTPATIENT_CLINIC_OR_DEPARTMENT_OTHER): Payer: 59 | Admitting: Orthopaedic Surgery

## 2023-01-20 ENCOUNTER — Ambulatory Visit (INDEPENDENT_AMBULATORY_CARE_PROVIDER_SITE_OTHER): Payer: 59 | Admitting: Adult Health

## 2023-01-20 ENCOUNTER — Ambulatory Visit (INDEPENDENT_AMBULATORY_CARE_PROVIDER_SITE_OTHER): Payer: 59 | Admitting: Family Medicine

## 2023-01-23 ENCOUNTER — Encounter (HOSPITAL_BASED_OUTPATIENT_CLINIC_OR_DEPARTMENT_OTHER): Payer: Self-pay

## 2023-01-23 ENCOUNTER — Ambulatory Visit (HOSPITAL_BASED_OUTPATIENT_CLINIC_OR_DEPARTMENT_OTHER): Payer: 59 | Admitting: Orthopaedic Surgery

## 2023-01-26 NOTE — Progress Notes (Unsigned)
Chief Complaint:   OBESITY Vanessa Copeland is here to discuss her progress with her obesity treatment plan along with follow-up of her obesity related diagnoses. Vanessa Copeland is on the Category 3 Plan and states she is following her eating plan approximately 80% of the time. Vanessa Copeland states she is walking 20 minutes 3 times per week.  Today's visit was #: 57 Starting weight: 256 lbs Starting date: 09/06/2019 Today's weight: 228 lbs Today's date: 01/12/2023 Total lbs lost to date: 28 lbs Total lbs lost since last in-office visit: 9 lbs  Interim History: ***  Subjective:   1. Vitamin D deficiency On 11/17/2022, vitamin D level 50.9.  Patient endorses stable energy levels.  Patient is taking weekly ergocalciferol.  Patient denies nausea, vomiting, muscle weakness.  2. COVID-19 virus infection Nasal congestion, watery eyes, productive cough/yellow mucus.  5-day course of Paxlovid at least.  Assessment/Plan:   1. Vitamin D deficiency Refill- Vitamin D, Ergocalciferol, (DRISDOL) 1.25 MG (50000 UNIT) CAPS capsule; Take 1 capsule (50,000 Units total) by mouth every 7 (seven) days.  Dispense: 4 capsule; Refill: 0  2. COVID-19 virus infection Wear a mask until all symptoms resolve for at least 10 days.  3. Obesity, Current BMI 40.5 Refill - Semaglutide-Weight Management (WEGOVY) 1.7 MG/0.75ML SOAJ; Inject 1.7 mg into the skin once a week.  Dispense: 3 mL; Refill: 0  Vanessa Copeland is currently in the action stage of change. As such, her goal is to continue with weight loss efforts. She has agreed to the Category 3 Plan.   Exercise goals:  As is.   Behavioral modification strategies: increasing lean protein intake, decreasing simple carbohydrates, meal planning and cooking strategies, keeping healthy foods in the home, and planning for success.  Vanessa Copeland has agreed to follow-up with our clinic in 4 weeks. She was informed of the importance of frequent follow-up visits to maximize her success with intensive  lifestyle modifications for her multiple health conditions.   Objective:   Blood pressure 130/86, pulse 67, temperature 97.8 F (36.6 C), height '5\' 3"'$  (1.6 m), SpO2 99 %. Body mass index is 41.63 kg/m.  General: Cooperative, alert, well developed, in no acute distress. HEENT: Conjunctivae and lids unremarkable. Cardiovascular: Regular rhythm.  Lungs: Normal work of breathing. Neurologic: No focal deficits.   Lab Results  Component Value Date   CREATININE 1.02 (H) 12/02/2022   BUN 15 12/02/2022   NA 142 12/02/2022   K 3.6 12/02/2022   CL 106 12/02/2022   CO2 24 12/02/2022   Lab Results  Component Value Date   ALT 19 12/02/2022   AST 22 12/02/2022   ALKPHOS 73 12/02/2022   BILITOT 0.5 12/02/2022   Lab Results  Component Value Date   HGBA1C 5.6 11/17/2022   HGBA1C 5.9 (H) 08/20/2022   HGBA1C 5.4 05/20/2022   HGBA1C 5.7 (H) 01/16/2022   HGBA1C 6.0 (H) 08/29/2021   Lab Results  Component Value Date   INSULIN 10.8 11/17/2022   INSULIN 18.7 08/20/2022   INSULIN 11.4 05/20/2022   INSULIN 11.6 01/16/2022   INSULIN 19.4 08/29/2021   Lab Results  Component Value Date   TSH 2.350 05/20/2022   Lab Results  Component Value Date   CHOL 156 08/20/2022   HDL 46 08/20/2022   LDLCALC 96 08/20/2022   TRIG 74 08/20/2022   CHOLHDL 3.4 08/20/2022   Lab Results  Component Value Date   VD25OH 50.9 11/17/2022   VD25OH 52.1 08/20/2022   VD25OH 37.8 05/20/2022   Lab Results  Component Value Date   WBC 9.3 12/02/2022   HGB 11.3 (L) 12/02/2022   HCT 34.7 (L) 12/02/2022   MCV 90.1 12/02/2022   PLT 297 12/02/2022   Lab Results  Component Value Date   IRON 57 12/27/2019   TIBC 321 12/27/2019   FERRITIN 51 12/27/2019   Attestation Statements:   Reviewed by clinician on day of visit: allergies, medications, problem list, medical history, surgical history, family history, social history, and previous encounter notes.  I, Davy Pique, RMA, am acting as Location manager  for Mina Marble, NP.  I have reviewed the above documentation for accuracy and completeness, and I agree with the above. -  ***

## 2023-02-09 NOTE — Progress Notes (Signed)
TeleHealth Visit:  This visit was completed with telemedicine (audio/video) technology. Monnica has verbally consented to this TeleHealth visit. The patient is located at home, the provider is located at home. The participants in this visit include the listed provider and patient. The visit was conducted today via MyChart video.  OBESITY Vanessa Copeland is here to discuss her progress with her obesity treatment plan along with follow-up of her obesity related diagnoses.   Today's visit was # 80 Starting weight: 256 lbs Starting date: 09/06/2019 Weight at last in office visit: 228 lbs on 01/12/23 Total weight loss: 28 lbs at last in office visit on 01/12/23. Today's reported weight: 225 lbs   Nutrition Plan: the Category 3 plan.  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 [name] is recovering from [place] and is just starting to get her appetite back.  She is eating 3 meals a day but does not always get the prescribed protein. Typical day of food: Breakfast 2 eggs, [place] sausage Lunch-2-3 chicken wings and a salad Dinner.  Protein and 1 vegetable.  Generally cooks at home.  Struggles to eat 8 ounces of protein because of the [facility].  Antiobesity Medication Presley is on Wegovy 1.7 SQ weekly. Hunger is well controlled.  Reports occasional, mild GI discomfort/cramping.  Starting March 1 for she will have the state health insurance plan which will stop covering GLP-1 therapy as of April 1.   Anti-obesity medication or GLP-1/GIP  Date Dose Notes  Saxenda Start 08/14/20 0.6    Stop 09/19/21 3 mg Poor weight loss   Mounjaro Start 10/21/22 5 mg    Stop 1/26 23 5 $ mg No coverage       Wegovy 02/18/22 1 mg    05/29/22 1.7 mg                                                                 Assessment/Plan:  1. Hyperlipidemia LDL is at goal.  Fasting lipid profile on  08/20/2022: LDL 96, HDL slightly low at 46, triglycerides normal at 74. Medication(s): Crestor 10 mg times per week. Denies myalgias.  Lab Results  Component Value Date   CHOL 156 08/20/2022   HDL 46 08/20/2022   LDLCALC 96 08/20/2022   TRIG 74 08/20/2022   CHOLHDL 3.4 08/20/2022   Lab Results  Component Value Date   ALT 19 12/02/2022   AST 22 12/02/2022   ALKPHOS 73 12/02/2022   BILITOT 0.5 12/02/2022   The 10-year ASCVD risk score (Arnett DK, et al., 2019) is: 5.6%   Values used to calculate the score:     Age: 84 years     Sex: Female     Is Non-Hispanic African American: Yes     Diabetic: No     Tobacco smoker: No     Systolic Blood Pressure: [id number] mmHg     Is BP treated: Yes     HDL Cholesterol: 46 mg/dL     Total Cholesterol: 156 mg/dL  Plan: Refill Crestor 10 mg 2 times per week. Check fasting lipid profile within the next 2 months.  2. Vitamin D Deficiency Vitamin D is at goal of 50.  Most recent vitamin  D level was 50.9.Marland Kitchen She is on  prescription ergocalciferol 50,000 IU weekly. Lab Results  Component Value Date   VD25OH 50.9 11/17/2022   VD25OH 52.1 08/20/2022   VD25OH 37.8 05/20/2022    Plan: Refill prescription vitamin D 50,000 IU weekly.   3. Morbid obesity: Current BMI 40.4 Refill Wegovy 1.7 mg weekly.  Will send 87-monthsupply in March and hope that it can be filled considering supply issues. SAvenais currently in the action stage of change. As such, her goal is to continue with weight loss efforts.  She has agreed to the Category 3 plan.  1.  Discussed ways to increase protein using substitutions for meat. 2.  Have frozen meal at lunch rather than chicken wings.  Exercise goals: Walk 30 minutes 3 times per week.  Behavioral modification strategies: increasing lean protein intake, meal planning , and planning for success.  STilerhas agreed to follow-up with our clinic in 4 weeks.   No orders of the defined types were placed in this  encounter.   Medications Discontinued During This Encounter  Medication Reason   rosuvastatin (CRESTOR) 10 MG tablet Reorder   Vitamin D, Ergocalciferol, (DRISDOL) 1.25 MG (50000 UNIT) CAPS capsule Reorder   Semaglutide-Weight Management (WEGOVY) 1.7 MG/0.75ML SOAJ Reorder   Semaglutide-Weight Management (WEGOVY) 1.7 MG/0.75ML SOAJ Reorder   rosuvastatin (CRESTOR) 10 MG tablet Reorder   Vitamin D, Ergocalciferol, (DRISDOL) 1.25 MG (50000 UNIT) CAPS capsule Reorder     Meds ordered this encounter  Medications   DISCONTD: Semaglutide-Weight Management (WEGOVY) 1.7 MG/0.75ML SOAJ    Sig: Inject 1.7 mg into the skin once a week.    Dispense:  3 mL    Refill:  0    Order Specific Question:   Supervising Provider    Answer:   BDell Ponto[[id number]  DISCONTD: rosuvastatin (CRESTOR) 10 MG tablet    Sig: Take 1 tablet (10 mg total) by mouth 2 (two) times a week, Wednesday & Sunday.    Dispense:  30 tablet    Refill:  0    Order Specific Question:   Supervising Provider    Answer:   BNetty Starring  DISCONTD: Vitamin D, Ergocalciferol, (DRISDOL) 1.25 MG (50000 UNIT) CAPS capsule    Sig: Take 1 capsule (50,000 Units total) by mouth every 7 (seven) days.    Dispense:  4 capsule    Refill:  0    Order Specific Question:   Supervising Provider    Answer:   BNetty Starring  Vitamin D, Ergocalciferol, (DRISDOL) 1.25 MG (50000 UNIT) CAPS capsule    Sig: Take 1 capsule (50,000 Units total) by mouth every 7 (seven) days.    Dispense:  4 capsule    Refill:  0    Order Specific Question:   Supervising Provider    Answer:   BDell Ponto[[id number]]   Semaglutide-Weight Management (WEGOVY) 1.7 MG/0.75ML SOAJ    Sig: Inject 1.7 mg into the skin once a week.    Dispense:  3 mL    Refill:  0    Order Specific Question:   Supervising Provider    Answer:   BDell Ponto[[id number]]   rosuvastatin (CRESTOR) 10 MG tablet    Sig: Take 1 tablet (10 mg total) by mouth 2 (two) times a week,  Wednesday & Sunday.    Dispense:  30 tablet    Refill:  0    Order Specific Question:  Supervising Provider    Answer:   Dell Ponto NP:[id number]      Objective:   VITALS: Per patient if applicable, see vitals. GENERAL: Alert and in no acute distress. CARDIOPULMONARY: No increased WOB. Speaking in clear sentences.  PSYCH: Pleasant and cooperative. Speech normal rate and rhythm. Affect is appropriate. Insight and judgement are appropriate. Attention is focused, linear, and appropriate.  NEURO: Oriented as arrived to appointment on time with no prompting.   Lab Results  Component Value Date   CREATININE 1.02 (H) 12/02/2022   BUN 15 12/02/2022   NA 142 12/02/2022   K 3.6 12/02/2022   CL 106 12/02/2022   CO2 24 12/02/2022   Lab Results  Component Value Date   ALT 19 12/02/2022   AST 22 12/02/2022   ALKPHOS 73 12/02/2022   BILITOT 0.5 12/02/2022   Lab Results  Component Value Date   HGBA1C 5.6 11/17/2022   HGBA1C 5.9 (H) 08/20/2022   HGBA1C 5.4 05/20/2022   HGBA1C 5.7 (H) 01/16/2022   HGBA1C 6.0 (H) 08/29/2021   Lab Results  Component Value Date   INSULIN 10.8 11/17/2022   INSULIN 18.7 08/20/2022   INSULIN 11.4 05/20/2022   INSULIN 11.6 01/16/2022   INSULIN 19.4 08/29/2021   Lab Results  Component Value Date   TSH 2.350 05/20/2022   Lab Results  Component Value Date   CHOL 156 08/20/2022   HDL 46 08/20/2022   LDLCALC 96 08/20/2022   TRIG 74 08/20/2022   CHOLHDL 3.4 08/20/2022   Lab Results  Component Value Date   WBC 9.3 12/02/2022   HGB 11.3 (L) 12/02/2022   HCT 34.7 (L) 12/02/2022   MCV 90.1 12/02/2022   PLT 297 12/02/2022   Lab Results  Component Value Date   IRON 57 12/27/2019   TIBC 321 12/27/2019   FERRITIN 51 12/27/2019   Lab Results  Component Value Date   VD25OH 50.9 11/17/2022   VD25OH 52.1 08/20/2022   VD25OH 37.8 05/20/2022    Attestation Statements:   Reviewed by clinician on day of visit: allergies, medications, problem list,  medical history, surgical history, family history, social history, and previous encounter notes.  This was prepared with the assistance of Dragon Medical.  Occasional wrong-word or sound-a-like substitutions may have occurred due to the inherent limitations of voice recognition software.

## 2023-02-10 ENCOUNTER — Other Ambulatory Visit (HOSPITAL_COMMUNITY): Payer: Self-pay

## 2023-02-10 ENCOUNTER — Other Ambulatory Visit: Payer: Self-pay

## 2023-02-10 ENCOUNTER — Telehealth (INDEPENDENT_AMBULATORY_CARE_PROVIDER_SITE_OTHER): Payer: 59 | Admitting: Family Medicine

## 2023-02-10 ENCOUNTER — Encounter (INDEPENDENT_AMBULATORY_CARE_PROVIDER_SITE_OTHER): Payer: Self-pay | Admitting: Family Medicine

## 2023-02-10 DIAGNOSIS — Z6841 Body Mass Index (BMI) 40.0 and over, adult: Secondary | ICD-10-CM

## 2023-02-10 DIAGNOSIS — E785 Hyperlipidemia, unspecified: Secondary | ICD-10-CM

## 2023-02-10 DIAGNOSIS — E559 Vitamin D deficiency, unspecified: Secondary | ICD-10-CM

## 2023-02-10 DIAGNOSIS — E7849 Other hyperlipidemia: Secondary | ICD-10-CM

## 2023-02-10 MED ORDER — VITAMIN D (ERGOCALCIFEROL) 1.25 MG (50000 UNIT) PO CAPS: 50000.0000 [IU] | ORAL_CAPSULE | ORAL | 0 refills | Status: DC

## 2023-02-10 MED ORDER — WEGOVY 1.7 MG/0.75ML ~~LOC~~ SOAJ: 1.7000 mg | SUBCUTANEOUS | 0 refills | Status: DC

## 2023-02-10 MED ORDER — VITAMIN D (ERGOCALCIFEROL) 1.25 MG (50000 UNIT) PO CAPS
50000.0000 [IU] | ORAL_CAPSULE | ORAL | 0 refills | Status: DC
  Filled 2023-02-10 – 2023-02-11 (×2): qty 4, 28d supply, fill #0

## 2023-02-10 MED ORDER — ROSUVASTATIN CALCIUM 10 MG PO TABS: 10.0000 mg | ORAL_TABLET | ORAL | 0 refills | Status: DC

## 2023-02-10 MED ORDER — ROSUVASTATIN CALCIUM 10 MG PO TABS
10.0000 mg | ORAL_TABLET | ORAL | 0 refills | Status: DC
  Filled 2023-02-10: qty 26, 90d supply, fill #0

## 2023-02-10 MED ORDER — WEGOVY 1.7 MG/0.75ML ~~LOC~~ SOAJ
1.7000 mg | SUBCUTANEOUS | 0 refills | Status: DC
  Filled 2023-02-10 – 2023-02-13 (×3): qty 3, 28d supply, fill #0

## 2023-02-11 ENCOUNTER — Other Ambulatory Visit (HOSPITAL_COMMUNITY): Payer: Self-pay

## 2023-02-11 ENCOUNTER — Telehealth: Payer: Self-pay

## 2023-02-11 NOTE — Telephone Encounter (Signed)
PA submitted through Cover My Meds for [facility]. Awaiting insurance determination.  Key: [id number]

## 2023-02-13 ENCOUNTER — Other Ambulatory Visit (HOSPITAL_COMMUNITY): Payer: Self-pay

## 2023-02-17 NOTE — Telephone Encounter (Signed)
Received fax from [employer]. Wegovy approved from 02/13/23-09/04/23.

## 2023-03-02 ENCOUNTER — Other Ambulatory Visit (HOSPITAL_COMMUNITY): Payer: Self-pay

## 2023-03-09 ENCOUNTER — Encounter (INDEPENDENT_AMBULATORY_CARE_PROVIDER_SITE_OTHER): Payer: Self-pay | Admitting: *Deleted

## 2023-03-09 NOTE — Progress Notes (Signed)
TeleHealth Visit:  This visit was completed with telemedicine (audio/video) technology. [name] has verbally consented to this TeleHealth visit. The patient is located at home, the provider is located at home. The participants in this visit include the listed provider and patient. The visit was conducted today via MyChart video.  OBESITY Vanessa Copeland is here to discuss her progress with her obesity treatment plan along with follow-up of her obesity related diagnoses.   Today's visit was # 77 Starting weight: 256 lbs Starting date: 09/06/19 Weight at last in office visit: 228 lbs on 01/12/23 Total weight loss: 28 lbs at last in office visit on 01/12/23. Weight at [place] on 03/09/23- 226 lbs.  Nutrition Plan: the Category 3 plan - 80% adherence.  Current exercise: walking some.  Interim History:  She is now taking her lunch to work. She tried the [employer] and likes it.  Sometimes skips lunch when she does not have healthy food at work. Has cut back on sweet tea and she's switched to half and half.  Cooks at home for dinner and generally has protein and a vegetable. Has 2 eggs and [place] sausage for breakfast. She has walked for exercise some but the time change got her out of her routine.  Weight goal is 170 lbs.   Eating all of the prescribed protein: no Skipping meals: No Drinking adequate water: No- but better Drinking sugar sweetened beverages: Yes- has reduced Hunger controlled: moderately controlled. Cravings controlled:  moderately controlled.  Pharmacotherapy: [name] is on Wegovy 1.7 SQ weekly Adverse side effects: None Hunger is moderately controlled.  Cravings are moderately controlled.   She now has a Copywriter, advertising which will not cover injectable weight loss medications after April 1.  We will put in a PA but likely will not get it covered.  She is interested in discussing options.  She is been on phentermine and did not like the way it made her feel.  Has  history of recent nephrolithiasis so topiramate is not an option. Assessment/Plan:  1. Hyperlipidemia LDL is at goal. Medication(s): Rosuvastatin 10 mg twice weekly. No side effects. ASCVD 10-year risk score is 5.5%.  Lab Results  Component Value Date   CHOL 156 08/20/2022   HDL 46 08/20/2022   LDLCALC 96 08/20/2022   TRIG 74 08/20/2022   CHOLHDL 3.4 08/20/2022   CHOLHDL 4.0 08/29/2021   CHOLHDL 4.0 11/27/2020   Lab Results  Component Value Date   ALT 19 12/02/2022   AST 22 12/02/2022   ALKPHOS 73 12/02/2022   BILITOT 0.5 12/02/2022   The 10-year ASCVD risk score (Arnett DK, et al., 2019) is: 5.5%   Values used to calculate the score:     Age: 57 years     Sex: Female     Is Non-Hispanic African American: Yes     Diabetic: No     Tobacco smoker: No     Systolic Blood Pressure: [id number] mmHg     Is BP treated: Yes     HDL Cholesterol: 46 mg/dL     Total Cholesterol: 156 mg/dL  Plan: Continue statin.  2. Morbid Obesity: Current BMI 40.4 Pharmacotherapy Plan Continue and increase dose  Wegovy 2.4 mg SQ weekly [name] is currently in the action stage of change. As such, her goal is to continue with weight loss efforts.  She has agreed to the Category 3 plan.  Exercise goals: 30 minutes 3 times per week.  Behavioral modification strategies: increasing lean protein intake, decreasing simple carbohydrates ,  no meal skipping, meal planning , decrease liquid calories, increase water intake, planning for success, and pack lunch for work.  [name] has agreed to follow-up with our clinic in 4 weeks.   No orders of the defined types were placed in this encounter.   Medications Discontinued During This Encounter  Medication Reason   Semaglutide-Weight Management (WEGOVY) 1.7 MG/0.75ML SOAJ Dose change     Meds ordered this encounter  Medications   Semaglutide-Weight Management (WEGOVY) 2.4 MG/0.75ML SOAJ    Sig: Inject 2.4 mg into the skin once a week.    Dispense:  3 mL     Refill:  0    Order Specific Question:   Supervising Provider    Answer:   Dell Ponto [[id number]]      Objective:   VITALS: Per patient if applicable, see vitals. GENERAL: Alert and in no acute distress. CARDIOPULMONARY: No increased WOB. Speaking in clear sentences.  PSYCH: Pleasant and cooperative. Speech normal rate and rhythm. Affect is appropriate. Insight and judgement are appropriate. Attention is focused, linear, and appropriate.  NEURO: Oriented as arrived to appointment on time with no prompting.   Attestation Statements:   Reviewed by clinician on day of visit: allergies, medications, problem list, medical history, surgical history, family history, social history, and previous encounter notes.  Time spent on visit including the items listed below was 30 minutes.  -preparing to see the patient (e.g., review of tests, history, previous notes) -obtaining and/or reviewing separately obtained history -counseling and educating the patient/family/caregiver -documenting clinical information in the electronic or other health record -ordering medications, tests, or procedures -independently interpreting results and communicating results to the patient/ family/caregiver -referring and communicating with other health care professionals  -care coordination   This was prepared with the assistance of Presenter, broadcasting.  Occasional wrong-word or sound-a-like substitutions may have occurred due to the inherent limitations of voice recognition software.

## 2023-03-10 ENCOUNTER — Telehealth (INDEPENDENT_AMBULATORY_CARE_PROVIDER_SITE_OTHER): Payer: BC Managed Care – PPO | Admitting: Family Medicine

## 2023-03-10 ENCOUNTER — Encounter (INDEPENDENT_AMBULATORY_CARE_PROVIDER_SITE_OTHER): Payer: Self-pay | Admitting: Family Medicine

## 2023-03-10 DIAGNOSIS — E785 Hyperlipidemia, unspecified: Secondary | ICD-10-CM

## 2023-03-10 DIAGNOSIS — E7849 Other hyperlipidemia: Secondary | ICD-10-CM

## 2023-03-10 DIAGNOSIS — Z6841 Body Mass Index (BMI) 40.0 and over, adult: Secondary | ICD-10-CM

## 2023-03-10 MED ORDER — WEGOVY 2.4 MG/0.75ML ~~LOC~~ SOAJ: 2.4000 mg | SUBCUTANEOUS | 0 refills | Status: DC

## 2023-03-11 ENCOUNTER — Telehealth: Payer: Self-pay

## 2023-03-11 NOTE — Telephone Encounter (Signed)
PA submitted through Cover My Meds for [facility]. Awaiting insurance determination.  Key: [address]

## 2023-03-11 NOTE — Telephone Encounter (Signed)
Received fax from [place] that Vanessa Copeland has been approved from 03/11/23-03/10/24.

## 2023-03-13 ENCOUNTER — Ambulatory Visit: Payer: BC Managed Care – PPO | Admitting: Family

## 2023-03-16 ENCOUNTER — Encounter: Payer: Self-pay | Admitting: Family

## 2023-03-19 ENCOUNTER — Other Ambulatory Visit (INDEPENDENT_AMBULATORY_CARE_PROVIDER_SITE_OTHER): Payer: Self-pay | Admitting: Family Medicine

## 2023-03-19 DIAGNOSIS — E559 Vitamin D deficiency, unspecified: Secondary | ICD-10-CM

## 2023-04-07 NOTE — Progress Notes (Unsigned)
TeleHealth Visit:  This visit was completed with telemedicine (audio/video) technology. Vanessa Copeland has verbally consented to this TeleHealth visit. The patient is located at home, the provider is located at home. The participants in this visit include the listed provider and patient. The visit was conducted today via MyChart video.  OBESITY Vanessa Copeland is here to discuss her progress with her obesity treatment plan along with follow-up of her obesity related diagnoses.    Today's visit was # 49  Starting weight: 256 lbs Starting date: 09/06/19 Weight reported at last virtual office visit: 226 lbs on 03/10/23 Today's reported weight (04/08/23): none reported Total weight loss: 30 Weight change since last visit: ?  Nutrition Plan: the Category 3 plan- 80% on plan.  Current exercise: none  Interim History:  She feels she is adhering to plan better. Still packing lunch for work.  She only eats one meal on Sundays- around 1-2 pm.  She has half sweet tea twice weekly. Goes out to dinner once weekly. Tries to keep snacks < 300 cal/day.   Eating all of the prescribed protein: yes- mostly.  Skipping meals: No Drinking adequate water: No, has improved.   Pharmacotherapy: Vanessa Copeland is on Wegovy 2.4 mg SQ weekly. Adverse side effects: Nausea- mild waves Hunger is well controlled.  Assessment/Plan:  1.  Low hemoglobin Hemoglobin has been low normal to low over the past 10 years.  Most recent hemoglobin was 11.3 and hematocrit was 34.7. Iron supplementation: none Lab Results  Component Value Date   WBC 9.3 12/02/2022   HGB 11.3 (L) 12/02/2022   HCT 34.7 (L) 12/02/2022   MCV 90.1 12/02/2022   PLT 297 12/02/2022  y  Lab Results  Component Value Date   IRON 57 12/27/2019   TIBC 321 12/27/2019   FERRITIN 51 12/27/2019     Plan: Check CBC with differential.  2. Hyperlipidemia LDL is at goal.  Most recent lipid profile on 08/20/2022: LDL 96, HDL slightly low at 46, triglycerides  normal at 74. Medication(s): Crestor 10 mg twice weekly Denies myalgias 10-year ASCVD risk score is 5.5%.  Lab Results  Component Value Date   CHOL 156 08/20/2022   HDL 46 08/20/2022   LDLCALC 96 08/20/2022   TRIG 74 08/20/2022   CHOLHDL 3.4 08/20/2022   CHOLHDL 4.0 08/29/2021   CHOLHDL 4.0 11/27/2020   Lab Results  Component Value Date   ALT 19 12/02/2022   AST 22 12/02/2022   ALKPHOS 73 12/02/2022   BILITOT 0.5 12/02/2022   The 10-year ASCVD risk score (Arnett DK, et al., 2019) is: 5.5%   Values used to calculate the score:     Age: 57 years     Sex: Female     Is Non-Hispanic African American: Yes     Diabetic: No     Tobacco smoker: No     Systolic Blood Pressure: 134 mmHg     Is BP treated: Yes     HDL Cholesterol: 46 mg/dL     Total Cholesterol: 156 mg/dL  Plan: Refill Crestor 10 mg twice weekly. Check fasting lipid panel.  3. Vitamin D Deficiency Vitamin D is at goal of 50.  Most recent vitamin D level was 50.9 on 11/17/2022. She is on  prescription ergocalciferol 50,000 IU weekly. Lab Results  Component Value Date   VD25OH 50.9 11/17/2022   VD25OH 52.1 08/20/2022   VD25OH 37.8 05/20/2022    Plan: Continue  prescription ergocalciferol 50,000 IU weekly  Prediabetes Last A1c was 5.6 on 11/17/2022.  A1c was 6.0 on 08/29/2021. Medication(s): Wegovy 2.4 mg SQ weekly Lab Results  Component Value Date   HGBA1C 5.6 11/17/2022   HGBA1C 5.9 (H) 08/20/2022   HGBA1C 5.4 05/20/2022   HGBA1C 5.7 (H) 01/16/2022   HGBA1C 6.0 (H) 08/29/2021   Lab Results  Component Value Date   INSULIN 10.8 11/17/2022   INSULIN 18.7 08/20/2022   INSULIN 11.4 05/20/2022   INSULIN 11.6 01/16/2022   INSULIN 19.4 08/29/2021    Plan: Continue and refill Wegovy 2.4 mg SQ weekly She will likely not be able to obtain this refill because coverage ended April 1 for GLP-1 therapy for the state health insurance plan.   Morbid Obesity: Current BMI 40 Pharmacotherapy Plan Continue  and refill Wegovy 2.4 mg SQ weekly.  Vanessa Copeland is currently in the action stage of change. As such, her goal is to continue with weight loss efforts.  She has agreed to the Category 3 plan  1.  Continue to reduce sugar sweetened beverages. 2.  Have glass of carb master chocolate milk before church and a frozen meal for dinner in the evening.. 3.  Go to the office 1 to 2 days before next visit to weigh and have labs drawn.  Exercise goals:  resume walking for exercise 25 minutes 3 times per week.  Behavioral modification strategies: increasing lean protein intake, decreasing simple carbohydrates , no meal skipping, meal planning , and planning for success.  Vanessa Copeland has agreed to follow-up with our clinic in 4 weeks.    Orders Placed This Encounter  Procedures   CBC with Differential/Platelet   Comprehensive metabolic panel   Hemoglobin A1c   Insulin, random   Lipid Panel With LDL/HDL Ratio   VITAMIN D 25 Hydroxy (Vit-D Deficiency, Fractures)    Medications Discontinued During This Encounter  Medication Reason   rosuvastatin (CRESTOR) 10 MG tablet Reorder   Semaglutide-Weight Management (WEGOVY) 2.4 MG/0.75ML SOAJ Reorder     Meds ordered this encounter  Medications   Semaglutide-Weight Management (WEGOVY) 2.4 MG/0.75ML SOAJ    Sig: Inject 2.4 mg into the skin once a week.    Dispense:  3 mL    Refill:  0    Order Specific Question:   Supervising Provider    Answer:   Glennis Brink [[id number]]   rosuvastatin (CRESTOR) 10 MG tablet    Sig: Take 1 tablet (10 mg total) by mouth 2 (two) times a week, Wednesday & Sunday.    Dispense:  30 tablet    Refill:  0    Order Specific Question:   Supervising Provider    Answer:   Glennis Brink [[id number]]      Objective:   VITALS: Per patient if applicable, see vitals. GENERAL: Alert and in no acute distress. CARDIOPULMONARY: No increased WOB. Speaking in clear sentences.  PSYCH: Pleasant and cooperative. Speech normal rate and rhythm. Affect  is appropriate. Insight and judgement are appropriate. Attention is focused, linear, and appropriate.  NEURO: Oriented as arrived to appointment on time with no prompting.   Attestation Statements:   Reviewed by clinician on day of visit: allergies, medications, problem list, medical history, surgical history, family history, social history, and previous encounter notes.  This was prepared with the assistance of Dragon Medical.  Occasional wrong-word or sound-a-like substitutions may have occurred due to the inherent limitations of voice recognition software.

## 2023-04-08 ENCOUNTER — Telehealth (INDEPENDENT_AMBULATORY_CARE_PROVIDER_SITE_OTHER): Payer: BC Managed Care – PPO | Admitting: Family Medicine

## 2023-04-08 ENCOUNTER — Encounter (INDEPENDENT_AMBULATORY_CARE_PROVIDER_SITE_OTHER): Payer: Self-pay | Admitting: Family Medicine

## 2023-04-08 DIAGNOSIS — Z6841 Body Mass Index (BMI) 40.0 and over, adult: Secondary | ICD-10-CM

## 2023-04-08 DIAGNOSIS — E785 Hyperlipidemia, unspecified: Secondary | ICD-10-CM | POA: Diagnosis not present

## 2023-04-08 DIAGNOSIS — E7849 Other hyperlipidemia: Secondary | ICD-10-CM

## 2023-04-08 DIAGNOSIS — D649 Anemia, unspecified: Secondary | ICD-10-CM

## 2023-04-08 DIAGNOSIS — E559 Vitamin D deficiency, unspecified: Secondary | ICD-10-CM

## 2023-04-08 DIAGNOSIS — R7303 Prediabetes: Secondary | ICD-10-CM

## 2023-04-08 MED ORDER — WEGOVY 2.4 MG/0.75ML ~~LOC~~ SOAJ: 2.4000 mg | SUBCUTANEOUS | 0 refills | Status: DC

## 2023-04-08 MED ORDER — ROSUVASTATIN CALCIUM 10 MG PO TABS: 10.0000 mg | ORAL_TABLET | ORAL | 0 refills | Status: DC

## 2023-04-09 ENCOUNTER — Encounter (INDEPENDENT_AMBULATORY_CARE_PROVIDER_SITE_OTHER): Payer: Self-pay | Admitting: Family Medicine

## 2023-04-09 DIAGNOSIS — F3289 Other specified depressive episodes: Secondary | ICD-10-CM

## 2023-04-14 MED ORDER — ZEPBOUND 5 MG/0.5ML ~~LOC~~ SOAJ: 5.0000 mg | SUBCUTANEOUS | 0 refills | Status: DC

## 2023-04-14 NOTE — Telephone Encounter (Signed)
Spoke with [facility] on the phone.  They said to her Zepbound is covered at $502 per month and Reginal Lutes is covered $1200 per month.  Called patient and advised her to download the coupon for Zepbound.  Prescription sent for Zepbound 5 mg weekly.

## 2023-04-22 MED ORDER — TIRZEPATIDE 5 MG/0.5ML ~~LOC~~ SOAJ: 5.0000 mg | SUBCUTANEOUS | 0 refills | Status: DC

## 2023-04-22 NOTE — Addendum Note (Signed)
Addended by: Jesse Sans on: 04/22/2023 02:13 PM   Modules accepted: Orders

## 2023-04-23 ENCOUNTER — Telehealth: Payer: Self-pay

## 2023-04-23 NOTE — Telephone Encounter (Signed)
Received fax from [facility] [employer] that Vanessa Copeland is denied. "Your plan only covers this drug when used for type 2 diabetes"

## 2023-04-23 NOTE — Telephone Encounter (Signed)
PA submitted through Cover My Meds for [facility]. Awaiting insurance determination.  Key: [id number]

## 2023-05-06 ENCOUNTER — Telehealth (INDEPENDENT_AMBULATORY_CARE_PROVIDER_SITE_OTHER): Payer: BC Managed Care – PPO | Admitting: Family Medicine

## 2023-05-20 ENCOUNTER — Telehealth (INDEPENDENT_AMBULATORY_CARE_PROVIDER_SITE_OTHER): Payer: BC Managed Care – PPO | Admitting: Family Medicine

## 2023-05-20 ENCOUNTER — Encounter (INDEPENDENT_AMBULATORY_CARE_PROVIDER_SITE_OTHER): Payer: Self-pay | Admitting: Family Medicine

## 2023-05-20 VITALS — Ht 63.0 in | Wt 232.0 lb

## 2023-05-20 DIAGNOSIS — Z6841 Body Mass Index (BMI) 40.0 and over, adult: Secondary | ICD-10-CM

## 2023-05-20 DIAGNOSIS — E559 Vitamin D deficiency, unspecified: Secondary | ICD-10-CM | POA: Diagnosis not present

## 2023-05-20 DIAGNOSIS — K760 Fatty (change of) liver, not elsewhere classified: Secondary | ICD-10-CM | POA: Diagnosis not present

## 2023-05-20 MED ORDER — VITAMIN D (ERGOCALCIFEROL) 1.25 MG (50000 UNIT) PO CAPS: 50000.0000 [IU] | ORAL_CAPSULE | ORAL | 0 refills | Status: DC

## 2023-05-20 MED ORDER — WEGOVY 0.5 MG/0.5ML ~~LOC~~ SOAJ: 0.5000 mg | SUBCUTANEOUS | 0 refills | Status: DC

## 2023-05-20 NOTE — Progress Notes (Signed)
TeleHealth Visit:  This visit was completed with telemedicine (audio/video) technology. Vanessa Copeland has verbally consented to this TeleHealth visit. The patient is located at home, the provider is located at home. The participants in this visit include the listed provider and patient. The visit was conducted today via MyChart video.  Vanessa Copeland is here to discuss her progress with her Vanessa treatment plan along with follow-up of her Vanessa related diagnoses.    Today's visit was # 50  Starting weight: 256 lbs Starting date: 09/06/19 Weight at last in office visit on 03/09/2023: 226 pounds Today's reported weight (05/20/23):  232 lbs Total weight loss: 24 lbs Weight change since last visit: +6 lbs  Nutrition Plan: the Category 3 plan - 75% adherence.  Current exercise:  some walking  Interim History:  Has noted increased cravings and hunger since she has been off of Wegovy. Last dose was 2-3 months ago when she lost insurance coverage.  Last prescription was for the 2.4 mg dose.  She is not skipping meals. Breakfast- 2 eggs and [place] sausage Lunch- salad with protein, ranch dressing Dinner -  protein and or salad or vegetable She feels like she is snacking too much She has tried to drink more water.  She plans on walking in the am when she has more energy.   Assessment/Plan:  1. Vitamin D Deficiency Vitamin D is at goal of 50.  Most recent vitamin D level was 50 on 11/17/2022.Marland Kitchen She is on  prescription ergocalciferol 50,000 IU weekly. Lab Results  Component Value Date   VD25OH 50.9 11/17/2022   VD25OH 52.1 08/20/2022   VD25OH 37.8 05/20/2022    Plan: Continue and refill  prescription ergocalciferol 50,000 IU weekly She will have labs drawn within the next few days.  Lab orders entered at last visit.  2. Nonalcoholic fatty liver disease  Shehas been diagnosed with nonalcoholic fatty liver disease. Most recent liver enzymes are normal. She continues to work  on weight loss to improve this condition. Denies abdominal pain or jaundice.  Lab Results  Component Value Date   ALT 19 12/02/2022   AST 22 12/02/2022   ALKPHOS 73 12/02/2022   BILITOT 0.5 12/02/2022    Plan: Continue to monitor liver enzymes. Check liver enzymes before next visit. Continue to work on weight loss with meal plan and exercise.   3. Morbid Vanessa: Current BMI 41  Pharmacotherapy Plan Start Wegovy 0.50 mg SQ weekly.   Letter written to appeal insurance's decision to deny coverage of Wegovy.  Vanessa Copeland is currently in the action stage of change. As such, her goal is to continue with weight loss efforts.  She has agreed to the Category 3 plan.  1.  She will begin using a low calorie salad dressing. 2.  Reduce intake of carbohydrates.  Exercise goals:  walk 30 minutes 5 days per week.  Behavioral modification strategies: increasing lean protein intake, decreasing simple carbohydrates , meal planning , and planning for success.  Vanessa Copeland has agreed to follow-up with our clinic in 4 weeks.  No orders of the defined types were placed in this encounter.   Medications Discontinued During This Encounter  Medication Reason   tirzepatide [facility]) 5 MG/0.5ML Pen Cost of medication   Vitamin D, Ergocalciferol, (DRISDOL) 1.25 MG (50000 UNIT) CAPS capsule Reorder     Meds ordered this encounter  Medications   Semaglutide-Weight Management (WEGOVY) 0.5 MG/0.5ML SOAJ    Sig: Inject 0.5 mg into the skin once a week.  Dispense:  2 mL    Refill:  0    Order Specific Question:   Supervising Provider    Answer:   Carolin Sicks   Vitamin D, Ergocalciferol, (DRISDOL) 1.25 MG (50000 UNIT) CAPS capsule    Sig: Take 1 capsule (50,000 Units total) by mouth every 7 (seven) days.    Dispense:  4 capsule    Refill:  0    Order Specific Question:   Supervising Provider    Answer:   Glennis Brink [[id number]]      Objective:   VITALS: Per patient if applicable, see  vitals. GENERAL: Alert and in no acute distress. CARDIOPULMONARY: No increased WOB. Speaking in clear sentences.  PSYCH: Pleasant and cooperative. Speech normal rate and rhythm. Affect is appropriate. Insight and judgement are appropriate. Attention is focused, linear, and appropriate.  NEURO: Oriented as arrived to appointment on time with no prompting.   Attestation Statements:   Reviewed by clinician on day of visit: allergies, medications, problem list, medical history, surgical history, family history, social history, and previous encounter notes.   This was prepared with the assistance of Dragon [employer].  Occasional wrong-word or sound-a-like substitutions may have occurred due to the inherent limitations of voice recognition software.

## 2023-05-28 ENCOUNTER — Other Ambulatory Visit (INDEPENDENT_AMBULATORY_CARE_PROVIDER_SITE_OTHER): Payer: Self-pay | Admitting: Family Medicine

## 2023-05-29 ENCOUNTER — Ambulatory Visit: Payer: BC Managed Care – PPO | Admitting: Medical

## 2023-05-29 VITALS — BP 110/70 | HR 68 | Temp 98.4°F | Wt 242.0 lb

## 2023-05-29 DIAGNOSIS — R195 Other fecal abnormalities: Secondary | ICD-10-CM

## 2023-05-29 DIAGNOSIS — B349 Viral infection, unspecified: Secondary | ICD-10-CM

## 2023-05-29 DIAGNOSIS — R300 Dysuria: Secondary | ICD-10-CM

## 2023-05-29 DIAGNOSIS — Z87442 Personal history of urinary calculi: Secondary | ICD-10-CM

## 2023-05-29 DIAGNOSIS — R109 Unspecified abdominal pain: Secondary | ICD-10-CM

## 2023-05-29 DIAGNOSIS — R3129 Other microscopic hematuria: Secondary | ICD-10-CM

## 2023-05-29 LAB — POCT URINALYSIS DIP (PROADVANTAGE DEVICE)
Bilirubin, UA: NEGATIVE
Glucose, UA: NEGATIVE mg/dL
Ketones, POC UA: NEGATIVE mg/dL
Leukocytes, UA: NEGATIVE
Nitrite, UA: NEGATIVE
Protein Ur, POC: NEGATIVE mg/dL
Specific Gravity, Urine: 1.03
Urobilinogen, Ur: NEGATIVE
pH, UA: 6 (ref 5.0–8.0)

## 2023-05-29 NOTE — Progress Notes (Signed)
Subjective:   Vanessa Copeland is a 57 y.o. female who complains of possible urinary tract infection.   Chief Complaint  Patient presents with   Urinary Tract Infection    Diarrhea since Wednesday,painful with urination alittle but Feeling better today, just felt off.    Here for concerns, but think she is on the improvement side of things.  2 days ago was feeling "off", was having diarrhea, but today improving. Was having loose stools 3-4 times daily the last few days.   No nausea, on vomiting, no fever, no body aches, no chills.  No belly or back pain other than bubble gut /cramping.  No recent foods of concern for infection, on recent travel.   Husband has felt similar.  She notes some discomfort with urination.  Having some urinary frequency, some urgency.  No blood or odor in urine.  No prior UTI.     Patient does not have a history of recurrent UTI.   Patient does not have a history of pyelonephritis.    They endorse recent change in soap or hygiene products, a vaginal soap.   They deny discharge or suspected genital infection.  No other aggravating or relieving factors.  No other c/o.  Past Medical History:  Diagnosis Date   Allergy    Anemia    Anxiety    Asthma    mild intermittent(when allergies are flaring)   Complication of anesthesia    Food allergy    Shellfish   H/O echocardiogram 12/2005   Trace MR, mild TR, nl L ventricular systolic function.   Heart murmur    since birth. Never has had any problems   Hepatic steatosis    per CT   History of kidney stones    Kidney stones    Morbid obesity (HCC) 04/29/2019   Obesity, unspecified    PONV (postoperative nausea and vomiting)    Vitamin D deficiency 01/2010    Current Outpatient Medications on File Prior to Visit  Medication Sig Dispense Refill   albuterol (VENTOLIN HFA) 108 (90 Base) MCG/ACT inhaler Inhale 2 puffs into the lungs every 6 (six) hours as needed for wheezing or shortness of breath. 18 g 1    EPINEPHrine 0.3 mg/0.3 mL IJ SOAJ injection Inject 0.3 mg into the muscle as needed for anaphylaxis. 1 each 0   fluticasone (FLONASE) 50 MCG/ACT nasal spray Place 2 sprays into both nostrils daily. 16 g 6   ibuprofen (ADVIL) 800 MG tablet Take 1 tablet (800 mg total) by mouth every 8 (eight) hours as needed for jaw pain 30 tablet 1   levocetirizine (XYZAL) 5 MG tablet Take 1 tablet (5 mg total) by mouth every evening. 30 tablet 2   levonorgestrel (MIRENA) 20 MCG/24HR IUD 1 each by Intrauterine route once.     rosuvastatin (CRESTOR) 10 MG tablet Take 1 tablet (10 mg total) by mouth 2 (two) times a week, Wednesday & Sunday. 30 tablet 0   Vitamin D, Ergocalciferol, (DRISDOL) 1.25 MG (50000 UNIT) CAPS capsule Take 1 capsule (50,000 Units total) by mouth every 7 (seven) days. 4 capsule 0   No current facility-administered medications on file prior to visit.    ROS as in subjective  Reviewed allergies, medications, past medical, surgical, and social history.     Objective: BP 110/70   Pulse 68   Temp 98.4 F (36.9 C)   Wt 242 lb (109.8 kg)   BMI 42.87 kg/m   General appearance: alert, no distress, WD/WN,  female Abdomen: +bs, soft, non tender, non distended, no masses, no hepatomegaly, no splenomegaly, no bruits Back: no CVA tenderness GU: deferred     Assessment: Encounter Diagnoses  Name Primary?   Loose stools Yes   Dysuria    Abdominal cramping    Viral syndrome    Microscopic hematuria    History of kidney stones      Plan: We discussed her symptoms and concerns.  Urinalysis shows microscopic blood but she has been kidney stones passed in December and had skin findings of stones.  I suspect that not all the stone particles came  out.  No obvious infection today in the urinary tract  I suspect she had a recent viral stomach bug gastroenteritis the past few days  Advised rest, hydration, bland foods for the next few days, nothing spicy or heavy.  Consider taking  over-the-counter emergenC vitamins the next few days.  If not much improved over the next few days or new symptoms then call or recheck   Nashla was seen today for urinary tract infection.  Diagnoses and all orders for this visit:  Loose stools  Dysuria -     POCT Urinalysis DIP (Proadvantage Device)  Abdominal cramping  Viral syndrome  Microscopic hematuria  History of kidney stones    Return 1-2 wk nurse visit for clean catch UA.

## 2023-06-03 ENCOUNTER — Other Ambulatory Visit (INDEPENDENT_AMBULATORY_CARE_PROVIDER_SITE_OTHER): Payer: Self-pay | Admitting: Family Medicine

## 2023-06-03 DIAGNOSIS — E559 Vitamin D deficiency, unspecified: Secondary | ICD-10-CM

## 2023-06-04 ENCOUNTER — Other Ambulatory Visit (INDEPENDENT_AMBULATORY_CARE_PROVIDER_SITE_OTHER): Payer: Self-pay | Admitting: Family Medicine

## 2023-06-04 MED ORDER — ESCITALOPRAM OXALATE 10 MG PO TABS: 10.0000 mg | ORAL_TABLET | Freq: Every day | ORAL | 0 refills | Status: DC

## 2023-06-04 NOTE — Addendum Note (Signed)
Addended by: Jesse Sans on: 06/04/2023 01:56 PM   Modules accepted: Orders

## 2023-06-04 NOTE — Telephone Encounter (Signed)
Please advise. Patient is requesting a refill ( Escitalopram 10MG ). Medication is out dated since October last year.

## 2023-06-17 ENCOUNTER — Other Ambulatory Visit: Payer: BC Managed Care – PPO

## 2023-06-17 ENCOUNTER — Telehealth (INDEPENDENT_AMBULATORY_CARE_PROVIDER_SITE_OTHER): Payer: BC Managed Care – PPO | Admitting: Family Medicine

## 2023-07-10 ENCOUNTER — Telehealth: Payer: Self-pay | Admitting: Medical

## 2023-07-10 DIAGNOSIS — J302 Other seasonal allergic rhinitis: Secondary | ICD-10-CM

## 2023-07-10 LAB — CBC WITH DIFFERENTIAL/PLATELET
Basophils Absolute: 0 10*3/uL (ref 0.0–0.2)
Basos: 0 %
EOS (ABSOLUTE): 0.1 10*3/uL (ref 0.0–0.4)
Eos: 2 %
Hematocrit: 35.5 % (ref 34.0–46.6)
Hemoglobin: 11.6 g/dL (ref 11.1–15.9)
Immature Grans (Abs): 0 10*3/uL (ref 0.0–0.1)
Immature Granulocytes: 0 %
Lymphocytes Absolute: 2.7 10*3/uL (ref 0.7–3.1)
Lymphs: 40 %
MCH: 29.1 pg (ref 26.6–33.0)
MCHC: 32.7 g/dL (ref 31.5–35.7)
MCV: 89 fL (ref 79–97)
Monocytes Absolute: 0.5 10*3/uL (ref 0.1–0.9)
Monocytes: 7 %
Neutrophils Absolute: 3.4 10*3/uL (ref 1.4–7.0)
Neutrophils: 51 %
Platelets: 149 10*3/uL — ABNORMAL LOW (ref 150–450)
RBC: 3.98 x10E6/uL (ref 3.77–5.28)
RDW: 13.6 % (ref 11.7–15.4)
WBC: 6.7 10*3/uL (ref 3.4–10.8)

## 2023-07-10 LAB — COMPREHENSIVE METABOLIC PANEL
ALT: 33 IU/L — ABNORMAL HIGH (ref 0–32)
AST: 26 IU/L (ref 0–40)
Albumin: 4.2 g/dL (ref 3.8–4.9)
Alkaline Phosphatase: 118 IU/L (ref 44–121)
BUN/Creatinine Ratio: 14 (ref 9–23)
BUN: 11 mg/dL (ref 6–24)
Bilirubin Total: 0.6 mg/dL (ref 0.0–1.2)
CO2: 27 mmol/L (ref 20–29)
Calcium: 9.2 mg/dL (ref 8.7–10.2)
Chloride: 103 mmol/L (ref 96–106)
Creatinine, Ser: 0.8 mg/dL (ref 0.57–1.00)
Globulin, Total: 2.5 g/dL (ref 1.5–4.5)
Glucose: 82 mg/dL (ref 70–99)
Potassium: 4.6 mmol/L (ref 3.5–5.2)
Sodium: 142 mmol/L (ref 134–144)
Total Protein: 6.7 g/dL (ref 6.0–8.5)
eGFR: 87 mL/min/{1.73_m2} (ref >59)

## 2023-07-10 LAB — LIPID PANEL WITH LDL/HDL RATIO
Cholesterol, Total: 188 mg/dL (ref 100–199)
HDL: 53 mg/dL (ref >39)
LDL Chol Calc (NIH): 120 mg/dL — ABNORMAL HIGH (ref 0–99)
LDL/HDL Ratio: 2.3 ratio (ref 0.0–3.2)
Triglycerides: 79 mg/dL (ref 0–149)
VLDL Cholesterol Cal: 15 mg/dL (ref 5–40)

## 2023-07-10 LAB — HEMOGLOBIN A1C
Est. average glucose Bld gHb Est-mCnc: 120 mg/dL
Hgb A1c MFr Bld: 5.8 % — ABNORMAL HIGH (ref 4.8–5.6)

## 2023-07-10 LAB — VITAMIN D 25 HYDROXY (VIT D DEFICIENCY, FRACTURES): Vit D, 25-Hydroxy: 57.1 ng/mL (ref 30.0–100.0)

## 2023-07-10 LAB — INSULIN, RANDOM: INSULIN: 9.4 u[IU]/mL (ref 2.6–24.9)

## 2023-07-10 MED ORDER — FLUTICASONE PROPIONATE 50 MCG/ACT NA SUSP: 2.0000 | Freq: Every day | NASAL | 0 refills | Status: DC

## 2023-07-10 NOTE — Telephone Encounter (Signed)
refilled 

## 2023-07-10 NOTE — Telephone Encounter (Signed)
Pt requesting refill on flonase   [facility]  [place]

## 2023-07-12 NOTE — Progress Notes (Addendum)
TeleHealth Visit:  This visit was completed with telemedicine (audio/video) technology. Vanessa Copeland has verbally consented to this TeleHealth visit. The patient is located at home, the provider is located at home. The participants in this visit include the listed provider and patient. The visit was conducted today via MyChart video.  OBESITY Vanessa Copeland is here to discuss her progress with her obesity treatment plan along with follow-up of her obesity related diagnoses.    Today's visit was # 51  Starting weight: 256 lbs Starting date: 09/06/19 Weight reported at last virtual office visit: 232 lbs on 05/20/23 Today's reported weight (07/13/23):  245 lbs Total weight loss: 11 lbs Weight change since last visit: 13 lbs  Nutrition Plan: the Category 3 plan - 0% adherence.  Current exercise: walking   Interim History:  She has gained has gained 13 pounds since her last visit.  She has really struggles with plan adherence since being off of the [facility] due to lack of insurance coverage. She has not been following a plan. Her daughter has been journaling and Brena wants to try it.  She also plans on walking with her daughter for 30 minutes 3 times per week.  Assessment/Plan:  We discussed recent lab results in depth.  1. Anxiety Stable on Lexapro.  Reports the Lexapro has really helped her anxiety.  Plan: Refill Lexapro 10 mg daily.  2. Hyperlipidemia LDL is not at goal.  LDL is 120, increased from 96 on 08/20/2022.  HDL and triglycerides are normal. Medication(s): Crestor 10 mg 2 times per week.  Lab Results  Component Value Date   CHOL 188 07/08/2023   HDL 53 07/08/2023   LDLCALC 120 (H) 07/08/2023   TRIG 79 07/08/2023   CHOLHDL 3.4 08/20/2022   CHOLHDL 4.0 08/29/2021   CHOLHDL 4.0 11/27/2020   Lab Results  Component Value Date   ALT 33 (H) 07/08/2023   AST 26 07/08/2023   ALKPHOS 118 07/08/2023   BILITOT 0.6 07/08/2023   The 10-year ASCVD risk score (Arnett DK, et  al., 2019) is: 1.9%   Values used to calculate the score:     Age: 57 years     Sex: Female     Is Non-Hispanic African American: Yes     Diabetic: No     Tobacco smoker: No     Systolic Blood Pressure: 110 mmHg     Is BP treated: No     HDL Cholesterol: 53 mg/dL     Total Cholesterol: 188 mg/dL  Plan: Continue and refill Crestor 10 mg.  Increase dose to daily rather than 2 times per week. She will work on increasing exercise and getting back on plan.  3. Vitamin D Deficiency Vitamin D is at goal of 50.  Most recent vitamin D level was 57. She is on  prescription ergocalciferol 50,000 IU weekly. Lab Results  Component Value Date   VD25OH 57.1 07/08/2023   VD25OH 50.9 11/17/2022   VD25OH 52.1 08/20/2022    Plan: Continue and refill  prescription ergocalciferol 50,000 IU weekly   4. Morbid Obesity: Current BMI 43  Pharmacotherapy Plan Start Contrave ER 8-90 mg 2 tablets twice daily. Discussed possible side effects.  She denies history of glaucoma or seizures. Advised that naltrexone is an opioid blocker and opioids will not be effective should she need for an injury without discontinuing Contrave.  [name] is currently in the action stage of change. As such, her goal is to continue with weight loss efforts.  She has agreed  to keeping a food journal with goal of 1400-1600 calories and 100 grams of protein daily.  Exercise goals: Walk 30 minutes 3 times per week.  Behavioral modification strategies: increasing lean protein intake, decreasing simple carbohydrates , keep a strict food journal, and increase frequency of journaling.  Moneisha has agreed to follow-up with our clinic in 4 weeks.  No orders of the defined types were placed in this encounter.   Medications Discontinued During This Encounter  Medication Reason   Vitamin D, Ergocalciferol, (DRISDOL) 1.25 MG (50000 UNIT) CAPS capsule Reorder   rosuvastatin (CRESTOR) 10 MG tablet Reorder   escitalopram (LEXAPRO) 10  MG tablet Reorder     Meds ordered this encounter  Medications   Naltrexone-buPROPion HCl ER 8-90 MG TB12    Sig: Start 1 tablet every morning for 7 days, then 1 tablet twice daily for 7 days, then 2 tablets every morning and one in the evening for 1 week and then 2 tablets twice daily.    Dispense:  120 tablet    Refill:  0    Order Specific Question:   Supervising Provider    Answer:   Seymour Bars E [[id number]]   Vitamin D, Ergocalciferol, (DRISDOL) 1.25 MG (50000 UNIT) CAPS capsule    Sig: Take 1 capsule (50,000 Units total) by mouth every 7 (seven) days.    Dispense:  12 capsule    Refill:  0    Order Specific Question:   Supervising Provider    Answer:   Glennis Brink [[id number]]   rosuvastatin (CRESTOR) 10 MG tablet    Sig: Take 1 tablet (10 mg total) by mouth daily.    Dispense:  90 tablet    Refill:  0    Order Specific Question:   Supervising Provider    Answer:   Glennis Brink [[id number]]   escitalopram (LEXAPRO) 10 MG tablet    Sig: Take 1 tablet (10 mg total) by mouth daily.    Dispense:  90 tablet    Refill:  0    Order Specific Question:   Supervising Provider    Answer:   Glennis Brink [[id number]]      Objective:   VITALS: Per patient if applicable, see vitals. GENERAL: Alert and in no acute distress. CARDIOPULMONARY: No increased WOB. Speaking in clear sentences.  PSYCH: Pleasant and cooperative. Speech normal rate and rhythm. Affect is appropriate. Insight and judgement are appropriate. Attention is focused, linear, and appropriate.  NEURO: Oriented as arrived to appointment on time with no prompting.   Attestation Statements:   Reviewed by clinician on day of visit: allergies, medications, problem list, medical history, surgical history, family history, social history, and previous encounter notes.  This was prepared with the assistance of Dragon [employer].  Occasional wrong-word or sound-a-like substitutions may have occurred due to the inherent limitations of voice  recognition software.

## 2023-07-13 ENCOUNTER — Telehealth (INDEPENDENT_AMBULATORY_CARE_PROVIDER_SITE_OTHER): Payer: BC Managed Care – PPO | Admitting: Family Medicine

## 2023-07-13 ENCOUNTER — Encounter (INDEPENDENT_AMBULATORY_CARE_PROVIDER_SITE_OTHER): Payer: Self-pay | Admitting: Family Medicine

## 2023-07-13 VITALS — Ht 63.0 in | Wt 245.0 lb

## 2023-07-13 DIAGNOSIS — F419 Anxiety disorder, unspecified: Secondary | ICD-10-CM

## 2023-07-13 DIAGNOSIS — E785 Hyperlipidemia, unspecified: Secondary | ICD-10-CM | POA: Diagnosis not present

## 2023-07-13 DIAGNOSIS — E559 Vitamin D deficiency, unspecified: Secondary | ICD-10-CM

## 2023-07-13 DIAGNOSIS — Z6841 Body Mass Index (BMI) 40.0 and over, adult: Secondary | ICD-10-CM

## 2023-07-13 DIAGNOSIS — E7849 Other hyperlipidemia: Secondary | ICD-10-CM

## 2023-07-13 MED ORDER — ROSUVASTATIN CALCIUM 10 MG PO TABS
10.0000 mg | ORAL_TABLET | Freq: Every day | ORAL | 0 refills | Status: DC
Start: 2023-07-13 — End: 2024-01-21

## 2023-07-13 MED ORDER — ESCITALOPRAM OXALATE 10 MG PO TABS
10.0000 mg | ORAL_TABLET | Freq: Every day | ORAL | 0 refills | Status: DC
Start: 2023-07-13 — End: 2023-11-25

## 2023-07-13 MED ORDER — NALTREXONE-BUPROPION HCL ER 8-90 MG PO TB12
ORAL_TABLET | ORAL | 0 refills | Status: DC
Start: 2023-07-13 — End: 2023-08-10

## 2023-07-13 MED ORDER — VITAMIN D (ERGOCALCIFEROL) 1.25 MG (50000 UNIT) PO CAPS
50000.0000 [IU] | ORAL_CAPSULE | ORAL | 0 refills | Status: DC
Start: 2023-07-13 — End: 2024-01-21

## 2023-07-14 ENCOUNTER — Telehealth: Payer: Self-pay

## 2023-07-14 NOTE — Telephone Encounter (Signed)
PA submitted through Cover My Meds for Contrave. Awaiting insurance determination. Key: [id number]

## 2023-07-15 NOTE — Telephone Encounter (Signed)
[facility]  received a request from your provider for coverage of Contrave (naltrexone-bupropion ER). The request was denied because: Your plan only covers this drug when you meet one of these options: A) You have tried other drugs your plan covers (preferred drugs), and they did not work well for you, or B) Your doctor gives [place] a medical reason you cannot take those other drugs. For your plan, you may need to try up to three preferred drugs. We have denied your request because you do not meet any of these conditions. We reviewed the information we had. Your request has been denied. Your doctor can send [place] any new or missing information for [place] to review. The preferred drugs for your plan are: orlistat, QSYMIA, SAXENDA, WEGOVY, ZEPBOUND. (Requirement: 3 in a class with 3 or more alternatives, 2 in a class with 2 alternatives, or 1 in a class with only 1 alternative.). Your doctor may need to get approval from your plan for preferred drugs. For this drug, you may have to meet other criteria. You can request the drug policy for more details. You can also request other plan documents for your review.  Patient has been notified.

## 2023-08-01 ENCOUNTER — Other Ambulatory Visit: Payer: Self-pay | Admitting: Medical

## 2023-08-01 DIAGNOSIS — J302 Other seasonal allergic rhinitis: Secondary | ICD-10-CM

## 2023-08-06 NOTE — Progress Notes (Signed)
TeleHealth Visit:  This visit was completed with telemedicine (audio/video) technology. Vanessa Copeland has verbally consented to this TeleHealth visit. The patient is located at home, the provider is located at home. The participants in this visit include the listed provider and patient. The visit was conducted today via MyChart video.  OBESITY Vanessa Copeland is here to discuss her progress with her obesity treatment plan along with follow-up of her obesity related diagnoses.    Today's visit was # 52  Starting weight: 256 lbs Starting date: 09/06/19 Weight reported at last virtual office visit: 245 lbs on 07/13/23 Today's reported weight (08/10/23):  244 lbs Total weight loss: 12 lbs Weight change since last visit: -1 lbs  Nutrition Plan: keeping a food journal with goal of 1400-1600 calories and 100 grams of protein daily   Current exercise: walking for 20 minutes 2 days per week  Interim History:  She may be getting [place] insurance through her husband and she believes that they cover antiobesity medications. She has struggled with polyphagia since losing coverage for [employer] [facility] in April 2024. Her low weight since starting in our clinic in 2020 was 226 pounds on 03/09/2023.-She was on Wegovy 1.7 mg weekly.  She has gained 18 pounds since then. Contrave prescribed last office visit but coverage was denied. She is taking apple cider vinegar which she feels is helping appetite.  We switched her to journaling last office visit but she fell off quickly from doing this.   She still wants to try journaling because her daughter has been successful with it.  Her daughter is also a patient in our clinic.  Assessment/Plan:  1. Hyperlipidemia LDL is not at goal. Per last lipid profile: LDL is elevated at 120, HDL normal at 53, triglycerides normal at 79. Medication(s): Prescribed Crestor 10 mg daily.  Has been taking it every other day.  Previously she had been taking it 2 days/week. Reports she has had a  few joint aches but feels this may not be from the Crestor. Cardiovascular risk factors: dyslipidemia, obesity (BMI >= 30 kg/m2), and sedentary lifestyle ASCVD 10-year risk score is 1.9%.  Lab Results  Component Value Date   CHOL 188 07/08/2023   HDL 53 07/08/2023   LDLCALC 120 (H) 07/08/2023   TRIG 79 07/08/2023   CHOLHDL 3.4 08/20/2022   CHOLHDL 4.0 08/29/2021   CHOLHDL 4.0 11/27/2020   Lab Results  Component Value Date   ALT 33 (H) 07/08/2023   AST 26 07/08/2023   ALKPHOS 118 07/08/2023   BILITOT 0.6 07/08/2023   The 10-year ASCVD risk score (Arnett DK, et al., 2019) is: 1.9%   Values used to calculate the score:     Age: 57 years     Sex: Female     Is Non-Hispanic African American: Yes     Diabetic: No     Tobacco smoker: No     Systolic Blood Pressure: 110 mmHg     Is BP treated: No     HDL Cholesterol: 53 mg/dL     Total Cholesterol: 188 mg/dL  Plan: May continue Crestor 10 mg every other day versus daily.  2. Polyphagia Currently this is poorly controlled. Medication(s): None.   Contrave denied by insurance plan  Plan: Start bupropion XL 150 mg daily. Start naltrexone 50 mg-patient to take 1/2 pill daily. Denies history of glaucoma or seizures.  3. Morbid Obesity: Current BMI 43  Vanessa Copeland is currently in the action stage of change. As such, her goal is to continue with  weight loss efforts.  She has agreed to keeping a food journal with goal of 1400-1600 calories and 100 grams of protein daily.  1.  Encouraged her to journal consistently.  Advised that with consistently journaling and meeting goals she will lose weight.  Exercise goals: Increase walking to 30 minutes 3 times per week.  Behavioral modification strategies: increasing lean protein intake, decreasing simple carbohydrates , keep a strict food journal, and increase frequency of journaling.  Vanessa Copeland has agreed to follow-up with our clinic in 6 weeks.  No orders of the defined types were placed  in this encounter.   Medications Discontinued During This Encounter  Medication Reason   Naltrexone-buPROPion HCl ER 8-90 MG TB12 Cost of medication     Meds ordered this encounter  Medications   buPROPion (WELLBUTRIN XL) 150 MG 24 hr tablet    Sig: Take 1 tablet (150 mg total) by mouth daily.    Dispense:  90 tablet    Refill:  0    Order Specific Question:   Supervising Provider    Answer:   Glennis Brink [[id number]]   naltrexone (DEPADE) 50 MG tablet    Sig: Take 0.5 tablets (25 mg total) by mouth daily.    Dispense:  30 tablet    Refill:  0    Order Specific Question:   Supervising Provider    Answer:   Glennis Brink [[id number]]      Objective:   VITALS: Per patient if applicable, see vitals. GENERAL: Alert and in no acute distress. CARDIOPULMONARY: No increased WOB. Speaking in clear sentences.  PSYCH: Pleasant and cooperative. Speech normal rate and rhythm. Affect is appropriate. Insight and judgement are appropriate. Attention is focused, linear, and appropriate.  NEURO: Oriented as arrived to appointment on time with no prompting.   Attestation Statements:   Reviewed by clinician on day of visit: allergies, medications, problem list, medical history, surgical history, family history, social history, and previous encounter notes.  This was prepared with the assistance of [employer].  Occasional wrong-word or sound-a-like substitutions may have occurred due to the inherent limitations of voice recognition software.  \

## 2023-08-10 ENCOUNTER — Telehealth (INDEPENDENT_AMBULATORY_CARE_PROVIDER_SITE_OTHER): Payer: BC Managed Care – PPO | Admitting: Family Medicine

## 2023-08-10 ENCOUNTER — Encounter (INDEPENDENT_AMBULATORY_CARE_PROVIDER_SITE_OTHER): Payer: Self-pay | Admitting: Family Medicine

## 2023-08-10 VITALS — Ht 63.0 in | Wt 244.0 lb

## 2023-08-10 DIAGNOSIS — E7849 Other hyperlipidemia: Secondary | ICD-10-CM

## 2023-08-10 DIAGNOSIS — R632 Polyphagia: Secondary | ICD-10-CM | POA: Diagnosis not present

## 2023-08-10 DIAGNOSIS — Z6841 Body Mass Index (BMI) 40.0 and over, adult: Secondary | ICD-10-CM | POA: Diagnosis not present

## 2023-08-10 DIAGNOSIS — E785 Hyperlipidemia, unspecified: Secondary | ICD-10-CM | POA: Diagnosis not present

## 2023-08-10 MED ORDER — NALTREXONE HCL 50 MG PO TABS: 25.0000 mg | ORAL_TABLET | Freq: Every day | ORAL | 0 refills | Status: DC

## 2023-08-10 MED ORDER — BUPROPION HCL ER (XL) 150 MG PO TB24
150.0000 mg | ORAL_TABLET | Freq: Every day | ORAL | 0 refills | Status: DC
Start: 2023-08-10 — End: 2023-09-11

## 2023-08-13 ENCOUNTER — Other Ambulatory Visit: Payer: Self-pay | Admitting: Family Medicine

## 2023-08-13 ENCOUNTER — Telehealth: Payer: Self-pay | Admitting: Nurse Practitioner

## 2023-08-13 NOTE — Telephone Encounter (Signed)
Patient called and needs refill on Ibuprofen 800 mg to [facility], she has scheduled her cpe with you.  She was a Burnard Hawthorne patient and wants a female.

## 2023-08-14 MED ORDER — IBUPROFEN 800 MG PO TABS: 800.0000 mg | ORAL_TABLET | Freq: Three times a day (TID) | ORAL | 1 refills | Status: DC | PRN

## 2023-09-11 ENCOUNTER — Encounter: Payer: Self-pay | Admitting: Nurse Practitioner

## 2023-09-11 ENCOUNTER — Ambulatory Visit: Payer: BC Managed Care – PPO | Admitting: Nurse Practitioner

## 2023-09-11 VITALS — BP 124/82 | HR 78 | Ht 64.0 in | Wt 256.6 lb

## 2023-09-11 DIAGNOSIS — E7849 Other hyperlipidemia: Secondary | ICD-10-CM

## 2023-09-11 DIAGNOSIS — Z0000 Encounter for general adult medical examination without abnormal findings: Secondary | ICD-10-CM

## 2023-09-11 DIAGNOSIS — R7303 Prediabetes: Secondary | ICD-10-CM

## 2023-09-11 DIAGNOSIS — K760 Fatty (change of) liver, not elsewhere classified: Secondary | ICD-10-CM | POA: Diagnosis not present

## 2023-09-11 DIAGNOSIS — F419 Anxiety disorder, unspecified: Secondary | ICD-10-CM | POA: Diagnosis not present

## 2023-09-11 DIAGNOSIS — D500 Iron deficiency anemia secondary to blood loss (chronic): Secondary | ICD-10-CM

## 2023-09-11 DIAGNOSIS — R319 Hematuria, unspecified: Secondary | ICD-10-CM | POA: Diagnosis not present

## 2023-09-11 DIAGNOSIS — Z30432 Encounter for removal of intrauterine contraceptive device: Secondary | ICD-10-CM

## 2023-09-11 DIAGNOSIS — Z23 Encounter for immunization: Secondary | ICD-10-CM | POA: Diagnosis not present

## 2023-09-11 LAB — POCT URINALYSIS DIP (CLINITEK)
Bilirubin, UA: NEGATIVE
Glucose, UA: NEGATIVE mg/dL
Ketones, POC UA: NEGATIVE mg/dL
Leukocytes, UA: NEGATIVE
Nitrite, UA: NEGATIVE
POC PROTEIN,UA: NEGATIVE
Spec Grav, UA: 1.025 (ref 1.010–1.025)
Urobilinogen, UA: 0.2 E.U./dL
pH, UA: 6 (ref 5.0–8.0)

## 2023-09-11 MED ORDER — IBUPROFEN 800 MG PO TABS
800.0000 mg | ORAL_TABLET | Freq: Three times a day (TID) | ORAL | 1 refills | Status: DC | PRN
Start: 2023-09-11 — End: 2024-01-08

## 2023-09-11 NOTE — Patient Instructions (Addendum)
For all adult patients, I recommend A well balanced diet low in saturated fats, cholesterol, and moderation in carbohydrates.   This can be as simple as monitoring portion sizes and cutting back on sugary beverages such as soda and juice to start with.    Daily water consumption of at least 64 ounces.  Physical activity at least 180 minutes per week, if just starting out.   This can be as simple as taking the stairs instead of the elevator and walking 2-3 laps around the office  purposefully every day.   STD protection, partner selection, and regular testing if high risk.  Limited consumption of alcoholic beverages if alcohol is consumed.  For women, I recommend no more than 7 alcoholic beverages per week, spread out throughout the week.  Avoid "binge" drinking or consuming large quantities of alcohol in one setting.   Please let me know if you feel you may need help with reduction or quitting alcohol consumption.   Avoidance of nicotine, if used.  Please let me know if you feel you may need help with reduction or quitting nicotine use.   Daily mental health attention.  This can be in the form of 5 minute daily meditation, prayer, journaling, yoga, reflection, etc.   Purposeful attention to your emotions and mental state can significantly improve your overall wellbeing  and  Health.  Please know that I am here to help you with all of your health care goals and am happy to work with you to find a solution that works best for you.  The greatest advice I have received with any changes in life are to take it one step at a time, that even means if all you can focus on is the next 60 seconds, then do that and celebrate your victories.  With any changes in life, you will have set backs, and that is OK. The important thing to remember is, if you have a set back, it is not a failure, it is an opportunity to try again!  Health Maintenance Recommendations Screening Testing Mammogram Every 1 -2  years based on history and risk factors Starting at age 67 Pap Smear Ages 21-39 every 3 years Ages 18-65 every 5 years with HPV testing More frequent testing may be required based on results and history Colon Cancer Screening Every 1-10 years based on test performed, risk factors, and history Starting at age 67 Bone Density Screening Every 2-10 years based on history Starting at age 37 for women Recommendations for men differ based on medication usage, history, and risk factors AAA Screening One time ultrasound Men 4-45 years old who have every smoked Lung Cancer Screening Low Dose Lung CT every 12 months Age 44-80 years with a 30 pack-year smoking history who still smoke or who have quit within the last 15 years  Screening Labs Routine  Labs: Complete Blood Count (CBC), Complete Metabolic Panel (CMP), Cholesterol (Lipid Panel) Every 6-12 months based on history and medications May be recommended more frequently based on current conditions or previous results Hemoglobin A1c Lab Every 3-12 months based on history and previous results Starting at age 72 or earlier with diagnosis of diabetes, high cholesterol, BMI >26, and/or risk factors Frequent monitoring for patients with diabetes to ensure blood sugar control Thyroid Panel (TSH w/ T3 & T4) Every 6 months based on history, symptoms, and risk factors May be repeated more often if on medication HIV One time testing for all patients 69 and older May be repeated  more frequently for patients with increased risk factors or exposure Hepatitis C One time testing for all patients 18 and older May be repeated more frequently for patients with increased risk factors or exposure Gonorrhea, Chlamydia Every 12 months for all sexually active persons 13-24 years Additional monitoring may be recommended for those who are considered high risk or who have symptoms PSA Men 35-43 years old with risk factors Additional screening may be  recommended from age 15-69 based on risk factors, symptoms, and history  Vaccine Recommendations Tetanus Booster All adults every 10 years Flu Vaccine All patients 6 months and older every year COVID Vaccine All patients 12 years and older Initial dosing with booster May recommend additional booster based on age and health history HPV Vaccine 2 doses all patients age 79-26 Dosing may be considered for patients over 26 Shingles Vaccine (Shingrix) 2 doses all adults 55 years and older Pneumonia (Pneumovax 23) All adults 65 years and older May recommend earlier dosing based on health history Pneumonia (Prevnar 68) All adults 65 years and older Dosed 1 year after Pneumovax 23  Additional Screening, Testing, and Vaccinations may be recommended on an individualized basis based on family history, health history, risk factors, and/or exposure.

## 2023-09-12 LAB — CMP14+EGFR
ALT: 21 IU/L (ref 0–32)
AST: 20 IU/L (ref 0–40)
Albumin: 4.4 g/dL (ref 3.8–4.9)
Alkaline Phosphatase: 95 IU/L (ref 44–121)
BUN/Creatinine Ratio: 18 (ref 9–23)
BUN: 13 mg/dL (ref 6–24)
Bilirubin Total: 0.4 mg/dL (ref 0.0–1.2)
CO2: 25 mmol/L (ref 20–29)
Calcium: 9.4 mg/dL (ref 8.7–10.2)
Chloride: 101 mmol/L (ref 96–106)
Creatinine, Ser: 0.73 mg/dL (ref 0.57–1.00)
Globulin, Total: 2.7 g/dL (ref 1.5–4.5)
Glucose: 83 mg/dL (ref 70–99)
Potassium: 4.1 mmol/L (ref 3.5–5.2)
Sodium: 140 mmol/L (ref 134–144)
Total Protein: 7.1 g/dL (ref 6.0–8.5)
eGFR: 96 mL/min/{1.73_m2} (ref >59)

## 2023-09-12 LAB — VITAMIN D 25 HYDROXY (VIT D DEFICIENCY, FRACTURES): Vit D, 25-Hydroxy: 67.9 ng/mL (ref 30.0–100.0)

## 2023-09-12 LAB — TSH: TSH: 2.24 u[IU]/mL (ref 0.450–4.500)

## 2023-09-12 LAB — CBC WITH DIFFERENTIAL/PLATELET
Basophils Absolute: 0 10*3/uL (ref 0.0–0.2)
Basos: 0 %
EOS (ABSOLUTE): 0.2 10*3/uL (ref 0.0–0.4)
Eos: 3 %
Hematocrit: 34.9 % (ref 34.0–46.6)
Hemoglobin: 11.1 g/dL (ref 11.1–15.9)
Immature Grans (Abs): 0 10*3/uL (ref 0.0–0.1)
Immature Granulocytes: 0 %
Lymphocytes Absolute: 3.7 10*3/uL — ABNORMAL HIGH (ref 0.7–3.1)
Lymphs: 46 %
MCH: 28.5 pg (ref 26.6–33.0)
MCHC: 31.8 g/dL (ref 31.5–35.7)
MCV: 90 fL (ref 79–97)
Monocytes Absolute: 0.5 10*3/uL (ref 0.1–0.9)
Monocytes: 6 %
Neutrophils Absolute: 3.6 10*3/uL (ref 1.4–7.0)
Neutrophils: 45 %
Platelets: 145 10*3/uL — ABNORMAL LOW (ref 150–450)
RBC: 3.89 x10E6/uL (ref 3.77–5.28)
RDW: 14.7 % (ref 11.7–15.4)
WBC: 8 10*3/uL (ref 3.4–10.8)

## 2023-09-12 LAB — IRON,TIBC AND FERRITIN PANEL
Ferritin: 45 ng/mL (ref 15–150)
Iron Saturation: 10 % — ABNORMAL LOW (ref 15–55)
Iron: 36 ug/dL (ref 27–159)
Total Iron Binding Capacity: 375 ug/dL (ref 250–450)
UIBC: 339 ug/dL (ref 131–425)

## 2023-09-12 LAB — LIPID PANEL
Chol/HDL Ratio: 3.5 ratio (ref 0.0–4.4)
Cholesterol, Total: 182 mg/dL (ref 100–199)
HDL: 52 mg/dL (ref >39)
LDL Chol Calc (NIH): 117 mg/dL — ABNORMAL HIGH (ref 0–99)
Triglycerides: 70 mg/dL (ref 0–149)
VLDL Cholesterol Cal: 13 mg/dL (ref 5–40)

## 2023-09-12 LAB — HEMOGLOBIN A1C
Est. average glucose Bld gHb Est-mCnc: 128 mg/dL
Hgb A1c MFr Bld: 6.1 % — ABNORMAL HIGH (ref 4.8–5.6)

## 2023-09-14 ENCOUNTER — Ambulatory Visit (INDEPENDENT_AMBULATORY_CARE_PROVIDER_SITE_OTHER): Payer: BC Managed Care – PPO | Admitting: Adult Health

## 2023-09-18 ENCOUNTER — Encounter: Payer: Self-pay | Admitting: Nurse Practitioner

## 2023-09-18 NOTE — Assessment & Plan Note (Signed)
Historic diagnosis. Labs to be repeated today. Managed with diet and exercise at this time.

## 2023-09-18 NOTE — Assessment & Plan Note (Signed)
Working on [employer] with diet, exercise, and help of weight management team. Recommend diet and exercise regimen with avoidance of skipped meals. Will continue to monitor closely.

## 2023-09-18 NOTE — Assessment & Plan Note (Signed)
Chronic. Well controlled with escitalopram. No alarm symptoms present at this time. Will continue to monitor.

## 2023-09-18 NOTE — Assessment & Plan Note (Signed)
History of chronic hematuria of unknown etiology. Will monitor today.Consider urology evaluation if this is present.

## 2023-09-18 NOTE — Assessment & Plan Note (Signed)
Labs pending. CT in 2021 showed dx. No symptoms at this time.

## 2023-09-18 NOTE — Assessment & Plan Note (Signed)
Repeat labs today for evaluation. Reported history of microscopic hematuria in urine with etiology unknown. Will monitor.

## 2023-09-18 NOTE — Assessment & Plan Note (Signed)
Labs pending. Currently managed with rosuvastatin every other day dosing. Tolerating well. No changes at this time.

## 2023-09-18 NOTE — Assessment & Plan Note (Signed)
CPE completed today. Review of HM activities and recommendations discussed and provided on AVS. Anticipatory guidance, diet, and exercise recommendations provided. Medications, allergies, and hx reviewed and updated as necessary. Orders placed as listed below.  Plan: - Labs ordered. Will make changes as necessary based on results.  - I will review these results and send recommendations via MyChart or a telephone call.  -IUD removal today. Monitor closely for pregnancy in event menopause is not complete.  - F/U with CPE in 1 year or sooner for acute/chronic health needs as directed.

## 2023-09-22 ENCOUNTER — Telehealth: Payer: Self-pay | Admitting: Nurse Practitioner

## 2023-09-22 ENCOUNTER — Other Ambulatory Visit: Payer: Self-pay

## 2023-09-22 DIAGNOSIS — J4520 Mild intermittent asthma, uncomplicated: Secondary | ICD-10-CM

## 2023-09-22 MED ORDER — ALBUTEROL SULFATE HFA 108 (90 BASE) MCG/ACT IN AERS: 2.0000 | INHALATION_SPRAY | Freq: Four times a day (QID) | RESPIRATORY_TRACT | 1 refills | Status: DC | PRN

## 2023-09-22 NOTE — Telephone Encounter (Signed)
Pt is asking if Vanessa Copeland was ever able to check into the blood in her urine?  Also she says her inhaler has expired and she is needing a new one?

## 2023-09-24 ENCOUNTER — Other Ambulatory Visit: Payer: Self-pay

## 2023-09-24 DIAGNOSIS — R319 Hematuria, unspecified: Secondary | ICD-10-CM

## 2023-09-24 MED ORDER — ALBUTEROL SULFATE HFA 108 (90 BASE) MCG/ACT IN AERS: 2.0000 | INHALATION_SPRAY | Freq: Four times a day (QID) | RESPIRATORY_TRACT | 6 refills | Status: AC | PRN

## 2023-09-24 NOTE — Telephone Encounter (Signed)
Please let her know that I have not been able to find a cause for the blood in the urine. I do feel that since this is chronic it would be helpful to see urology to make sure that nothing needs to be done.   I have refilled the inhaler for her as well.   If she is ok with the referral, please send for hematuria.

## 2023-09-25 ENCOUNTER — Other Ambulatory Visit: Payer: Self-pay

## 2023-09-25 DIAGNOSIS — D696 Thrombocytopenia, unspecified: Secondary | ICD-10-CM

## 2023-10-12 ENCOUNTER — Ambulatory Visit: Payer: BC Managed Care – PPO | Admitting: Urology

## 2023-10-12 ENCOUNTER — Encounter: Payer: Self-pay | Admitting: Urology

## 2023-10-12 VITALS — BP 146/85 | HR 69 | Ht 63.0 in | Wt 250.0 lb

## 2023-10-12 DIAGNOSIS — R3129 Other microscopic hematuria: Secondary | ICD-10-CM | POA: Diagnosis not present

## 2023-10-12 DIAGNOSIS — Z87442 Personal history of urinary calculi: Secondary | ICD-10-CM | POA: Diagnosis not present

## 2023-10-12 DIAGNOSIS — N200 Calculus of kidney: Secondary | ICD-10-CM

## 2023-10-12 NOTE — Progress Notes (Signed)
Assessment: 1. Microscopic hematuria   2. Nephrolithiasis     Plan: I personally reviewed the patient's chart including provider notes, lab and imaging results. Today I had a discussion with the patient regarding the findings of microscopic hematuria including the implications and differential diagnoses associated with it.  I also discussed recommendations for further evaluation including the rationale for upper tract imaging and cystoscopy.  I discussed the nature of these procedures including potential risk and complications.  The patient expressed an understanding of these issues. Schedule for CT hematuria protocol. CX bladder sent today.   Chief Complaint:  Chief Complaint  Patient presents with   Hematuria    History of Present Illness:  Vanessa Copeland is a 57 y.o. female who is seen in consultation from Early, Sung Amabile, NP for evaluation of hematuria. She has had blood on dipstick UA's for several years.  No recent microscopic urinalysis performed.  She had an episode of gross hematuria which was associated with a kidney stone.  No recent episodes of gross hematuria.  No recent flank pain.  She underwent left ureteroscopy for a renal stone in February 2023.  Her last stone episode was in December 2023.  CT imaging at that time showed bilateral nephrolithiasis and a 3 mm calculus at the right distal ureter with associated obstruction.  She reports passing that stone with resolution of her symptoms. She currently reports frequency, nocturia, and intermittent stream.  No recent UTIs. No history of tobacco use.   Past Medical History:  Past Medical History:  Diagnosis Date   Allergy    Anemia    Anxiety    Asthma    mild intermittent(when allergies are flaring)   Complication of anesthesia    Food allergy    Shellfish   H/O echocardiogram 12/2005   Trace MR, mild TR, nl L ventricular systolic function.   Heart murmur    since birth. 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   Vitamin D deficiency 01/2010    Past Surgical History:  Past Surgical History:  Procedure Laterality Date   CESAREAN SECTION     x3   COLONOSCOPY     CYSTOSCOPY WITH RETROGRADE PYELOGRAM, URETEROSCOPY AND STENT PLACEMENT Left 07/16/2018   Procedure: CYSTOSCOPY WITH RETROGRADE PYELOGRAM, URETEROSCOPY AND STENT PLACEMENT;  Surgeon: Malen Gauze, MD;  Location: [facility];  Service: Urology;  Laterality: Left;   EXTRACORPOREAL SHOCK WAVE LITHOTRIPSY Left 06/21/2018   Procedure: LEFT EXTRACORPOREAL SHOCK WAVE LITHOTRIPSY (ESWL);  Surgeon: Marcine Matar, MD;  Location: [facility];  Service: Urology;  Laterality: Left;   HOLMIUM LASER APPLICATION Left 07/16/2018   Procedure: HOLMIUM LASER APPLICATION;  Surgeon: Malen Gauze, MD;  Location: [facility];  Service: Urology;  Laterality: Left;   LUMBAR DISC SURGERY  2019   OOPHORECTOMY  2002   left due to cyst   POLYPECTOMY     TUBAL LIGATION  1996    Allergies:  Allergies  Allergen Reactions   Shellfish Allergy Shortness Of Breath    Family History:  Family History  Problem Relation Age of Onset   Hypertension Mother    Anemia Mother    Colon polyps Mother  Anxiety disorder Mother    Cancer Father        lung   Kidney Stones Father    Hypertension Father    Lung cancer Father 73   Asthma Brother    Asthma Daughter    Allergies Daughter    Asthma Daughter    Hypertension Brother    Breast cancer Neg Hx    Colon cancer Neg Hx    Diabetes Neg Hx    Heart disease Neg Hx    Esophageal cancer Neg Hx    Rectal cancer Neg Hx    Stomach cancer Neg Hx     Social History:  Social History   Tobacco Use   Smoking status: Never   Smokeless tobacco: Never   Vaping Use   Vaping status: Never Used  Substance Use Topics   Alcohol use: No   Drug use: No    Review of symptoms:  Constitutional:  Negative for unexplained weight loss, night sweats, fever, chills ENT:  Negative for nose bleeds, sinus pain, painful swallowing CV:  Negative for chest pain, shortness of breath, exercise intolerance, palpitations, loss of consciousness Resp:  Negative for cough, wheezing, shortness of breath GI:  Negative for nausea, vomiting, diarrhea, bloody stools GU:  Positives noted in HPI; otherwise negative for gross hematuria, dysuria, urinary incontinence Neuro:  Negative for seizures, poor balance, limb weakness, slurred speech Psych:  Negative for lack of energy, depression, anxiety Endocrine:  Negative for polydipsia, polyuria, symptoms of hypoglycemia (dizziness, hunger, sweating) Hematologic:  Negative for anemia, purpura, petechia, prolonged or excessive bleeding, use of anticoagulants  Allergic:  Negative for difficulty breathing or choking as a result of exposure to anything; no shellfish allergy; no allergic response (rash/itch) to materials, foods  Physical exam: BP (!) 146/85   Pulse 69   Ht 5\' 3"  (1.6 m)   Wt 250 lb (113.4 kg)   BMI 44.29 kg/m  GENERAL APPEARANCE:  Well appearing, well developed, well nourished, NAD HEENT: Atraumatic, Normocephalic, oropharynx clear. NECK: Supple without lymphadenopathy or thyromegaly. LUNGS: Clear to auscultation bilaterally. HEART: Regular Rate and Rhythm without murmurs, gallops, or rubs. ABDOMEN: Soft, non-tender, No Masses. EXTREMITIES: Moves all extremities well.  Without clubbing, cyanosis, or edema. NEUROLOGIC:  Alert and oriented x 3, normal gait, CN II-XII grossly intact.  MENTAL STATUS:  Appropriate. BACK:  Non-tender to palpation.  No CVAT SKIN:  Warm, dry and intact.    Results: U/A:  3-10 RBC, moderate bacteria

## 2023-10-15 ENCOUNTER — Encounter: Payer: Self-pay | Admitting: Urology

## 2023-10-15 LAB — MICROSCOPIC EXAMINATION

## 2023-10-15 LAB — URINALYSIS, ROUTINE W REFLEX MICROSCOPIC
Bilirubin, UA: NEGATIVE
Glucose, UA: NEGATIVE
Ketones, UA: NEGATIVE
Leukocytes,UA: NEGATIVE
Nitrite, UA: NEGATIVE
Protein,UA: NEGATIVE
Specific Gravity, UA: >1.03 — ABNORMAL HIGH (ref 1.005–1.030)
Urobilinogen, Ur: 0.2 mg/dL (ref 0.2–1.0)
pH, UA: 6 (ref 5.0–7.5)

## 2023-10-26 ENCOUNTER — Ambulatory Visit (HOSPITAL_BASED_OUTPATIENT_CLINIC_OR_DEPARTMENT_OTHER): Admission: RE | Admit: 2023-10-26 | Payer: BC Managed Care – PPO | Source: Ambulatory Visit

## 2023-10-27 ENCOUNTER — Encounter: Payer: Self-pay | Admitting: Urology

## 2023-10-27 ENCOUNTER — Telehealth: Payer: Self-pay | Admitting: Urology

## 2023-10-27 NOTE — Telephone Encounter (Signed)
Returning your call regarding CX bladder Results

## 2023-11-02 LAB — HM MAMMOGRAPHY

## 2023-11-04 ENCOUNTER — Encounter: Payer: Self-pay | Admitting: Urology

## 2023-11-04 ENCOUNTER — Other Ambulatory Visit: Payer: Self-pay | Admitting: Urology

## 2023-11-04 DIAGNOSIS — R3129 Other microscopic hematuria: Secondary | ICD-10-CM

## 2023-11-07 ENCOUNTER — Ambulatory Visit (HOSPITAL_BASED_OUTPATIENT_CLINIC_OR_DEPARTMENT_OTHER)
Admission: RE | Admit: 2023-11-07 | Discharge: 2023-11-07 | Disposition: A | Discharge status: Home / Self Care | Payer: BC Managed Care – PPO | Source: Ambulatory Visit | Attending: Urology | Admitting: Urology

## 2023-11-07 DIAGNOSIS — R3129 Other microscopic hematuria: Secondary | ICD-10-CM | POA: Insufficient documentation

## 2023-11-09 ENCOUNTER — Encounter: Payer: Self-pay | Admitting: Nurse Practitioner

## 2023-11-12 ENCOUNTER — Encounter: Payer: Self-pay | Admitting: Urology

## 2023-11-13 ENCOUNTER — Other Ambulatory Visit: Payer: Self-pay | Admitting: Nurse Practitioner

## 2023-11-13 DIAGNOSIS — J4520 Mild intermittent asthma, uncomplicated: Secondary | ICD-10-CM

## 2023-11-25 ENCOUNTER — Encounter (INDEPENDENT_AMBULATORY_CARE_PROVIDER_SITE_OTHER): Payer: Self-pay | Admitting: Internal Medicine

## 2023-11-25 ENCOUNTER — Ambulatory Visit (INDEPENDENT_AMBULATORY_CARE_PROVIDER_SITE_OTHER): Payer: BC Managed Care – PPO | Admitting: Internal Medicine

## 2023-11-25 VITALS — BP 145/79 | HR 69 | Temp 97.8°F | Ht 63.0 in | Wt 257.0 lb

## 2023-11-25 DIAGNOSIS — E559 Vitamin D deficiency, unspecified: Secondary | ICD-10-CM

## 2023-11-25 DIAGNOSIS — E66813 Obesity, class 3: Secondary | ICD-10-CM | POA: Insufficient documentation

## 2023-11-25 DIAGNOSIS — F419 Anxiety disorder, unspecified: Secondary | ICD-10-CM

## 2023-11-25 DIAGNOSIS — E669 Obesity, unspecified: Secondary | ICD-10-CM

## 2023-11-25 DIAGNOSIS — E7849 Other hyperlipidemia: Secondary | ICD-10-CM

## 2023-11-25 DIAGNOSIS — R7303 Prediabetes: Secondary | ICD-10-CM | POA: Diagnosis not present

## 2023-11-25 DIAGNOSIS — Z6841 Body Mass Index (BMI) 40.0 and over, adult: Secondary | ICD-10-CM

## 2023-11-25 MED ORDER — METFORMIN HCL ER 500 MG PO TB24: 500.0000 mg | ORAL_TABLET | Freq: Every day | ORAL | 0 refills | Status: DC

## 2023-11-25 MED ORDER — ESCITALOPRAM OXALATE 10 MG PO TABS: 10.0000 mg | ORAL_TABLET | Freq: Every day | ORAL | 0 refills | Status: DC

## 2023-11-25 NOTE — Progress Notes (Signed)
Office: [phone]  /  Fax: [phone]  Weight Summary And Biometrics  Vitals Temp: 97.8 F (36.6 C) BP: (!) 145/79 Pulse Rate: 69 SpO2: 100 %   Anthropometric Measurements Height: 5\' 3"  (1.6 m) Weight: 257 lb (116.6 kg) BMI (Calculated): 45.54 Weight at Last Visit: 228 lb Starting Weight: 256 lb   Body Composition  Body Fat %: 52.1 % Fat Mass (lbs): 133.8 lbs Muscle Mass (lbs): 117 lbs Total Body Water (lbs): 92.2 lbs Visceral Fat Rating : 18    No data recorded Today's Visit #: 52  No data recorded  Subjective   Chief Complaint: Obesity  Vanessa Copeland is here to discuss her progress with her obesity treatment plan. She is on the the Category 4 Plan and states she is following her eating plan approximately 0 % of the time. She states she is not exercising.  Interval History:   Discussed the use of AI scribe software for clinical note transcription with the patient, who gave verbal consent to proceed.  History of Present Illness   This is my first encounter with Vanessa Copeland.  She is a 57 year old with a history of prediabetes and obesity presents with ongoing struggles with weight gain. She reports a history of using various weight loss medications, including Wegovy, Saxenda, and Mounjaro, with the most success noted with [facility]. However, due to insurance coverage issues, the patient had to discontinue Wegovy. She also reports a history of using metformin, which was poorly tolerated due to gastrointestinal side effects, including loose stools.  The patient has been attempting to manage her weight through diet, with a recent enrollment in Weight Watchers and use of the My Fitness Pal app. She expresses a desire to better understand her nutritional needs and improve her eating habits. She also reports a history of taking high-dose vitamin D and Crestor, but expresses concerns about potential side effects.  The patient has a history of anxiety, for which she is currently  taking Lexapro. She reports that this medication helps with stress eating. She also reports occasional use of ibuprofen for jaw pain.  The patient has a known shellfish allergy and a history of kidney stones, which may limit some medication options. She also reports a history of elevated blood pressure, which she is monitoring.  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 She expresses a desire for continuity in her care and a comprehensive approach to managing her weight and prediabetes.     Orexigenic Control:  Reports problems with appetite and hunger signals.  Reports problems with satiety and satiation.  Reports problems with eating patterns and portion control.  Denies abnormal cravings. Denies feeling deprived or restricted.   Barriers identified: cost of medication, strong hunger signals and impaired satiety / inhibitory control, and low volume of physical activity at present .   Pharmacotherapy for weight loss: She is currently taking no anti-obesity medication.   Assessment and Plan   Treatment Plan For Obesity:  Recommended Dietary Goals  Vanessa Copeland is currently in the action stage of change. As such, her goal is to continue weight management plan. She has agreed to:  Track and journal 1300 cal and about 100 g of protein per day.  We discussed using the balanced plate and reducing simple and added sugars in her diet as she has prediabetes.  Patient had been targeting 1800 cal when her calculated BMR is around [employer]  We discussed the following Behavioral Modification Strategies today: continue to work on maintaining a reduced calorie state, getting the recommended amount of protein, incorporating whole foods, making healthy choices, staying well hydrated and practicing mindfulness when eating..  Additional resources provided today: None  Recommended Physical Activity  Goals  Vanessa Copeland has been advised to work up to 150 minutes of moderate intensity aerobic activity a week and strengthening exercises 2-3 times per week for cardiovascular health, weight loss maintenance and preservation of muscle mass.   She has agreed to :  Think about enjoyable ways to increase daily physical activity and overcoming barriers to exercise and Increase physical activity in their day and reduce sedentary time (increase NEAT).  Pharmacotherapy  We discussed various medication options to help Vanessa Copeland with her weight loss efforts and we both agreed to :  We will start metformin XR 500 mg 1 tablet daily for diabetes prevention and incretin effect.  Associated Conditions Addressed Today  Prediabetes -     metFORMIN HCl ER; Take 1 tablet (500 mg total) by mouth daily with breakfast.  Dispense: 30 tablet; Refill: 0  Vitamin D deficiency  Other hyperlipidemia  Anxiety -     Escitalopram Oxalate; Take 1 tablet (10 mg total) by mouth daily.  Dispense: 90 tablet; Refill: 0    Assessment and Plan    Obesity   This is my first encounter with Vanessa Copeland, she was scheduled for focused appointment and has not been to the clinic in a few months.  She has been seen by multiple providers and has had wide fluctuations with her weight loss progress due to reliance on GLP-1 therapy and frequent treatment interruptions due to insurance issues.  I also reviewed previous IC, which appears to be overestimating her basal metabolic rate when compared to calculated her basal metabolic rate is 1610 she has been targeting 1800 cal via tracking and journaling.  She will be scheduled for an extended visit to review some nutritional principles, for now she will start to track and journal shooting for 1300 cal and about 100 g of protein per day.  She will also work on eating more balanced and reducing simple and added sugars in her diet.  We discussed the energy balance model and carb insulin model today.  In  addition she will be started on metformin XR 500 mg 1 tablet daily she had side effects to the immediate release in the past.  Insulin Resistance/Prediabetes   Her A1c of 6.1 indicates prediabetes. We discussed the role of carbohydrates in weight gain and insulin resistance, recommending a low-carb diet to manage insulin levels and support weight loss. We explained that reducing simple carbohydrates and increasing physical activity can help manage insulin resistance and promote weight loss. We will start metformin XR with breakfast to improve insulin sensitivity and reduce cravings for sweets, and monitor blood glucose levels and A1c periodically.  Hypertension   Elevated blood pressure was noted during the visit. We discussed the potential impact of weight and diet on blood pressure and emphasized the importance of monitoring blood pressure and adjusting diet to reduce sodium intake and increase intake of fruits and vegetables, noting that phentermine is not recommended due to its stimulating effects.  We will follow-up on her blood pressure and initiate antihypertensives if self-monitoring reveals an elevated trend.  Kidney Stones   Her history of kidney stones precludes the use of certain medications like Topamax and zonisamide so we will avoid medications contraindicated in patients with a history  of kidney stones.  General Health Maintenance   We discussed the need for regular health maintenance including blood work and vitamin supplementation. We explained that high-dose vitamin D is used to replenish levels but can cause toxicity if taken long-term. We discussed the low cardiovascular risk and coronary artery calcium score of 0 making Crestor unnecessary.  This may also affect her blood sugars.  We will stop high-dose vitamin D and switch to D3 2000 units daily, stop Crestor due to low cardiovascular risk and absence of plaque buildup, and schedule follow-up blood work in three  months.  Follow-up   We will schedule a follow-up appointment in January for a comprehensive review and potential adjustment of metformin dosage and evaluate insurance coverage for weight loss medications in January.        Objective   Physical Exam:  Blood pressure (!) 145/79, pulse 69, temperature 97.8 F (36.6 C), height 5\' 3"  (1.6 m), weight 257 lb (116.6 kg), SpO2 100%. Body mass index is 45.53 kg/m.  General: She is overweight, cooperative, alert, well developed, and in no acute distress. PSYCH: Has normal mood, affect and thought process.   HEENT: EOMI, sclerae are anicteric. Lungs: Normal breathing effort, no conversational dyspnea. Extremities: No edema.  Neurologic: No gross sensory or motor deficits. No tremors or fasciculations noted.    Diagnostic Data Reviewed:  BMET    Component Value Date/Time   NA 140 09/11/2023 1609   K 4.1 09/11/2023 1609   CL 101 09/11/2023 1609   CO2 25 09/11/2023 1609   GLUCOSE 83 09/11/2023 1609   GLUCOSE 123 (H) 12/02/2022 0708   BUN 13 09/11/2023 1609   CREATININE 0.73 09/11/2023 1609   CREATININE 0.70 10/21/2012 1145   CALCIUM 9.4 09/11/2023 1609   GFRNONAA >60 12/02/2022 0708   GFRAA 96 11/27/2020 0929   Lab Results  Component Value Date   HGBA1C 6.1 (H) 09/11/2023   HGBA1C 5.7 (H) 05/04/2019   Lab Results  Component Value Date   INSULIN 9.4 07/08/2023   INSULIN 14.6 09/06/2019   Lab Results  Component Value Date   TSH 2.240 09/11/2023   CBC    Component Value Date/Time   WBC 8.0 09/11/2023 1609   WBC 9.3 12/02/2022 0708   RBC 3.89 09/11/2023 1609   RBC 3.85 (L) 12/02/2022 0708   HGB 11.1 09/11/2023 1609   HCT 34.9 09/11/2023 1609   PLT 145 (L) 09/11/2023 1609   MCV 90 09/11/2023 1609   MCH 28.5 09/11/2023 1609   MCH 29.4 12/02/2022 0708   MCHC 31.8 09/11/2023 1609   MCHC 32.6 12/02/2022 0708   RDW 14.7 09/11/2023 1609   Iron Studies    Component Value Date/Time   IRON 36 09/11/2023 1609   TIBC  375 09/11/2023 1609   FERRITIN 45 09/11/2023 1609   IRONPCTSAT 10 (L) 09/11/2023 1609   Lipid Panel     Component Value Date/Time   CHOL 182 09/11/2023 1609   TRIG 70 09/11/2023 1609   HDL 52 09/11/2023 1609   CHOLHDL 3.5 09/11/2023 1609   LDLCALC 117 (H) 09/11/2023 1609   Hepatic Function Panel     Component Value Date/Time   PROT 7.1 09/11/2023 1609   ALBUMIN 4.4 09/11/2023 1609   AST 20 09/11/2023 1609   ALT 21 09/11/2023 1609   ALKPHOS 95 09/11/2023 1609   BILITOT 0.4 09/11/2023 1609      Component Value Date/Time   TSH 2.240 09/11/2023 1609   Nutritional Lab Results  Component  Value Date   VD25OH 67.9 09/11/2023   VD25OH 57.1 07/08/2023   VD25OH 50.9 11/17/2022    Follow-Up   Return in about 4 weeks (around 12/23/2023) for For Weight Mangement with Dr. Rikki Spearing - 40 minutes new patient follow-up.Marland Kitchen She was informed of the importance of frequent follow up visits to maximize her success with intensive lifestyle modifications for her multiple health conditions.  Attestation Statement   Reviewed by clinician on day of visit: allergies, medications, problem list, medical history, surgical history, family history, social history, and previous encounter notes.     Worthy Rancher, MD

## 2023-11-30 ENCOUNTER — Other Ambulatory Visit: Payer: Self-pay | Admitting: *Deleted

## 2023-11-30 DIAGNOSIS — D500 Iron deficiency anemia secondary to blood loss (chronic): Secondary | ICD-10-CM

## 2023-12-01 ENCOUNTER — Other Ambulatory Visit: Payer: Self-pay | Admitting: Nurse Practitioner

## 2023-12-01 DIAGNOSIS — D696 Thrombocytopenia, unspecified: Secondary | ICD-10-CM

## 2023-12-03 ENCOUNTER — Other Ambulatory Visit: Payer: Self-pay | Admitting: Nurse Practitioner

## 2023-12-03 ENCOUNTER — Encounter (INDEPENDENT_AMBULATORY_CARE_PROVIDER_SITE_OTHER): Payer: Self-pay | Admitting: Internal Medicine

## 2023-12-03 ENCOUNTER — Telehealth (INDEPENDENT_AMBULATORY_CARE_PROVIDER_SITE_OTHER): Payer: Self-pay

## 2023-12-03 DIAGNOSIS — D696 Thrombocytopenia, unspecified: Secondary | ICD-10-CM

## 2023-12-03 NOTE — Telephone Encounter (Signed)
Pt advised to d/c metformin if having side effects and to keep her upcoming appt to discuss bp issues , phentermine, harmful effects. Agreement, etc..Marland Kitchen

## 2023-12-03 NOTE — Progress Notes (Unsigned)
New Hematology/Oncology Consult   Requesting MD: Enid Skeens, NP  [phone]      Reason for Consult: Thrombocytopenia  HPI: [name] is a 57 year old woman referred for evaluation of thrombocytopenia.  She had a CBC completed 07/08/2023.  Platelet count was mildly low at 149,000, hemoglobin 11.6, MCV 89, white count 6.7.  Next CBC on 09/11/2023 returned with a platelet count of 145,000, hemoglobin 11.1, MCV 90, white count 8.0.  Multiple prior CBCs in the EMR reviewed: 12/14/2023 platelet count 297,000; 05/20/2022 platelet count 150,000; 12/27/2021 platelet count 391,000; 03/29/2020 platelet count 169,000; 03/28/2020 platelet count 82,000 (aggregation noted); 01/18/2020 platelet count 319,000; 12/27/2019 platelet count 187,000; 06/30/2019 platelet count 406,000; 05/04/2019 platelet count 140,000; 07/16/2018 platelet count 438,000; 05/20/2018 platelet count 180,000; 08/13/2017 platelet count 369,000; 10/21/2012 platelet count 259,000.  She reports a history of microscopic hematuria due to kidney stones.  No other bleeding.  She indicates "lifelong anemia".  She has a history of a heavy menstrual cycle.  She has not had a menstrual cycle for 10 years.  No fevers or sweats.  She has a good appetite.  She is up-to-date on screening colonoscopy.  No change in bowel habits.  No recent hematuria.  No numbness or tingling in the hands or feet.  No rashes.  No inflamed joints.     Past Medical History:  Diagnosis Date   Allergy    Anemia    Anxiety    Asthma    mild intermittent(when allergies are flaring)   Complication of anesthesia    Food allergy    Shellfish   H/O echocardiogram 12/2005   Trace MR, mild TR, nl L ventricular systolic function.   Heart murmur    since birth. 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 phobia 02/27/2020   Vitamin D deficiency 01/2010     Past Surgical History:  Procedure Laterality Date   CESAREAN SECTION     x3   COLONOSCOPY     CYSTOSCOPY WITH RETROGRADE PYELOGRAM, URETEROSCOPY AND STENT PLACEMENT Left 07/16/2018   Procedure: CYSTOSCOPY WITH RETROGRADE PYELOGRAM, URETEROSCOPY AND STENT PLACEMENT;  Surgeon: Malen Gauze, MD;  Location: [facility];  Service: Urology;  Laterality: Left;   EXTRACORPOREAL SHOCK WAVE LITHOTRIPSY Left 06/21/2018   Procedure: LEFT EXTRACORPOREAL SHOCK WAVE LITHOTRIPSY (ESWL);  Surgeon: Marcine Matar, MD;  Location: [facility];  Service: Urology;  Laterality: Left;   HOLMIUM LASER APPLICATION Left 07/16/2018   Procedure: HOLMIUM LASER APPLICATION;  Surgeon: Malen Gauze, MD;  Location: [facility];  Service: Urology;  Laterality: Left;   LUMBAR DISC SURGERY  2019   OOPHORECTOMY  2002   left due to cyst   POLYPECTOMY     TUBAL LIGATION  1996     Current Outpatient Medications:    albuterol (VENTOLIN HFA) 108 (90 Base) MCG/ACT inhaler, Inhale 2 puffs into the lungs every 6 (six) hours as needed for wheezing or shortness of breath., Disp: 18 g, Rfl: 6   EPINEPHrine 0.3 mg/0.3 mL IJ SOAJ injection, Inject 0.3 mg into the muscle as needed for anaphylaxis., Disp: 1 each, Rfl: 0   escitalopram (LEXAPRO) 10 MG tablet, Take 1 tablet (10 mg total) by mouth daily., Disp: 90 tablet,  Rfl: 0   fluticasone (FLONASE) 50 MCG/ACT nasal spray, SPRAY 2 SPRAYS INTO EACH NOSTRIL EVERY DAY, Disp: 48 mL, Rfl: 1   ibuprofen (ADVIL) 800 MG tablet, Take 1 tablet (800 mg total) by mouth every 8 (eight) hours as needed for jaw pain, Disp: 30 tablet, Rfl: 1   metFORMIN (GLUCOPHAGE-XR) 500 MG 24 hr tablet, Take 1 tablet (500 mg total) by mouth daily with breakfast., Disp: 30 tablet, Rfl: 0   rosuvastatin (CRESTOR) 10 MG tablet, Take 1 tablet (10 mg total) by mouth daily. (Patient not taking: Reported on  12/04/2023), Disp: 90 tablet, Rfl: 0   Vitamin D, Ergocalciferol, (DRISDOL) 1.25 MG (50000 UNIT) CAPS capsule, Take 1 capsule (50,000 Units total) by mouth every 7 (seven) days. (Patient not taking: Reported on 12/04/2023), Disp: 12 capsule, Rfl: 0:    Allergies  Allergen Reactions   Shellfish Allergy Shortness Of Breath    FH: Mother with history of anemia.  No family history of an inherited anemia.  Father deceased with lung cancer.  SOCIAL HISTORY: She lives in [place].  She is married.  She has 3 healthy children.  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  No tobacco or alcohol use.  Review of Systems: She feels "tired".  She reports a history of microscopic hematuria due to kidney stones.  No other bleeding.  She indicates "lifelong anemia".  She has a history of a heavy menstrual cycle.  She has not had a menstrual cycle for 10 years.  No fevers or sweats.  She has a good appetite.  She is up-to-date on screening colonoscopy.  No nausea/vomiting.  No dysphagia.  No change in bowel habits.  She reports baseline bowel habits consist of a bowel movement soon after eating.  This does not occur after every meal.  No bloody or black stools.  No recent hematuria.  No numbness or tingling in the hands or feet.  No rashes.  No inflamed joints.  No unusual headaches.  No vision change.  No shortness of breath.  She eats a regular diet.  She does not crave ice.  No tongue soreness.  No brittle nails.  Physical Exam:  Blood pressure (!) 140/80, pulse 81, temperature 97.9 F (36.6 C), temperature source Temporal, resp. rate 18, height 5\' 3"  (1.6 m), weight 257 lb (116.6 kg), SpO2 99%.  HEENT: No thrush or ulcers. Lungs: Lungs clear bilaterally. Cardiac: Regular rate and rhythm. Vascular: No leg edema. Lymph nodes: No palpable cervical, supraclavicular, axillary or inguinal lymph nodes. Neurologic: Alert and oriented. Skin: No rash.  LABS:   Recent Labs    12/04/23 1058  WBC  8.9  HGB 11.2*  HCT 34.7*  PLT 382  Peripheral blood smear-platelets appear normal in number, small platelet clumps; white cell morphology unremarkable; few ovalocytes, rare teardrop, moderate variation in red cell size, polychromasia not increased.  No results for input(s): "NA", "K", "CL", "CO2", "GLUCOSE", "BUN", "CREATININE", "CALCIUM" in the last 72 hours.    RADIOLOGY:  US RENAL Result Date: 11/12/2023 CLINICAL DATA:  Microscopic hematuria, history of renal stones EXAM: RENAL / URINARY TRACT ULTRASOUND COMPLETE COMPARISON:  CT abdomen/pelvis dated 12/02/2022 FINDINGS: Right Kidney: Renal measurements: 12.0 x 4.5 x 6.4 cm = volume: 179 mL. Echogenicity within normal limits. 3 mm upper pole calculus. No hydronephrosis. Left Kidney: Renal measurements: 12.2 x 5.4 x 5.1 cm = volume: 174 mL. Echogenicity within normal limits. 10 mm upper pole simple cyst, benign. 6 mm upper pole calculus.  No hydronephrosis. Bladder: Appears normal for degree of bladder distention. Other: None. IMPRESSION: Bilateral nonobstructing renal calculi, as above. No hydronephrosis. Electronically Signed   By: Charline Bills M.D.   On: 11/12/2023 09:52    Assessment and Plan:   Thrombocytopenia Mild anemia Microscopic hematuria  History of kidney stones Asthma Postprandial bowel movements  Ms. Vanvickle intermittently has mild thrombocytopenia.  This is noted over many years.  The platelet count is normal today.  The intermittent thrombocytopenia could be due to platelet aggregation.  She also has a mild anemia.  There are some changes in the red cells on the peripheral blood smear that could indicate iron deficiency.  We are checking iron studies.  She intermittently has GI symptoms after eating, specifically urgency to have a bowel movement.  We recommend she follow-up with Enid Skeens, NP.  She may need additional GI evaluation.  We recommend she continue follow-up with Dr. Pete Glatter regarding persistent  microscopic hematuria.  She will return for a CBC and follow-up visit in 6 months.  We are available to see her sooner if needed.  Patient seen with Dr. Truett Perna.  Lonna Cobb, NP 12/04/2023, 12:03 PM  This was a shared visit with Lonna Cobb.  Ms. Matousek was interviewed and examined.  I reviewed the peripheral blood smear.  She is referred for evaluation of mild thrombocytopenia.  Platelet count is normal today.  The mild thrombocytopenia may have been related to platelet clumping, as this was noted in the past.  She has chronic mild anemia.  The iron saturation has been low in the past with low normal ferritin levels.  We will check repeat iron studies today.  She could have iron deficiency related to hematuria or another etiology.  She reports postprandial abdominal symptoms.  She will need additional GI evaluation continued evaluation by urology if she is found to have iron deficiency.   I was present for greater than 50% of today's visit.  I performed medical decision making.  Mancel Bale, MD

## 2023-12-04 ENCOUNTER — Inpatient Hospital Stay: Payer: BC Managed Care – PPO

## 2023-12-04 ENCOUNTER — Inpatient Hospital Stay: Payer: BC Managed Care – PPO | Attending: Nurse Practitioner | Admitting: Nurse Practitioner

## 2023-12-04 ENCOUNTER — Encounter: Payer: Self-pay | Admitting: Nurse Practitioner

## 2023-12-04 VITALS — BP 140/80 | HR 81 | Temp 97.9°F | Resp 18 | Ht 63.0 in | Wt 257.0 lb

## 2023-12-04 DIAGNOSIS — D500 Iron deficiency anemia secondary to blood loss (chronic): Secondary | ICD-10-CM

## 2023-12-04 DIAGNOSIS — D696 Thrombocytopenia, unspecified: Secondary | ICD-10-CM | POA: Diagnosis not present

## 2023-12-04 DIAGNOSIS — D649 Anemia, unspecified: Secondary | ICD-10-CM | POA: Insufficient documentation

## 2023-12-04 DIAGNOSIS — Z87442 Personal history of urinary calculi: Secondary | ICD-10-CM | POA: Insufficient documentation

## 2023-12-04 DIAGNOSIS — R319 Hematuria, unspecified: Secondary | ICD-10-CM | POA: Diagnosis not present

## 2023-12-04 LAB — CBC WITH DIFFERENTIAL (CANCER CENTER ONLY)
Abs Immature Granulocytes: 0.02 10*3/uL (ref 0.00–0.07)
Basophils Absolute: 0 10*3/uL (ref 0.0–0.1)
Basophils Relative: 0 %
Eosinophils Absolute: 0.2 10*3/uL (ref 0.0–0.5)
Eosinophils Relative: 2 %
HCT: 34.7 % — ABNORMAL LOW (ref 36.0–46.0)
Hemoglobin: 11.2 g/dL — ABNORMAL LOW (ref 12.0–15.0)
Immature Granulocytes: 0 %
Lymphocytes Relative: 41 %
Lymphs Abs: 3.6 10*3/uL (ref 0.7–4.0)
MCH: 28.1 pg (ref 26.0–34.0)
MCHC: 32.3 g/dL (ref 30.0–36.0)
MCV: 87.2 fL (ref 80.0–100.0)
Monocytes Absolute: 0.7 10*3/uL (ref 0.1–1.0)
Monocytes Relative: 8 %
Neutro Abs: 4.4 10*3/uL (ref 1.7–7.7)
Neutrophils Relative %: 49 %
Platelet Count: 382 10*3/uL (ref 150–400)
RBC: 3.98 MIL/uL (ref 3.87–5.11)
RDW: 15.6 % — ABNORMAL HIGH (ref 11.5–15.5)
WBC Count: 8.9 10*3/uL (ref 4.0–10.5)
nRBC: 0 % (ref 0.0–0.2)

## 2023-12-04 LAB — FERRITIN: Ferritin: 40 ng/mL (ref 11–307)

## 2023-12-04 LAB — IRON AND TIBC
Iron: 41 ug/dL (ref 28–170)
Saturation Ratios: 10 % — ABNORMAL LOW (ref 10.4–31.8)
TIBC: 416 ug/dL (ref 250–450)
UIBC: 375 ug/dL

## 2023-12-04 LAB — WBC/PLT IN CITRATE

## 2023-12-04 LAB — SAVE SMEAR(SSMR), FOR PROVIDER SLIDE REVIEW

## 2023-12-06 LAB — SOLUBLE TRANSFERRIN RECEPTOR: Transferrin Receptor: 21 nmol/L (ref 12.2–27.3)

## 2023-12-08 NOTE — Telephone Encounter (Signed)
error 

## 2023-12-17 ENCOUNTER — Other Ambulatory Visit (INDEPENDENT_AMBULATORY_CARE_PROVIDER_SITE_OTHER): Payer: Self-pay | Admitting: Internal Medicine

## 2023-12-17 DIAGNOSIS — R7303 Prediabetes: Secondary | ICD-10-CM

## 2023-12-18 ENCOUNTER — Other Ambulatory Visit: Payer: Self-pay | Admitting: Nurse Practitioner

## 2023-12-18 DIAGNOSIS — D649 Anemia, unspecified: Secondary | ICD-10-CM

## 2023-12-18 DIAGNOSIS — D696 Thrombocytopenia, unspecified: Secondary | ICD-10-CM

## 2023-12-24 ENCOUNTER — Encounter: Payer: Self-pay | Admitting: Urology

## 2023-12-31 ENCOUNTER — Ambulatory Visit (INDEPENDENT_AMBULATORY_CARE_PROVIDER_SITE_OTHER): Payer: Self-pay | Admitting: Internal Medicine

## 2024-01-07 ENCOUNTER — Other Ambulatory Visit: Payer: Self-pay | Admitting: Nurse Practitioner

## 2024-01-07 DIAGNOSIS — K760 Fatty (change of) liver, not elsewhere classified: Secondary | ICD-10-CM

## 2024-01-07 DIAGNOSIS — G8929 Other chronic pain: Secondary | ICD-10-CM

## 2024-01-07 DIAGNOSIS — R319 Hematuria, unspecified: Secondary | ICD-10-CM

## 2024-01-07 DIAGNOSIS — R7303 Prediabetes: Secondary | ICD-10-CM

## 2024-01-07 DIAGNOSIS — Z23 Encounter for immunization: Secondary | ICD-10-CM

## 2024-01-07 DIAGNOSIS — E7849 Other hyperlipidemia: Secondary | ICD-10-CM

## 2024-01-07 DIAGNOSIS — F419 Anxiety disorder, unspecified: Secondary | ICD-10-CM

## 2024-01-07 DIAGNOSIS — D500 Iron deficiency anemia secondary to blood loss (chronic): Secondary | ICD-10-CM

## 2024-01-07 NOTE — Telephone Encounter (Signed)
 Last apt 09/15/23.

## 2024-01-12 ENCOUNTER — Ambulatory Visit (HOSPITAL_BASED_OUTPATIENT_CLINIC_OR_DEPARTMENT_OTHER): Payer: Self-pay | Admitting: Certified Nurse Midwife

## 2024-01-12 ENCOUNTER — Encounter (HOSPITAL_BASED_OUTPATIENT_CLINIC_OR_DEPARTMENT_OTHER): Payer: Self-pay | Admitting: Certified Nurse Midwife

## 2024-01-12 VITALS — BP 147/76 | HR 68 | Ht 63.0 in | Wt 257.0 lb

## 2024-01-12 DIAGNOSIS — Z780 Asymptomatic menopausal state: Secondary | ICD-10-CM

## 2024-01-12 DIAGNOSIS — L720 Epidermal cyst: Secondary | ICD-10-CM | POA: Insufficient documentation

## 2024-01-12 NOTE — Progress Notes (Signed)
Small inclusion cyst perineum (left) Nontender, no erythema Pap UTD  Subjective:     Vanessa Copeland is a 58 y.o. female G15P3 Married here for problem gyn visit. States she has some small (very small) nodules in the labia that have been present for "years". They have never increased in size and have never been painful or swollen. She recently noticed a new one in the perineal area. Non-tender. Denies vaginal spotting or bleeding. Thinks she is menopausal but would like FSH to confirm.   The following portions of the patient's history were reviewed and updated as appropriate: allergies, current medications, past family history, past medical history, past social history, past surgical history, and problem list.   Review of Systems Pertinent items are noted in HPI.    Objective:    BP (!) 147/76 (BP Location: Left Arm, Patient Position: Sitting, Cuff Size: Large)   Pulse 68   Ht 5\' 3"  (1.6 m)   Wt 257 lb (116.6 kg)   LMP  (LMP Unknown) Comment: no period  BMI 45.53 kg/m  General appearance: alert, cooperative, and appears stated age Pelvic: cervix normal in appearance, external genitalia normal, no adnexal masses or tenderness, no cervical motion tenderness, rectovaginal septum normal, uterus normal size, shape, and consistency, vagina normal without discharge, and tiny palpable epidural inclusion cyst noted slightly left perineum, non-tender.     Assessment:    Epidermal inclusion cyst-perineum .    Plan:    Pt reassured that epidermal inclusion cysts benign. She will RTO if cyst enlarges or changes or becomes tender or painful. RTO as scheduled for next annual gynecological exam. Pt desired FSH today to confirm menopausal status.  Vanessa Copeland

## 2024-01-13 ENCOUNTER — Encounter (HOSPITAL_BASED_OUTPATIENT_CLINIC_OR_DEPARTMENT_OTHER): Payer: Self-pay | Admitting: Certified Nurse Midwife

## 2024-01-13 LAB — FOLLICLE STIMULATING HORMONE: FSH: 32.6 m[IU]/mL

## 2024-01-21 ENCOUNTER — Encounter: Payer: Self-pay | Admitting: Nurse Practitioner

## 2024-01-21 ENCOUNTER — Ambulatory Visit: Payer: Self-pay | Admitting: Nurse Practitioner

## 2024-01-21 VITALS — BP 140/86 | HR 71 | Wt 255.4 lb

## 2024-01-21 DIAGNOSIS — Z6841 Body Mass Index (BMI) 40.0 and over, adult: Secondary | ICD-10-CM

## 2024-01-21 DIAGNOSIS — D500 Iron deficiency anemia secondary to blood loss (chronic): Secondary | ICD-10-CM

## 2024-01-21 DIAGNOSIS — I10 Essential (primary) hypertension: Secondary | ICD-10-CM

## 2024-01-21 DIAGNOSIS — E7849 Other hyperlipidemia: Secondary | ICD-10-CM

## 2024-01-21 DIAGNOSIS — K760 Fatty (change of) liver, not elsewhere classified: Secondary | ICD-10-CM

## 2024-01-21 DIAGNOSIS — G4733 Obstructive sleep apnea (adult) (pediatric): Secondary | ICD-10-CM

## 2024-01-21 DIAGNOSIS — R7303 Prediabetes: Secondary | ICD-10-CM

## 2024-01-21 DIAGNOSIS — E66813 Obesity, class 3: Secondary | ICD-10-CM | POA: Diagnosis not present

## 2024-01-21 HISTORY — DX: Essential (primary) hypertension: I10

## 2024-01-21 MED ORDER — ROSUVASTATIN CALCIUM 10 MG PO TABS: ORAL_TABLET | ORAL | 3 refills | Status: DC

## 2024-01-21 MED ORDER — WEGOVY 0.25 MG/0.5ML ~~LOC~~ SOAJ: 0.2500 mg | SUBCUTANEOUS | 0 refills | Status: DC

## 2024-01-21 MED ORDER — HYDROCHLOROTHIAZIDE 25 MG PO TABS: 25.0000 mg | ORAL_TABLET | Freq: Every day | ORAL | 11 refills | Status: DC

## 2024-01-21 NOTE — Assessment & Plan Note (Signed)
Elevated cholesterol. Previously on Crestor, discontinued based on another provider's recommendation. Considering resuming Crestor due to potential impact on blood pressure and overall cardiovascular health. Crestor can be taken twice a week and is effective in managing cholesterol levels. I do feel it is in her best interest in the setting of elevated lipids, prediabetes/insulin resistance, obesity, and hypertension to restart this medication for cardiovascular risk benefit.  - Prescribe Crestor to be taken twice a week - Check cholesterol levels with upcoming blood work

## 2024-01-21 NOTE — Patient Instructions (Addendum)
If you want to get the cardiac CT to look at the heart vessels and see if there is build up, you can let me know. The cost of this is $99 and it does not use insurance.   I have sent in hydrochlorothiazide for the blood pressure. I would like you to check the blood pressure twice a day and keep a log of this for me.  It may take 2 weeks for your blood pressure to fully stabilize with the new medication.  If it is running higher than 180/90, please let us know.     Hypertension, Adult High blood pressure (hypertension) is when the force of blood pumping through the arteries is too strong. The arteries are the blood vessels that carry blood from the heart throughout the body. Hypertension forces the heart to work harder to pump blood and may cause arteries to become narrow or stiff. Untreated or uncontrolled hypertension can lead to a heart attack, heart failure, a stroke, kidney disease, and other problems. A blood pressure reading consists of a higher number over a lower number. Ideally, your blood pressure should be below 120/80. The first ("top") number is called the systolic pressure. It is a measure of the pressure in your arteries as your heart beats. The second ("bottom") number is called the diastolic pressure. It is a measure of the pressure in your arteries as the heart relaxes. What are the causes? The exact cause of this condition is not known. There are some conditions that result in high blood pressure. What increases the risk? Certain factors may make you more likely to develop high blood pressure. Some of these risk factors are under your control, including: Smoking. Not getting enough exercise or physical activity. Being overweight. Having too much fat, sugar, calories, or salt (sodium) in your diet. Drinking too much alcohol. Other risk factors include: Having a personal history of heart disease, diabetes, high cholesterol, or kidney disease. Stress. Having a family history of  high blood pressure and high cholesterol. Having obstructive sleep apnea. Age. The risk increases with age. What are the signs or symptoms? High blood pressure may not cause symptoms. Very high blood pressure (hypertensive crisis) may cause: Headache. Fast or irregular heartbeats (palpitations). Shortness of breath. Nosebleed. Nausea and vomiting. Vision changes. Severe chest pain, dizziness, and seizures. How is this diagnosed? This condition is diagnosed by measuring your blood pressure while you are seated, with your arm resting on a flat surface, your legs uncrossed, and your feet flat on the floor. The cuff of the blood pressure monitor will be placed directly against the skin of your upper arm at the level of your heart. Blood pressure should be measured at least twice using the same arm. Certain conditions can cause a difference in blood pressure between your right and left arms. If you have a high blood pressure reading during one visit or you have normal blood pressure with other risk factors, you may be asked to: Return on a different day to have your blood pressure checked again. Monitor your blood pressure at home for 1 week or longer. If you are diagnosed with hypertension, you may have other blood or imaging tests to help your health care provider understand your overall risk for other conditions. How is this treated? This condition is treated by making healthy lifestyle changes, such as eating healthy foods, exercising more, and reducing your alcohol intake. You may be referred for counseling on a healthy diet and physical activity. Your health  care provider may prescribe medicine if lifestyle changes are not enough to get your blood pressure under control and if: Your systolic blood pressure is above 130. Your diastolic blood pressure is above 80. Your personal target blood pressure may vary depending on your medical conditions, your age, and other factors. Follow these  instructions at home: Eating and drinking  Eat a diet that is high in fiber and potassium, and low in sodium, added sugar, and fat. An example of this eating plan is called the DASH diet. DASH stands for Dietary Approaches to Stop Hypertension. To eat this way: Eat plenty of fresh fruits and vegetables. Try to fill one half of your plate at each meal with fruits and vegetables. Eat whole grains, such as whole-wheat pasta, brown rice, or whole-grain bread. Fill about one fourth of your plate with whole grains. Eat or drink low-fat dairy products, such as skim milk or low-fat yogurt. Avoid fatty cuts of meat, processed or cured meats, and poultry with skin. Fill about one fourth of your plate with lean proteins, such as fish, chicken without skin, beans, eggs, or tofu. Avoid pre-made and processed foods. These tend to be higher in sodium, added sugar, and fat. Reduce your daily sodium intake. Many people with hypertension should eat less than 1,500 mg of sodium a day. Do not drink alcohol if: Your health care provider tells you not to drink. You are pregnant, may be pregnant, or are planning to become pregnant. If you drink alcohol: Limit how much you have to: 0-1 drink a day for women. 0-2 drinks a day for men. Know how much alcohol is in your drink. In the [place] one drink equals one 12 oz bottle of beer (355 mL), one 5 oz glass of wine (148 mL), or one 1 oz glass of hard liquor (44 mL). Lifestyle  Work with your health care provider to maintain a healthy body weight or to lose weight. Ask what an ideal weight is for you. Get at least 30 minutes of exercise that causes your heart to beat faster (aerobic exercise) most days of the week. Activities may include walking, swimming, or biking. Include exercise to strengthen your muscles (resistance exercise), such as Pilates or lifting weights, as part of your weekly exercise routine. Try to do these types of exercises for 30 minutes at least 3 days  a week. Do not use any products that contain nicotine or tobacco. These products include cigarettes, chewing tobacco, and vaping devices, such as e-cigarettes. If you need help quitting, ask your health care provider. Monitor your blood pressure at home as told by your health care provider. Keep all follow-up visits. This is important. Medicines Take over-the-counter and prescription medicines only as told by your health care provider. Follow directions carefully. Blood pressure medicines must be taken as prescribed. Do not skip doses of blood pressure medicine. Doing this puts you at risk for problems and can make the medicine less effective. Ask your health care provider about side effects or reactions to medicines that you should watch for. Contact a health care provider if you: Think you are having a reaction to a medicine you are taking. Have headaches that keep coming back (recurring). Feel dizzy. Have swelling in your ankles. Have trouble with your vision. Get help right away if you: Develop a severe headache or confusion. Have unusual weakness or numbness. Feel faint. Have severe pain in your chest or abdomen. Vomit repeatedly. Have trouble breathing. These symptoms may be an emergency.  Get help right away. Call 911. Do not wait to see if the symptoms will go away. Do not drive yourself to the hospital. Summary Hypertension is when the force of blood pumping through your arteries is too strong. If this condition is not controlled, it may put you at risk for serious complications. Your personal target blood pressure may vary depending on your medical conditions, your age, and other factors. For most people, a normal blood pressure is less than 120/80. Hypertension is treated with lifestyle changes, medicines, or a combination of both. Lifestyle changes include losing weight, eating a healthy, low-sodium diet, exercising more, and limiting alcohol. This information is not intended  to replace advice given to you by your health care provider. Make sure you discuss any questions you have with your health care provider. Document Revised: 10/15/2021 Document Reviewed: 10/15/2021 Elsevier Patient Education  2024 [employer].

## 2024-01-21 NOTE — Assessment & Plan Note (Signed)
Insulin resistance with prediabetes. Previously on semaglutide [facility]), which helped control blood pressure and weight. Current compounded semaglutide may be less effective. Recent blood work needed to assess current status. Discussed the importance of weight management in controlling blood pressure and prediabetes. Insurance coverage for semaglutide may depend on lab results. - Order blood work to check A1c and cholesterol levels - Discuss potential for insurance coverage for semaglutide based on lab results

## 2024-01-21 NOTE — Progress Notes (Signed)
  Tollie Eth, DNP, AGNP-c [facility] [address] [place], [place] [address] [phone]   ACUTE VISIT- ESTABLISHED PATIENT  Blood pressure (!) 140/86, pulse 71, weight 255 lb 6.4 oz (115.8 kg).  Subjective:  HPI Vanessa Copeland is a 58 y.o. female presents to day for evaluation of acute concern(s).   History of Present Illness Vanessa Copeland presents with elevated blood pressure.  She has been experiencing elevated blood pressure, with a recent home reading of 173/80-90. No significant headaches, dizziness, or vision changes, but she mentions a slight headache occasionally. No new urinary symptoms, but she experiences nocturia once or twice a night, which is not new.  She has a history of using semaglutide [facility]) for weight management for almost two years, which was stopped abruptly last year due to insurance issues. Since then, she has been using a compounded version of semaglutide, but is unsure of its exact composition. Her blood pressure was well-controlled while on Wegovy but has been high since switching to the compounded version. She is unsure if the compounded medication is affecting her blood pressure.  She is trying to manage her weight by modifying her diet, such as reducing bacon intake due to its salt content. She is frustrated with her inability to obtain effective medication for weight management, which she believes would help control her blood pressure.  She has a history of prediabetes and is concerned about her A1c and cholesterol levels, which she would like to have checked. She has not been taking Crestor regularly, as advised by a previous doctor with healthy weight and wellness but is considering resuming it twice a week as she believes stopping it may have affected her cholesterol levels.  She denies using a CPAP for sleep apnea, stating it was not severe enough to require one. She occasionally experiences a deep cough when laughing and has a history  of mild asthma, sometimes noticing wheezing. She takes Tylenol occasionally for aches and has a prescription for 800 mg ibuprofen for jaw pain, which she uses as needed.  Her husband is a Education officer, environmental, which contributes to her stress levels.  ROS negative except for what is listed in HPI. History, Medications, Surgery, SDOH, and Family History reviewed and updated as appropriate.  Objective:  Physical Exam Vitals and nursing note reviewed.  Constitutional:      Appearance: Normal appearance.  HENT:     Head: Normocephalic.  Eyes:     Pupils: Pupils are equal, round, and reactive to light.  Neck:     Vascular: No carotid bruit.  Cardiovascular:     Rate and Rhythm: Normal rate and regular rhythm.     Pulses: Normal pulses.     Heart sounds: Normal heart sounds.  Pulmonary:     Effort: Pulmonary effort is normal.     Breath sounds: Normal breath sounds.  Musculoskeletal:        General: Normal range of motion.     Cervical back: Normal range of motion.  Skin:    General: Skin is warm.  Neurological:     General: No focal deficit present.     Mental Status: She is alert and oriented to person, place, and time.  Psychiatric:        Mood and Affect: Mood normal.         Assessment & Plan:   Problem List Items Addressed This Visit   None     Tollie Eth, DNP, AGNP-c

## 2024-01-21 NOTE — Assessment & Plan Note (Signed)
Elevated blood pressure readings recently, with a significant increase to 173/80-90. Previously controlled on Wegovy, discontinued due to insurance issues. Currently on compounded semaglutide, possibly less effective. Contributing factors may include stress, potential iron deficiency, thyroid issues, and sleep apnea. No significant headaches, dizziness, or vision changes. Slight headache and foot swelling noted. Discussed hydrochlorothiazide (HCTZ) for blood pressure control, which may cause increased urination but is generally well-tolerated. If HCTZ is insufficient, combination pills are available. Emphasized the importance of monitoring blood pressure to avoid complications such as stroke. - Prescribe hydrochlorothiazide (HCTZ) - Check blood pressure at least once daily, in the morning and before bed - Use an arm cuff for accurate readings - Maintain a log of blood pressure readings - Follow up in one month via video or telephone visit - Report if blood pressure exceeds 180/90

## 2024-01-21 NOTE — Assessment & Plan Note (Signed)
General Health Maintenance Actively working on [employer] and dietary modifications. Discussed potential benefits of a cardiac calcium score to assess cardiovascular risk. The test costs $99 out-of-pocket and can provide valuable information about cardiovascular health without invasive procedures. - Consider cardiac calcium score CT scan if interested - Continue dietary modifications and weight management efforts  Follow-up - Follow up in one month via video or telephone visit - Report if blood pressure exceeds 180/90 - Review lab results and adjust treatment plan as needed.

## 2024-01-22 ENCOUNTER — Other Ambulatory Visit: Payer: Self-pay | Admitting: Nurse Practitioner

## 2024-01-22 ENCOUNTER — Encounter: Payer: Self-pay | Admitting: Nurse Practitioner

## 2024-01-22 DIAGNOSIS — E7849 Other hyperlipidemia: Secondary | ICD-10-CM

## 2024-01-22 DIAGNOSIS — I10 Essential (primary) hypertension: Secondary | ICD-10-CM

## 2024-01-22 DIAGNOSIS — K760 Fatty (change of) liver, not elsewhere classified: Secondary | ICD-10-CM

## 2024-01-22 DIAGNOSIS — M2428 Disorder of ligament, vertebrae: Secondary | ICD-10-CM

## 2024-01-22 DIAGNOSIS — I340 Nonrheumatic mitral (valve) insufficiency: Secondary | ICD-10-CM

## 2024-01-22 DIAGNOSIS — G4733 Obstructive sleep apnea (adult) (pediatric): Secondary | ICD-10-CM

## 2024-01-22 DIAGNOSIS — N62 Hypertrophy of breast: Secondary | ICD-10-CM

## 2024-01-22 DIAGNOSIS — R7303 Prediabetes: Secondary | ICD-10-CM

## 2024-01-22 DIAGNOSIS — J4520 Mild intermittent asthma, uncomplicated: Secondary | ICD-10-CM

## 2024-01-22 MED ORDER — TIRZEPATIDE 2.5 MG/0.5ML ~~LOC~~ SOAJ: 2.5000 mg | SUBCUTANEOUS | 0 refills | Status: DC

## 2024-01-25 ENCOUNTER — Other Ambulatory Visit (HOSPITAL_COMMUNITY): Payer: Self-pay

## 2024-01-25 ENCOUNTER — Telehealth: Payer: Self-pay

## 2024-01-26 ENCOUNTER — Other Ambulatory Visit: Payer: Self-pay

## 2024-01-26 DIAGNOSIS — I10 Essential (primary) hypertension: Secondary | ICD-10-CM

## 2024-01-26 DIAGNOSIS — E7849 Other hyperlipidemia: Secondary | ICD-10-CM

## 2024-01-26 DIAGNOSIS — R7303 Prediabetes: Secondary | ICD-10-CM

## 2024-01-26 DIAGNOSIS — K760 Fatty (change of) liver, not elsewhere classified: Secondary | ICD-10-CM

## 2024-01-26 DIAGNOSIS — G4733 Obstructive sleep apnea (adult) (pediatric): Secondary | ICD-10-CM

## 2024-01-26 MED ORDER — WEGOVY 0.25 MG/0.5ML ~~LOC~~ SOAJ: 0.2500 mg | SUBCUTANEOUS | 0 refills | Status: DC

## 2024-01-27 ENCOUNTER — Encounter: Payer: Self-pay | Admitting: Nurse Practitioner

## 2024-01-27 ENCOUNTER — Ambulatory Visit (INDEPENDENT_AMBULATORY_CARE_PROVIDER_SITE_OTHER): Payer: Self-pay | Admitting: Internal Medicine

## 2024-01-28 ENCOUNTER — Other Ambulatory Visit: Payer: Self-pay

## 2024-01-28 DIAGNOSIS — R7303 Prediabetes: Secondary | ICD-10-CM

## 2024-01-28 DIAGNOSIS — E66813 Obesity, class 3: Secondary | ICD-10-CM

## 2024-01-28 DIAGNOSIS — I10 Essential (primary) hypertension: Secondary | ICD-10-CM

## 2024-01-28 DIAGNOSIS — G4733 Obstructive sleep apnea (adult) (pediatric): Secondary | ICD-10-CM

## 2024-01-28 DIAGNOSIS — K760 Fatty (change of) liver, not elsewhere classified: Secondary | ICD-10-CM

## 2024-01-28 DIAGNOSIS — E7849 Other hyperlipidemia: Secondary | ICD-10-CM

## 2024-01-28 LAB — LIPID PANEL
Chol/HDL Ratio: 4.2 {ratio} (ref 0.0–4.4)
Cholesterol, Total: 202 mg/dL — ABNORMAL HIGH (ref 100–199)
HDL: 48 mg/dL (ref >39)
LDL Chol Calc (NIH): 139 mg/dL — ABNORMAL HIGH (ref 0–99)
Triglycerides: 82 mg/dL (ref 0–149)
VLDL Cholesterol Cal: 15 mg/dL (ref 5–40)

## 2024-01-28 LAB — INSULIN, FREE AND TOTAL
Free Insulin: 5.2 uU/mL
Total Insulin: 5.2 uU/mL

## 2024-01-28 LAB — TSH: TSH: 2.16 u[IU]/mL (ref 0.450–4.500)

## 2024-01-28 LAB — HEMOGLOBIN A1C
Est. average glucose Bld gHb Est-mCnc: 120 mg/dL
Hgb A1c MFr Bld: 5.8 % — ABNORMAL HIGH (ref 4.8–5.6)

## 2024-01-28 LAB — CBC WITH DIFFERENTIAL/PLATELET
Basophils Absolute: 0 10*3/uL (ref 0.0–0.2)
Basos: 0 %
EOS (ABSOLUTE): 0.1 10*3/uL (ref 0.0–0.4)
Eos: 2 %
Hematocrit: 35.3 % (ref 34.0–46.6)
Hemoglobin: 11.5 g/dL (ref 11.1–15.9)
Immature Grans (Abs): 0 10*3/uL (ref 0.0–0.1)
Immature Granulocytes: 0 %
Lymphocytes Absolute: 3.1 10*3/uL (ref 0.7–3.1)
Lymphs: 44 %
MCH: 28.5 pg (ref 26.6–33.0)
MCHC: 32.6 g/dL (ref 31.5–35.7)
MCV: 88 fL (ref 79–97)
Monocytes Absolute: 0.5 10*3/uL (ref 0.1–0.9)
Monocytes: 7 %
Neutrophils Absolute: 3.3 10*3/uL (ref 1.4–7.0)
Neutrophils: 47 %
Platelets: 185 10*3/uL (ref 150–450)
RBC: 4.03 x10E6/uL (ref 3.77–5.28)
RDW: 15.1 % (ref 11.7–15.4)
WBC: 7 10*3/uL (ref 3.4–10.8)

## 2024-01-28 LAB — IRON,TIBC AND FERRITIN PANEL
Ferritin: 58 ng/mL (ref 15–150)
Iron Saturation: 16 % (ref 15–55)
Iron: 51 ug/dL (ref 27–159)
Total Iron Binding Capacity: 312 ug/dL (ref 250–450)
UIBC: 261 ug/dL (ref 131–425)

## 2024-01-28 LAB — COMPREHENSIVE METABOLIC PANEL
ALT: 13 [IU]/L (ref 0–32)
AST: 13 [IU]/L (ref 0–40)
Albumin: 4.2 g/dL (ref 3.8–4.9)
Alkaline Phosphatase: 88 [IU]/L (ref 44–121)
BUN/Creatinine Ratio: 20 (ref 9–23)
BUN: 15 mg/dL (ref 6–24)
Bilirubin Total: 0.6 mg/dL (ref 0.0–1.2)
CO2: 23 mmol/L (ref 20–29)
Calcium: 9.3 mg/dL (ref 8.7–10.2)
Chloride: 101 mmol/L (ref 96–106)
Creatinine, Ser: 0.74 mg/dL (ref 0.57–1.00)
Globulin, Total: 2.9 g/dL (ref 1.5–4.5)
Glucose: 84 mg/dL (ref 70–99)
Potassium: 4.3 mmol/L (ref 3.5–5.2)
Sodium: 140 mmol/L (ref 134–144)
Total Protein: 7.1 g/dL (ref 6.0–8.5)
eGFR: 95 mL/min/{1.73_m2} (ref >59)

## 2024-01-28 MED ORDER — WEGOVY 0.25 MG/0.5ML ~~LOC~~ SOAJ: 0.2500 mg | SUBCUTANEOUS | 0 refills | Status: DC

## 2024-01-31 ENCOUNTER — Other Ambulatory Visit: Payer: Self-pay | Admitting: Medical

## 2024-01-31 DIAGNOSIS — J302 Other seasonal allergic rhinitis: Secondary | ICD-10-CM

## 2024-02-08 ENCOUNTER — Ambulatory Visit (HOSPITAL_COMMUNITY)
Admission: RE | Admit: 2024-02-08 | Discharge: 2024-02-08 | Disposition: A | Discharge status: Home / Self Care | Payer: Self-pay | Source: Ambulatory Visit | Attending: Nurse Practitioner | Admitting: Nurse Practitioner

## 2024-02-08 DIAGNOSIS — K760 Fatty (change of) liver, not elsewhere classified: Secondary | ICD-10-CM | POA: Insufficient documentation

## 2024-02-08 DIAGNOSIS — Z6841 Body Mass Index (BMI) 40.0 and over, adult: Secondary | ICD-10-CM | POA: Insufficient documentation

## 2024-02-08 DIAGNOSIS — R7303 Prediabetes: Secondary | ICD-10-CM | POA: Insufficient documentation

## 2024-02-08 DIAGNOSIS — D500 Iron deficiency anemia secondary to blood loss (chronic): Secondary | ICD-10-CM | POA: Insufficient documentation

## 2024-02-08 DIAGNOSIS — I10 Essential (primary) hypertension: Secondary | ICD-10-CM | POA: Insufficient documentation

## 2024-02-08 DIAGNOSIS — E66813 Obesity, class 3: Secondary | ICD-10-CM | POA: Insufficient documentation

## 2024-02-08 DIAGNOSIS — G4733 Obstructive sleep apnea (adult) (pediatric): Secondary | ICD-10-CM | POA: Insufficient documentation

## 2024-02-08 DIAGNOSIS — E7849 Other hyperlipidemia: Secondary | ICD-10-CM | POA: Insufficient documentation

## 2024-02-17 ENCOUNTER — Encounter: Payer: Self-pay | Admitting: Nurse Practitioner

## 2024-02-18 ENCOUNTER — Telehealth: Payer: 59 | Admitting: Nurse Practitioner

## 2024-02-18 ENCOUNTER — Encounter: Payer: Self-pay | Admitting: Nurse Practitioner

## 2024-02-18 VITALS — BP 120/80 | Wt 242.0 lb

## 2024-02-18 DIAGNOSIS — R7303 Prediabetes: Secondary | ICD-10-CM | POA: Diagnosis not present

## 2024-02-18 DIAGNOSIS — Z6841 Body Mass Index (BMI) 40.0 and over, adult: Secondary | ICD-10-CM

## 2024-02-18 DIAGNOSIS — E785 Hyperlipidemia, unspecified: Secondary | ICD-10-CM | POA: Diagnosis not present

## 2024-02-18 DIAGNOSIS — E7849 Other hyperlipidemia: Secondary | ICD-10-CM

## 2024-02-18 DIAGNOSIS — K760 Fatty (change of) liver, not elsewhere classified: Secondary | ICD-10-CM

## 2024-02-18 DIAGNOSIS — E66813 Obesity, class 3: Secondary | ICD-10-CM

## 2024-02-18 DIAGNOSIS — I10 Essential (primary) hypertension: Secondary | ICD-10-CM

## 2024-02-18 MED ORDER — PHENTERMINE HCL 37.5 MG PO TABS: 37.5000 mg | ORAL_TABLET | Freq: Every day | ORAL | 0 refills | Status: DC

## 2024-02-18 MED ORDER — HYDROCHLOROTHIAZIDE 25 MG PO TABS: 25.0000 mg | ORAL_TABLET | Freq: Every day | ORAL | 3 refills | Status: AC

## 2024-02-18 NOTE — Progress Notes (Addendum)
 Virtual Visit Encounter mychart visit.   I connected with  Daralene Copeland on 02/18/2024 at 10:30 AM EST by secure video and audio telemedicine application. I verified that I am speaking with the correct person using two identifiers.   I introduced myself as a Publishing rights manager with the practice. The limitations of evaluation and management by telemedicine discussed with the patient and the availability of in person appointments. The patient expressed verbal understanding and consent to proceed.  Participating parties in this visit include: Myself and patient  The patient is: Patient Location: Home/vehicle I am: Provider Location: Office/Clinic Subjective:    CC and HPI: Vanessa Copeland is a 58 y.o. year old female presenting for follow up of Weight and medication/BP.  At home BP 110's/70's- 120's/80's  History of Present Illness Vanessa Copeland is a 58 year old female with prediabetes and hypertension who presents for a follow-up on weight management and medication review.  Blood pressure readings have been stable, typically around 127/80 mmHg, with occasional lower readings such as 113/70 mmHg. No current symptoms related to blood pressure are present.  She has a history of prediabetes with a recent A1c of 5.8%. Insulin levels are not elevated. The highest recorded A1c was 6.1%.  She faces challenges with weight management. Insurance has not approved coverage for weight loss medications like Mounjaro, Ozempic, or Wegovy due to her non-diabetic status. She has previously tried phentermine over ten years ago. She has been using semaglutide since mid-January, which has helped curb her appetite, particularly for sweets. She notes no desire for sweets and focuses on maintaining adequate protein intake. She mixes the semaglutide with sterile water for administration.  She plans to increase physical activity by walking more regularly, as she has been less active due to fatigue after work. She  acknowledges the importance of exercise and is motivated by improving weather conditions.  Current medications include rosuvastatin, taken every other day, and escitalopram, used as needed. She is unsure about the number of rosuvastatin pills remaining but believes she will run out before her next visit. Past medical history, Surgical history, Family history not pertinant except as noted below, Social history, Allergies, and medications have been entered into the medical record, reviewed, and corrections made.   Review of Systems:  All review of systems negative except what is listed in the HPI  Objective:    Alert and oriented x 4 Speaking in clear sentences with no shortness of breath. No distress.  Impression and Recommendations:    Problem List Items Addressed This Visit     Prediabetes   A1c is 5.8%. Insulin levels are not elevated. Currently using semaglutide, which has helped curb appetite and reduce sweet cravings. Discussed continuing semaglutide and exploring less expensive options. - Continue semaglutide injections - Consider referral to [facility] for potentially less expensive compounded semaglutide - Encourage regular physical activity and dietary modifications      Relevant Medications   phentermine (ADIPEX-P) 37.5 MG tablet   Morbid obesity (HCC)   Unable to obtain insurance coverage for weight loss medications like Mounjaro, Ozempic, and Wegovy. Phentermine discussed as a cost-effective alternative, with potential side effects including increased heart rate and blood pressure. Discussed short-term use and potential need for breaks. - Prescribe phentermine for three months - Monitor blood pressure closely while on phentermine - Encourage regular physical activity and dietary modifications - Reassess after three months and consider a break before potentially restarting phentermine      Relevant  Medications   phentermine (ADIPEX-P) 37.5 MG tablet    NAFLD (nonalcoholic fatty liver disease)   Diet and exercise recommendations along with medication to aid in weight management. No alarm symptoms at this time. Continue to monitor LFT and consider US  monitoring as needed for changes in labs, pain, or worsening cholesterol/weight.       Hyperlipidemia, pure   On rosuvastatin (Crestor) every other day. May need a refill soon. - Ensure prescription for rosuvastatin is up to date - Continue current dosing schedule of every other day      Relevant Medications   phentermine (ADIPEX-P) 37.5 MG tablet   hydrochlorothiazide (HYDRODIURIL) 25 MG tablet   Primary hypertension - Primary   Blood pressure readings around 127/80 and occasionally lower. Monitoring regularly. Phentermine discussed as a weight loss option, with potential side effects including increased heart rate and blood pressure. - Continue current antihypertensive medication - Monitor blood pressure regularly, especially if starting phentermine - Notify if blood pressure consistently exceeds 130/85      Relevant Medications   phentermine (ADIPEX-P) 37.5 MG tablet   hydrochlorothiazide (HYDRODIURIL) 25 MG tablet   Class 3 severe obesity with serious comorbidity and body mass index (BMI) of 45.0 to 49.9 in adult [facility])   Relevant Medications   phentermine (ADIPEX-P) 37.5 MG tablet    orders and follow up as documented in EMR I discussed the assessment and treatment plan with the patient. The patient was provided an opportunity to ask questions and all were answered. The patient agreed with the plan and demonstrated an understanding of the instructions.   The patient was advised to call back or seek an in-person evaluation if the symptoms worsen or if the condition fails to improve as anticipated.  Follow-Up: in 6 months  I provided 23 minutes of non-face-to-face interaction with this non face-to-face encounter including intake, same-day documentation, and chart review.   [name], NP , DNP, AGNP-c [facility]

## 2024-02-19 ENCOUNTER — Other Ambulatory Visit (HOSPITAL_COMMUNITY): Payer: Self-pay

## 2024-02-19 ENCOUNTER — Telehealth: Payer: Self-pay

## 2024-02-19 NOTE — Telephone Encounter (Signed)
 Pharmacy Patient Advocate Encounter   Received notification from CoverMyMeds that prior authorization for Phentermine HCl 37.5MG  tablets is required/requested.   Insurance verification completed.   The patient is insured through [facility] .   Per test claim: PA required; PA submitted to above mentioned insurance via CoverMyMeds Key/confirmation #/EOC (Key: [id number])        Status is pending

## 2024-02-20 ENCOUNTER — Other Ambulatory Visit (INDEPENDENT_AMBULATORY_CARE_PROVIDER_SITE_OTHER): Payer: Self-pay | Admitting: Internal Medicine

## 2024-02-20 DIAGNOSIS — F419 Anxiety disorder, unspecified: Secondary | ICD-10-CM

## 2024-02-22 ENCOUNTER — Other Ambulatory Visit (HOSPITAL_COMMUNITY): Payer: Self-pay

## 2024-02-22 NOTE — Telephone Encounter (Signed)
 Pharmacy Patient Advocate Encounter  Received notification from [facility] that Prior Authorization for Phentermine HCl 37.5MG  tablets has been APPROVED from 2.28.25 to 5.29.25. Ran test claim, Copay is $RTS, RX LAST FILLED ON 2.28.25. This test claim was processed through [facility]- copay amounts may vary at other pharmacies due to pharmacy/plan contracts, or as the patient moves through the different stages of their insurance plan.   PA #/Case ID/Reference #: Key: [id number]

## 2024-03-01 NOTE — Assessment & Plan Note (Signed)
 Blood pressure readings around 127/80 and occasionally lower. Monitoring regularly. Phentermine discussed as a weight loss option, with potential side effects including increased heart rate and blood pressure. - Continue current antihypertensive medication - Monitor blood pressure regularly, especially if starting phentermine - Notify if blood pressure consistently exceeds 130/85

## 2024-03-01 NOTE — Assessment & Plan Note (Signed)
 Unable to obtain insurance coverage for weight loss medications like Mounjaro, Ozempic, and [id number]. Phentermine discussed as a cost-effective alternative, with potential side effects including increased heart rate and blood pressure. Discussed short-term use and potential need for breaks. - Prescribe phentermine for three months - Monitor blood pressure closely while on phentermine - Encourage regular physical activity and dietary modifications - Reassess after three months and consider a break before potentially restarting phentermine

## 2024-03-01 NOTE — Assessment & Plan Note (Signed)
 A1c is 5.8%. Insulin levels are not elevated. Currently using semaglutide, which has helped curb appetite and reduce sweet cravings. Discussed continuing semaglutide and exploring less expensive options. - Continue semaglutide injections - Consider referral to [facility] for potentially less expensive compounded semaglutide - Encourage regular physical activity and dietary modifications

## 2024-03-01 NOTE — Assessment & Plan Note (Signed)
 On rosuvastatin (Crestor) every other day. May need a refill soon. - Ensure prescription for rosuvastatin is up to date - Continue current dosing schedule of every other day

## 2024-03-01 NOTE — Assessment & Plan Note (Signed)
 Diet and exercise recommendations along with medication to aid in weight management. No alarm symptoms at this time. Continue to monitor LFT and consider [place] monitoring as needed for changes in labs, pain, or worsening cholesterol/weight.

## 2024-03-03 ENCOUNTER — Ambulatory Visit: Admitting: Family Medicine

## 2024-03-03 ENCOUNTER — Encounter: Payer: Self-pay | Admitting: Family Medicine

## 2024-03-03 VITALS — BP 120/70 | HR 64 | Temp 98.3°F | Ht 63.0 in | Wt 246.6 lb

## 2024-03-03 DIAGNOSIS — N3000 Acute cystitis without hematuria: Secondary | ICD-10-CM

## 2024-03-03 DIAGNOSIS — J309 Allergic rhinitis, unspecified: Secondary | ICD-10-CM | POA: Diagnosis not present

## 2024-03-03 DIAGNOSIS — R3989 Other symptoms and signs involving the genitourinary system: Secondary | ICD-10-CM

## 2024-03-03 LAB — POCT URINALYSIS DIP (PROADVANTAGE DEVICE)
Bilirubin, UA: NEGATIVE
Glucose, UA: NEGATIVE mg/dL
Ketones, POC UA: NEGATIVE mg/dL
Nitrite, UA: POSITIVE — AB
Protein Ur, POC: NEGATIVE mg/dL
Specific Gravity, Urine: 1.02
Urobilinogen, Ur: 0.2
pH, UA: 6

## 2024-03-03 MED ORDER — NITROFURANTOIN MONOHYD MACRO 100 MG PO CAPS
100.0000 mg | ORAL_CAPSULE | Freq: Two times a day (BID) | ORAL | 0 refills | Status: DC
Start: 2024-03-03 — End: 2024-06-29

## 2024-03-03 NOTE — Patient Instructions (Signed)
 Stay well hydrated (drink lots of water). Continue the flonase--be sure to use just gentle sniffs. Add either claritin or zyrtec once daily, to help with allergy symptoms not adequately controlled by the Flonase. Your lungs are clear. You can consider using Mucinex (plain or DM), or Robitussin DM if you have develop more chest congestion and cough. Continue albuterol inhaler if needed.  We are sending your urine for cuture. There appears ot be an infection. We will give you final results via MyChart. Contact us if you develop fever, flank pain, persistent or worsening urinary symptoms, or other concerns.

## 2024-03-03 NOTE — Progress Notes (Signed)
 Chief Complaint  Patient presents with   Cough   Uretha Pain    Patient has had cough x 4 days, negative covid test this am. Slight runny nose. No fever chills or body aches. Has asthma so she has been using her albuterol. 3 days ago when she finishes urinating she has "pull' in her urethra. No burning, urgency or frequency.    4 days ago she started with a scratchy throat, then started coughing.  Cough was deep in the chest. Cough was dry, nonproductive. Hasn't taken any medication for cough, other than using her inhaler.  She did get some benefit from the inhaler (less tightness in the chest). Cough has improved since making the appointment. Slight runny nose, clear. No sinus pain, ear pain. Negative COVID test this morning.   She is also complaining of a "pulling" sensation at the end of voiding, which started 2 days ago. She has some frequency (unsure if worse than usual), no hematuria, burning pain. Denies vaginal discharge. Stable monogamous relationship, denies STD risk.  She has h/o kidney stones, none in the last month or so. Has chronic blood in the urine.  +sick contacts--works in a school.    PMH, PSH, SH reviewed  Outpatient Encounter Medications as of 03/03/2024  Medication Sig Note   albuterol (VENTOLIN HFA) 108 (90 Base) MCG/ACT inhaler Inhale 2 puffs into the lungs every 6 (six) hours as needed for wheezing or shortness of breath. 03/03/2024: Last used last night   escitalopram (LEXAPRO) 10 MG tablet Take 1 tablet (10 mg total) by mouth daily.    fluticasone (FLONASE) 50 MCG/ACT nasal spray SPRAY 2 SPRAYS INTO EACH NOSTRIL EVERY DAY    hydrochlorothiazide (HYDRODIURIL) 25 MG tablet Take 1 tablet (25 mg total) by mouth daily.    ibuprofen (ADVIL) 800 MG tablet TAKE 1 TABLET (800 MG TOTAL) BY MOUTH EVERY 8 (EIGHT) HOURS AS NEEDED FOR JAW PAIN 03/03/2024: Took one last night   rosuvastatin (CRESTOR) 10 MG tablet Take 1 tablet 3 nights a week for cholesterol.    EPINEPHrine  0.3 mg/0.3 mL IJ SOAJ injection Inject 0.3 mg into the muscle as needed for anaphylaxis. (Patient not taking: Reported on 03/03/2024) 03/03/2024: As needed   phentermine (ADIPEX-P) 37.5 MG tablet Take 1 tablet (37.5 mg total) by mouth daily before breakfast. (Patient not taking: Reported on 03/03/2024) 03/03/2024: Did not take this week   No facility-administered encounter medications on file as of 03/03/2024.   Allergies  Allergen Reactions   Shellfish Allergy Shortness Of Breath    ROS: no f/c, no ear pain, sinus pain, headaches or dizziness. +chest tightness/cough per HPI, which is improving. Urinary symptoms per HPI.    PHYSICAL EXAM:  BP 120/70   Pulse 64   Temp 98.3 F (36.8 C) (Tympanic)   Ht 5\' 3"  (1.6 m)   Wt 246 lb 9.6 oz (111.9 kg)   LMP  (LMP Unknown) Comment: no period  BMI 43.68 kg/m   Well-appearing, pleasant female, in good spirits. Occasional sniffling/sneeze during visit, no cough. HEENT: conjunctiva and sclera are clear, EOMI. TM's and EAC's normal. Nasal mucosa: mild-mod edema L>R, very pale. No purulence. Sinuses nontender. OP--mild cobblestoning posteriorly Neck: no lymphadenopathy or mass Heart: regular rate and rhythm Lungs clear bilaterally, no wheezes, rales, ronchi Abdomen: soft, nontender, no mass Back: no CVA tenderness Neuro: alert and oriented, cranial nerves intact. Normal gait Psych: normal mood, affect, hygiene and grooming  Urine:  cloudy, SG 1.020, moderate blood (this is chronic). +  nitrite and 3+ leuks.   ASSESSMENT/PLAN:  Acute cystitis without hematuria - culture sent. Treat with macrobid. pt with h/o stones, encouraged hydration - Plan: Urine Culture, nitrofurantoin, macrocrystal-monohydrate, (MACROBID) 100 MG capsule  Allergic rhinitis, unspecified seasonality, unspecified trigger - reviewed proper flonase technique. Add oral antihistamine  Urethral pain - Plan: POCT Urinalysis DIP (Proadvantage Device)  Stay well hydrated  (drink lots of water). Continue the flonase--be sure to use just gentle sniffs. Add either claritin or zyrtec once daily, to help with allergy symptoms not adequately controlled by the Flonase. Your lungs are clear. You can consider using Mucinex (plain or DM), or Robitussin DM if you have develop more chest congestion and cough. Continue albuterol inhaler if needed.  We are sending your urine for cuture. There appears ot be an infection. We will give you final results via MyChart. Contact us if you develop fever, flank pain, persistent or worsening urinary symptoms, or other concerns.

## 2024-03-06 ENCOUNTER — Encounter: Payer: Self-pay | Admitting: Family Medicine

## 2024-03-06 LAB — URINE CULTURE

## 2024-03-10 ENCOUNTER — Other Ambulatory Visit (HOSPITAL_COMMUNITY): Payer: Self-pay

## 2024-04-25 ENCOUNTER — Other Ambulatory Visit (HOSPITAL_COMMUNITY): Payer: Self-pay

## 2024-05-30 ENCOUNTER — Ambulatory Visit: Payer: Self-pay

## 2024-05-30 ENCOUNTER — Other Ambulatory Visit: Payer: Self-pay

## 2024-05-30 ENCOUNTER — Ambulatory Visit (HOSPITAL_COMMUNITY)
Admission: EM | Admit: 2024-05-30 | Discharge: 2024-05-30 | Disposition: A | Discharge status: Home / Self Care | Attending: Family Medicine | Admitting: Family Medicine

## 2024-05-30 ENCOUNTER — Encounter (HOSPITAL_COMMUNITY): Payer: Self-pay | Admitting: *Deleted

## 2024-05-30 ENCOUNTER — Ambulatory Visit: Admitting: Family Medicine

## 2024-05-30 DIAGNOSIS — L03011 Cellulitis of right finger: Secondary | ICD-10-CM | POA: Diagnosis not present

## 2024-05-30 MED ORDER — CEPHALEXIN 500 MG PO CAPS: 500.0000 mg | ORAL_CAPSULE | Freq: Four times a day (QID) | ORAL | 0 refills | Status: AC

## 2024-05-30 NOTE — Discharge Instructions (Addendum)
 Please take your antibiotic 1 pill 4 times a day.  Please do this for the next 7 days.  Please follow-up if you feel your fingers not getting better as we may need to drain it.

## 2024-05-30 NOTE — ED Provider Notes (Signed)
 [facility]    CSN: [id number] Arrival date & time: 05/30/24  8756      History   Chief Complaint Chief Complaint  Patient presents with   Hand Pain    HPI Vanessa Copeland is a 58 y.o. female.   Patient presenting for 5-day history of right index finger swelling and redness and pain.  Patient notes that she first noticed it approximately 5 days ago.  Did not injure it or hit it on anything.  Patient did not cut her nails recently either.  Patient has swelling of the distal phalanx as well as some redness and tenderness to palpation.  No obvious abscess noted.  No systemic symptoms.  No fevers or chills.   Hand Pain    Past Medical History:  Diagnosis Date   Allergy    Anemia    Anxiety    Asthma    mild intermittent(when allergies are flaring)   Complication of anesthesia    Food allergy    Shellfish   H/O echocardiogram 12/2005   Trace MR, mild TR, nl L ventricular systolic function.   Heart murmur    since birth. Never has had any problems   Hepatic steatosis    per CT   History of kidney stones    Kidney stones    Microscopic hematuria 01/16/2022   Morbid obesity (HCC) 04/29/2019   Obesity, unspecified    PONV (postoperative nausea and vomiting)    Primary hypertension 01/21/2024   Screening for STD (sexually transmitted disease) 10/21/2016   Vaccination phobia 02/27/2020   Vitamin D  deficiency 01/2010    Patient Active Problem List   Diagnosis Date Noted   Primary hypertension 01/21/2024   Epidermal inclusion cyst 01/12/2024   Class 3 severe obesity with serious comorbidity and body mass index (BMI) of 45.0 to 49.9 in adult 11/25/2023   Mitral regurgitation 07/11/2022   Drug-induced constipation 03/12/2022   Plantar fasciitis 02/18/2022   Shellfish allergy 02/03/2022   Hyperlipidemia, pure 08/29/2021   Depression 08/12/2021   Symptomatic mammary hypertrophy 01/04/2021   Lumbar radiculopathy 05/03/2020   Hypertrophy of ligamentum flavum  02/21/2020   Anxiety 02/02/2020   NAFLD (nonalcoholic fatty liver disease)    Prediabetes 10/11/2019   Morbid obesity (HCC) 10/11/2019   Asthma    History of kidney stones    Obstructive sleep apnea of adult 12/14/2017   Vitamin D  deficiency 10/21/2016   Encounter for annual physical exam 10/21/2016   Iron deficiency anemia due to chronic blood loss 10/04/2014   Allergic rhinitis 10/22/2012    Past Surgical History:  Procedure Laterality Date   CESAREAN SECTION     x3   COLONOSCOPY     CYSTOSCOPY WITH RETROGRADE PYELOGRAM, URETEROSCOPY AND STENT PLACEMENT Left 07/16/2018   Procedure: CYSTOSCOPY WITH RETROGRADE PYELOGRAM, URETEROSCOPY AND STENT PLACEMENT;  Surgeon: Marco Severs, MD;  Location: [facility];  Service: Urology;  Laterality: Left;   EXTRACORPOREAL SHOCK WAVE LITHOTRIPSY Left 06/21/2018   Procedure: LEFT EXTRACORPOREAL SHOCK WAVE LITHOTRIPSY (ESWL);  Surgeon: Trent Frizzle, MD;  Location: [facility];  Service: Urology;  Laterality: Left;   HOLMIUM LASER APPLICATION Left 07/16/2018   Procedure: HOLMIUM LASER APPLICATION;  Surgeon: Marco Severs, MD;  Location: [facility];  Service: Urology;  Laterality: Left;   LUMBAR DISC SURGERY  2019   OOPHORECTOMY  2002   left due to cyst   POLYPECTOMY     TUBAL LIGATION  1996    OB History  Gravida  3   Para  3   Term  3   Preterm  0   AB      Living  3      SAB      IAB      Ectopic      Multiple      Live Births  3            Home Medications    Prior to Admission medications   Medication Sig Start Date End Date Taking? Authorizing Provider  cephALEXin (KEFLEX) 500 MG capsule Take 1 capsule (500 mg total) by mouth 4 (four) times daily for 7 days. 05/30/24 06/06/24 Yes Taseen Marasigan, MD  hydrochlorothiazide  (HYDRODIURIL ) 25 MG tablet Take 1 tablet (25 mg total) by mouth daily. 02/18/24 02/17/25 Yes Early, Sara E, NP  ibuprofen  (ADVIL ) 800 MG tablet TAKE 1 TABLET (800 MG TOTAL) BY MOUTH EVERY 8  (EIGHT) HOURS AS NEEDED FOR JAW PAIN 01/08/24  Yes Early, Sara E, NP  rosuvastatin  (CRESTOR ) 10 MG tablet Take 1 tablet 3 nights a week for cholesterol. 01/21/24  Yes Early, Sara E, NP  albuterol  (VENTOLIN  HFA) 108 (90 Base) MCG/ACT inhaler Inhale 2 puffs into the lungs every 6 (six) hours as needed for wheezing or shortness of breath. 09/24/23   Early, Sara E, NP  EPINEPHrine  0.3 mg/0.3 mL IJ SOAJ injection Inject 0.3 mg into the muscle as needed for anaphylaxis. Patient not taking: Reported on 03/03/2024 10/09/22   Tysinger, Christiane Cowing, PA-C  escitalopram  (LEXAPRO ) 10 MG tablet Take 1 tablet (10 mg total) by mouth daily. 11/25/23   Ladd Picker, MD  fluticasone  (FLONASE ) 50 MCG/ACT nasal spray SPRAY 2 SPRAYS INTO EACH NOSTRIL EVERY DAY 02/01/24   Tysinger, Christiane Cowing, PA-C  nitrofurantoin , macrocrystal-monohydrate, (MACROBID ) 100 MG capsule Take 1 capsule (100 mg total) by mouth 2 (two) times daily. 03/03/24   Roosvelt Colla, MD  phentermine  (ADIPEX-P ) 37.5 MG tablet Take 1 tablet (37.5 mg total) by mouth daily before breakfast. Patient not taking: Reported on 03/03/2024 02/18/24   Early, Adriane Albe, NP    Family History Family History  Problem Relation Age of Onset   Hypertension Mother    Anemia Mother    Colon polyps Mother    Anxiety disorder Mother    Cancer Father        lung   Kidney Stones Father    Hypertension Father    Lung cancer Father 31   Asthma Brother    Asthma Daughter    Allergies Daughter    Asthma Daughter    Hypertension Brother    Breast cancer Neg Hx    Colon cancer Neg Hx    Diabetes Neg Hx    Heart disease Neg Hx    Esophageal cancer Neg Hx    Rectal cancer Neg Hx    Stomach cancer Neg Hx     Social History Social History   Tobacco Use   Smoking status: Never   Smokeless tobacco: Never  Vaping Use   Vaping status: Never Used  Substance Use Topics   Alcohol use: No   Drug use: No     Allergies   Shellfish allergy   Review of Systems Review of  Systems   Physical Exam Triage Vital Signs ED Triage Vitals  Encounter Vitals Group     BP 05/30/24 0853 127/80     Systolic BP Percentile --      Diastolic BP Percentile --  Pulse Rate 05/30/24 0853 67     Resp 05/30/24 0853 20     Temp 05/30/24 0853 97.7 F (36.5 C)     Temp src --      SpO2 05/30/24 0853 96 %     Weight --      Height --      Head Circumference --      Peak Flow --      Pain Score 05/30/24 0850 4     Pain Loc --      Pain Education --      Exclude from Growth Chart --    No data found.  Updated Vital Signs BP 127/80   Pulse 67   Temp 97.7 F (36.5 C)   Resp 20   LMP  (LMP Unknown) Comment: no period  SpO2 96%   Visual Acuity Right Eye Distance:   Left Eye Distance:   Bilateral Distance:    Right Eye Near:   Left Eye Near:    Bilateral Near:     Physical Exam Inspection reveals swelling of the right index DIP, there are some mild redness and warmth as well as some tenderness to palpation.  No fluctuance noted.  Range of motion is full with flexion extension.  UC Treatments / Results  Labs (all labs ordered are listed, but only abnormal results are displayed) Labs Reviewed - No data to display  EKG   Radiology No results found.  Procedures Procedures (including critical care time)  Medications Ordered in UC Medications - No data to display  Initial Impression / Assessment and Plan / UC Course  I have reviewed the triage vital signs and the nursing notes.  Pertinent labs & imaging results that were available during my care of the patient were reviewed by me and considered in my medical decision making (see chart for details).     Suspect patient is likely dealing with some cellulitis of the DIP/paronychia at this time.  No obvious abscess or area of drainage that could benefit from an I&D.  Will go ahead and do Keflex for the next 7 days.  Patient follow-up if no improvement. Final Clinical Impressions(s) / UC Diagnoses    Final diagnoses:  Cellulitis of finger of right hand     Discharge Instructions      Please take your antibiotic 1 pill 4 times a day.  Please do this for the next 7 days.  Please follow-up if you feel your fingers not getting better as we may need to drain it.   ED Prescriptions     Medication Sig Dispense Auth. Provider   cephALEXin (KEFLEX) 500 MG capsule Take 1 capsule (500 mg total) by mouth 4 (four) times daily for 7 days. 28 capsule Bobby Ragan, MD      PDMP not reviewed this encounter.   Jude Norton, MD 05/30/24 [phone]

## 2024-05-30 NOTE — ED Triage Notes (Signed)
 PT reports Rt index finger pain and swelling for 4 days.

## 2024-05-30 NOTE — Telephone Encounter (Signed)
 Already been seen at [facility]

## 2024-05-30 NOTE — Telephone Encounter (Signed)
    FYI Only or Action Required?: FYI only for provider  Patient was last seen in primary care on 03/03/2024 by Roosvelt Colla, MD. Called Nurse Triage reporting Hand Pain. Symptoms began several days ago. Interventions attempted: Nothing. Symptoms are: stable.  Triage Disposition: See PCP When Office is Open (Within 3 Days)  Patient/caregiver understands and will follow disposition?: YesCopied from CRM [id number]. Topic: Clinical - Red Word Triage >> May 30, 2024  8:03 AM Fonda T wrote: [facility] that prompted transfer to Nurse Triage: Pain and swelling, and redness, right index finger, per patient progressively getting worse, thinks it may be an infection Reason for Disposition  [1] After 3 days AND [2] pain not improved  Answer Assessment - Initial Assessment Questions 1. MECHANISM: "How did the injury happen?"      It just started, "I don't know how" 2. ONSET: "When did the injury happen?" (Minutes or hours ago)      Four days go 3. LOCATION: "What part of the finger is injured?" "Is the nail damaged?"      Index finger on right hand  4. APPEARANCE of the INJURY: "What does the injury look like?"      Swollen, hot to the touch 5. SEVERITY: "Can you use the hand normally?"  "Can you bend your fingers into a ball and then fully open them?"     When she bend it can feel the pressure in it 6. SIZE: For cuts, bruises, or swelling, ask: "How large is it?" (e.g., inches or centimeters;  entire finger)      no 7. PAIN: "Is there pain?" If Yes, ask: "How bad is the pain?"    (e.g., Scale 1-10; or mild, moderate, severe)  - NONE (0): no pain.  - MILD (1-3): doesn't interfere with normal activities.   - MODERATE (4-7): interferes with normal activities or awakens from sleep.  - SEVERE (8-10): excruciating pain, unable to hold a glass of water or bend finger even a little.     Throbs at night, mild to moderate 8. TETANUS: For any breaks in the skin, ask: "When was the last tetanus booster?"      na 9. OTHER SYMPTOMS: "Do you have any other symptoms?"     na 10. PREGNANCY: "Is there any chance you are pregnant?" "When was your last menstrual period?"       na  Protocols used: Finger Injury-A-AH Declined apt for today; states unable to make it.

## 2024-06-03 ENCOUNTER — Other Ambulatory Visit: Payer: BC Managed Care – PPO

## 2024-06-03 ENCOUNTER — Telehealth: Payer: Self-pay

## 2024-06-03 ENCOUNTER — Ambulatory Visit: Payer: BC Managed Care – PPO | Admitting: Nurse Practitioner

## 2024-06-03 NOTE — Telephone Encounter (Signed)
 Would you like pt. To get labs done before her CPE which is in September.   Copied from CRM [id number]. Topic: Clinical - Lab/Test Results >> Jun 03, 2024  2:01 PM Vanessa Copeland wrote: Reason for CRM: Patient would like to have lab orders placed in chart for overall lab work to be done//Please advise patient

## 2024-06-06 ENCOUNTER — Other Ambulatory Visit: Payer: Self-pay

## 2024-06-06 DIAGNOSIS — D500 Iron deficiency anemia secondary to blood loss (chronic): Secondary | ICD-10-CM

## 2024-06-06 DIAGNOSIS — E7849 Other hyperlipidemia: Secondary | ICD-10-CM

## 2024-06-06 DIAGNOSIS — R7303 Prediabetes: Secondary | ICD-10-CM

## 2024-06-10 ENCOUNTER — Other Ambulatory Visit: Payer: Self-pay | Admitting: Nurse Practitioner

## 2024-06-21 ENCOUNTER — Ambulatory Visit (HOSPITAL_BASED_OUTPATIENT_CLINIC_OR_DEPARTMENT_OTHER)

## 2024-06-21 ENCOUNTER — Encounter (HOSPITAL_BASED_OUTPATIENT_CLINIC_OR_DEPARTMENT_OTHER): Payer: Self-pay | Admitting: Student

## 2024-06-21 ENCOUNTER — Ambulatory Visit (HOSPITAL_BASED_OUTPATIENT_CLINIC_OR_DEPARTMENT_OTHER): Admitting: Student

## 2024-06-21 DIAGNOSIS — M25511 Pain in right shoulder: Secondary | ICD-10-CM

## 2024-06-21 DIAGNOSIS — M67911 Unspecified disorder of synovium and tendon, right shoulder: Secondary | ICD-10-CM

## 2024-06-21 MED ORDER — LIDOCAINE HCL 1 % IJ SOLN
4.0000 mL | INTRAMUSCULAR | Status: AC | PRN
  Administered 2024-06-21: 4 mL

## 2024-06-21 MED ORDER — TRIAMCINOLONE ACETONIDE 40 MG/ML IJ SUSP
2.0000 mL | INTRAMUSCULAR | Status: AC | PRN
  Administered 2024-06-21: 2 mL via INTRA_ARTICULAR

## 2024-06-21 NOTE — Progress Notes (Signed)
 Chief Complaint: Right shoulder pain    Discussed the use of AI scribe software for clinical note transcription with the patient, who gave verbal consent to proceed.  History of Present Illness Vanessa Copeland is a 58 year old female who presents with right shoulder pain.  She experiences right shoulder pain for over a week, described as a nagging sensation that worsens at night, affecting her sleep as she cannot lay on the affected side. The pain is localized to the right shoulder without radiation to the neck or arm. She has difficulty with movements, particularly raising her arm forward and overhead, which exacerbates the pain. She denies any known injury to the right shoulder but mentions a recent move without heavy lifting. She is right-handed but uses her left hand for certain tasks.   Surgical History:   None  PMH/PSH/Family History/Social History/Meds/Allergies:    Past Medical History:  Diagnosis Date   Allergy    Anemia    Anxiety    Asthma    mild intermittent(when allergies are flaring)   Complication of anesthesia    Food allergy    Shellfish   H/O echocardiogram 12/2005   Trace MR, mild TR, nl L ventricular systolic function.   Heart murmur    since birth. Never has had any problems   Hepatic steatosis    per CT   History of kidney stones    Kidney stones    Microscopic hematuria 01/16/2022   Morbid obesity (HCC) 04/29/2019   Obesity, unspecified    PONV (postoperative nausea and vomiting)    Primary hypertension 01/21/2024   Screening for STD (sexually transmitted disease) 10/21/2016   Vaccination phobia 02/27/2020   Vitamin D  deficiency 01/2010   Past Surgical History:  Procedure Laterality Date   CESAREAN SECTION     x3   COLONOSCOPY     CYSTOSCOPY WITH RETROGRADE PYELOGRAM, URETEROSCOPY AND STENT PLACEMENT Left 07/16/2018   Procedure: CYSTOSCOPY WITH RETROGRADE PYELOGRAM, URETEROSCOPY AND STENT PLACEMENT;  Surgeon:  Sherrilee Belvie CROME, MD;  Location: [facility];  Service: Urology;  Laterality: Left;   EXTRACORPOREAL SHOCK WAVE LITHOTRIPSY Left 06/21/2018   Procedure: LEFT EXTRACORPOREAL SHOCK WAVE LITHOTRIPSY (ESWL);  Surgeon: Matilda Senior, MD;  Location: [facility];  Service: Urology;  Laterality: Left;   HOLMIUM LASER APPLICATION Left 07/16/2018   Procedure: HOLMIUM LASER APPLICATION;  Surgeon: Sherrilee Belvie CROME, MD;  Location: [facility];  Service: Urology;  Laterality: Left;   LUMBAR DISC SURGERY  2019   OOPHORECTOMY  2002   left due to cyst   POLYPECTOMY     TUBAL LIGATION  1996   Social History   Socioeconomic History   Marital status: Married    Spouse name: Emanii Bugbee   Number of children: 3   Years of education: Not on file   Highest education level: Not on file  Occupational History   Occupation: Musician for [employer]    Employer: [employer]  Tobacco Use   Smoking status: Never   Smokeless tobacco: Never  Vaping Use   Vaping status: Never Used  Substance and Sexual Activity   Alcohol use: No   Drug use: No   Sexual activity: Yes    Partners: Male    Birth control/protection: Surgical  Other Topics Concern   Not on file  Social History Narrative   Lives with husband, 2/3 children.  (2 daughters).  Son lives in [place].  1 dog   Social Drivers of Health   Financial Resource Strain: Low Risk  (12/04/2023)   Overall Financial Resource Strain (CARDIA)    Difficulty of Paying Living Expenses: Not hard at all  Food Insecurity: No Food Insecurity (12/04/2023)   Hunger Vital Sign    Worried About Running Out of Food in the Last Year: Never true    Ran Out of Food in the Last Year: Never true  Transportation Needs: No Transportation Needs (12/04/2023)   PRAPARE - Administrator, Civil Service (Medical): No    Lack of Transportation (Non-Medical): No  Physical Activity: Inactive (12/04/2023)   Exercise Vital Sign    Days of Exercise per Week: 0  days    Minutes of Exercise per Session: 0 min  Stress: No Stress Concern Present (12/04/2023)   [employer] of Occupational Health - Occupational Stress Questionnaire    Feeling of Stress : Not at all  Social Connections: Socially Integrated (12/04/2023)   Social Connection and Isolation Panel    Frequency of Communication with Friends and Family: More than three times a week    Frequency of Social Gatherings with Friends and Family: More than three times a week    Attends Religious Services: More than 4 times per year    Active Member of [employer] or Organizations: Yes    Attends Engineer, structural: More than 4 times per year    Marital Status: Married   Family History  Problem Relation Age of Onset   Hypertension Mother    Anemia Mother    Colon polyps Mother    Anxiety disorder Mother    Cancer Father        lung   Kidney Stones Father    Hypertension Father    Lung cancer Father 73   Asthma Brother    Asthma Daughter    Allergies Daughter    Asthma Daughter    Hypertension Brother    Breast cancer Neg Hx    Colon cancer Neg Hx    Diabetes Neg Hx    Heart disease Neg Hx    Esophageal cancer Neg Hx    Rectal cancer Neg Hx    Stomach cancer Neg Hx    Allergies  Allergen Reactions   Shellfish Allergy Shortness Of Breath   Current Outpatient Medications  Medication Sig Dispense Refill   albuterol  (VENTOLIN  HFA) 108 (90 Base) MCG/ACT inhaler Inhale 2 puffs into the lungs every 6 (six) hours as needed for wheezing or shortness of breath. 18 g 6   EPINEPHrine  0.3 mg/0.3 mL IJ SOAJ injection Inject 0.3 mg into the muscle as needed for anaphylaxis. (Patient not taking: Reported on 03/03/2024) 1 each 0   escitalopram  (LEXAPRO ) 10 MG tablet Take 1 tablet (10 mg total) by mouth daily. 90 tablet 0   fluticasone  (FLONASE ) 50 MCG/ACT nasal spray SPRAY 2 SPRAYS INTO EACH NOSTRIL EVERY DAY 48 mL 0   hydrochlorothiazide  (HYDRODIURIL ) 25 MG tablet Take 1 tablet (25 mg  total) by mouth daily. 90 tablet 3   ibuprofen  (ADVIL ) 800 MG tablet TAKE 1 TABLET (800 MG TOTAL) BY MOUTH EVERY 8 (EIGHT) HOURS AS NEEDED FOR JAW PAIN 60 tablet 3   nitrofurantoin , macrocrystal-monohydrate, (MACROBID ) 100 MG capsule Take 1 capsule (100 mg total) by mouth 2 (two) times daily. 10 capsule 0   phentermine  (ADIPEX-P ) 37.5 MG  tablet Take 1 tablet (37.5 mg total) by mouth daily before breakfast. (Patient not taking: Reported on 03/03/2024) 90 tablet 0   rosuvastatin  (CRESTOR ) 10 MG tablet Take 1 tablet 3 nights a week for cholesterol. 45 tablet 3   No current facility-administered medications for this visit.   No results found.  Review of Systems:   A ROS was performed including pertinent positives and negatives as documented in the HPI.  Physical Exam :   Constitutional: NAD and appears stated age Neurological: Alert and oriented Psych: Appropriate affect and cooperative There were no vitals taken for this visit.   Comprehensive Musculoskeletal Exam:    Exam of the right shoulder demonstrates some mild tenderness over the anterior glenohumeral joint.  Active range of motion to 130 degrees flexion, 80 degrees external rotation, and internal rotation to L4 with passive flexion to 160.  Negative speeds.  Positive empty can and Hawkins test.  Imaging:   Xray (right shoulder 3 views): Well-corticated calcification off of the greater tuberosity suggestive of calcific tendinitis.  No other evidence of bony abnormality.   I personally reviewed and interpreted the radiographs.      Assessment & Plan Right shoulder rotator cuff tendinopathy Patient is experiencing a 1 week history of atraumatic right shoulder pain that appears consistent with rotator cuff tendinopathy.  X-rays show likely calcific tendinitis but otherwise negative.  She does demonstrate a slight deficit in active flexion and pain with empty can testing but no significant weakness is appreciated.  Offered a  subacromial cortisone injection for symptomatic relief which she is agreeable to.  Injection was performed without a complication.  Recommend rotator cuff strengthening HEP and provided resources for this.  Can continue to utilize ibuprofen  as needed.  Plan to follow-up should symptoms persist or worsen.     Procedure Note  Patient: Vanessa Copeland             Date of Birth: Apr 25, 1966           MRN: [id number]             Visit Date: 06/21/2024  Procedures: Visit Diagnoses:  1. Tendinopathy of rotator cuff, right     Large Joint Inj: R subacromial bursa on 06/21/2024 2:20 PM Indications: pain Details: 22 G 1.5 in needle, posterior approach Medications: 4 mL lidocaine  1 %; 2 mL triamcinolone  acetonide 40 MG/ML Outcome: tolerated well, no immediate complications Procedure, treatment alternatives, risks and benefits explained, specific risks discussed. Consent was given by the patient. Immediately prior to procedure a time out was called to verify the correct patient, procedure, equipment, support staff and site/side marked as required. Patient was prepped and draped in the usual sterile fashion.      I personally saw and evaluated the patient, and participated in the management and treatment plan.  Leonce Reveal, PA-C Orthopedics

## 2024-06-23 ENCOUNTER — Ambulatory Visit (HOSPITAL_BASED_OUTPATIENT_CLINIC_OR_DEPARTMENT_OTHER): Admitting: Student

## 2024-06-23 ENCOUNTER — Other Ambulatory Visit: Payer: Self-pay | Admitting: Medical

## 2024-06-23 DIAGNOSIS — J302 Other seasonal allergic rhinitis: Secondary | ICD-10-CM

## 2024-06-28 ENCOUNTER — Ambulatory Visit (HOSPITAL_BASED_OUTPATIENT_CLINIC_OR_DEPARTMENT_OTHER): Admitting: Student

## 2024-06-28 ENCOUNTER — Other Ambulatory Visit (HOSPITAL_BASED_OUTPATIENT_CLINIC_OR_DEPARTMENT_OTHER): Payer: Self-pay

## 2024-06-28 DIAGNOSIS — M67911 Unspecified disorder of synovium and tendon, right shoulder: Secondary | ICD-10-CM

## 2024-06-28 MED ORDER — MELOXICAM 15 MG PO TABS
15.0000 mg | ORAL_TABLET | Freq: Every day | ORAL | 0 refills | Status: AC
  Filled 2024-06-28: qty 14, 14d supply, fill #0

## 2024-06-28 NOTE — Progress Notes (Signed)
 Chief Complaint: Right shoulder pain    History of Present Illness  06/28/24: Patient presents today for follow-up evaluation of right shoulder pain.  She reports that injection performed at last visit gave her maybe 1 day of relief however pain quickly returned.  She rates pain as severe and is located more over the lateral shoulder.  She has been taking 800 mg ibuprofen  for pain.  States that 2 days ago she had an occurrence of her shoulder feeling stuck.   06/21/24: Vanessa Copeland is a 58 year old female who presents with right shoulder pain. She experiences right shoulder pain for over a week, described as a nagging sensation that worsens at night, affecting her sleep as she cannot lay on the affected side. The pain is localized to the right shoulder without radiation to the neck or arm. She has difficulty with movements, particularly raising her arm forward and overhead, which exacerbates the pain. She denies any known injury to the right shoulder but mentions a recent move without heavy lifting. She is right-handed but uses her left hand for certain tasks.   Surgical History:   None  PMH/PSH/Family History/Social History/Meds/Allergies:    Past Medical History:  Diagnosis Date   Allergy    Anemia    Anxiety    Asthma    mild intermittent(when allergies are flaring)   Complication of anesthesia    Food allergy    Shellfish   H/O echocardiogram 12/2005   Trace MR, mild TR, nl L ventricular systolic function.   Heart murmur    since birth. Never has had any problems   Hepatic steatosis    per CT   History of kidney stones    Kidney stones    Microscopic hematuria 01/16/2022   Morbid obesity (HCC) 04/29/2019   Obesity, unspecified    PONV (postoperative nausea and vomiting)    Primary hypertension 01/21/2024   Screening for STD (sexually transmitted disease) 10/21/2016   Vaccination phobia 02/27/2020   Vitamin D  deficiency 01/2010   Past  Surgical History:  Procedure Laterality Date   CESAREAN SECTION     x3   COLONOSCOPY     CYSTOSCOPY WITH RETROGRADE PYELOGRAM, URETEROSCOPY AND STENT PLACEMENT Left 07/16/2018   Procedure: CYSTOSCOPY WITH RETROGRADE PYELOGRAM, URETEROSCOPY AND STENT PLACEMENT;  Surgeon: Sherrilee Belvie CROME, MD;  Location: [facility];  Service: Urology;  Laterality: Left;   EXTRACORPOREAL SHOCK WAVE LITHOTRIPSY Left 06/21/2018   Procedure: LEFT EXTRACORPOREAL SHOCK WAVE LITHOTRIPSY (ESWL);  Surgeon: Matilda Senior, MD;  Location: [facility];  Service: Urology;  Laterality: Left;   HOLMIUM LASER APPLICATION Left 07/16/2018   Procedure: HOLMIUM LASER APPLICATION;  Surgeon: Sherrilee Belvie CROME, MD;  Location: [facility];  Service: Urology;  Laterality: Left;   LUMBAR DISC SURGERY  2019   OOPHORECTOMY  2002   left due to cyst   POLYPECTOMY     TUBAL LIGATION  1996   Social History   Socioeconomic History   Marital status: Married    Spouse name: Mikinzie Maciejewski   Number of children: 3   Years of education: Not on file   Highest education level: Not on file  Occupational History   Occupation: Musician for [employer]    Employer: [employer]  Tobacco Use   Smoking status: Never   Smokeless  tobacco: Never  Vaping Use   Vaping status: Never Used  Substance and Sexual Activity   Alcohol use: No   Drug use: No   Sexual activity: Yes    Partners: Male    Birth control/protection: Surgical  Other Topics Concern   Not on file  Social History Narrative   Lives with husband, 2/3 children.  (2 daughters).  Son lives in [place].  1 dog   Social Drivers of Health   Financial Resource Strain: Low Risk  (12/04/2023)   Overall Financial Resource Strain (CARDIA)    Difficulty of Paying Living Expenses: Not hard at all  Food Insecurity: No Food Insecurity (12/04/2023)   Hunger Vital Sign    Worried About Running Out of Food in the Last Year: Never true    Ran Out of Food in the Last Year: Never true   Transportation Needs: No Transportation Needs (12/04/2023)   PRAPARE - Administrator, Civil Service (Medical): No    Lack of Transportation (Non-Medical): No  Physical Activity: Inactive (12/04/2023)   Exercise Vital Sign    Days of Exercise per Week: 0 days    Minutes of Exercise per Session: 0 min  Stress: No Stress Concern Present (12/04/2023)   [employer] of Occupational Health - Occupational Stress Questionnaire    Feeling of Stress : Not at all  Social Connections: Socially Integrated (12/04/2023)   Social Connection and Isolation Panel    Frequency of Communication with Friends and Family: More than three times a week    Frequency of Social Gatherings with Friends and Family: More than three times a week    Attends Religious Services: More than 4 times per year    Active Member of [employer] or Organizations: Yes    Attends Engineer, structural: More than 4 times per year    Marital Status: Married   Family History  Problem Relation Age of Onset   Hypertension Mother    Anemia Mother    Colon polyps Mother    Anxiety disorder Mother    Cancer Father        lung   Kidney Stones Father    Hypertension Father    Lung cancer Father 71   Asthma Brother    Asthma Daughter    Allergies Daughter    Asthma Daughter    Hypertension Brother    Breast cancer Neg Hx    Colon cancer Neg Hx    Diabetes Neg Hx    Heart disease Neg Hx    Esophageal cancer Neg Hx    Rectal cancer Neg Hx    Stomach cancer Neg Hx    Allergies  Allergen Reactions   Shellfish Allergy Shortness Of Breath   Current Outpatient Medications  Medication Sig Dispense Refill   meloxicam  (MOBIC ) 15 MG tablet Take 1 tablet (15 mg total) by mouth daily for 14 days. 14 tablet 0   albuterol  (VENTOLIN  HFA) 108 (90 Base) MCG/ACT inhaler Inhale 2 puffs into the lungs every 6 (six) hours as needed for wheezing or shortness of breath. 18 g 6   EPINEPHrine  0.3 mg/0.3 mL IJ SOAJ injection  Inject 0.3 mg into the muscle as needed for anaphylaxis. (Patient not taking: Reported on 03/03/2024) 1 each 0   escitalopram  (LEXAPRO ) 10 MG tablet Take 1 tablet (10 mg total) by mouth daily. 90 tablet 0   fluticasone  (FLONASE ) 50 MCG/ACT nasal spray SPRAY 2 SPRAYS INTO EACH NOSTRIL EVERY DAY 48 mL 1  hydrochlorothiazide  (HYDRODIURIL ) 25 MG tablet Take 1 tablet (25 mg total) by mouth daily. 90 tablet 3   ibuprofen  (ADVIL ) 800 MG tablet TAKE 1 TABLET (800 MG TOTAL) BY MOUTH EVERY 8 (EIGHT) HOURS AS NEEDED FOR JAW PAIN 60 tablet 3   nitrofurantoin , macrocrystal-monohydrate, (MACROBID ) 100 MG capsule Take 1 capsule (100 mg total) by mouth 2 (two) times daily. 10 capsule 0   phentermine  (ADIPEX-P ) 37.5 MG tablet Take 1 tablet (37.5 mg total) by mouth daily before breakfast. (Patient not taking: Reported on 03/03/2024) 90 tablet 0   rosuvastatin  (CRESTOR ) 10 MG tablet Take 1 tablet 3 nights a week for cholesterol. 45 tablet 3   No current facility-administered medications for this visit.   No results found.  Review of Systems:   A ROS was performed including pertinent positives and negatives as documented in the HPI.  Physical Exam :   Constitutional: NAD and appears stated age Neurological: Alert and oriented Psych: Appropriate affect and cooperative There were no vitals taken for this visit.   Comprehensive Musculoskeletal Exam:    Right shoulder exam demonstrates tenderness over the lateral deltoid.  Active range of motion is to 150 degrees flexion compared to 160 on contralateral side, 70 degrees X rotation, and internal rotation L4.  Positive empty can and O'Brien's test.  Imaging:        Assessment & Plan Right shoulder rotator cuff tendinopathy Patient continues to experience significant right shoulder pain beginning almost 2 weeks ago.  X-rays taken at last visit were suggestive of calcific tendinitis but negative for notable degenerative changes.  Subacromial injection performed  1 week ago gave very little relief.  Based on her presentation of pain, I am still suspicious for rotator cuff tendinopathy as pain is located laterally and worsens with overhead motion.  She does however have a history of adhesive capsulitis so we will monitor for this as well although range of motion is grossly maintained.  She has been performing home exercises as discussed at last visit.  At this point I have recommended proceeding with an MRI for further evaluation, with particular attention toward the rotator cuff.  She has been getting some temporary relief from ibuprofen , so would like to transition her to meloxicam  for the next 2 weeks in order to lessen the amount of medication burden.  Patient is agreeable to plan.  Will have her follow-up in clinic shortly after MRI for review and treatment discussion.     I personally saw and evaluated the patient, and participated in the management and treatment plan.  Leonce Reveal, PA-C Orthopedics

## 2024-06-29 ENCOUNTER — Ambulatory Visit: Admitting: Family Medicine

## 2024-06-29 ENCOUNTER — Encounter: Payer: Self-pay | Admitting: Family Medicine

## 2024-06-29 VITALS — BP 122/66 | HR 80 | Ht 63.0 in | Wt 239.2 lb

## 2024-06-29 DIAGNOSIS — I10 Essential (primary) hypertension: Secondary | ICD-10-CM

## 2024-06-29 DIAGNOSIS — R079 Chest pain, unspecified: Secondary | ICD-10-CM

## 2024-06-29 NOTE — Patient Instructions (Signed)
  I recommend taking prilosec OTC once daily for up to 2 weeks.  This will help protect your stomach from the meloxicam  that was prescribed for your shoulder, and help with any reflux or inflammation of the stomach or esophagus (that may have occurred related to the ibuprofen  use).  If you have ongoing chest symptoms, you may want to continue the prilosec but stop the meloxicam  for a bit.  Try and limit citrus, acidic foods (tomatoes, red sauces).  Try and avoid spicy foods, caffeine, alcohol, over-eating. Try and be sure to wait 2-3 hours after eating before laying down.

## 2024-06-29 NOTE — Progress Notes (Unsigned)
 Chief Complaint  Patient presents with   other    Bloating indigestion in chest and back area, pain in chest that moves, random times at night and during day. Took gas-x and baking soda w/ warm water. Seems to help but comes back afterwards, Hasn't changed diet    She is having pain In her chest, comes and goes. She noticed some today, even though she didn't eat much.  Doesn't have any discomfort currently. It feels worse when she lays down. Denies nausea, belching. Feels like a tightness.  Denies heartburn.  It occurs at rest (laying down).  It moves around, and then eases off. She noticed this for 3 days.   She is being evaluated by ortho for R shoulder pain.  Injection wasn't helpful.  Ibuprofen  helps some, they are switching her to meloxicam  (seen yesterday in follow-up). Plan is for MRI.  Last ibuprofen  was yesterday, hasn't started the meloxicam  yet. She had some discomfort earlier today, which wasn't as bad as the previous days.  She tried Gas-X, as well as baking soda with water.  These helped some.  She denies changes to the diet.  PMH, PSH, SH reviewed   ROS: no f/c, URI symptoms, cough, shortness of breath, palpitation, n/v bowel changes. No bleeding, bruising, rash. No heartburn, belching.     PHYSICAL EXAM:  BP 122/66   Pulse 80   Ht 5' 3 (1.6 m)   Wt 239 lb 3.2 oz (108.5 kg)   LMP  (LMP Unknown) Comment: no period  SpO2 97%   BMI 42.37 kg/m   Wt Readings from Last 3 Encounters:  06/29/24 239 lb 3.2 oz (108.5 kg)  03/03/24 246 lb 9.6 oz (111.9 kg)  02/18/24 242 lb (109.8 kg)   Pleasant, well-appearing female in no distress HEENT: conjunctiva and sclera are clear, EOMI Neck: no lymphadenopathy or mass Heart: regular rate and rhythm, no murmur Lungs: clear bilaterally Chest: nontender Abdomen: mildly tender in upper epigastrium.  No hepatosplenomegaly or mass. Extremities: no edema Psych: normal mood, affect, hygiene and grooming Neuro: alert and  oriented, cranial nerves grossly intact.   EKG: NSR, poss LAE. Tracing somewhat difficult to see (the QRS).  No acute abnl. No significant changes from EKG of 06/2022.   ASSESSMENT/PLAN:   I recommend taking prilosec OTC once daily for up to 2 weeks.  This will help protect your stomach from the meloxicam  that was prescribed for your shoulder, and help with any reflux or inflammation of the stomach or esophagus (that may have occurred related to the ibuprofen  use).  If you have ongoing chest symptoms, you may want to continue the prilosec but stop the meloxicam  for a bit.  Try and limit citrus, acidic foods (tomatoes, red sauces).  Try and avoid spicy foods, caffeine, alcohol, over-eating. Try and be sure to wait 2-3 hours after eating before laying down.

## 2024-06-30 ENCOUNTER — Encounter: Payer: Self-pay | Admitting: Family Medicine

## 2024-07-01 ENCOUNTER — Inpatient Hospital Stay (HOSPITAL_BASED_OUTPATIENT_CLINIC_OR_DEPARTMENT_OTHER): Payer: Self-pay | Admitting: Nurse Practitioner

## 2024-07-01 ENCOUNTER — Encounter: Payer: Self-pay | Admitting: Nurse Practitioner

## 2024-07-01 ENCOUNTER — Ambulatory Visit (HOSPITAL_BASED_OUTPATIENT_CLINIC_OR_DEPARTMENT_OTHER)

## 2024-07-01 ENCOUNTER — Inpatient Hospital Stay: Payer: Self-pay | Attending: Nurse Practitioner

## 2024-07-01 VITALS — BP 111/71 | HR 83 | Temp 97.8°F | Resp 18 | Ht 63.0 in | Wt 237.5 lb

## 2024-07-01 DIAGNOSIS — R319 Hematuria, unspecified: Secondary | ICD-10-CM

## 2024-07-01 DIAGNOSIS — R3129 Other microscopic hematuria: Secondary | ICD-10-CM | POA: Diagnosis not present

## 2024-07-01 DIAGNOSIS — D500 Iron deficiency anemia secondary to blood loss (chronic): Secondary | ICD-10-CM | POA: Diagnosis not present

## 2024-07-01 DIAGNOSIS — D696 Thrombocytopenia, unspecified: Secondary | ICD-10-CM

## 2024-07-01 DIAGNOSIS — D649 Anemia, unspecified: Secondary | ICD-10-CM

## 2024-07-01 LAB — URINALYSIS, COMPLETE (UACMP) WITH MICROSCOPIC
Bacteria, UA: NONE SEEN
Bilirubin Urine: NEGATIVE
Glucose, UA: NEGATIVE mg/dL
Ketones, ur: NEGATIVE mg/dL
Leukocytes,Ua: NEGATIVE
Nitrite: NEGATIVE
RBC / HPF: >50 RBC/hpf (ref 0–5)
Specific Gravity, Urine: 1.031 — ABNORMAL HIGH (ref 1.005–1.030)
pH: 6 (ref 5.0–8.0)

## 2024-07-01 LAB — CBC WITH DIFFERENTIAL (CANCER CENTER ONLY)
Abs Immature Granulocytes: 0.03 K/uL (ref 0.00–0.07)
Basophils Absolute: 0.1 K/uL (ref 0.0–0.1)
Basophils Relative: 1 %
Eosinophils Absolute: 0.1 K/uL (ref 0.0–0.5)
Eosinophils Relative: 1 %
HCT: 35.4 % — ABNORMAL LOW (ref 36.0–46.0)
Hemoglobin: 11.8 g/dL — ABNORMAL LOW (ref 12.0–15.0)
Immature Granulocytes: 0 %
Lymphocytes Relative: 40 %
Lymphs Abs: 3.7 K/uL (ref 0.7–4.0)
MCH: 29.9 pg (ref 26.0–34.0)
MCHC: 33.3 g/dL (ref 30.0–36.0)
MCV: 89.6 fL (ref 80.0–100.0)
Monocytes Absolute: 0.6 K/uL (ref 0.1–1.0)
Monocytes Relative: 6 %
Neutro Abs: 4.8 K/uL (ref 1.7–7.7)
Neutrophils Relative %: 52 %
Platelet Count: 431 K/uL — ABNORMAL HIGH (ref 150–400)
RBC: 3.95 MIL/uL (ref 3.87–5.11)
RDW: 15.2 % (ref 11.5–15.5)
WBC Count: 9.2 K/uL (ref 4.0–10.5)
nRBC: 0 % (ref 0.0–0.2)

## 2024-07-01 LAB — FERRITIN: Ferritin: 91 ng/mL (ref 11–307)

## 2024-07-01 NOTE — Progress Notes (Signed)
  [facility] OFFICE PROGRESS NOTE   Diagnosis: Thrombocytopenia  INTERVAL HISTORY:   [name] returns for follow-up.  Feels well overall.  Continues to have intermittent hematuria.  She wonders if this is due to kidney stones.  She is no longer experiencing postprandial bowel movements.  No blood in stool.  Objective:  Vital signs in last 24 hours:  Blood pressure 111/71, pulse 83, temperature 97.8 F (36.6 C), temperature source Temporal, resp. rate 18, height 5' 3 (1.6 m), weight 237 lb 8 oz (107.7 kg), SpO2 100%.    Resp: Lungs clear bilaterally. Cardio: Regular rate and rhythm. GI: No hepatosplenomegaly.  Soft and nontender. Vascular: No leg edema.   Lab Results:  Lab Results  Component Value Date   WBC 9.2 07/01/2024   HGB 11.8 (L) 07/01/2024   HCT 35.4 (L) 07/01/2024   MCV 89.6 07/01/2024   PLT 431 (H) 07/01/2024   NEUTROABS 4.8 07/01/2024    Imaging:  No results found.  Medications: I have reviewed the patient's current medications.  Assessment/Plan: Thrombocytopenia Mild anemia Microscopic hematuria  History of kidney stones Asthma Postprandial bowel movements-improved 07/01/2024.    Disposition: Ms. [name] appears stable.  We saw her in an initial visit about 6 months ago for evaluation of thrombocytopenia.  The platelet count was normal at that time.  She was noted to be mildly anemic.  Peripheral blood smear with some changes potentially indicating early iron deficiency.  Iron saturation was low, ferritin in low normal range.  CBC from today shows very mild persistent anemia, platelet count mildly elevated.  She continues to have intermittent hematuria.  She will submit a urine specimen today for UA and culture.  She will schedule follow-up with Dr. Roseann, urology.  We provided her with stool cards to complete.  She will return for lab and follow-up in 6 months.  We are available to see her sooner if needed.   Olam Ned  ANP/GNP-BC   07/01/2024  10:32 AM

## 2024-07-02 LAB — URINE CULTURE: Culture: <10000 — AB

## 2024-07-08 ENCOUNTER — Telehealth: Payer: Self-pay

## 2024-07-08 NOTE — Telephone Encounter (Signed)
 Refill request: Epinephrine  0.3 mg  @ [facility] [id number] [place]

## 2024-07-12 ENCOUNTER — Other Ambulatory Visit: Payer: Self-pay | Admitting: Nurse Practitioner

## 2024-07-12 DIAGNOSIS — Z91013 Allergy to seafood: Secondary | ICD-10-CM

## 2024-07-12 MED ORDER — EPINEPHRINE 0.3 MG/0.3ML IJ SOAJ: 0.3000 mg | INTRAMUSCULAR | 0 refills | Status: AC | PRN

## 2024-07-12 NOTE — Telephone Encounter (Signed)
 Copied from CRM [id number]. Topic: Clinical - Medication Refill >> Jul 12, 2024 12:39 PM Charlet HERO wrote: Medication: EPINEPHrine  0.3 mg/0.3 mL IJ SOAJ injection  Has the patient contacted their pharmacy? Yes  Was informed that she would need to call the script in to get refill  This is the patient's preferred pharmacy:  [id number] [address]  [place], [place] [address] [phone]  Is this the correct pharmacy for this prescription? Yes If no, delete pharmacy and type the correct one.   Has the prescription been filled recently? No  Is the patient out of the medication? Yes  Has the patient been seen for an appointment in the last year OR does the patient have an upcoming appointment? Yes 09/13/2024 Can we respond through MyChart? Yes  Agent: Please be advised that Rx refills may take up to 3 business days. We ask that you follow-up with your pharmacy.

## 2024-08-02 ENCOUNTER — Ambulatory Visit: Payer: Self-pay

## 2024-08-02 NOTE — Telephone Encounter (Signed)
 See note

## 2024-08-02 NOTE — Telephone Encounter (Signed)
 FYI Only or Action Required?: FYI only for provider.  Patient was last seen in primary care on 06/29/2024 by Randol Dawes, MD.  Called Nurse Triage reporting Abdominal Pain.  Symptoms began today.  Interventions attempted: OTC medications: Tylenol .  Symptoms are: stable.  Triage Disposition: See Physician Within 24 Hours (overriding See HCP Within 4 Hours (Or PCP Triage))  Patient/caregiver understands and will follow disposition?: Yes                             Copied from CRM [id number]. Topic: Clinical - Red Word Triage >> Aug 02, 2024  2:57 PM Kevelyn M wrote: Red Word that prompted transfer to Nurse Triage: Pain on right side lower abdomen area. Started today. Reason for Disposition  [1] MILD-MODERATE pain AND [2] constant AND [3] present > 2 hours  Answer Assessment - Initial Assessment Questions 1. LOCATION: Where does it hurt?      RLQ 2. RADIATION: Does the pain shoot anywhere else? (e.g., chest, back)     Radiates around same spot  3. ONSET: When did the pain begin? (e.g., minutes, hours or days ago)      Today  4. SUDDEN: Gradual or sudden onset?     Sudden 5. PATTERN Does the pain come and go, or is it constant?     Constant 6. SEVERITY: How bad is the pain?  (e.g., Scale 1-10; mild, moderate, or severe)     Rates pain a 4, states pain is not unbearable 7. RECURRENT SYMPTOM: Have you ever had this type of stomach pain before? If Yes, ask: When was the last time? and What happened that time?      Denies 8. CAUSE: What do you think is causing the stomach pain? (e.g., gallstones, recent abdominal surgery)     Unsure 9. RELIEVING/AGGRAVATING FACTORS: What makes it better or worse? (e.g., antacids, bending or twisting motion, bowel movement)     Denies 10. OTHER SYMPTOMS: Do you have any other symptoms? (e.g., back pain, diarrhea, fever, urination pain, vomiting)     Denies nausea, denies vomiting, denies diarrhea,  denies fever, denies lower back pain, denies urinary symptoms, denies bloating/distention    Scheduled for first available appointment in office (tomorrow morning).  Protocols used: Abdominal Pain - Female-A-AH

## 2024-08-03 ENCOUNTER — Encounter: Payer: Self-pay | Admitting: Family Medicine

## 2024-08-03 ENCOUNTER — Ambulatory Visit: Admitting: Family Medicine

## 2024-08-03 VITALS — BP 124/70 | HR 76 | Ht 63.0 in | Wt 241.0 lb

## 2024-08-03 DIAGNOSIS — Z87442 Personal history of urinary calculi: Secondary | ICD-10-CM

## 2024-08-03 DIAGNOSIS — D500 Iron deficiency anemia secondary to blood loss (chronic): Secondary | ICD-10-CM | POA: Diagnosis not present

## 2024-08-03 DIAGNOSIS — R319 Hematuria, unspecified: Secondary | ICD-10-CM

## 2024-08-03 LAB — POCT URINALYSIS DIP (CLINITEK)
Glucose, UA: NEGATIVE mg/dL
Leukocytes, UA: NEGATIVE
Nitrite, UA: NEGATIVE
POC PROTEIN,UA: NEGATIVE
Spec Grav, UA: 1.02 (ref 1.010–1.025)
Urobilinogen, UA: 0.2 U/dL
pH, UA: 6 (ref 5.0–8.0)

## 2024-08-03 NOTE — Progress Notes (Signed)
   Subjective:    Patient ID: Vanessa Copeland, female    DOB: 09-Jul-1966, 58 y.o.   MRN: [id number]  Discussed the use of AI scribe software for clinical note transcription with the patient, who gave verbal consent to proceed.  History of Present Illness   Vanessa Copeland is a 58 year old female with kidney stones who presents with right lower quadrant pain.  She has been experiencing right lower quadrant pain below the ribs for the past two days. The pain was persistent all day yesterday but has improved today. It is described as radiating and waxing and waning in intensity. No associated symptoms such as nausea, vomiting, bloating, diarrhea, or changes with motion are reported. There are no specific triggers or alleviating factors identified.  She has a history of kidney stones and has experienced severe pain during previous episodes. She has a high tolerance for pain and is uncertain if the current pain is related to a kidney stone. She recalls having blood in her urine during past kidney stone episodes and notes recent hematuria. She reports that a CT scan was ordered as part of her evaluation, but she has not completed it due to insurance issues mainly revolving around co-pay.  She has seen a urologist and was referred to an oncologist for further evaluation of the blood in her urine. An office visit in July for anemia included blood work, but the results are not available in the current record. She mentions having had an ultrasound in the past that showed a couple of stones in her kidneys.           Review of Systems     Objective:    Physical Exam Alert and in no distress.  Abdominal exam shows normal bowel sounds with no tenderness to palpation.  Urinalysis did show red cells.           Assessment & Plan:  Assessment and Plan    Hematuria Under evaluation by urology and hematology. Differential includes nephrolithiasis, kidney or bladder issues, or tumor. CT scan pending due  to insurance issues. Malignancy not ruled out without imaging. - Recommend completing CT scan for kidneys, ureters, and bladder. - Consider cystoscopy if CT scan is inconclusive.  Nephrolithiasis Current symptoms not consistent with acute nephrolithiasis. Previous ultrasound showed stones. Right lower quadrant pain resolved, possibly due to stone movement into bladder. - CT scan to identify stones and other causes of hematuria.  Anemia Anemia noted in July with ongoing hematology evaluation. July blood work showed slightly low hemoglobin.      She does plan to go ahead and get the CT scan done to ensure that there are no other issues.

## 2024-08-03 NOTE — Addendum Note (Signed)
 Addended by: JOYCE NORLEEN BROCKS on: 08/03/2024 12:08 PM   Modules accepted: Orders, Level of Service

## 2024-09-12 NOTE — Progress Notes (Signed)
Last PAP:

## 2024-09-13 ENCOUNTER — Encounter: Payer: Self-pay | Admitting: Nurse Practitioner

## 2024-09-13 ENCOUNTER — Ambulatory Visit: Payer: BC Managed Care – PPO | Admitting: Nurse Practitioner

## 2024-09-13 VITALS — BP 124/82 | HR 80 | Ht 63.0 in | Wt 245.2 lb

## 2024-09-13 DIAGNOSIS — Z23 Encounter for immunization: Secondary | ICD-10-CM | POA: Diagnosis not present

## 2024-09-13 DIAGNOSIS — E559 Vitamin D deficiency, unspecified: Secondary | ICD-10-CM

## 2024-09-13 DIAGNOSIS — K760 Fatty (change of) liver, not elsewhere classified: Secondary | ICD-10-CM | POA: Diagnosis not present

## 2024-09-13 DIAGNOSIS — R7303 Prediabetes: Secondary | ICD-10-CM | POA: Diagnosis not present

## 2024-09-13 DIAGNOSIS — G4733 Obstructive sleep apnea (adult) (pediatric): Secondary | ICD-10-CM

## 2024-09-13 DIAGNOSIS — I10 Essential (primary) hypertension: Secondary | ICD-10-CM

## 2024-09-13 DIAGNOSIS — Z6841 Body Mass Index (BMI) 40.0 and over, adult: Secondary | ICD-10-CM

## 2024-09-13 DIAGNOSIS — Z0000 Encounter for general adult medical examination without abnormal findings: Secondary | ICD-10-CM

## 2024-09-13 DIAGNOSIS — E7849 Other hyperlipidemia: Secondary | ICD-10-CM

## 2024-09-13 DIAGNOSIS — E66813 Obesity, class 3: Secondary | ICD-10-CM

## 2024-09-13 DIAGNOSIS — D500 Iron deficiency anemia secondary to blood loss (chronic): Secondary | ICD-10-CM

## 2024-09-13 LAB — LIPID PANEL

## 2024-09-13 MED ORDER — PHENTERMINE HCL 37.5 MG PO TABS: 37.5000 mg | ORAL_TABLET | Freq: Every day | ORAL | 0 refills | Status: DC

## 2024-09-13 MED ORDER — ZONISAMIDE 100 MG PO CAPS: ORAL_CAPSULE | ORAL | 2 refills | Status: DC

## 2024-09-13 NOTE — Progress Notes (Signed)
 Catheline Doing, DNP, AGNP-c [facility] [address] [place], [place] [address] Main Office [phone] VISIT TYPE: CPE on 09/13/2024 Today's Vitals   09/13/24 0942  BP: 124/82  Pulse: 80  Weight: 245 lb 3.2 oz (111.2 kg)  Height: 5' 3 (1.6 m)   Body mass index is 43.44 kg/m. BP 124/82   Pulse 80   Ht 5' 3 (1.6 m)   Wt 245 lb 3.2 oz (111.2 kg)   LMP  (LMP Unknown) Comment: no period  BMI 43.44 kg/m   Subjective:    Patient ID: Vanessa Copeland, female    DOB: 1965/12/28, 58 y.o.   MRN: [id number]  HPI: History of Present Illness Vanessa Copeland is a 58 year old female who presents for an annual physical exam and to discuss weight loss medications.  She is interested in exploring weight loss medications beyond phentermine  due to insurance coverage issues. Her daughter is using zonisamide  25 mg for appetite suppression, and she inquired about its use for weight loss.  She has a history of sleep apnea and possibly fatty liver disease, though she is unsure about the latter. She is on the state employee's health plan and has experienced challenges with insurance coverage for certain medications.  She uses Flonase  occasionally for nighttime allergy symptoms but does not take regular allergy pills.   No recent allergy symptoms, shortness of breath, chest pain, dizziness, changes in bowel or bladder habits, vaginal bleeding, or swelling in her feet.  She acknowledges being inconsistent with regular breast exams but does undergo mammograms. She has not noticed any new moles or skin changes.  She has been inconsistent with regular exercise since moving in June but plans to start walking as the weather cools down. She believes that once she starts, she will maintain the routine unless disrupted.  Pertinent items are noted in HPI.  Most Recent Depression Screen:     09/13/2024    9:40 AM 07/01/2024   10:32 AM 02/18/2024   10:33 AM 01/12/2024    9:21 AM 09/11/2023     3:23 PM  Depression screen PHQ 2/9  Decreased Interest 0 0 0 0 0  Down, Depressed, Hopeless 0 0 0 0 0  PHQ - 2 Score 0 0 0 0 0   Most Recent Anxiety Screen:     09/12/2019    2:42 PM  GAD 7 : Generalized Anxiety Score  Nervous, Anxious, on Edge 1  Control/stop worrying 1  Worry too much - different things 1  Trouble relaxing 1  Restless 2  Easily annoyed or irritable 2  Afraid - awful might happen 2  Total GAD 7 Score 10  Anxiety Difficulty Not difficult at all   Most Recent Fall Screen:    09/13/2024    9:40 AM 02/18/2024   10:33 AM 01/12/2024    9:21 AM 09/11/2023    3:22 PM 02/02/2020    8:27 AM  Fall Risk   Falls in the past year? 0 0 0 0 0   Number falls in past yr: 0 0 0 0 0  Injury with Fall? 0 0 0 0 0  Risk for fall due to : No Fall Risks No Fall Risks  No Fall Risks   Follow up Falls evaluation completed Falls evaluation completed  Falls evaluation completed      Data saved with a previous flowsheet row definition    Past medical history, surgical history, medications, allergies, family history and social history reviewed with patient  today and changes made to appropriate areas of the chart.  Past Medical History:  Past Medical History:  Diagnosis Date   Allergy    Anemia    Anxiety    Asthma    mild intermittent(when allergies are flaring)   Complication of anesthesia    Food allergy    Shellfish   H/O echocardiogram 12/2005   Trace MR, mild TR, nl L ventricular systolic function.   Heart murmur    since birth. Never has had any problems   Hepatic steatosis    per CT   History of kidney stones    Kidney stones    Microscopic hematuria 01/16/2022   Morbid obesity (HCC) 04/29/2019   Obesity, unspecified    PONV (postoperative nausea and vomiting)    Primary hypertension 01/21/2024   Screening for STD (sexually transmitted disease) 10/21/2016   Vaccination phobia 02/27/2020   Vitamin D  deficiency 01/2010   Medications:  Current Outpatient  Medications on File Prior to Visit  Medication Sig   albuterol  (VENTOLIN  HFA) 108 (90 Base) MCG/ACT inhaler Inhale 2 puffs into the lungs every 6 (six) hours as needed for wheezing or shortness of breath.   EPINEPHrine  0.3 mg/0.3 mL IJ SOAJ injection Inject 0.3 mg into the muscle as needed for anaphylaxis.   fluticasone  (FLONASE ) 50 MCG/ACT nasal spray SPRAY 2 SPRAYS INTO EACH NOSTRIL EVERY DAY   hydrochlorothiazide  (HYDRODIURIL ) 25 MG tablet Take 1 tablet (25 mg total) by mouth daily.   ibuprofen  (ADVIL ) 800 MG tablet TAKE 1 TABLET (800 MG TOTAL) BY MOUTH EVERY 8 (EIGHT) HOURS AS NEEDED FOR JAW PAIN   rosuvastatin  (CRESTOR ) 10 MG tablet Take 1 tablet 3 nights a week for cholesterol.   No current facility-administered medications on file prior to visit.   Surgical History:  Past Surgical History:  Procedure Laterality Date   CESAREAN SECTION     x3   COLONOSCOPY     CYSTOSCOPY WITH RETROGRADE PYELOGRAM, URETEROSCOPY AND STENT PLACEMENT Left 07/16/2018   Procedure: CYSTOSCOPY WITH RETROGRADE PYELOGRAM, URETEROSCOPY AND STENT PLACEMENT;  Surgeon: Sherrilee Belvie CROME, MD;  Location: [facility];  Service: Urology;  Laterality: Left;   EXTRACORPOREAL SHOCK WAVE LITHOTRIPSY Left 06/21/2018   Procedure: LEFT EXTRACORPOREAL SHOCK WAVE LITHOTRIPSY (ESWL);  Surgeon: Matilda Senior, MD;  Location: [facility];  Service: Urology;  Laterality: Left;   HOLMIUM LASER APPLICATION Left 07/16/2018   Procedure: HOLMIUM LASER APPLICATION;  Surgeon: Sherrilee Belvie CROME, MD;  Location: [facility];  Service: Urology;  Laterality: Left;   LUMBAR DISC SURGERY  2019   OOPHORECTOMY  2002   left due to cyst   POLYPECTOMY     TUBAL LIGATION  1996   Allergies:  Allergies  Allergen Reactions   Shellfish Allergy Shortness Of Breath   Family History:  Family History  Problem Relation Age of Onset   Hypertension Mother    Anemia Mother    Colon polyps Mother    Anxiety disorder Mother    Cancer Father        lung    Kidney Stones Father    Hypertension Father    Lung cancer Father 63   Asthma Brother    Asthma Daughter    Allergies Daughter    Asthma Daughter    Hypertension Brother    Breast cancer Neg Hx    Colon cancer Neg Hx    Diabetes Neg Hx    Heart disease Neg Hx    Esophageal cancer Neg Hx  Rectal cancer Neg Hx    Stomach cancer Neg Hx        Objective:    BP 124/82   Pulse 80   Ht 5' 3 (1.6 m)   Wt 245 lb 3.2 oz (111.2 kg)   LMP  (LMP Unknown) Comment: no period  BMI 43.44 kg/m   Wt Readings from Last 3 Encounters:  09/13/24 245 lb 3.2 oz (111.2 kg)  08/03/24 241 lb (109.3 kg)  07/01/24 237 lb 8 oz (107.7 kg)    Physical Exam Vitals and nursing note reviewed.  Constitutional:      General: She is not in acute distress.    Appearance: Normal appearance. She is obese. She is not ill-appearing.  HENT:     Head: Normocephalic and atraumatic.     Right Ear: Hearing, ear canal and external ear normal.     Left Ear: Hearing, tympanic membrane, ear canal and external ear normal.     Ears:     Comments: Serous fluid present in right TM    Nose: Nose normal.     Right Sinus: No maxillary sinus tenderness or frontal sinus tenderness.     Left Sinus: No maxillary sinus tenderness or frontal sinus tenderness.     Mouth/Throat:     Lips: Pink.     Mouth: Mucous membranes are moist.     Pharynx: Oropharynx is clear.  Eyes:     General: Lids are normal. Vision grossly intact.     Extraocular Movements: Extraocular movements intact.     Conjunctiva/sclera: Conjunctivae normal.     Pupils: Pupils are equal, round, and reactive to light.     Funduscopic exam:    Right eye: Red reflex present.        Left eye: Red reflex present.    Visual Fields: Right eye visual fields normal and left eye visual fields normal.  Neck:     Thyroid : No thyromegaly.     Vascular: No carotid bruit.  Cardiovascular:     Rate and Rhythm: Normal rate and regular rhythm.     Chest Wall: PMI is  not displaced.     Pulses: Normal pulses.          Dorsalis pedis pulses are 2+ on the right side and 2+ on the left side.       Posterior tibial pulses are 2+ on the right side and 2+ on the left side.     Heart sounds: Normal heart sounds. No murmur heard. Pulmonary:     Effort: Pulmonary effort is normal. No respiratory distress.     Breath sounds: Normal breath sounds.  Abdominal:     General: Abdomen is flat. Bowel sounds are normal. There is no distension.     Palpations: Abdomen is soft. There is no hepatomegaly, splenomegaly or mass.     Tenderness: There is no abdominal tenderness. There is no right CVA tenderness, left CVA tenderness, guarding or rebound.  Musculoskeletal:        General: Normal range of motion.     Cervical back: Full passive range of motion without pain, normal range of motion and neck supple. No tenderness.     Right lower leg: No edema.     Left lower leg: No edema.  Feet:     Left foot:     Toenail Condition: Left toenails are normal.  Lymphadenopathy:     Cervical: No cervical adenopathy.     Upper Body:     Right  upper body: No supraclavicular adenopathy.     Left upper body: No supraclavicular adenopathy.  Skin:    General: Skin is warm and dry.     Capillary Refill: Capillary refill takes less than 2 seconds.     Nails: There is no clubbing.  Neurological:     General: No focal deficit present.     Mental Status: She is alert and oriented to person, place, and time.     GCS: GCS eye subscore is 4. GCS verbal subscore is 5. GCS motor subscore is 6.     Sensory: Sensation is intact.     Motor: Motor function is intact.     Coordination: Coordination is intact.     Gait: Gait is intact.     Deep Tendon Reflexes: Reflexes are normal and symmetric.  Psychiatric:        Attention and Perception: Attention normal.        Mood and Affect: Mood normal.        Speech: Speech normal.        Behavior: Behavior normal. Behavior is cooperative.         Thought Content: Thought content normal.        Cognition and Memory: Cognition and memory normal.        Judgment: Judgment normal.     Results for orders placed or performed in visit on 09/13/24  CBC with Differential/Platelet   Collection Time: 09/13/24 10:46 AM  Result Value Ref Range   WBC 7.5 3.4 - 10.8 x10E3/uL   RBC 3.99 3.77 - 5.28 x10E6/uL   Hemoglobin 11.5 11.1 - 15.9 g/dL   Hematocrit 63.3 65.9 - 46.6 %   MCV 92 79 - 97 fL   MCH 28.8 26.6 - 33.0 pg   MCHC 31.4 (L) 31.5 - 35.7 g/dL   RDW 85.2 88.2 - 84.5 %   Platelets 163 150 - 450 x10E3/uL   Neutrophils 46 Not Estab. %   Lymphs 44 Not Estab. %   Monocytes 7 Not Estab. %   Eos 2 Not Estab. %   Basos 1 Not Estab. %   Neutrophils Absolute 3.4 1.4 - 7.0 x10E3/uL   Lymphocytes Absolute 3.3 (H) 0.7 - 3.1 x10E3/uL   Monocytes Absolute 0.5 0.1 - 0.9 x10E3/uL   EOS (ABSOLUTE) 0.2 0.0 - 0.4 x10E3/uL   Basophils Absolute 0.0 0.0 - 0.2 x10E3/uL   Immature Granulocytes 0 Not Estab. %   Immature Grans (Abs) 0.0 0.0 - 0.1 x10E3/uL  CMP14+EGFR   Collection Time: 09/13/24 10:46 AM  Result Value Ref Range   Glucose 86 70 - 99 mg/dL   BUN 17 6 - 24 mg/dL   Creatinine, Ser 9.13 0.57 - 1.00 mg/dL   eGFR 79 >40 fO/fpw/8.26   BUN/Creatinine Ratio 20 9 - 23   Sodium 139 134 - 144 mmol/L   Potassium 4.2 3.5 - 5.2 mmol/L   Chloride 98 96 - 106 mmol/L   CO2 25 20 - 29 mmol/L   Calcium  9.9 8.7 - 10.2 mg/dL   Total Protein 7.3 6.0 - 8.5 g/dL   Albumin 4.5 3.8 - 4.9 g/dL   Globulin, Total 2.8 1.5 - 4.5 g/dL   Bilirubin Total 0.5 0.0 - 1.2 mg/dL   Alkaline Phosphatase 134 49 - 135 IU/L   AST 21 0 - 40 IU/L   ALT 33 (H) 0 - 32 IU/L  Hemoglobin A1c   Collection Time: 09/13/24 10:46 AM  Result Value Ref Range   Hgb  A1c MFr Bld 6.0 (H) 4.8 - 5.6 %   Est. average glucose Bld gHb Est-mCnc 126 mg/dL  Lipid panel   Collection Time: 09/13/24 10:46 AM  Result Value Ref Range   Cholesterol, Total 166 100 - 199 mg/dL   Triglycerides 82 0  - 149 mg/dL   HDL 56 >60 mg/dL   VLDL Cholesterol Cal 15 5 - 40 mg/dL   LDL Chol Calc (NIH) 95 0 - 99 mg/dL   Chol/HDL Ratio 3.0 0.0 - 4.4 ratio  VITAMIN D  25 Hydroxy (Vit-D Deficiency, Fractures)   Collection Time: 09/13/24 10:46 AM  Result Value Ref Range   Vit D, 25-Hydroxy 31.9 30.0 - 100.0 ng/mL       Assessment & Plan:   Problem List Items Addressed This Visit     Vitamin D  deficiency   Repeat labs today for monitoring.       Relevant Orders   CMP14+EGFR (Completed)   VITAMIN D  25 Hydroxy (Vit-D Deficiency, Fractures) (Completed)   Encounter for annual physical exam   CPE completed today. Review of HM activities and recommendations discussed and provided on AVS. Anticipatory guidance, diet, and exercise recommendations provided. Medications, allergies, and hx reviewed and updated as necessary. Orders placed as listed below.  Plan: - Labs ordered. Will make changes as necessary based on results.  - I will review these results and send recommendations via MyChart or a telephone call.  - F/U with CPE in 1 year or sooner for acute/chronic health needs as directed.        Prediabetes   Currently working on diet and exercise management. Previous use of semaglutide  was very effective, but no longer covered by insurance. Will trial zonasamide today.       Relevant Medications   zonisamide  (ZONEGRAN ) 100 MG capsule   phentermine  (ADIPEX-P ) 37.5 MG tablet   Other Relevant Orders   CBC with Differential/Platelet (Completed)   CMP14+EGFR (Completed)   Hemoglobin A1c (Completed)   Lipid panel (Completed)   NAFLD (nonalcoholic fatty liver disease)   Noted on previous CT. Recommend diet and exercise modifications for optimal management. Currently managing lipids with statin therapy.       Relevant Medications   zonisamide  (ZONEGRAN ) 100 MG capsule   phentermine  (ADIPEX-P ) 37.5 MG tablet   Other Relevant Orders   CBC with Differential/Platelet (Completed)   CMP14+EGFR  (Completed)   Hemoglobin A1c (Completed)   Lipid panel (Completed)   Iron deficiency anemia due to chronic blood loss   Repeat labs today for monitoring. Suspect loss is due to microscopic hematuria that has been noted without causation.       Hyperlipidemia, pure   Currently managed with rosuvastatin . No concerns present at this time. Dosing is every other day for reduced risk of myopathy. Will plan to continue current regimen with diet and exercise management.       Relevant Medications   zonisamide  (ZONEGRAN ) 100 MG capsule   phentermine  (ADIPEX-P ) 37.5 MG tablet   Other Relevant Orders   CBC with Differential/Platelet (Completed)   CMP14+EGFR (Completed)   Hemoglobin A1c (Completed)   Lipid panel (Completed)   Obstructive sleep apnea of adult   Chronic. Working on weight loss for improved management. Initiation of zonasemide today to see if this is helpful.       Relevant Medications   zonisamide  (ZONEGRAN ) 100 MG capsule   phentermine  (ADIPEX-P ) 37.5 MG tablet   Other Relevant Orders   CBC with Differential/Platelet (Completed)   CMP14+EGFR (Completed)  Hemoglobin A1c (Completed)   Lipid panel (Completed)   Class 3 severe obesity with serious comorbidity and body mass index (BMI) of 45.0 to 49.9 in adult   Obesity management discussed with interest in weight loss medications. Zonisamide  considered for its appetite-reducing side effect, with potential weight loss of approximately 7.3 kg (15 lbs) when combined with diet and lifestyle modifications. Potential combination with phentermine  for enhanced effect discussed. Insurance coverage for weight loss medications is limited, but zonisamide  is relatively inexpensive at approximately $6. - Prescribe zonisamide  - Prescribe phentermine  - Attempt prior authorization for weight loss medication coverage - Encourage lifestyle modifications including diet and exercise       Relevant Medications   zonisamide  (ZONEGRAN ) 100 MG  capsule   phentermine  (ADIPEX-P ) 37.5 MG tablet   Other Relevant Orders   CBC with Differential/Platelet (Completed)   CMP14+EGFR (Completed)   Hemoglobin A1c (Completed)   Lipid panel (Completed)   Primary hypertension   Blood pressure well controlled at this time with no alarm symptoms. Plan to continue current regimen with diet and exercise modifications.       Relevant Medications   zonisamide  (ZONEGRAN ) 100 MG capsule   phentermine  (ADIPEX-P ) 37.5 MG tablet   Other Relevant Orders   CBC with Differential/Platelet (Completed)   CMP14+EGFR (Completed)   Hemoglobin A1c (Completed)   Lipid panel (Completed)   Morbid obesity (HCC)   Relevant Medications   zonisamide  (ZONEGRAN ) 100 MG capsule   phentermine  (ADIPEX-P ) 37.5 MG tablet   Other Relevant Orders   CBC with Differential/Platelet (Completed)   CMP14+EGFR (Completed)   Hemoglobin A1c (Completed)   Lipid panel (Completed)   Other Visit Diagnoses       Need for influenza vaccination    -  Primary   Relevant Orders   Flu vaccine trivalent PF, 6mos and older(Flulaval,Afluria,Fluarix,Fluzone) (Completed)        Follow up plan: Return in about 3 months (around 12/13/2024) for Med Management 30- weight - virtual OK if desired.  NEXT PREVENTATIVE PHYSICAL DUE IN 1 YEAR.  PATIENT COUNSELING PROVIDED FOR ALL ADULT PATIENTS: A well balanced diet low in saturated fats, cholesterol, and moderation in carbohydrates.  This can be as simple as monitoring portion sizes and cutting back on sugary beverages such as soda and juice to start with.    Daily water consumption of at least 64 ounces.  Physical activity at least 180 minutes per week.  If just starting out, start 10 minutes a day and work your way up.   This can be as simple as taking the stairs instead of the elevator and walking 2-3 laps around the office  purposefully every day.   STD protection, partner selection, and regular testing if high risk.  Limited  consumption of alcoholic beverages if alcohol is consumed. For men, I recommend no more than 14 alcoholic beverages per week, spread out throughout the week (max 2 per day). Avoid binge drinking or consuming large quantities of alcohol in one setting.  Please let me know if you feel you may need help with reduction or quitting alcohol consumption.   Avoidance of nicotine, if used. Please let me know if you feel you may need help with reduction or quitting nicotine use.   Daily mental health attention. This can be in the form of 5 minute daily meditation, prayer, journaling, yoga, reflection, etc.  Purposeful attention to your emotions and mental state can significantly improve your overall wellbeing  and  Health.  Please know that I  am here to help you with all of your health care goals and am happy to work with you to find a solution that works best for you.  The greatest advice I have received with any changes in life are to take it one step at a time, that even means if all you can focus on is the next 60 seconds, then do that and celebrate your victories.  With any changes in life, you will have set backs, and that is OK. The important thing to remember is, if you have a set back, it is not a failure, it is an opportunity to try again! Screening Testing Mammogram Every 1 -2 years based on history and risk factors Starting at age 59 Pap Smear Ages 21-39 every 3 years Ages 41-65 every 5 years with HPV testing More frequent testing may be required based on results and history Colon Cancer Screening Every 1-10 years based on test performed, risk factors, and history Starting at age 77 Bone Density Screening Every 2-10 years based on history Starting at age 38 for women Recommendations for men differ based on medication usage, history, and risk factors AAA Screening One time ultrasound Men 82-45 years old who have every smoked Lung Cancer Screening Low Dose Lung CT every 12  months Age 30-80 years with a 30 pack-year smoking history who still smoke or who have quit within the last 15 years   Screening Labs Routine  Labs: Complete Blood Count (CBC), Complete Metabolic Panel (CMP), Cholesterol (Lipid Panel) Every 6-12 months based on history and medications May be recommended more frequently based on current conditions or previous results Hemoglobin A1c Lab Every 3-12 months based on history and previous results Starting at age 13 or earlier with diagnosis of diabetes, high cholesterol, BMI >26, and/or risk factors Frequent monitoring for patients with diabetes to ensure blood sugar control Thyroid  Panel (TSH) Every 6 months based on history, symptoms, and risk factors May be repeated more often if on medication HIV One time testing for all patients 42 and older May be repeated more frequently for patients with increased risk factors or exposure Hepatitis C One time testing for all patients 44 and older May be repeated more frequently for patients with increased risk factors or exposure Gonorrhea, Chlamydia Every 12 months for all sexually active persons 13-24 years Additional monitoring may be recommended for those who are considered high risk or who have symptoms Every 12 months for any woman on birth control, regardless of sexual activity PSA Men 20-44 years old with risk factors Additional screening may be recommended from age 21-69 based on risk factors, symptoms, and history  Vaccine Recommendations Tetanus Booster All adults every 10 years Flu Vaccine All patients 6 months and older every year COVID Vaccine All patients 12 years and older Initial dosing with booster May recommend additional booster based on age and health history HPV Vaccine 2 doses all patients age 37-26 Dosing may be considered for patients over 26 Shingles Vaccine (Shingrix) 2 doses all adults 55 years and older Pneumonia (Pneumovax 74) All adults 65 years and  older May recommend earlier dosing based on health history One year apart from Prevnar 9 Pneumonia (Prevnar 85) All adults 65 years and older Dosed 1 year after Pneumovax 23 Pneumonia (Prevnar 20) One time alternative to the two dosing of 13 and 23 For all adults with initial dose of 23, 20 is recommended 1 year later For all adults with initial dose of 13, 23 is still recommended  as second option 1 year later

## 2024-09-13 NOTE — Patient Instructions (Addendum)
 I have sent in the Zonisamide  for you to start. Start with 2 tablets once a day for 2 weeks then increase to 2 tablets twice a day (total 400mg  daily). Once you get to that level, you can add in the phentermine  to see if this helps together.   Keep working on increasing your activity levels. We will see if we can get a PA for the injectables again. They may cover this for sleep apnea, but it is a long shot.   WEIGHT LOSS PLANNING  For best management of weight, it is vital to balance intake versus output. This means the number of calories burned per day must be less than the calories you take in with food and drink.   I recommend trying to follow a diet with the following: Calories: 1200-1500 calories per day Carbohydrates: 150-180 grams of carbohydrates per day  Why: Gives your body enough quick fuel for cells to maintain normal function without sending them into starvation mode.  Protein: At least 90 grams of protein per day- 30 grams with each meal Why: Protein takes longer and uses more energy than carbohydrates to break down for fuel. The carbohydrates in your meals serves as quick energy sources and proteins help use some of that extra quick energy to break down to produce long term energy. This helps you not feel hungry as quickly and protein breakdown burns calories.  Water: Drink AT LEAST 64 ounces of water per day  Why: Water is essential to healthy metabolism. Water helps to fill the stomach and keep you fuller longer. Water is required for healthy digestion and filtering of waste in the body.  Fat: Limit fats in your diet- when choosing fats, choose foods with lower fats content such as lean meats (chicken, fish, [place]).  Why: Increased fat intake leads to storage for later. Once you burn your carbohydrate energy, your body goes into fat and protein breakdown mode to help you loose weight.  Cholesterol: Fats and oils that are LIQUID at room temperature are best. Choose vegetable  oils (olive oil, avocado oil, nuts). Avoid fats that are SOLID at room temperature (animal fats, processed meats). Healthy fats are often found in whole grains, beans, nuts, seeds, and berries.  Why: Elevated cholesterol levels lead to build up of cholesterol on the inside of your blood vessels. This will eventually cause the blood vessels to become hard and can lead to high blood pressure and damage to your organs. When the blood flow is reduced, but the pressure is high from cholesterol buildup, parts of the cholesterol can break off and form clots that can go to the brain or heart leading to a stroke or heart attack.  Fiber: Increase amount of SOLUBLE the fiber in your diet. This helps to fill you up, lowers cholesterol, and helps with digestion. Some foods high in soluble fiber are oats, peas, beans, apples, carrots, barley, and citrus fruits.   Why: Fiber fills you up, helps remove excess cholesterol, and aids in healthy digestion which are all very important in weight management.   I recommend the following as a minimum activity routine: Purposeful walk or other physical activity at least 20 minutes every single day. This means purposefully taking a walk, jog, bike, swim, treadmill, elliptical, dance, etc.  This activity should be ABOVE your normal daily activities, such as walking at work. Goal exercise should be at least 150 minutes a week- work your way up to this.   Heart Rate: Your maximum exercise  heart rate should be 220 - Your Age in Years. When exercising, get your heart rate up, but avoid going over the maximum targeted heart rate.  60-70% of your maximum heart rate is where you tend to burn the most fat. To find this number:  220 - Age In Years= Max HR  Max HR x 0.6 (or 0.7) = Fat Burning HR The Fat Burning HR is your goal heart rate while working out to burn the most fat.  NEVER exercise to the point your feel lightheaded, weak, nauseated, dizzy. If you experience ANY of these  symptoms- STOP exercise! Allow yourself to cool down and your heart rate to come down. Then restart slower next time.  If at ANY TIME you feel chest pain or chest pressure during exercise, STOP IMMEDIATELY and seek medical attention.

## 2024-09-14 ENCOUNTER — Encounter: Payer: Self-pay | Admitting: Nurse Practitioner

## 2024-09-14 LAB — CBC WITH DIFFERENTIAL/PLATELET
Basophils Absolute: 0 x10E3/uL (ref 0.0–0.2)
Basos: 1 %
EOS (ABSOLUTE): 0.2 x10E3/uL (ref 0.0–0.4)
Eos: 2 %
Hematocrit: 36.6 % (ref 34.0–46.6)
Hemoglobin: 11.5 g/dL (ref 11.1–15.9)
Immature Grans (Abs): 0 x10E3/uL (ref 0.0–0.1)
Immature Granulocytes: 0 %
Lymphocytes Absolute: 3.3 x10E3/uL — ABNORMAL HIGH (ref 0.7–3.1)
Lymphs: 44 %
MCH: 28.8 pg (ref 26.6–33.0)
MCHC: 31.4 g/dL — ABNORMAL LOW (ref 31.5–35.7)
MCV: 92 fL (ref 79–97)
Monocytes Absolute: 0.5 x10E3/uL (ref 0.1–0.9)
Monocytes: 7 %
Neutrophils Absolute: 3.4 x10E3/uL (ref 1.4–7.0)
Neutrophils: 46 %
Platelets: 163 x10E3/uL (ref 150–450)
RBC: 3.99 x10E6/uL (ref 3.77–5.28)
RDW: 14.7 % (ref 11.7–15.4)
WBC: 7.5 x10E3/uL (ref 3.4–10.8)

## 2024-09-14 LAB — LIPID PANEL
Cholesterol, Total: 166 mg/dL (ref 100–199)
HDL: 56 mg/dL (ref >39)
LDL CALC COMMENT:: 3 ratio (ref 0.0–4.4)
LDL Chol Calc (NIH): 95 mg/dL (ref 0–99)
Triglycerides: 82 mg/dL (ref 0–149)
VLDL Cholesterol Cal: 15 mg/dL (ref 5–40)

## 2024-09-14 LAB — CMP14+EGFR
ALT: 33 IU/L — AB (ref 0–32)
AST: 21 IU/L (ref 0–40)
Albumin: 4.5 g/dL (ref 3.8–4.9)
Alkaline Phosphatase: 134 IU/L (ref 49–135)
BUN/Creatinine Ratio: 20 (ref 9–23)
BUN: 17 mg/dL (ref 6–24)
Bilirubin Total: 0.5 mg/dL (ref 0.0–1.2)
CO2: 25 mmol/L (ref 20–29)
Calcium: 9.9 mg/dL (ref 8.7–10.2)
Chloride: 98 mmol/L (ref 96–106)
Creatinine, Ser: 0.86 mg/dL (ref 0.57–1.00)
Globulin, Total: 2.8 g/dL (ref 1.5–4.5)
Glucose: 86 mg/dL (ref 70–99)
Potassium: 4.2 mmol/L (ref 3.5–5.2)
Sodium: 139 mmol/L (ref 134–144)
Total Protein: 7.3 g/dL (ref 6.0–8.5)
eGFR: 79 mL/min/1.73 (ref >59)

## 2024-09-14 LAB — HEMOGLOBIN A1C
Est. average glucose Bld gHb Est-mCnc: 126 mg/dL
Hgb A1c MFr Bld: 6 % — ABNORMAL HIGH (ref 4.8–5.6)

## 2024-09-14 LAB — VITAMIN D 25 HYDROXY (VIT D DEFICIENCY, FRACTURES): Vit D, 25-Hydroxy: 31.9 ng/mL (ref 30.0–100.0)

## 2024-09-15 ENCOUNTER — Telehealth: Payer: Self-pay

## 2024-09-15 ENCOUNTER — Other Ambulatory Visit (HOSPITAL_COMMUNITY): Payer: Self-pay

## 2024-09-15 NOTE — Telephone Encounter (Signed)
 Hello,                     Please note that Our Team has received a Prior Authorization request for Rx Phentermine  HCl 37.5MG  tablets. However Insurance will not cover more than a 3 month supply in a years time, which she last filled via her insurance back in 01/2024 for a 90 day. The patient can still get this Rx by paying a cash price at her pharmacy which typically runs between 15.00-30.00$ if she chooses to still be on this Medication.

## 2024-09-19 ENCOUNTER — Ambulatory Visit: Payer: Self-pay | Admitting: Nurse Practitioner

## 2024-09-20 NOTE — Assessment & Plan Note (Signed)
 Currently working on diet and exercise management. Previous use of semaglutide  was very effective, but no longer covered by insurance. Will trial zonasamide today.

## 2024-09-20 NOTE — Assessment & Plan Note (Signed)
 Currently managed with rosuvastatin . No concerns present at this time. Dosing is every other day for reduced risk of myopathy. Will plan to continue current regimen with diet and exercise management.

## 2024-09-20 NOTE — Assessment & Plan Note (Signed)
 Chronic. Working on weight loss for improved management. Initiation of zonasemide today to see if this is helpful.

## 2024-09-20 NOTE — Assessment & Plan Note (Signed)
 Noted on previous CT. Recommend diet and exercise modifications for optimal management. Currently managing lipids with statin therapy.

## 2024-09-20 NOTE — Assessment & Plan Note (Signed)
 Blood pressure well controlled at this time with no alarm symptoms. Plan to continue current regimen with diet and exercise modifications.

## 2024-09-20 NOTE — Assessment & Plan Note (Signed)

## 2024-09-20 NOTE — Assessment & Plan Note (Signed)
 Repeat labs today for monitoring.

## 2024-09-20 NOTE — Assessment & Plan Note (Signed)
 Repeat labs today for monitoring. Suspect loss is due to microscopic hematuria that has been noted without causation.

## 2024-09-20 NOTE — Assessment & Plan Note (Signed)
 Obesity management discussed with interest in weight loss medications. Zonisamide  considered for its appetite-reducing side effect, with potential weight loss of approximately 7.3 kg (15 lbs) when combined with diet and lifestyle modifications. Potential combination with phentermine  for enhanced effect discussed. Insurance coverage for weight loss medications is limited, but zonisamide  is relatively inexpensive at approximately $6. - Prescribe zonisamide  - Prescribe phentermine  - Attempt prior authorization for weight loss medication coverage - Encourage lifestyle modifications including diet and exercise

## 2024-10-24 ENCOUNTER — Encounter: Payer: Self-pay | Admitting: Radiology

## 2024-10-31 ENCOUNTER — Ambulatory Visit: Admitting: Medical

## 2024-10-31 VITALS — BP 110/70 | HR 75 | Temp 97.4°F | Wt 240.6 lb

## 2024-10-31 DIAGNOSIS — F430 Acute stress reaction: Secondary | ICD-10-CM | POA: Diagnosis not present

## 2024-10-31 DIAGNOSIS — R002 Palpitations: Secondary | ICD-10-CM

## 2024-10-31 DIAGNOSIS — R0683 Snoring: Secondary | ICD-10-CM

## 2024-10-31 NOTE — Progress Notes (Signed)
 Subjective:  Vanessa Copeland is a 58 y.o. female who presents for Chief Complaint  Patient presents with   Acute Visit    Heart fluttering for a couple days. Recently took ibuprofen  and does notice that when she takes this, it makes her heart feel funny. She had similar symptoms back in July and was evaluated by Dr. Randol Lynnetta VEAR Copeland is a 58 year old female with hypertension and hyperlipidemia who presents with palpitations.  She experiences a fluttering sensation in her chest, described as a 'fluttering, beating feeling,' which began two days ago after taking 800 mg of ibuprofen  for back pain. She took one dose in the morning and one in the evening on both Saturday and Sunday. She has experienced this sensation in the past, but it resolved when she stopped taking ibuprofen . The palpitations were felt throughout the night after she layed down to sleep.  She has been under significant stress related to family issues, particularly concerning her mother. No recent alcohol or drug use. She consumes caffeine regularly, drinking two to three bottles of Pepsi Zero and hot tea in the morning.  Her current medications include hydrochlorothiazide  for blood pressure, Crestor  for cholesterol, and an albuterol  inhaler, which she used once last week. She is not currently taking phentermine , but has been on this in the past. She has a known allergy to shellfish.  Her recent blood work in September showed normal electrolytes and blood counts. She had an EKG in July and a CT coronary test in March, which showed no significant coronary issues but some cholesterol in the aorta. An echocardiogram in 2021 was also unremarkable. She has not been evaluated for sleep apnea but acknowledges that she might snore. Her thyroid  function was last checked in January and was normal.  No other aggravating or relieving factors.    No other c/o.  Past Medical History:  Diagnosis Date   Allergy    Anemia    Anxiety     Asthma    mild intermittent(when allergies are flaring)   Complication of anesthesia    Food allergy    Shellfish   H/O echocardiogram 12/2005   Trace MR, mild TR, nl L ventricular systolic function.   Heart murmur    since birth. 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 18 g 6   EPINEPHrine  0.3 mg/0.3 mL IJ SOAJ injection Inject 0.3 mg into the muscle as needed for anaphylaxis. 1 each 0   fluticasone  (FLONASE ) 50 MCG/ACT nasal spray SPRAY 2 SPRAYS INTO EACH NOSTRIL EVERY DAY 48 mL 1   hydrochlorothiazide  (HYDRODIURIL ) 25 MG tablet Take 1 tablet (25 mg total) by mouth daily. 90 tablet 3   ibuprofen  (ADVIL ) 800 MG tablet TAKE 1 TABLET (800 MG TOTAL) BY MOUTH EVERY 8 (EIGHT) HOURS AS NEEDED FOR JAW PAIN 60 tablet  3   rosuvastatin  (CRESTOR ) 10 MG tablet Take 1 tablet 3 nights a week for cholesterol. 45 tablet 3   No current facility-administered medications on file prior to visit.     The following portions of the patient's history were reviewed and updated as appropriate: allergies, current medications, past family history, past medical history, past social history, past surgical history and problem list.  ROS Otherwise as in subjective above  Objective: BP 110/70   Pulse 75   Temp (!) 97.4 F (36.3 C)   Wt 240 lb 9.6 oz (109.1 kg)   LMP  (LMP Unknown) Comment: no period  SpO2 99%    BMI 42.62 kg/m   Wt Readings from Last 3 Encounters:  10/31/24 240 lb 9.6 oz (109.1 kg)  09/13/24 245 lb 3.2 oz (111.2 kg)  08/03/24 241 lb (109.3 kg)   BP Readings from Last 3 Encounters:  10/31/24 110/70  09/13/24 124/82  08/03/24 124/70    General appearance: alert, no distress, well developed, well nourished Neck: supple, no lymphadenopathy, no thyromegaly, no masses Heart: RRR, normal S1, S2, no murmurs Lungs: CTA bilaterally, no wheezes, rhonchi, or rales Pulses: 2+ radial pulses, 2+ pedal pulses, normal cap refill Ext: no edema Psych: pleasant, good eye contact, answers questions appropriately  EKG reviewed, unremarkable   Assessment: Encounter Diagnoses  Name Primary?   Palpitation Yes   Snoring    Acute stress reaction      Plan: We discussed possible causes of palpitations, possible evaluation.  We discussed differential diagnosis which could include anemia, electrolyte disturbance, sleep apnea, cardiac issues, arrhythmia, stimulants, too much caffeine, stress and anxiety.  EKG from July 2025 showed possible left atrial enlargement but otherwise normal.  I reviewed labs from September 13, 2024.  No significant abnormalities of blood counts or electrolytes.  She is prediabetic.  Last thyroid  labs were normal in January 2025  Echocardiogram from January 2021 shows LVEF 55 to 60%, no LVH, mild dilated left ventricle, there was some inferior basal hypokinesis.  Otherwise unremarkable echo  We discussed possible causes of palpitations.  Reassured no recent anemia or electrolyte disturbance.  We discussed that caffeine and other stimulants can cause palpitations.  She can try cutting back on caffeine.  We discussed considering counseling as she does report some increase stress of late and discussed stress reduction in general  We discussed that undiagnosed sleep apnea can also be a trigger.  Gave the option of sleep evaluation.  She does snore but no other witnessed  apnea or other specific symptoms   Vanessa Copeland was seen today for acute visit.  Diagnoses and all orders for this visit:  Palpitation -     EKG 12-Lead  Snoring  Acute stress reaction    Follow up: pending call back

## 2024-12-16 ENCOUNTER — Telehealth: Payer: Self-pay | Admitting: Nurse Practitioner

## 2024-12-16 ENCOUNTER — Encounter: Payer: Self-pay | Admitting: Nurse Practitioner

## 2024-12-16 VITALS — BP 127/83 | Wt 237.0 lb

## 2024-12-16 DIAGNOSIS — I10 Essential (primary) hypertension: Secondary | ICD-10-CM | POA: Diagnosis not present

## 2024-12-16 DIAGNOSIS — Z6841 Body Mass Index (BMI) 40.0 and over, adult: Secondary | ICD-10-CM | POA: Diagnosis not present

## 2024-12-16 DIAGNOSIS — E7849 Other hyperlipidemia: Secondary | ICD-10-CM | POA: Diagnosis not present

## 2024-12-16 DIAGNOSIS — E66813 Obesity, class 3: Secondary | ICD-10-CM | POA: Diagnosis not present

## 2024-12-16 DIAGNOSIS — G4733 Obstructive sleep apnea (adult) (pediatric): Secondary | ICD-10-CM

## 2024-12-16 DIAGNOSIS — K760 Fatty (change of) liver, not elsewhere classified: Secondary | ICD-10-CM

## 2024-12-16 DIAGNOSIS — R7303 Prediabetes: Secondary | ICD-10-CM | POA: Diagnosis not present

## 2024-12-16 LAB — HM MAMMOGRAPHY

## 2024-12-16 MED ORDER — WEGOVY 0.25 MG/0.5ML ~~LOC~~ SOAJ: 0.2500 mg | SUBCUTANEOUS | 0 refills | Status: DC

## 2024-12-16 MED ORDER — WEGOVY 0.25 MG/0.5ML ~~LOC~~ SOAJ: 0.2500 mg | SUBCUTANEOUS | 0 refills | Status: AC

## 2024-12-16 NOTE — Assessment & Plan Note (Signed)
 Comorbidities of HTN, HLD, NASH, OSA, and pre-diabetes present. Fatty liver disease identified on imaging in 2021. Recent elevation in liver enzymes. Weight management is crucial to prevent progression. Elevated cholesterol levels with a high cholesterol to HDL ratio, indicating increased cardiovascular risk. Weight management is important to improve lipid profile. Hemoglobin A1c of 6.0, previously as high as 6.1. Weight management is important to prevent progression to diabetes. Long standing history of OSA. Weight management crucial for improved control and CV risk reduction as well as quality of life. Class 3 obesity with a BMI of 41.98, previously 45.53. Chronic difficulty managing weight with diet and exercise alone, along with several medication therapies and weight medical management education and follow-up. Weight loss achieved from 257 lbs to 237 lbs with GLP-1 therapy, diet, and exercise. Phentermine  caused tachycardia and palpitations. Interested in restarting Wegovy  or Zepbound  with new insurance coverage starting January 1st to aid in overall cardiovascular risk reduction in the setting of obesity and several serious, chronic health conditions.   - Will submit [facility] with indication for chronic conditions - Diet and exercise must be a formal part of this routine for successful management and risk reduction.  - Focus on weight management to reduce chronic health conditions and overall risks associated with progression.       Orders:   semaglutide -weight management (WEGOVY ) 0.25 MG/0.5ML SOAJ SQ injection; Inject 0.25 mg into the skin once a week.

## 2024-12-16 NOTE — Assessment & Plan Note (Addendum)
 Comorbidities of HTN, HLD, NASH, OSA, and pre-diabetes present. Fatty liver disease identified on imaging in 2021. Recent elevation in liver enzymes. Weight management is crucial to prevent progression. Elevated cholesterol levels with a high cholesterol to HDL ratio, indicating increased cardiovascular risk. Weight management is important to improve lipid profile. Hemoglobin A1c of 6.0, previously as high as 6.1. Weight management is important to prevent progression to diabetes. Long standing history of OSA. Weight management crucial for improved control and CV risk reduction as well as quality of life. Class 3 obesity with a BMI of 41.98, previously 45.53. Chronic difficulty managing weight with diet and exercise alone, along with several medication therapies and weight medical management education and follow-up. Weight loss achieved from 257 lbs to 237 lbs with GLP-1 therapy, diet, and exercise. Phentermine  caused tachycardia and palpitations. Interested in restarting Wegovy  or Zepbound  with new insurance coverage starting January 1st to aid in overall cardiovascular risk reduction in the setting of obesity and several serious, chronic health conditions.   - Will submit [facility] with indication for chronic conditions - Diet and exercise must be a formal part of this routine for successful management and risk reduction.  - Focus on weight management to reduce chronic health conditions and overall risks associated with progression.       Orders:   semaglutide -weight management (WEGOVY ) 0.25 MG/0.5ML SOAJ SQ injection; Inject 0.25 mg into the skin once a week.

## 2024-12-16 NOTE — Patient Instructions (Signed)
 WEIGHT LOSS PLANNING Your progress today shows:     12/16/2024   10:06 AM 10/31/2024   11:33 AM 09/13/2024    9:42 AM  Vitals with BMI  Height   5' 3  Weight 237 lbs 240 lbs 10 oz 245 lbs 3 oz  BMI   43.45  Systolic 127 110 875  Diastolic 83 70 82  Pulse  75 80    For best management of weight, it is vital to balance intake versus output. This means the number of calories burned per day must be less than the calories you take in with food and drink.   I recommend trying to follow a diet with the following: Calories: 1200-1500 calories per day Carbohydrates: 150-180 grams of carbohydrates per day  Why: Gives your body enough quick fuel for cells to maintain normal function without sending them into starvation mode.  Protein: At least 90 grams of protein per day- 30 grams with each meal Why: Protein takes longer and uses more energy than carbohydrates to break down for fuel. The carbohydrates in your meals serves as quick energy sources and proteins help use some of that extra quick energy to break down to produce long term energy. This helps you not feel hungry as quickly and protein breakdown burns calories.  Water: Drink AT LEAST 64 ounces of water per day  Why: Water is essential to healthy metabolism. Water helps to fill the stomach and keep you fuller longer. Water is required for healthy digestion and filtering of waste in the body.  Fat: Limit fats in your diet- when choosing fats, choose foods with lower fats content such as lean meats (chicken, fish, turkey).  Why: Increased fat intake leads to storage for later. Once you burn your carbohydrate energy, your body goes into fat and protein breakdown mode to help you loose weight.  Cholesterol: Fats and oils that are LIQUID at room temperature are best. Choose vegetable oils (olive oil, avocado oil, nuts). Avoid fats that are SOLID at room temperature (animal fats, processed meats). Healthy fats are often found in whole grains,  beans, nuts, seeds, and berries.  Why: Elevated cholesterol levels lead to build up of cholesterol on the inside of your blood vessels. This will eventually cause the blood vessels to become hard and can lead to high blood pressure and damage to your organs. When the blood flow is reduced, but the pressure is high from cholesterol buildup, parts of the cholesterol can break off and form clots that can go to the brain or heart leading to a stroke or heart attack.  Fiber: Increase amount of SOLUBLE the fiber in your diet. This helps to fill you up, lowers cholesterol, and helps with digestion. Some foods high in soluble fiber are oats, peas, beans, apples, carrots, barley, and citrus fruits.   Why: Fiber fills you up, helps remove excess cholesterol, and aids in healthy digestion which are all very important in weight management.   I recommend the following as a minimum activity routine: Purposeful walk or other physical activity at least 20 minutes every single day. This means purposefully taking a walk, jog, bike, swim, treadmill, elliptical, dance, etc.  This activity should be ABOVE your normal daily activities, such as walking at work. Goal exercise should be at least 150 minutes a week- work your way up to this.   Heart Rate: Your maximum exercise heart rate should be 220 - Your Age in Years. When exercising, get your heart rate up, but  avoid going over the maximum targeted heart rate.  60-70% of your maximum heart rate is where you tend to burn the most fat. To find this number:  220 - Age In Years= Max HR  Max HR x 0.6 (or 0.7) = Fat Burning HR The Fat Burning HR is your goal heart rate while working out to burn the most fat.  NEVER exercise to the point your feel lightheaded, weak, nauseated, dizzy. If you experience ANY of these symptoms- STOP exercise! Allow yourself to cool down and your heart rate to come down. Then restart slower next time.  If at ANY TIME you feel chest pain or chest  pressure during exercise, STOP IMMEDIATELY and seek medical attention.

## 2024-12-16 NOTE — Progress Notes (Signed)
 Virtual Visit Encounter mychart visit.   I connected with  Vanessa Copeland on 12/16/2024 at 10:15 AM EST by secure video and audio telemedicine application. I verified that I am speaking with the correct person using two identifiers.   I introduced myself as a Publishing Rights Manager with the practice. The limitations of evaluation and management by telemedicine discussed with the patient and the availability of in person appointments. The patient expressed verbal understanding and consent to proceed.  Participating parties in this visit include: Myself and patient  The patient is: Patient Location: Home I am: Provider Location: Office/Clinic Subjective:    CC and HPI:  History of Present Illness Vanessa Copeland is a 58 year old female with hepatosteatosis, aortic atherosclerosis, OSA, hypertension, hyperlipidemia, and prediabetes who presents for discussion about medication management for her chronic conditions to help reduce her overall cardiovascular risks.   She has a history of obesity with significant difficulty with management despite diet, exercise, and medication management. In 2021 she was diagnosed with fatty liver, found incidentally on CT imaging (02/10/2020) and has tried aggressive management since that time with some effectiveness noted with specific medications. In the past, saxenda , wellbutrin , metformin , and naltrexone  were not helpful with control of her weight and she continued to have trending elevation in her lipids and hemoglobin A1c. Phentermine  has been moderately helpful, but has caused palpitations and tachycardia, which were not tolerable. She had the most success with weight loss and control of her chronic conditions with Mounjaro  and later Wegovy .    Since December 2024, and she has managed to reduce her weight from 257 pounds to 237 pounds, resulting in a drop in BMI from 45.53 to her current BMI of 41.98. Her initial success was due to Wegovy , but this medication was  suddenly dropped from her insurance a few months ago and her weight loss halted. She re-tried phentermine  along with diet and exercise, but has not had success at ongoing weight management, although, she has managed to avoid gaining all of her weight back.   She has had weight management education completed with [facility] with dietary and exercise plans for lowered risks.     She expresses concern over ongoing weight and the effect this has on her cardiovascular health and the impact this may have on her fatty liver and sleep apnea. Since stopping Wegovy , her A1c has steadily increased and she had to be restarted on statin therapy for worsening trends with hyperlipidemia. She has also had to start medication for hypertension. She is hopeful that with her new insurance at the beginning of the year she will be approved for coverage of Wegovy  or Mounjaro  to help lower her cardiovascular risks and aid in overall better control of her blood pressure, lipids, blood sugar, and weight.   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    Past medical history, Surgical history, Family history not pertinant except as noted below, Social history, Allergies, and medications have been entered into the medical record, reviewed, and corrections made.   Review of Systems:  All review of systems negative except what is listed in the HPI  Objective:    Alert and oriented x 4 Speaking in clear sentences with no shortness of breath. No distress.  Impression and Recommendations:    Assessment & Plan NAFLD (nonalcoholic fatty liver disease) Primary hypertension Class 3 severe obesity due to excess calories with serious comorbidity and  body mass index (BMI) of 45.0 to 49.9 in adult [facility]) Hyperlipidemia, pure Prediabetes Obstructive sleep apnea of adult Comorbidities of HTN, HLD, NASH, OSA, and pre-diabetes present. Fatty  liver disease identified on imaging in 2021. Recent elevation in liver enzymes. Weight management is crucial to prevent progression. Elevated cholesterol levels with a high cholesterol to HDL ratio, indicating increased cardiovascular risk. Weight management is important to improve lipid profile. Hemoglobin A1c of 6.0, previously as high as 6.1. Weight management is important to prevent progression to diabetes. Long standing history of OSA. Weight management crucial for improved control and CV risk reduction as well as quality of life. Class 3 obesity with a BMI of 41.98, previously 45.53. Chronic difficulty managing weight with diet and exercise alone, along with several medication therapies and weight medical management education and follow-up. Weight loss achieved from 257 lbs to 237 lbs with GLP-1 therapy, diet, and exercise. Phentermine  caused tachycardia and palpitations. Interested in restarting Wegovy  or Zepbound  with new insurance coverage starting January 1st to aid in overall cardiovascular risk reduction in the setting of obesity and several serious, chronic health conditions.   - Will submit [facility] with indication for chronic conditions - Diet and exercise must be a formal part of this routine for successful management and risk reduction.  - Focus on weight management to reduce chronic health conditions and overall risks associated with progression.       Orders:   semaglutide -weight management (WEGOVY ) 0.25 MG/0.5ML SOAJ SQ injection; Inject 0.25 mg into the skin once a week.      orders and follow up as documented in EMR I discussed the assessment and treatment plan with the patient. The patient was provided an opportunity to ask questions and all were answered. The patient agreed with the plan and demonstrated an understanding of the instructions.   The patient was advised to call back or seek an in-person evaluation if the symptoms worsen or if the condition fails to improve as  anticipated.  Follow-Up: in 3 months  I provided 25 minutes of non-face-to-face interaction with this non face-to-face encounter including intake, same-day documentation, and chart review.   Camie CHARLENA Doing, NP , DNP, AGNP-c [facility]

## 2024-12-22 ENCOUNTER — Encounter: Payer: Self-pay | Admitting: Nurse Practitioner

## 2024-12-23 ENCOUNTER — Other Ambulatory Visit (HOSPITAL_COMMUNITY): Payer: Self-pay

## 2024-12-26 ENCOUNTER — Other Ambulatory Visit (HOSPITAL_COMMUNITY): Payer: Self-pay

## 2024-12-26 ENCOUNTER — Telehealth: Payer: Self-pay | Admitting: Pharmacy Technician

## 2024-12-26 NOTE — Telephone Encounter (Signed)
 PA request has been Received. New Encounter has been or will be created for follow up. For additional info see Pharmacy Prior Auth telephone encounter from 12/26/24.

## 2024-12-27 ENCOUNTER — Other Ambulatory Visit (HOSPITAL_COMMUNITY): Payer: Self-pay

## 2024-12-27 NOTE — Telephone Encounter (Signed)
 Pharmacy Patient Advocate Encounter   Received notification from Patient Advice Request messages that prior authorization for Wegovy  0.25mg  is required/requested.   Insurance verification completed.   The patient is insured through [facility].   Per test claim: PA required - Called due to not being able to submit through [facility]. Was transferred to [employer] who handles their other PAs, but not Wegovy . [facility] does their own.  Physician will need to send in a letter of medical Necessity and a plan of treatment via fax to attn [facility] [phone] She did say they are 6-8 weeks behind, so it will likely be a while before they get a response.

## 2025-01-03 ENCOUNTER — Inpatient Hospital Stay: Payer: Self-pay | Attending: Oncology | Admitting: Oncology

## 2025-01-03 ENCOUNTER — Telehealth: Payer: Self-pay

## 2025-01-03 ENCOUNTER — Inpatient Hospital Stay: Payer: Self-pay

## 2025-01-03 NOTE — Telephone Encounter (Signed)
 Noticed patient had not arrived for 11:15 lab appointment or or 11:45 provider visit. Called and spoke with patient who stated her husband had shoulder surgery yesterday and she would need to reschedule these appointments. Message sent to scheduler to reach out to patient directly to reschedule appointments.

## 2025-01-04 ENCOUNTER — Telehealth: Payer: Self-pay | Admitting: Oncology

## 2025-01-09 ENCOUNTER — Inpatient Hospital Stay: Admitting: Oncology

## 2025-01-09 ENCOUNTER — Inpatient Hospital Stay

## 2025-01-09 VITALS — BP 113/71 | HR 72 | Temp 98.1°F | Resp 18 | Ht 63.0 in | Wt 242.1 lb

## 2025-01-09 DIAGNOSIS — D500 Iron deficiency anemia secondary to blood loss (chronic): Secondary | ICD-10-CM

## 2025-01-09 DIAGNOSIS — D649 Anemia, unspecified: Secondary | ICD-10-CM

## 2025-01-09 DIAGNOSIS — D696 Thrombocytopenia, unspecified: Secondary | ICD-10-CM

## 2025-01-09 LAB — CBC WITH DIFFERENTIAL (CANCER CENTER ONLY)
Abs Immature Granulocytes: 0.01 K/uL (ref 0.00–0.07)
Basophils Absolute: 0 K/uL (ref 0.0–0.1)
Basophils Relative: 0 %
Eosinophils Absolute: 0.2 K/uL (ref 0.0–0.5)
Eosinophils Relative: 2 %
HCT: 34.7 % — ABNORMAL LOW (ref 36.0–46.0)
Hemoglobin: 11.5 g/dL — ABNORMAL LOW (ref 12.0–15.0)
Immature Granulocytes: 0 %
Lymphocytes Relative: 43 %
Lymphs Abs: 3.1 K/uL (ref 0.7–4.0)
MCH: 29 pg (ref 26.0–34.0)
MCHC: 33.1 g/dL (ref 30.0–36.0)
MCV: 87.4 fL (ref 80.0–100.0)
Monocytes Absolute: 0.5 K/uL (ref 0.1–1.0)
Monocytes Relative: 8 %
Neutro Abs: 3.3 K/uL (ref 1.7–7.7)
Neutrophils Relative %: 47 %
Platelet Count: 384 K/uL (ref 150–400)
RBC: 3.97 MIL/uL (ref 3.87–5.11)
RDW: 15.1 % (ref 11.5–15.5)
WBC Count: 7.1 K/uL (ref 4.0–10.5)
nRBC: 0 % (ref 0.0–0.2)

## 2025-01-09 LAB — IRON AND TIBC
Iron: 45 ug/dL (ref 28–170)
Saturation Ratios: 12 % (ref 10.4–31.8)
TIBC: 367 ug/dL (ref 250–450)
UIBC: 322 ug/dL

## 2025-01-09 LAB — FERRITIN: Ferritin: 53 ng/mL (ref 11–307)

## 2025-01-09 NOTE — Progress Notes (Signed)
" °  [facility] OFFICE PROGRESS NOTE   Diagnosis: Thrombocytopenia  INTERVAL HISTORY:   Ms. Colberg returns as scheduled.  She feels well.  No complaint.  No bleeding.  She did not return stool Hemoccult cards as ordered in July.  She has not followed up with urology.  Objective:  Vital signs in last 24 hours:  Blood pressure 113/71, pulse 72, temperature 98.1 F (36.7 C), temperature source Temporal, resp. rate 18, height 5' 3 (1.6 m), weight 242 lb 1.6 oz (109.8 kg), SpO2 100%.  Lymphatics: No cervical, supraclavicular, axillary, or inguinal nodes Resp: Lungs clear bilaterally Cardio: Regular rate and rhythm GI: No hepatosplenomegaly, no mass, nontender Vascular: No leg edema   Lab Results:  Lab Results  Component Value Date   WBC 7.1 01/09/2025   HGB 11.5 (L) 01/09/2025   HCT 34.7 (L) 01/09/2025   MCV 87.4 01/09/2025   PLT 384 01/09/2025   NEUTROABS 3.3 01/09/2025    CMP  Lab Results  Component Value Date   NA 139 09/13/2024   K 4.2 09/13/2024   CL 98 09/13/2024   CO2 25 09/13/2024   GLUCOSE 86 09/13/2024   BUN 17 09/13/2024   CREATININE 0.86 09/13/2024   CALCIUM  9.9 09/13/2024   PROT 7.3 09/13/2024   ALBUMIN 4.5 09/13/2024   AST 21 09/13/2024   ALT 33 (H) 09/13/2024   ALKPHOS 134 09/13/2024   BILITOT 0.5 09/13/2024   GFRNONAA >60 12/02/2022   GFRAA 96 11/27/2020     Medications: I have reviewed the patient's current medications.   Assessment/Plan:  Thrombocytopenia Mild anemia Microscopic hematuria  History of kidney stones Asthma Postprandial bowel movements-improved 07/01/2024.    Disposition: Ms. Moro has a history of thrombocytopenia.  The platelet count remains in the normal range.  She has chronic mild anemia.  She was noted to have hematuria on urinalyses last year.  She relates the microscopic hematuria to kidney stones .  I recommended she follow-up with urology to evaluate the hematuria.  She will return stool  Hemoccult cards.  Ms. Bores would like to continue follow-up in the hematology clinic.  She will return for an office and lab visit in 9 months.  We are available to see her sooner as needed.  Arley Hof, MD  01/09/2025  11:28 AM   "

## 2025-01-11 ENCOUNTER — Other Ambulatory Visit: Payer: Self-pay | Admitting: Nurse Practitioner

## 2025-01-11 DIAGNOSIS — E7849 Other hyperlipidemia: Secondary | ICD-10-CM

## 2025-01-12 ENCOUNTER — Other Ambulatory Visit (HOSPITAL_COMMUNITY): Payer: Self-pay

## 2025-01-16 ENCOUNTER — Other Ambulatory Visit (HOSPITAL_COMMUNITY): Payer: Self-pay

## 2025-01-18 ENCOUNTER — Other Ambulatory Visit: Payer: Self-pay | Admitting: Nurse Practitioner

## 2025-01-18 NOTE — Progress Notes (Signed)
 Done

## 2025-10-09 ENCOUNTER — Inpatient Hospital Stay: Admitting: Oncology

## 2025-10-09 ENCOUNTER — Inpatient Hospital Stay
